# Patient Record
Sex: Female | Born: 1940 | Race: Black or African American | Hispanic: No | Smoking: Never smoker
Health system: Southern US, Community
[De-identification: ages and names within clinical notes are randomized; demographics above are authoritative.]

## PROBLEM LIST (undated history)

## (undated) DIAGNOSIS — Z9289 Personal history of other medical treatment: Secondary | ICD-10-CM

## (undated) DIAGNOSIS — I10 Essential (primary) hypertension: Secondary | ICD-10-CM

## (undated) DIAGNOSIS — J449 Chronic obstructive pulmonary disease, unspecified: Secondary | ICD-10-CM

## (undated) DIAGNOSIS — E119 Type 2 diabetes mellitus without complications: Secondary | ICD-10-CM

## (undated) DIAGNOSIS — E11610 Type 2 diabetes mellitus with diabetic neuropathic arthropathy: Secondary | ICD-10-CM

## (undated) DIAGNOSIS — Z7901 Long term (current) use of anticoagulants: Secondary | ICD-10-CM

## (undated) DIAGNOSIS — C801 Malignant (primary) neoplasm, unspecified: Secondary | ICD-10-CM

## (undated) DIAGNOSIS — I639 Cerebral infarction, unspecified: Secondary | ICD-10-CM

## (undated) DIAGNOSIS — R0602 Shortness of breath: Secondary | ICD-10-CM

## (undated) DIAGNOSIS — F419 Anxiety disorder, unspecified: Secondary | ICD-10-CM

## (undated) DIAGNOSIS — J189 Pneumonia, unspecified organism: Secondary | ICD-10-CM

## (undated) DIAGNOSIS — J45909 Unspecified asthma, uncomplicated: Secondary | ICD-10-CM

## (undated) DIAGNOSIS — G9340 Encephalopathy, unspecified: Secondary | ICD-10-CM

## (undated) DIAGNOSIS — I509 Heart failure, unspecified: Secondary | ICD-10-CM

## (undated) DIAGNOSIS — D696 Thrombocytopenia, unspecified: Secondary | ICD-10-CM

## (undated) DIAGNOSIS — M109 Gout, unspecified: Secondary | ICD-10-CM

## (undated) HISTORY — DX: Gout, unspecified: M10.9

## (undated) HISTORY — PX: OTHER SURGICAL HISTORY: SHX169

## (undated) HISTORY — PX: TUBAL LIGATION: SHX77

## (undated) HISTORY — DX: Long term (current) use of anticoagulants: Z79.01

---

## 2001-03-04 ENCOUNTER — Other Ambulatory Visit: Admission: RE | Admit: 2001-03-04 | Discharge: 2001-03-04 | Payer: Self-pay | Admitting: Obstetrics and Gynecology

## 2001-03-18 ENCOUNTER — Encounter: Payer: Self-pay | Admitting: Obstetrics and Gynecology

## 2001-03-18 ENCOUNTER — Ambulatory Visit (HOSPITAL_COMMUNITY): Admission: RE | Admit: 2001-03-18 | Discharge: 2001-03-18 | Payer: Self-pay | Admitting: Internal Medicine

## 2002-03-18 ENCOUNTER — Encounter: Payer: Self-pay | Admitting: Obstetrics and Gynecology

## 2002-03-18 ENCOUNTER — Ambulatory Visit (HOSPITAL_COMMUNITY): Admission: RE | Admit: 2002-03-18 | Discharge: 2002-03-18 | Payer: Self-pay | Admitting: Obstetrics and Gynecology

## 2002-04-02 ENCOUNTER — Ambulatory Visit (HOSPITAL_COMMUNITY): Admission: RE | Admit: 2002-04-02 | Discharge: 2002-04-02 | Payer: Self-pay | Admitting: Obstetrics and Gynecology

## 2002-04-02 ENCOUNTER — Encounter: Payer: Self-pay | Admitting: Obstetrics and Gynecology

## 2002-04-04 ENCOUNTER — Ambulatory Visit (HOSPITAL_COMMUNITY): Admission: RE | Admit: 2002-04-04 | Discharge: 2002-04-04 | Payer: Self-pay | Admitting: Internal Medicine

## 2002-04-04 HISTORY — PX: COLONOSCOPY: SHX174

## 2003-04-06 ENCOUNTER — Ambulatory Visit (HOSPITAL_COMMUNITY): Admission: RE | Admit: 2003-04-06 | Discharge: 2003-04-06 | Payer: Self-pay | Admitting: Obstetrics and Gynecology

## 2003-04-17 ENCOUNTER — Encounter: Admission: RE | Admit: 2003-04-17 | Discharge: 2003-04-17 | Payer: Self-pay | Admitting: Obstetrics and Gynecology

## 2003-04-17 ENCOUNTER — Encounter (INDEPENDENT_AMBULATORY_CARE_PROVIDER_SITE_OTHER): Payer: Self-pay | Admitting: *Deleted

## 2003-05-06 ENCOUNTER — Ambulatory Visit (HOSPITAL_COMMUNITY): Admission: RE | Admit: 2003-05-06 | Discharge: 2003-05-06 | Payer: Self-pay | Admitting: General Surgery

## 2003-05-23 HISTORY — PX: BREAST SURGERY: SHX581

## 2003-05-27 ENCOUNTER — Encounter: Admission: RE | Admit: 2003-05-27 | Discharge: 2003-05-27 | Payer: Self-pay | Admitting: Oncology

## 2003-05-27 ENCOUNTER — Encounter (HOSPITAL_COMMUNITY): Admission: RE | Admit: 2003-05-27 | Discharge: 2003-06-26 | Payer: Self-pay | Admitting: Oncology

## 2003-06-03 ENCOUNTER — Observation Stay (HOSPITAL_COMMUNITY): Admission: RE | Admit: 2003-06-03 | Discharge: 2003-06-04 | Payer: Self-pay | Admitting: General Surgery

## 2003-07-14 ENCOUNTER — Ambulatory Visit: Admission: RE | Admit: 2003-07-14 | Discharge: 2003-10-12 | Payer: Self-pay | Admitting: *Deleted

## 2003-11-09 ENCOUNTER — Encounter: Admission: RE | Admit: 2003-11-09 | Discharge: 2003-11-09 | Payer: Self-pay | Admitting: Oncology

## 2003-11-09 ENCOUNTER — Encounter (HOSPITAL_COMMUNITY): Admission: RE | Admit: 2003-11-09 | Discharge: 2003-12-09 | Payer: Self-pay | Admitting: Oncology

## 2004-05-10 ENCOUNTER — Encounter (HOSPITAL_COMMUNITY): Admission: RE | Admit: 2004-05-10 | Discharge: 2004-05-20 | Payer: Self-pay | Admitting: Oncology

## 2004-05-10 ENCOUNTER — Encounter: Admission: RE | Admit: 2004-05-10 | Discharge: 2004-05-20 | Payer: Self-pay | Admitting: Oncology

## 2004-05-10 ENCOUNTER — Ambulatory Visit (HOSPITAL_COMMUNITY): Payer: Self-pay | Admitting: Oncology

## 2004-05-17 ENCOUNTER — Encounter (HOSPITAL_COMMUNITY): Admission: RE | Admit: 2004-05-17 | Discharge: 2004-05-21 | Payer: Self-pay | Admitting: Oncology

## 2004-05-24 ENCOUNTER — Encounter (HOSPITAL_COMMUNITY): Admission: RE | Admit: 2004-05-24 | Discharge: 2004-06-29 | Payer: Self-pay | Admitting: Oncology

## 2004-05-25 ENCOUNTER — Encounter (HOSPITAL_COMMUNITY): Admission: RE | Admit: 2004-05-25 | Discharge: 2004-06-24 | Payer: Self-pay | Admitting: Oncology

## 2004-11-01 ENCOUNTER — Ambulatory Visit (HOSPITAL_COMMUNITY): Payer: Self-pay | Admitting: Oncology

## 2004-11-01 ENCOUNTER — Encounter (HOSPITAL_COMMUNITY): Admission: RE | Admit: 2004-11-01 | Discharge: 2004-12-01 | Payer: Self-pay | Admitting: Oncology

## 2004-11-01 ENCOUNTER — Encounter: Admission: RE | Admit: 2004-11-01 | Discharge: 2004-11-01 | Payer: Self-pay | Admitting: Oncology

## 2004-12-07 ENCOUNTER — Ambulatory Visit (HOSPITAL_COMMUNITY): Admission: RE | Admit: 2004-12-07 | Discharge: 2004-12-07 | Payer: Self-pay | Admitting: Obstetrics and Gynecology

## 2004-12-08 ENCOUNTER — Encounter (INDEPENDENT_AMBULATORY_CARE_PROVIDER_SITE_OTHER): Payer: Self-pay | Admitting: Specialist

## 2004-12-08 ENCOUNTER — Encounter: Admission: RE | Admit: 2004-12-08 | Discharge: 2004-12-08 | Payer: Self-pay | Admitting: Obstetrics and Gynecology

## 2005-06-02 ENCOUNTER — Ambulatory Visit (HOSPITAL_COMMUNITY): Admission: RE | Admit: 2005-06-02 | Discharge: 2005-06-02 | Payer: Self-pay | Admitting: Oncology

## 2005-06-05 ENCOUNTER — Ambulatory Visit (HOSPITAL_COMMUNITY): Payer: Self-pay | Admitting: Oncology

## 2005-06-05 ENCOUNTER — Encounter: Admission: RE | Admit: 2005-06-05 | Discharge: 2005-06-05 | Payer: Self-pay | Admitting: Oncology

## 2005-06-05 ENCOUNTER — Encounter (HOSPITAL_COMMUNITY): Admission: RE | Admit: 2005-06-05 | Discharge: 2005-07-05 | Payer: Self-pay | Admitting: Oncology

## 2005-11-29 ENCOUNTER — Ambulatory Visit (HOSPITAL_COMMUNITY): Admission: RE | Admit: 2005-11-29 | Discharge: 2005-11-29 | Payer: Self-pay | Admitting: Obstetrics and Gynecology

## 2006-01-16 ENCOUNTER — Encounter: Admission: RE | Admit: 2006-01-16 | Discharge: 2006-01-16 | Payer: Self-pay | Admitting: Oncology

## 2006-01-16 ENCOUNTER — Ambulatory Visit (HOSPITAL_COMMUNITY): Payer: Self-pay | Admitting: Oncology

## 2006-01-16 ENCOUNTER — Encounter (HOSPITAL_COMMUNITY): Admission: RE | Admit: 2006-01-16 | Discharge: 2006-02-15 | Payer: Self-pay | Admitting: Oncology

## 2006-07-23 ENCOUNTER — Ambulatory Visit (HOSPITAL_COMMUNITY): Payer: Self-pay | Admitting: Oncology

## 2006-12-05 ENCOUNTER — Ambulatory Visit (HOSPITAL_COMMUNITY): Admission: RE | Admit: 2006-12-05 | Discharge: 2006-12-05 | Payer: Self-pay | Admitting: Obstetrics and Gynecology

## 2006-12-24 ENCOUNTER — Ambulatory Visit (HOSPITAL_COMMUNITY): Payer: Self-pay | Admitting: Oncology

## 2007-03-25 ENCOUNTER — Other Ambulatory Visit: Admission: RE | Admit: 2007-03-25 | Discharge: 2007-03-25 | Payer: Self-pay | Admitting: Obstetrics and Gynecology

## 2007-04-09 ENCOUNTER — Encounter: Admission: RE | Admit: 2007-04-09 | Discharge: 2007-04-09 | Payer: Self-pay | Admitting: Internal Medicine

## 2007-10-21 ENCOUNTER — Ambulatory Visit (HOSPITAL_COMMUNITY): Payer: Self-pay | Admitting: Oncology

## 2007-12-09 ENCOUNTER — Encounter (HOSPITAL_COMMUNITY): Admission: RE | Admit: 2007-12-09 | Discharge: 2008-01-08 | Payer: Self-pay | Admitting: Oncology

## 2008-03-17 ENCOUNTER — Other Ambulatory Visit: Admission: RE | Admit: 2008-03-17 | Discharge: 2008-03-17 | Payer: Self-pay | Admitting: Obstetrics & Gynecology

## 2008-06-17 ENCOUNTER — Ambulatory Visit (HOSPITAL_COMMUNITY): Payer: Self-pay | Admitting: Oncology

## 2008-12-17 ENCOUNTER — Ambulatory Visit (HOSPITAL_COMMUNITY): Admission: RE | Admit: 2008-12-17 | Discharge: 2008-12-17 | Payer: Self-pay | Admitting: Obstetrics and Gynecology

## 2009-02-08 ENCOUNTER — Ambulatory Visit: Payer: Self-pay | Admitting: Cardiology

## 2009-03-22 ENCOUNTER — Other Ambulatory Visit: Admission: RE | Admit: 2009-03-22 | Discharge: 2009-03-22 | Payer: Self-pay | Admitting: Obstetrics & Gynecology

## 2009-07-07 ENCOUNTER — Encounter (HOSPITAL_COMMUNITY): Admission: RE | Admit: 2009-07-07 | Discharge: 2009-08-06 | Payer: Self-pay | Admitting: Oncology

## 2009-07-07 ENCOUNTER — Ambulatory Visit (HOSPITAL_COMMUNITY): Payer: Self-pay | Admitting: Oncology

## 2009-12-22 ENCOUNTER — Ambulatory Visit (HOSPITAL_COMMUNITY): Admission: RE | Admit: 2009-12-22 | Discharge: 2009-12-22 | Payer: Self-pay | Admitting: Obstetrics and Gynecology

## 2010-03-29 ENCOUNTER — Other Ambulatory Visit: Admission: RE | Admit: 2010-03-29 | Discharge: 2010-03-29 | Payer: Self-pay | Admitting: Obstetrics & Gynecology

## 2010-06-11 ENCOUNTER — Encounter: Payer: Self-pay | Admitting: Obstetrics and Gynecology

## 2010-06-12 ENCOUNTER — Encounter (HOSPITAL_COMMUNITY): Payer: Self-pay | Admitting: Oncology

## 2010-07-06 ENCOUNTER — Ambulatory Visit (HOSPITAL_COMMUNITY): Payer: Self-pay | Admitting: Oncology

## 2010-10-07 NOTE — Op Note (Signed)
NAME:  Brittany Clarke, Brittany Clarke                      ACCOUNT NO.:  [id number]   MEDICAL RECORD NO.:  [id number]                   PATIENT TYPE:  OBV   LOCATION:  A403                                 FACILITY:  [facility]   PHYSICIAN:  Barbaraann Barthel, M.D.              DATE OF BIRTH:  13-May-1941   DATE OF PROCEDURE:  06/03/2003  DATE OF DISCHARGE:                                 OPERATIVE REPORT   PREOPERATIVE DIAGNOSIS:  Carcinoma of the right breast.   POSTOPERATIVE DIAGNOSIS:  Carcinoma of the right breast.   PROCEDURE:  Sentinel lymph node biopsy and right axillary dissection.   SURGEON:  Barbaraann Barthel, M.D.   NOTE:  This is a 70 year old black female who was noted to have carcinoma in  situ by a core biopsy.  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  The margins were clear.  Sentinel lymph node biopsy was  requested.  We had discussed the procedure, in detail, with the patient  including complications, not limited to, but including:  Bleeding,  infection, allergic reaction, and the possibility that further surgery might  be required.  Informed consent was obtained.   GROSS OPERATIVE FINDINGS:  The patient had very difficult axilla to work  with due to her morbid obesity; however, we did obtain nodes by the sentinel  lymph node procedure that were negative for carcinoma.  The patient also had  an axillary dissection as there was some question in my mind whether or not  that the sentinel lymph nodes were accurate because we were having some  trouble with the neoprobe device.   SPECIMEN:  Axillary dissection and 4 different specimens. One containing 3  nodes and the others containing at least a node apiece; all of which were  negative for carcinoma.   TECHNIQUE:  The patient was placed in the supine position and after the  adequate administration of general anesthesia via endotracheal  intubation  her entire axilla was prepped with Betadine solution and draped in the usual  manner.  A limb isolator device was used.  Approximately 2 hours prior to  the procedure the patient had been injected in radiology with the  radioactive material; approximately 5 minutes before dissection the patient  had 5 cc of blue dye injected circumferentially, intradermally around the  biopsy site and this was massaged for approximately 5 minutes.  We then used  the neoprobe device in the axilla.  We did not find good counts in this area  due to her anatomy.  It was difficult to obtain good counts; however, we did  find a skin count of 220 x 10 to the third as a skin count and found lymph  nodes which registered 601 x10 to the third X vivo.  I removed these.  These  were sent for frozen section and found to be  negative. I then removed  further axillary tissue as I palpated some rather large nodes and I removed  these separately.  These were all negative for carcinoma as well.   After terminating the procedure, the wound was then irrigated with sterile  water solution and a Jackson-Pratt drain was placed in the right axilla via  a separate stab wound incision and the 3 incisions made in the right axilla  were  then closed with a stapling device.  Prior to closure all sponge, needle,  and instrument counts were found to be correct.  Estimated blood loss was  minimal.  The patient received approximately 1 liter of crystalloids  intraoperatively.  There were no complications.      ___________________________________________                                            Barbaraann Barthel, M.D.   WB/MEDQ  D:  06/03/2003  T:  06/03/2003  Job:  [id number]   cc:   Barbaraann Barthel, M.D.  [address]  [place]  [place] [address]  Fax: [phone]   Ladona Horns. Neijstrom, MD  [address]  [place]  [place] [address]  Fax: [phone]

## 2010-10-07 NOTE — Op Note (Signed)
NAME:  Brittany Clarke, Brittany Clarke                      ACCOUNT NO.:  [id number]   MEDICAL RECORD NO.:  [id number]                   PATIENT TYPE:  AMB   LOCATION:  DAY                                  FACILITY:  [facility]   PHYSICIAN:  R. Roetta Sessions, M.D.              DATE OF BIRTH:  02-26-1941   DATE OF PROCEDURE:  04/04/2002  DATE OF DISCHARGE:                                 OPERATIVE REPORT   PROCEDURE:  Colonoscopy with biopsy.   INDICATIONS FOR PROCEDURE:  The patient is a 70 year old lady sent over  through the courtesy of Dr. Christin Bach and associates for colorectal  cancer screening. She is devoid any lower GI tract symptoms. No prior  imaging of her colon and she has no family history of colorectal cancer. She  is followed primarily by Dr. Vivien Rossetti in [place]. Colonoscopy is now  being done as a standard screening maneuver. This approach has been  discussed with the patient at length. The potential risks, benefits, and  alternatives have been reviewed, questions answered. She is agreeable.  Please see my handwritten H&P for more information. ASA II.   MONITORING:  O2 saturation, blood pressure, pulse and respirations were  monitored throughout the entire procedure.   CONSCIOUS SEDATION:  Versed 4 mg IV, Demerol 75 mg in divided doses.   INSTRUMENT:  Olympus video chip adult colonoscope.   FINDINGS:  Digital rectal exam revealed no abnormalities.   ENDOSCOPIC FINDINGS:  The prep was good.   RECTUM:  Examination of the rectal mucosa including retroflexed view of the  anal verge revealed no internal hemorrhoids.   COLON:  The colonic mucosa was surveyed from the rectosigmoid junction  through the left transverse right colon to the area of the appendiceal  orifice, ileocecal valve and cecum. These structures were well seen and  photographed for the record. The ileocecal valve had a somewhat adenomatous  appearance but no definite polyp or neoplastic process was seen. This  may be  some eversion of the terminal ileum. I elected to biopsy this area. From  this level, the scope was slowly withdrawn. All previously mentioned mucosal  surfaces were again seen. No other abnormalities were observed aside from  the scattered left sided diverticula.  The patient tolerated the procedure  well and was reacted in endoscopy.   IMPRESSION:  1. Internal hemorrhoids, otherwise, normal rectum.  2. Scattered left sided diverticula.  3. Somewhat adenomatous appearing ileocecal valve which is probably normal     but this area was biopsied. The remainder of the colonic mucosa appeared     normal.    RECOMMENDATIONS:  1. Diverticulosis literature provided to Brittany Clarke.  2. Follow-up on path.  3. Further recommendations to follow.  Jonathon Bellows, M.D.    RMR/MEDQ  D:  04/04/2002  T:  04/04/2002  Job:  [id number]   cc:   Dr. Tillman Abide, Billey Gosling   Tilda Burrow, M.D.  [address] [place]  [place] [address]  Fax: [phone]

## 2010-10-07 NOTE — Op Note (Signed)
NAME:  Brittany Clarke, Brittany Clarke                      ACCOUNT NO.:  [id number]   MEDICAL RECORD NO.:  [id number]                   PATIENT TYPE:  AMB   LOCATION:  DAY                                  FACILITY:  [facility]   PHYSICIAN:  Barbaraann Barthel, M.D.              DATE OF BIRTH:  [birth year]   DATE OF PROCEDURE:  05/06/2003  DATE OF DISCHARGE:                                 OPERATIVE REPORT   SURGEON:  Barbaraann Barthel, M.D.   PREOPERATIVE DIAGNOSIS:  Abnormal right mammogram and ductal carcinoma in  situ of right breast.   POSTOPERATIVE DIAGNOSIS:  Abnormal right mammogram and ductal carcinoma in  situ of right breast.   PROCEDURE:  Needle localization x2 with right partial mastectomy.   INDICATIONS FOR PROCEDURE:  This is a 70 year old white female who had  undergone a stereotactic biopsy for abnormal mammogram on the right breast.  She was found to have ductal carcinoma in situ.  We have discussed with the  radiology department the need for needle localization and biopsy as well to  rule out an invasive carcinoma component.  We had discussed this  preoperatively with Dr. Kearney Hard and immediately preoperatively with Dr. Manson Passey.   GROSS OPERATIVE FINDINGS:  Two wires were placed by the radiology  department.  One was noted as being posterior.  This was located lateral.  The other one was noted as being anterior, and this was located as medial.  The posterior wire was further identified by surgery with a piece of tape  around it.  Specimen mammography revealed that some residual calcifications  of concern were all removed, and we also removed the intervening tissue  between the two locating wires as a separate specimen.   TECHNIQUE:  The patient was placed in the supine position.  After the  adequate administration of general anesthesia, her right hemithorax was  prepped with Betadine solution and draped in the usual manner.   An elliptical incision was carried out around each wire, and  this was  removed and sent as separate specimens, labeled as mentioned above.  The  wounds were then irrigated with sterile water.  Radiology informed us that  the area of interest was removed, with calcifications present within them.  She also asked for intervening tissue between the wires which we  accommodated and removed as well.  This was done widely around the area.  We  connected the two biopsy sites.  The wound was then irrigated with sterile  water, and bleeding was controlled with the cautery device.  The breast  tissue was approximated with 3-0 Polysorb, and the skin was approximated  with the stapling device.  Prior to closure, all sponge, needle, and  instrument counts were found to be correct.  Estimated blood loss was  minimal.  The patient received 900 cc of crystalloid intraoperatively.  No  drains were placed, and there were no complications.   ADDENDUM:  Prior to  this procedure, I discussed in detail this with the  family members and discussed the possibility of complications not limited to  but including bleeding, infection, and the possibility that further surgery  might be required.  Informed consent was obtained.      ___________________________________________                                            Barbaraann Barthel, M.D.   WB/MEDQ  D:  05/06/2003  T:  05/06/2003  Job:  [id number]   cc:   Tilda Burrow, M.D.  [address] [place]  [place] [address]  Fax: [phone]   Norva Pavlov, M.D.  [address]  [place]  [place] [address]  Fax: [phone]

## 2010-11-21 ENCOUNTER — Other Ambulatory Visit: Payer: Self-pay | Admitting: Obstetrics and Gynecology

## 2010-11-21 DIAGNOSIS — Z853 Personal history of malignant neoplasm of breast: Secondary | ICD-10-CM

## 2010-11-21 DIAGNOSIS — Z139 Encounter for screening, unspecified: Secondary | ICD-10-CM

## 2010-12-26 ENCOUNTER — Ambulatory Visit (HOSPITAL_COMMUNITY)
Admission: RE | Admit: 2010-12-26 | Discharge: 2010-12-26 | Disposition: A | Discharge status: Home / Self Care | Payer: No Typology Code available for payment source | Source: Ambulatory Visit | Attending: Obstetrics and Gynecology | Admitting: Obstetrics and Gynecology

## 2010-12-26 DIAGNOSIS — Z853 Personal history of malignant neoplasm of breast: Secondary | ICD-10-CM

## 2010-12-26 DIAGNOSIS — Z139 Encounter for screening, unspecified: Secondary | ICD-10-CM

## 2010-12-26 DIAGNOSIS — Z1231 Encounter for screening mammogram for malignant neoplasm of breast: Secondary | ICD-10-CM | POA: Insufficient documentation

## 2011-03-27 ENCOUNTER — Other Ambulatory Visit (HOSPITAL_COMMUNITY)
Admission: RE | Admit: 2011-03-27 | Discharge: 2011-03-27 | Disposition: A | Discharge status: Home / Self Care | Payer: Medicare (Managed Care) | Source: Ambulatory Visit | Attending: Obstetrics & Gynecology | Admitting: Obstetrics & Gynecology

## 2011-03-27 ENCOUNTER — Other Ambulatory Visit: Payer: Self-pay | Admitting: Obstetrics & Gynecology

## 2011-03-27 DIAGNOSIS — Z01419 Encounter for gynecological examination (general) (routine) without abnormal findings: Secondary | ICD-10-CM | POA: Insufficient documentation

## 2011-05-23 DIAGNOSIS — I639 Cerebral infarction, unspecified: Secondary | ICD-10-CM

## 2011-05-23 HISTORY — DX: Cerebral infarction, unspecified: I63.9

## 2011-12-07 ENCOUNTER — Other Ambulatory Visit: Payer: Self-pay | Admitting: Obstetrics and Gynecology

## 2011-12-07 DIAGNOSIS — Z139 Encounter for screening, unspecified: Secondary | ICD-10-CM

## 2012-01-01 ENCOUNTER — Ambulatory Visit (HOSPITAL_COMMUNITY): Admission: RE | Admit: 2012-01-01 | Payer: Medicaid Other | Source: Ambulatory Visit

## 2012-01-02 ENCOUNTER — Ambulatory Visit (HOSPITAL_COMMUNITY)
Admission: RE | Admit: 2012-01-02 | Discharge: 2012-01-02 | Disposition: A | Discharge status: Home / Self Care | Payer: Medicaid Other | Source: Ambulatory Visit | Attending: Obstetrics and Gynecology | Admitting: Obstetrics and Gynecology

## 2012-01-02 DIAGNOSIS — Z1231 Encounter for screening mammogram for malignant neoplasm of breast: Secondary | ICD-10-CM | POA: Insufficient documentation

## 2012-01-02 DIAGNOSIS — Z139 Encounter for screening, unspecified: Secondary | ICD-10-CM

## 2012-01-23 DIAGNOSIS — I6789 Other cerebrovascular disease: Secondary | ICD-10-CM

## 2012-06-10 ENCOUNTER — Encounter (HOSPITAL_COMMUNITY): Payer: Self-pay | Admitting: *Deleted

## 2012-06-10 ENCOUNTER — Emergency Department (HOSPITAL_COMMUNITY): Payer: Medicare Other

## 2012-06-10 ENCOUNTER — Inpatient Hospital Stay (HOSPITAL_COMMUNITY)
Admission: EM | Admit: 2012-06-10 | Discharge: 2012-06-12 | DRG: 194 | Disposition: A | Discharge status: Home Health | Payer: Medicare Other | Attending: Internal Medicine | Admitting: Internal Medicine

## 2012-06-10 DIAGNOSIS — I69959 Hemiplegia and hemiparesis following unspecified cerebrovascular disease affecting unspecified side: Secondary | ICD-10-CM

## 2012-06-10 DIAGNOSIS — I10 Essential (primary) hypertension: Secondary | ICD-10-CM | POA: Diagnosis present

## 2012-06-10 DIAGNOSIS — E876 Hypokalemia: Secondary | ICD-10-CM | POA: Diagnosis present

## 2012-06-10 DIAGNOSIS — E119 Type 2 diabetes mellitus without complications: Secondary | ICD-10-CM | POA: Diagnosis present

## 2012-06-10 DIAGNOSIS — J189 Pneumonia, unspecified organism: Principal | ICD-10-CM | POA: Diagnosis present

## 2012-06-10 DIAGNOSIS — M5137 Other intervertebral disc degeneration, lumbosacral region: Secondary | ICD-10-CM | POA: Diagnosis present

## 2012-06-10 DIAGNOSIS — Z853 Personal history of malignant neoplasm of breast: Secondary | ICD-10-CM

## 2012-06-10 DIAGNOSIS — Z6839 Body mass index (BMI) 39.0-39.9, adult: Secondary | ICD-10-CM

## 2012-06-10 DIAGNOSIS — Z7901 Long term (current) use of anticoagulants: Secondary | ICD-10-CM

## 2012-06-10 DIAGNOSIS — J449 Chronic obstructive pulmonary disease, unspecified: Secondary | ICD-10-CM | POA: Diagnosis present

## 2012-06-10 DIAGNOSIS — J441 Chronic obstructive pulmonary disease with (acute) exacerbation: Secondary | ICD-10-CM | POA: Diagnosis present

## 2012-06-10 DIAGNOSIS — I5040 Unspecified combined systolic (congestive) and diastolic (congestive) heart failure: Secondary | ICD-10-CM | POA: Diagnosis present

## 2012-06-10 DIAGNOSIS — I509 Heart failure, unspecified: Secondary | ICD-10-CM | POA: Diagnosis present

## 2012-06-10 DIAGNOSIS — M5136 Other intervertebral disc degeneration, lumbar region: Secondary | ICD-10-CM | POA: Diagnosis present

## 2012-06-10 DIAGNOSIS — F411 Generalized anxiety disorder: Secondary | ICD-10-CM | POA: Diagnosis present

## 2012-06-10 DIAGNOSIS — Z79899 Other long term (current) drug therapy: Secondary | ICD-10-CM

## 2012-06-10 DIAGNOSIS — M51379 Other intervertebral disc degeneration, lumbosacral region without mention of lumbar back pain or lower extremity pain: Secondary | ICD-10-CM | POA: Diagnosis present

## 2012-06-10 DIAGNOSIS — M109 Gout, unspecified: Secondary | ICD-10-CM | POA: Diagnosis present

## 2012-06-10 DIAGNOSIS — Z8673 Personal history of transient ischemic attack (TIA), and cerebral infarction without residual deficits: Secondary | ICD-10-CM

## 2012-06-10 DIAGNOSIS — R32 Unspecified urinary incontinence: Secondary | ICD-10-CM | POA: Diagnosis present

## 2012-06-10 DIAGNOSIS — K5900 Constipation, unspecified: Secondary | ICD-10-CM | POA: Diagnosis present

## 2012-06-10 DIAGNOSIS — J309 Allergic rhinitis, unspecified: Secondary | ICD-10-CM | POA: Diagnosis present

## 2012-06-10 DIAGNOSIS — D649 Anemia, unspecified: Secondary | ICD-10-CM | POA: Diagnosis present

## 2012-06-10 HISTORY — DX: Shortness of breath: R06.02

## 2012-06-10 HISTORY — DX: Heart failure, unspecified: I50.9

## 2012-06-10 HISTORY — DX: Pneumonia, unspecified organism: J18.9

## 2012-06-10 HISTORY — DX: Type 2 diabetes mellitus without complications: E11.9

## 2012-06-10 HISTORY — DX: Anxiety disorder, unspecified: F41.9

## 2012-06-10 HISTORY — DX: Chronic obstructive pulmonary disease, unspecified: J44.9

## 2012-06-10 HISTORY — DX: Malignant (primary) neoplasm, unspecified: C80.1

## 2012-06-10 HISTORY — DX: Essential (primary) hypertension: I10

## 2012-06-10 HISTORY — DX: Unspecified asthma, uncomplicated: J45.909

## 2012-06-10 HISTORY — DX: Cerebral infarction, unspecified: I63.9

## 2012-06-10 LAB — CBC WITH DIFFERENTIAL/PLATELET
Basophils Relative: 1 % (ref 0–1)
Eosinophils Relative: 1 % (ref 0–5)
HCT: 34.2 % — ABNORMAL LOW (ref 36.0–46.0)
Lymphs Abs: 1.7 10*3/uL (ref 0.7–4.0)
MCH: 30.6 pg (ref 26.0–34.0)
MCV: 93.4 fL (ref 78.0–100.0)
Monocytes Absolute: 0.9 10*3/uL (ref 0.1–1.0)
Monocytes Relative: 8 % (ref 3–12)
Neutro Abs: 8.2 10*3/uL — ABNORMAL HIGH (ref 1.7–7.7)
Platelets: 319 10*3/uL (ref 150–400)
RBC: 3.66 MIL/uL — ABNORMAL LOW (ref 3.87–5.11)

## 2012-06-10 LAB — COMPREHENSIVE METABOLIC PANEL
BUN: 8 mg/dL (ref 6–23)
CO2: 23 mEq/L (ref 19–32)
Calcium: 9.8 mg/dL (ref 8.4–10.5)
Chloride: 102 mEq/L (ref 96–112)
Creatinine, Ser: 0.47 mg/dL — ABNORMAL LOW (ref 0.50–1.10)
GFR calc Af Amer: >90 mL/min (ref >90)
GFR calc non Af Amer: >90 mL/min (ref >90)
Glucose, Bld: 86 mg/dL (ref 70–99)
Total Bilirubin: 0.2 mg/dL — ABNORMAL LOW (ref 0.3–1.2)

## 2012-06-10 LAB — TROPONIN I
Troponin I: <0.3 ng/mL (ref <0.30)
Troponin I: <0.3 ng/mL (ref <0.30)

## 2012-06-10 LAB — RETICULOCYTES
RBC.: 3.68 MIL/uL — ABNORMAL LOW (ref 3.87–5.11)
Retic Ct Pct: 1 % (ref 0.4–3.1)

## 2012-06-10 LAB — URINALYSIS, ROUTINE W REFLEX MICROSCOPIC
Hgb urine dipstick: NEGATIVE
Nitrite: NEGATIVE
Protein, ur: NEGATIVE mg/dL
Specific Gravity, Urine: 1.015 (ref 1.005–1.030)
Urobilinogen, UA: 0.2 mg/dL (ref 0.0–1.0)

## 2012-06-10 LAB — MAGNESIUM: Magnesium: 1.5 mg/dL (ref 1.5–2.5)

## 2012-06-10 LAB — PROTIME-INR: Prothrombin Time: 31.6 seconds — ABNORMAL HIGH (ref 11.6–15.2)

## 2012-06-10 LAB — LACTIC ACID, PLASMA: Lactic Acid, Venous: 1.4 mmol/L (ref 0.5–2.2)

## 2012-06-10 MED ORDER — INSULIN ASPART 100 UNIT/ML ~~LOC~~ SOLN
0.0000 [IU] | Freq: Three times a day (TID) | SUBCUTANEOUS | Status: DC
  Administered 2012-06-12: 2 [IU] via SUBCUTANEOUS

## 2012-06-10 MED ORDER — ONDANSETRON HCL 4 MG/2ML IJ SOLN: 4.0000 mg | Freq: Four times a day (QID) | INTRAMUSCULAR | Status: DC | PRN

## 2012-06-10 MED ORDER — FUROSEMIDE 10 MG/ML IJ SOLN
40.0000 mg | Freq: Four times a day (QID) | INTRAMUSCULAR | Status: DC
  Administered 2012-06-11 (×2): 40 mg via INTRAVENOUS
  Filled 2012-06-10 (×2): qty 4

## 2012-06-10 MED ORDER — POTASSIUM CHLORIDE CRYS ER 20 MEQ PO TBCR
40.0000 meq | EXTENDED_RELEASE_TABLET | Freq: Once | ORAL | Status: AC
  Administered 2012-06-10: 40 meq via ORAL
  Filled 2012-06-10: qty 2

## 2012-06-10 MED ORDER — SODIUM CHLORIDE 0.9 % IJ SOLN: 3.0000 mL | INTRAMUSCULAR | Status: DC | PRN

## 2012-06-10 MED ORDER — METOPROLOL TARTRATE 25 MG PO TABS
12.5000 mg | ORAL_TABLET | Freq: Two times a day (BID) | ORAL | Status: DC
  Administered 2012-06-11 – 2012-06-12 (×4): 12.5 mg via ORAL
  Filled 2012-06-10 (×4): qty 1

## 2012-06-10 MED ORDER — SODIUM CHLORIDE 0.9 % IV SOLN: 250.0000 mL | INTRAVENOUS | Status: DC | PRN

## 2012-06-10 MED ORDER — COLESEVELAM HCL 625 MG PO TABS
1250.0000 mg | ORAL_TABLET | Freq: Two times a day (BID) | ORAL | Status: DC
  Administered 2012-06-11 – 2012-06-12 (×3): 1250 mg via ORAL
  Filled 2012-06-10 (×7): qty 2

## 2012-06-10 MED ORDER — DEXTROSE 5 % IV SOLN
500.0000 mg | INTRAVENOUS | Status: DC
  Administered 2012-06-10 – 2012-06-11 (×2): 500 mg via INTRAVENOUS
  Filled 2012-06-10 (×2): qty 500

## 2012-06-10 MED ORDER — RALOXIFENE HCL 60 MG PO TABS
60.0000 mg | ORAL_TABLET | Freq: Every day | ORAL | Status: DC
Start: 2012-06-11 — End: 2012-06-12
  Administered 2012-06-11 – 2012-06-12 (×2): 60 mg via ORAL
  Filled 2012-06-10 (×2): qty 1

## 2012-06-10 MED ORDER — DEXTROSE 5 % IV SOLN: 500.0000 mg | INTRAVENOUS | Status: DC

## 2012-06-10 MED ORDER — POTASSIUM CHLORIDE 10 MEQ/100ML IV SOLN
10.0000 meq | INTRAVENOUS | Status: AC
  Administered 2012-06-10 – 2012-06-11 (×3): 10 meq via INTRAVENOUS
  Filled 2012-06-10: qty 100
  Filled 2012-06-10: qty 200

## 2012-06-10 MED ORDER — POLYETHYLENE GLYCOL 3350 17 G PO PACK
17.0000 g | PACK | Freq: Every day | ORAL | Status: DC
  Administered 2012-06-11: 17 g via ORAL
  Filled 2012-06-10 (×2): qty 1

## 2012-06-10 MED ORDER — SODIUM CHLORIDE 0.9 % IV SOLN
INTRAVENOUS | Status: DC
  Administered 2012-06-10: 18:00:00 via INTRAVENOUS

## 2012-06-10 MED ORDER — MONTELUKAST SODIUM 10 MG PO TABS
10.0000 mg | ORAL_TABLET | Freq: Every day | ORAL | Status: DC
  Administered 2012-06-11 (×2): 10 mg via ORAL
  Filled 2012-06-10 (×2): qty 1

## 2012-06-10 MED ORDER — DEXTROSE 5 % IV SOLN
1.0000 g | INTRAVENOUS | Status: DC
  Administered 2012-06-10 – 2012-06-11 (×2): 1 g via INTRAVENOUS
  Filled 2012-06-10 (×3): qty 10

## 2012-06-10 MED ORDER — LOSARTAN POTASSIUM 50 MG PO TABS
25.0000 mg | ORAL_TABLET | Freq: Every day | ORAL | Status: DC
  Administered 2012-06-11 – 2012-06-12 (×2): 25 mg via ORAL
  Filled 2012-06-10 (×2): qty 1

## 2012-06-10 MED ORDER — GABAPENTIN 400 MG PO CAPS
400.0000 mg | ORAL_CAPSULE | Freq: Every evening | ORAL | Status: DC
  Administered 2012-06-11 (×2): 400 mg via ORAL
  Filled 2012-06-10 (×2): qty 1

## 2012-06-10 MED ORDER — BACLOFEN 10 MG PO TABS
10.0000 mg | ORAL_TABLET | Freq: Three times a day (TID) | ORAL | Status: DC
  Administered 2012-06-10 – 2012-06-12 (×5): 10 mg via ORAL
  Filled 2012-06-10 (×5): qty 1

## 2012-06-10 MED ORDER — FUROSEMIDE 10 MG/ML IJ SOLN: 40.0000 mg | INTRAMUSCULAR | Status: DC

## 2012-06-10 MED ORDER — ALPRAZOLAM 0.25 MG PO TABS: 0.2500 mg | ORAL_TABLET | Freq: Two times a day (BID) | ORAL | Status: DC | PRN

## 2012-06-10 MED ORDER — SPIRONOLACTONE 25 MG PO TABS
12.5000 mg | ORAL_TABLET | Freq: Every day | ORAL | Status: DC
  Administered 2012-06-11 – 2012-06-12 (×2): 12.5 mg via ORAL
  Filled 2012-06-10 (×2): qty 1

## 2012-06-10 MED ORDER — MOMETASONE FURO-FORMOTEROL FUM 100-5 MCG/ACT IN AERO
2.0000 | INHALATION_SPRAY | Freq: Two times a day (BID) | RESPIRATORY_TRACT | Status: DC
  Administered 2012-06-11 – 2012-06-12 (×2): 2 via RESPIRATORY_TRACT
  Filled 2012-06-10: qty 8.8

## 2012-06-10 MED ORDER — MOMETASONE FURO-FORMOTEROL FUM 100-5 MCG/ACT IN AERO
INHALATION_SPRAY | RESPIRATORY_TRACT | Status: AC
  Filled 2012-06-10: qty 8.8

## 2012-06-10 MED ORDER — ACETAMINOPHEN 325 MG PO TABS: 650.0000 mg | ORAL_TABLET | ORAL | Status: DC | PRN

## 2012-06-10 MED ORDER — LORATADINE 10 MG PO TABS
10.0000 mg | ORAL_TABLET | Freq: Every day | ORAL | Status: DC
  Administered 2012-06-11 – 2012-06-12 (×2): 10 mg via ORAL
  Filled 2012-06-10 (×2): qty 1

## 2012-06-10 MED ORDER — HYDROCODONE-ACETAMINOPHEN 10-325 MG PO TABS
1.0000 | ORAL_TABLET | Freq: Four times a day (QID) | ORAL | Status: DC | PRN
  Administered 2012-06-11: 1 via ORAL
  Filled 2012-06-10: qty 1

## 2012-06-10 MED ORDER — LORAZEPAM 0.5 MG PO TABS
0.5000 mg | ORAL_TABLET | Freq: Every day | ORAL | Status: DC
  Administered 2012-06-11 (×2): 0.5 mg via ORAL
  Filled 2012-06-10 (×2): qty 1

## 2012-06-10 MED ORDER — TAMSULOSIN HCL 0.4 MG PO CAPS
0.4000 mg | ORAL_CAPSULE | Freq: Every day | ORAL | Status: DC
  Administered 2012-06-11 (×2): 0.4 mg via ORAL
  Filled 2012-06-10 (×2): qty 1

## 2012-06-10 MED ORDER — ALLOPURINOL 300 MG PO TABS
300.0000 mg | ORAL_TABLET | Freq: Every morning | ORAL | Status: DC
Start: 2012-06-11 — End: 2012-06-12
  Administered 2012-06-11 – 2012-06-12 (×2): 300 mg via ORAL
  Filled 2012-06-10 (×2): qty 1

## 2012-06-10 MED ORDER — DEXTROSE 5 % IV SOLN
INTRAVENOUS | Status: AC
  Filled 2012-06-10: qty 500

## 2012-06-10 NOTE — Progress Notes (Signed)
ANTIBIOTIC CONSULT NOTE - INITIAL  Pharmacy Consult for Renal Adjustment Antibiotics  Indication: pneumonia  Allergies  Allergen Reactions  . Ultram (Tramadol) Shortness Of Breath    Patient Measurements: Height: 5\' 6"  (167.6 cm) Weight: 240 lb 11.9 oz (109.2 kg) IBW/kg (Calculated) : 59.3   Vital Signs: Temp: 98.1 F (36.7 C) (01/20 2037) Temp src: Oral (01/20 2037) BP: 116/63 mmHg (01/20 2037) Pulse Rate: 97  (01/20 2037) Intake/Output from previous day:   Intake/Output from this shift:    Labs:  Basename 06/10/12 1707  WBC 11.0*  HGB 11.2*  PLT 319  LABCREA --  CREATININE 0.47*   Estimated Creatinine Clearance: 81.9 ml/min (by C-G formula based on Cr of 0.47). No results found for this basename: VANCOTROUGH:2,VANCOPEAK:2,VANCORANDOM:2,GENTTROUGH:2,GENTPEAK:2,GENTRANDOM:2,TOBRATROUGH:2,TOBRAPEAK:2,TOBRARND:2,AMIKACINPEAK:2,AMIKACINTROU:2,AMIKACIN:2, in the last 72 hours   Microbiology: No results found for this or any previous visit (from the past 720 hour(s)).  Medical History: Past Medical History  Diagnosis Date  . CHF (congestive heart failure)   . Diabetes mellitus without complication   . COPD (chronic obstructive pulmonary disease)   . Hypertension   . Asthma   . Shortness of breath   . Stroke   . Anxiety   . 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 Pneumonia     Medications:  Scheduled:    . allopurinol  300 mg Oral q morning - 10a  . azithromycin  500 mg Intravenous Q24H  . baclofen  10 mg Oral TID  . cefTRIAXone (ROCEPHIN)  IV  1 g Intravenous Q24H  . colesevelam  1,250 mg Oral BID WC  . furosemide  40 mg Intravenous Q4H  . gabapentin  400 mg Oral QPM  . insulin aspart  0-15 Units Subcutaneous TID WC  . loratadine  10 mg Oral Daily  . LORazepam  0.5 mg Oral QHS  . losartan  25 mg Oral Daily  . metoprolol tartrate  12.5 mg Oral BID  . mometasone-formoterol  2 puff Inhalation BID  . montelukast  10 mg Oral QHS  .  polyethylene glycol  17 g Oral Daily  . potassium chloride  10 mEq Intravenous Q1 Hr x 3  . [COMPLETED] potassium chloride  40 mEq Oral Once  . raloxifene  60 mg Oral Daily  . spironolactone  12.5 mg Oral Daily  . Tamsulosin HCl  0.4 mg Oral QHS  . [DISCONTINUED] azithromycin  500 mg Intravenous Q24H   Assessment: Ceftriaxone 1 GM IV every 24 hours Azithromycin 500 mg IV every 24 hours Started in ER  Goal of Therapy:  Eradicate infection  Plan:  No renal adjustment needed for present antibiotics Continue Ceftriaxone 1 GM IV every 24 hours Continue Azithromycin 500 mg IV every 24 hours  Brittany Clarke, Brittany Clarke 06/10/2012,9:56 PM

## 2012-06-10 NOTE — ED Notes (Signed)
Coughing for over a week

## 2012-06-10 NOTE — Progress Notes (Signed)
ANTICOAGULATION CONSULT NOTE - Initial Consult  Pharmacy Consult for Warfarin Indication: CVA   Allergies  Allergen Reactions  . Ultram (Tramadol) Shortness Of Breath    Patient Measurements: Height: 5\' 6"  (167.6 cm) Weight: 240 lb 11.9 oz (109.2 kg) IBW/kg (Calculated) : 59.3   Vital Signs: Temp: 98.1 F (36.7 C) (01/20 2037) Temp src: Oral (01/20 2037) BP: 116/63 mmHg (01/20 2037) Pulse Rate: 97  (01/20 2037)  Labs:  Basename 06/10/12 2144 06/10/12 1800 06/10/12 1707  HGB -- -- 11.2*  HCT -- -- 34.2*  PLT -- -- 319  APTT -- -- --  LABPROT 31.6* -- --  INR 3.28* -- --  HEPARINUNFRC -- -- --  CREATININE -- -- 0.47*  CKTOTAL -- -- --  CKMB -- -- --  TROPONINI <0.30 <0.30 --    Estimated Creatinine Clearance: 81.9 ml/min (by C-G formula based on Cr of 0.47).   Medical History: Past Medical History  Diagnosis Date  . CHF (congestive heart failure)   . Diabetes mellitus without complication   . COPD (chronic obstructive pulmonary disease)   . Hypertension   . Asthma   . Shortness of breath   . Stroke   . Anxiety   . 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 Pneumonia     Medications:  Scheduled:    . allopurinol  300 mg Oral q morning - 10a  . azithromycin  500 mg Intravenous Q24H  . baclofen  10 mg Oral TID  . cefTRIAXone (ROCEPHIN)  IV  1 g Intravenous Q24H  . colesevelam  1,250 mg Oral BID WC  . furosemide  40 mg Intravenous Q6H  . gabapentin  400 mg Oral QPM  . insulin aspart  0-15 Units Subcutaneous TID WC  . loratadine  10 mg Oral Daily  . LORazepam  0.5 mg Oral QHS  . losartan  25 mg Oral Daily  . metoprolol tartrate  12.5 mg Oral BID  . mometasone-formoterol  2 puff Inhalation BID  . montelukast  10 mg Oral QHS  . polyethylene glycol  17 g Oral Daily  . potassium chloride  10 mEq Intravenous Q1 Hr x 3  . [COMPLETED] potassium chloride  40 mEq Oral Once  . raloxifene  60 mg Oral Daily  . spironolactone  12.5 mg Oral  Daily  . Tamsulosin HCl  0.4 mg Oral QHS  . [DISCONTINUED] azithromycin  500 mg Intravenous Q24H  . [DISCONTINUED] furosemide  40 mg Intravenous Q4H    Assessment: Continuation of Warfarin from home Patient alternates 3.5 mg with 4 mg  INR 3.28 today, supra therapeutic  Goal of Therapy:  INR 2-3 Monitor platelets by anticoagulation protocol: Yes   Plan:  No Coumadin tonight due to elevated INR INR/PT daily Labs per protocol  Raquel James, Ymani Porcher Bennett 06/10/2012,10:44 PM

## 2012-06-10 NOTE — ED Notes (Signed)
Sent from her doctors office for evaluation of possible pneumonia

## 2012-06-10 NOTE — H&P (Signed)
[facility] History and Physical  Brittany Clarke:[id number] DOB: 1940/07/08 DOA: 06/10/2012  Referring physician: Leonel Ramsay PCP: Colon Branch, MD  Specialists: none  Chief Complaint: Cough, Dyspnea  HPI: Brittany Clarke is a 72 y.o. 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 The patient lives at home with family members who provide her with 24 7 care- she was recently released from [facility] skilled nursing facility where she completed a 3 month rehabilitation admission following a stroke that left her with a right hemiparesis. She is largely bed-bound, both by her stroke and by her morbid obesity which makes physical ability extremely difficult. Her daughter tells me that they're able to get her out of bed into a chair with full assistance and also onto a bedside commode and this is the extent of her physical activity. There are minimal medical records available in the [facility]-she has 2 family members at bedside one is her daughter who is an LPN and she tells me that she thinks she has a prior history of congestive heart failure but in general the patient and family have limited knowledge on her current medical problems.  In the emergency department her chest x-ray indicates pulmonary edema with an underlying infiltrate suspicious for pneumonia, he has an extremely elevated BNP, her medical history of COPD is questionable and on exam there is minimal wheezing. She complains of seasonal allergies and rhinorrhea. She denies any chest pain. No nausea vomiting diarrhea. No sick contacts and she was vaccinated for influenza. Hemodynamically she is stable on room air. Admission requested for treatment of community-acquired  pneumonia and congestive heart failure exacerbation.  Review of Systems:Review of Systems  Constitutional: Positive for malaise/fatigue. Negative for fever, chills, weight loss and diaphoresis.  HENT: Positive for congestion. Negative for nosebleeds and sore throat.   Eyes: Negative for blurred vision.  Respiratory: Positive for cough, sputum production and shortness of breath.   Cardiovascular: Positive for orthopnea and leg swelling. Negative for chest pain, palpitations and claudication.  Gastrointestinal: Positive for constipation. Negative for heartburn, nausea, vomiting, abdominal pain and diarrhea.  Genitourinary: Positive for frequency. Negative for dysuria, urgency, hematuria and flank pain.  Musculoskeletal: Positive for back pain. Negative for falls.  Skin: Negative for itching and rash.  Neurological: Positive for focal weakness and weakness. Negative for dizziness, sensory change, speech change and headaches.  Endo/Heme/Allergies: Negative.   Psychiatric/Behavioral: Negative for depression and substance abuse. The patient has insomnia. The patient is not nervous/anxious.   All other systems reviewed and are negative.     Past Medical History  Diagnosis Date  . CHF (congestive heart failure)   . Diabetes mellitus without complication   . COPD (chronic obstructive pulmonary disease)   . Hypertension   . Asthma   . Shortness of breath   . Stroke   . Anxiety   . 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 Pneumonia    Past Surgical History  Procedure Date  . Breast surgery   . Tubal ligation    Social History:  reports that she has never smoked. She does not have any smokeless tobacco history on file. She reports that she does not drink alcohol or use illicit drugs.  Patient lives at home with 24-hour caregiver family members. Recent discharge from [facility] have skilled nursing facility. She needs help with all ADLs.  Allergies  Allergen Reactions  .  Ultram (Tramadol) Shortness Of Breath    History reviewed. No pertinent family history. Family history significant for heart disease and diabetes.  Prior to Admission medications   Medication Sig Start Date End Date Taking? Authorizing Provider  acetaminophen (TYLENOL) 500 MG tablet Take 500 mg by mouth every 6 (six) hours as needed. For pain   Yes Historical Provider, MD  allopurinol (ZYLOPRIM) 300 MG tablet Take 300 mg by mouth every morning.   Yes Historical Provider, MD  amoxicillin (AMOXIL) 500 MG capsule Take 500 mg by mouth 3 (three) times daily. 06/08/12  Yes Historical Provider, MD  baclofen (LIORESAL) 10 MG tablet Take 10 mg by mouth 3 (three) times daily.   Yes Historical Provider, MD  benzonatate (TESSALON) 200 MG capsule Take 200 mg by mouth 3 (three) times daily as needed.   Yes Historical Provider, MD  colesevelam (WELCHOL) 625 MG tablet Take 1,250 mg by mouth 2 (two) times daily with a meal.   Yes Historical Provider, MD  Fluticasone-Salmeterol (ADVAIR) 250-50 MCG/DOSE AEPB Inhale 1 puff into the lungs every 12 (twelve) hours. RINSE AFTERWARDS   Yes Historical Provider, MD  gabapentin (NEURONTIN) 400 MG capsule Take 400 mg by mouth every evening.   Yes Historical Provider, MD  HYDROcodone-acetaminophen (NORCO/VICODIN) 5-325 MG per tablet Take 1 tablet by mouth at bedtime. For pain   Yes Historical Provider, MD  loratadine (CLARITIN) 10 MG tablet Take 10 mg by mouth daily.   Yes Historical Provider, MD  LORazepam (ATIVAN) 0.5 MG tablet Take 0.5 mg by mouth at bedtime.   Yes Historical Provider, MD  losartan (COZAAR) 25 MG tablet Take 25 mg by mouth daily.   Yes Historical Provider, MD  meclizine (ANTIVERT) 25 MG tablet Take 25 mg by mouth 3 (three) times daily as needed.   Yes Historical Provider, MD  metFORMIN (GLUCOPHAGE) 500 MG tablet Take 500 mg by mouth 2 (two) times daily with a meal.   Yes Historical Provider, MD  metoprolol tartrate (LOPRESSOR) 25 MG tablet Take 12.5 mg by  mouth 2 (two) times daily.   Yes Historical Provider, MD  montelukast (SINGULAIR) 10 MG tablet Take 10 mg by mouth at bedtime.   Yes Historical Provider, MD  polyethylene glycol (MIRALAX / GLYCOLAX) packet Take 17 g by mouth daily.   Yes Historical Provider, MD  raloxifene (EVISTA) 60 MG tablet Take 60 mg by mouth daily.   Yes Historical Provider, MD  Tamsulosin HCl (FLOMAX) 0.4 MG CAPS Take 0.4 mg by mouth every evening.   Yes Historical Provider, MD  warfarin (COUMADIN) 1 MG tablet Take 3.5 mg by mouth every other day. Alternating with 4mg  tablet every other day   Yes Historical Provider, MD  warfarin (COUMADIN) 4 MG tablet Take 4 mg by mouth every other day. Alternating with taking 3.5mg  (1mg  tablets used) every other day   Yes Historical Provider, MD   Physical Exam: Filed Vitals:   06/10/12 1747 06/10/12 1748 06/10/12 2037  BP: 134/54  116/63  Pulse:  90 97  Temp:  98.4 F (36.9 C) 98.1 F (36.7 C)  TempSrc:  Oral Oral  Resp:   20  Height:  5\' 6"  (1.676 m) 5\' 6"  (1.676 m)  Weight:  95.255 kg (210 lb) 109.2 kg (240 lb 11.9 oz)  SpO2:  94% 95%  General:  Alert oriented, pleasant and conversational, nontoxic-appearing but appears chronically ill with morbid obesity  Eyes: Normal  ENT: Poor dentition normal  Neck: Supple no adenopathy or enlarged thyroid, difficult to assess JVD based on her body habitus  Cardiovascular: Tachycardic, systolic ejection murmur 2/6  Respiratory: Crackles in bilateral bases but is moving air well, is not tachypneic and not using accessory muscles to breathe  Abdomen: Soft bowel sounds active  Skin: No rashes or lesions, 1+ pending edema in her lower extremities, technically limited edema exam due to her morbid obesity but there does not appear to be significant lower extremity lymphedema or changes of venous stasis. Pulses are 2+ and her dorsalis pedis  Musculoskeletal: Her right arm is propped up on several pillows, she can move her fingers  but the arm is very weak, no contractures or muscle atrophy  Psychiatric: Appropriate, patient has full capacity  Neurologic: Right arm weakness and mild right hemiparesis, otherwise nonfocal exam  Labs on Admission:  Basic Metabolic Panel:  Lab 06/10/12 4098  NA 139  K 3.0*  CL 102  CO2 23  GLUCOSE 86  BUN 8  CREATININE 0.47*  CALCIUM 9.8  MG --  PHOS --   Liver Function Tests:  Lab 06/10/12 1707  AST 18  ALT 10  ALKPHOS 69  BILITOT 0.2*  PROT 7.1  ALBUMIN 2.7*   No results found for this basename: LIPASE:5,AMYLASE:5 in the last 168 hours No results found for this basename: AMMONIA:5 in the last 168 hours CBC:  Lab 06/10/12 1707  WBC 11.0*  NEUTROABS 8.2*  HGB 11.2*  HCT 34.2*  MCV 93.4  PLT 319   Cardiac Enzymes:  Lab 06/10/12 1800  CKTOTAL --  CKMB --  CKMBINDEX --  TROPONINI <0.30    BNP (last 3 results)  Basename 06/10/12 1707  PROBNP 1330.0*   CBG:  Lab 06/10/12 2139  GLUCAP 77    Radiological Exams on Admission: Dg Chest 2 View  06/10/2012  *RADIOLOGY REPORT*  Clinical Data: Cough.  CHEST - 2 VIEW  Comparison: 01/24/2012  Findings: Low lung volumes are again noted.  There is asymmetric patchy airspace disease in the right upper lobe, suspicious for pneumonia.  Cardiomegaly is stable.  No definite evidence of congestive heart failure.  Airspace disease is also seen in the posterior lower lobe on the lateral projection, and right lower lobe pneumonia is also suspected. No pleural effusion identified.  IMPRESSION:  Right upper lobe airspace disease, suspicious for pneumonia. Posterior right lower lobe pneumonia also suspected on lateral radiograph. Post-treatment  radiographic followup recommended to confirm resolution.   Original Report Authenticated By: Myles Rosenthal, M.D.     JXB:[id number] at admission  Assessment/Plan Active Problems:  PNA (pneumonia)  Hypokalemia  CHF (congestive heart failure)  Morbid obesity  History of CVA  (cerebrovascular accident)  Anemia  DDD (degenerative disc disease), lumbar  Allergic rhinitis  Gout  HTN (hypertension)  DM (diabetes mellitus)  Constipation  Urinary incontinence  History of breast cancer   This is a 72 year old woman who has multiple chronic medical conditions- currently she is in mild respiratory failure related to a CHF exacerbation and probable superimposed pneumonia. Hemodynamically she is stable with a normal blood pressure and oxygen saturations in the low 90s on room air, at rest she is not dyspneic or tachypnea, she does have a frequent cough with sputum production. Additionally, she is afebrile but has an elevated white count and x-ray findings to suggest an infiltrate. Her  BNP is elevated there are no cardiology records for review there has been no 2-D echo or EKG done at this health system in the past. Based on her medication list she is on anticoagulation per-patient and the family this was started in the setting of her stroke but I do not see documentation of atrial fibrillation and EKG is pending at the time of this admission. On auscultation her heart sounded regular. No known history of coronary artery disease but has multple risk factors.   Full CHF evaluation: EKG, troponin, telemetry, 2-D echo, BNP  Acute CHF, mild treatment: Lasix 40 mg every 6 hours, strict I and O.'s daily weights etc. per CHF protocol, patient is already on an ARB and beta blocker, added spironolactone for additional diuresis and heart failure management, she is low-risk for hyperkalemia since at baseline she has low potassium at 3.0, will not start a vasodilator currently based on her blood pressure readings. Will follow her diuresis closely. BMET daily.  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   Code Status: I introduced the topic of advanced directives and discussed  determining CODE STATUS with both the patient and her daughter and son who were at bedside. I made a strong recommendation that given all of her chronic end-stage medical problems and her current very limited functional status that she consider DNR designation or at least Limited Code Status- especially as it would pertain to intubation/mechanical ventilation and chest compressions. She is "going to think about it". I also encouraged her to designate a HCPOA-according to her daughter there has in the past been similar discussions with providers but other family members "would not talk about it".  Family Communication: Discussed plans for admission and further evaluation and mangement with patient and family present in the room. Disposition Plan: Home when medically stable. Anticipate LOS 2-4 days.  Time spent: 70 minutes  [facility] Pager [phone]  If 7PM-7AM, please contact night-coverage [email or web address] Password [facility] 06/10/2012, 10:36 PM

## 2012-06-10 NOTE — ED Provider Notes (Signed)
History   This chart was scribed for Brittany Baker, MD by Charolett Bumpers, ED Scribe. The patient was seen in room APA01/APA01. Patient's care was started at 1658.   CSN: [id number]  Arrival date & time 06/10/12  1651   First MD Initiated Contact with Patient 06/10/12 1658      Chief Complaint  Patient presents with  . Cough    The history is provided by the patient and a relative. No language interpreter was used.   Brittany Clarke is a 72 y.o. female who presents to the Emergency Department complaining of a persistent, gradually worsening productive cough for the past week. Cough is productive with yellow phlegm. She reports associated SOB, chest soreness that is worse with coughing and fever. She states her cough is worse with laying flat. Family states that she saw Dr. Modesto Charon in [place] 2 days ago and had a chest x-ray that showed a possible pneumonia. She was started on Amoxicillin 2 days ago and followed up today in the office. She has not improved since starting the abx and was sent to ED for further evaluation. She has a h/o CVA and family states that she does not ambulate and cannot raise her RUE.   Past Medical History  Diagnosis Date  . CHF (congestive heart failure)   . Diabetes mellitus without complication     Past Surgical History  Procedure Date  . Breast surgery   . Tubal ligation     No family history on file.  History  Substance Use Topics  . Smoking status: Not on file  . Smokeless tobacco: Not on file  . Alcohol Use: No    OB History    Grav Para Term Preterm Abortions TAB SAB Ect Mult Living                  Review of Systems  Constitutional: Positive for fever.  Respiratory: Positive for cough and shortness of breath.   Cardiovascular: Positive for chest pain.  All other systems reviewed and are negative.    Allergies  Review of patient's allergies indicates no known allergies.  Home Medications  No current outpatient  prescriptions on file.  There were no vitals taken for this visit.  Physical Exam  Nursing note and vitals reviewed. Constitutional: She is oriented to person, place, and time. She appears well-developed and well-nourished.  Non-toxic appearance. No distress.  HENT:  Head: Normocephalic and atraumatic.  Eyes: Conjunctivae normal, EOM and lids are normal. Pupils are equal, round, and reactive to light.  Neck: Normal range of motion. Neck supple. No tracheal deviation present. No mass present.  Cardiovascular: Normal rate, regular rhythm and normal heart sounds.  Exam reveals no gallop.   No murmur heard. Pulmonary/Chest: Effort normal. No stridor. No respiratory distress. She has no decreased breath sounds. She has no wheezes. She has rhonchi. She has no rales.       Rhonchi bilaterally.  Abdominal: Soft. Normal appearance and bowel sounds are normal. She exhibits no distension. There is no tenderness. There is no rebound and no CVA tenderness.  Musculoskeletal: Normal range of motion. She exhibits no edema and no tenderness.  Neurological: She is alert and oriented to person, place, and time. She has normal strength. No cranial nerve deficit or sensory deficit. GCS eye subscore is 4. GCS verbal subscore is 5. GCS motor subscore is 6.       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  Skin: Skin  is warm and dry. No abrasion and no rash noted.  Psychiatric: She has a normal mood and affect. Her speech is normal and behavior is normal.    ED Course  Procedures (including critical care time)  COORDINATION OF CARE:  17:05-Discussed planned course of treatment with the patient and family including chest x-ray, blood work and UA, who is agreeable at this time.   Results for orders placed during the hospital encounter of 06/10/12  CBC WITH DIFFERENTIAL      Component Value Range   WBC 11.0 (*) 4.0 - 10.5 K/uL   RBC 3.66 (*) 3.87 - 5.11 MIL/uL   Hemoglobin 11.2 (*) 12.0 -  15.0 g/dL   HCT 16.1 (*) 09.6 - 04.5 %   MCV 93.4  78.0 - 100.0 fL   MCH 30.6  26.0 - 34.0 pg   MCHC 32.7  30.0 - 36.0 g/dL   RDW 40.9 (*) 81.1 - 91.4 %   Platelets 319  150 - 400 K/uL   Neutrophils Relative 75  43 - 77 %   Lymphocytes Relative 15  12 - 46 %   Monocytes Relative 8  3 - 12 %   Eosinophils Relative 1  0 - 5 %   Basophils Relative 1  0 - 1 %   Neutro Abs 8.2 (*) 1.7 - 7.7 K/uL   Lymphs Abs 1.7  0.7 - 4.0 K/uL   Monocytes Absolute 0.9  0.1 - 1.0 K/uL   Eosinophils Absolute 0.1  0.0 - 0.7 K/uL   Basophils Absolute 0.1  0.0 - 0.1 K/uL   WBC Morphology ATYPICAL LYMPHOCYTES      Dg Chest 2 View  06/10/2012  *RADIOLOGY REPORT*  Clinical Data: Cough.  CHEST - 2 VIEW  Comparison: 01/24/2012  Findings: Low lung volumes are again noted.  There is asymmetric patchy airspace disease in the right upper lobe, suspicious for pneumonia.  Cardiomegaly is stable.  No definite evidence of congestive heart failure.  Airspace disease is also seen in the posterior lower lobe on the lateral projection, and right lower lobe pneumonia is also suspected. No pleural effusion identified.  IMPRESSION:  Right upper lobe airspace disease, suspicious for pneumonia. Posterior right lower lobe pneumonia also suspected on lateral radiograph. Post-treatment  radiographic followup recommended to confirm resolution.   Original Report Authenticated By: Myles Rosenthal, M.D.      No diagnosis found.    MDM  Pt given potassium po and started on abx for CAP, will be admitted to [facility]    I personally performed the services described in this documentation, which was scribed in my presence. The recorded information has been reviewed and is accurate.       Brittany Baker, MD 06/10/12 [phone]

## 2012-06-11 DIAGNOSIS — I369 Nonrheumatic tricuspid valve disorder, unspecified: Secondary | ICD-10-CM

## 2012-06-11 DIAGNOSIS — I509 Heart failure, unspecified: Secondary | ICD-10-CM

## 2012-06-11 DIAGNOSIS — J449 Chronic obstructive pulmonary disease, unspecified: Secondary | ICD-10-CM

## 2012-06-11 LAB — BASIC METABOLIC PANEL
Chloride: 104 mEq/L (ref 96–112)
Creatinine, Ser: 0.47 mg/dL — ABNORMAL LOW (ref 0.50–1.10)
GFR calc Af Amer: >90 mL/min (ref >90)
GFR calc non Af Amer: >90 mL/min (ref >90)
Potassium: 3.8 mEq/L (ref 3.5–5.1)

## 2012-06-11 LAB — GLUCOSE, CAPILLARY
Glucose-Capillary: 107 mg/dL — ABNORMAL HIGH (ref 70–99)
Glucose-Capillary: 110 mg/dL — ABNORMAL HIGH (ref 70–99)
Glucose-Capillary: 104 mg/dL — ABNORMAL HIGH (ref 70–99)

## 2012-06-11 LAB — STREP PNEUMONIAE URINARY ANTIGEN: Strep Pneumo Urinary Antigen: POSITIVE — AB

## 2012-06-11 LAB — EXPECTORATED SPUTUM ASSESSMENT W GRAM STAIN, RFLX TO RESP C

## 2012-06-11 LAB — URINE CULTURE: Culture: NO GROWTH

## 2012-06-11 LAB — CBC
HCT: 30.9 % — ABNORMAL LOW (ref 36.0–46.0)
Platelets: 315 10*3/uL (ref 150–400)
RDW: 15.9 % — ABNORMAL HIGH (ref 11.5–15.5)
WBC: 12.6 10*3/uL — ABNORMAL HIGH (ref 4.0–10.5)

## 2012-06-11 LAB — VITAMIN B12: Vitamin B-12: 1397 pg/mL — ABNORMAL HIGH (ref 211–911)

## 2012-06-11 LAB — IRON AND TIBC
Saturation Ratios: 5 % — ABNORMAL LOW (ref 20–55)
TIBC: 224 ug/dL — ABNORMAL LOW (ref 250–470)
UIBC: 213 ug/dL (ref 125–400)

## 2012-06-11 LAB — INFLUENZA PANEL BY PCR (TYPE A & B)
H1N1 flu by pcr: NOT DETECTED
Influenza B By PCR: NEGATIVE

## 2012-06-11 LAB — TROPONIN I
Troponin I: <0.3 ng/mL (ref <0.30)
Troponin I: <0.3 ng/mL (ref <0.30)

## 2012-06-11 LAB — FERRITIN: Ferritin: 516 ng/mL — ABNORMAL HIGH (ref 10–291)

## 2012-06-11 LAB — TSH: TSH: 1.279 u[IU]/mL (ref 0.350–4.500)

## 2012-06-11 MED ORDER — GUAIFENESIN ER 600 MG PO TB12: 1200.0000 mg | ORAL_TABLET | Freq: Two times a day (BID) | ORAL | Status: DC | PRN

## 2012-06-11 MED ORDER — WARFARIN - PHARMACIST DOSING INPATIENT: Freq: Every day | Status: DC

## 2012-06-11 MED ORDER — FUROSEMIDE 10 MG/ML IJ SOLN
40.0000 mg | Freq: Three times a day (TID) | INTRAMUSCULAR | Status: DC
  Administered 2012-06-11 – 2012-06-12 (×2): 40 mg via INTRAVENOUS
  Filled 2012-06-11 (×3): qty 4

## 2012-06-11 MED ORDER — ALBUTEROL SULFATE (5 MG/ML) 0.5% IN NEBU: 2.5000 mg | INHALATION_SOLUTION | RESPIRATORY_TRACT | Status: DC | PRN

## 2012-06-11 MED ORDER — MAGNESIUM SULFATE 40 MG/ML IJ SOLN
2.0000 g | Freq: Once | INTRAMUSCULAR | Status: AC
  Administered 2012-06-11: 2 g via INTRAVENOUS
  Filled 2012-06-11: qty 50

## 2012-06-11 MED ORDER — BENZONATATE 100 MG PO CAPS
200.0000 mg | ORAL_CAPSULE | Freq: Two times a day (BID) | ORAL | Status: DC
  Administered 2012-06-11 – 2012-06-12 (×4): 200 mg via ORAL
  Filled 2012-06-11 (×4): qty 2

## 2012-06-11 MED ORDER — GUAIFENESIN ER 600 MG PO TB12
600.0000 mg | ORAL_TABLET | Freq: Two times a day (BID) | ORAL | Status: DC | PRN
  Administered 2012-06-11: 600 mg via ORAL
  Filled 2012-06-11: qty 1

## 2012-06-11 MED ORDER — GLUCERNA SHAKE PO LIQD: 237.0000 mL | Freq: Every day | ORAL | Status: DC

## 2012-06-11 MED ORDER — IPRATROPIUM BROMIDE 0.02 % IN SOLN: 0.5000 mg | Freq: Four times a day (QID) | RESPIRATORY_TRACT | Status: DC | PRN

## 2012-06-11 NOTE — Progress Notes (Signed)
INITIAL NUTRITION ASSESSMENT  DOCUMENTATION CODES Per approved criteria  -Obesity Unspecified   INTERVENTION: Glucerna Shake po HS, each supplement provides 220 kcal and 10 grams of protein.  NUTRITION DIAGNOSIS: Inadequate oral food intake related to decreased appetite as evidenced by hx of wt loss and poor PO intake on malnutrition screen.   Goal: 1) Pt will maintain current wt of 240# 2) Pt will meet >75% of estimated energy needs  Monitor:  PO intake, wt changes, labs, skin integrity, changes in status  Reason for Assessment:  MST=5  72 y.o. female  Admitting Dx: <principal problem not specified>  ASSESSMENT: Pt and family unavailable at times of multiple visits. Pt recently discharged to home (where she receives 24 hour care from family) from an SNF for rehab due to a recent stroke. Pt requires total care.  Chart reviewed.  Noted pt with 34# (12%) wt loss since 9/13, which is significant. Given hx of CHF, wt loss may be partly related to fluid loss.  No PO intake data currently available. Malnutrition screen data suggests decreased appetite. Pt is at risk for malnutrition given multiple comorbidities, decreased activity, and decreased appetite. Unable to dx malnutrition at this time, however.   Height: Ht Readings from Last 1 Encounters:  06/10/12 5\' 6"  (1.676 m)    Weight: Wt Readings from Last 1 Encounters:  06/11/12 240 lb 11.9 oz (109.2 kg)    Ideal Body Weight: 130#  % Ideal Body Weight: 185%  Wt Readings from Last 10 Encounters:  06/11/12 240 lb 11.9 oz (109.2 kg)    Usual Body Weight: unable to obtain  % Usual Body Weight: unable to obtain  BMI:  Body mass index is 38.86 kg/(m^2). Classified as obesity, class II.   Estimated Nutritional Needs: Kcal: 1600-1700 daily Protein: 87-109 grams daily Fluid: 1.6-1.7 L daily  Skin: WDL  Diet Order: Carb Control  EDUCATION NEEDS: -Education not appropriate at this time   Intake/Output Summary  (Last 24 hours) at 06/11/12 1346 Last data filed at 06/11/12 1345  Gross per 24 hour  Intake    551 ml  Output   2700 ml  Net  -2149 ml    Last BM: 06/09/12  Labs:   Lab 06/11/12 0400 06/10/12 2145 06/10/12 1707  NA 138 -- 139  K 3.8 -- 3.0*  CL 104 -- 102  CO2 26 -- 23  BUN 6 -- 8  CREATININE 0.47* -- 0.47*  CALCIUM 9.2 -- 9.8  MG -- 1.5 --  PHOS -- -- --  GLUCOSE 100* -- 86    CBG (last 3)   Basename 06/11/12 1139 06/11/12 0728 06/10/12 2139  GLUCAP 110* 107* 77    Scheduled Meds:   . allopurinol  300 mg Oral q morning - 10a  . azithromycin  500 mg Intravenous Q24H  . baclofen  10 mg Oral TID  . benzonatate  200 mg Oral BID  . cefTRIAXone (ROCEPHIN)  IV  1 g Intravenous Q24H  . colesevelam  1,250 mg Oral BID WC  . furosemide  40 mg Intravenous Q6H  . gabapentin  400 mg Oral QPM  . insulin aspart  0-15 Units Subcutaneous TID WC  . loratadine  10 mg Oral Daily  . LORazepam  0.5 mg Oral QHS  . losartan  25 mg Oral Daily  . metoprolol tartrate  12.5 mg Oral BID  . mometasone-formoterol  2 puff Inhalation BID  . montelukast  10 mg Oral QHS  . polyethylene glycol  17  g Oral Daily  . raloxifene  60 mg Oral Daily  . spironolactone  12.5 mg Oral Daily  . Tamsulosin HCl  0.4 mg Oral QHS  . Warfarin - Pharmacist Dosing Inpatient   Does not apply q1800    Continuous Infusions:   . sodium chloride 20 mL/hr at 06/10/12 1825    Past Medical History  Diagnosis Date  . CHF (congestive heart failure)   . Diabetes mellitus without complication   . COPD (chronic obstructive pulmonary disease)   . Hypertension   . Asthma   . Shortness of breath   . Stroke   . Anxiety   . 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 Pneumonia     Past Surgical History  Procedure Date  . Breast surgery   . Tubal ligation     Melody Haver, RD, LDN Pager: [phone]

## 2012-06-11 NOTE — Progress Notes (Signed)
ANTICOAGULATION CONSULT NOTE  Pharmacy Consult for Warfarin Indication: hx CVA   Allergies  Allergen Reactions  . Ultram (Tramadol) Shortness Of Breath    Patient Measurements: Height: 5\' 6"  (167.6 cm) Weight: 240 lb 11.9 oz (109.2 kg) IBW/kg (Calculated) : 59.3   Vital Signs: Temp: 98.4 F (36.9 C) (01/21 0418) Temp src: Oral (01/21 0418) BP: 115/70 mmHg (01/21 0418) Pulse Rate: 97  (01/21 0418)  Labs:  Basename 06/11/12 0500 06/11/12 0400 06/10/12 2144 06/10/12 1800 06/10/12 1707  HGB -- 10.2* -- -- 11.2*  HCT -- 30.9* -- -- 34.2*  PLT -- 315 -- -- 319  APTT -- -- -- -- --  LABPROT -- 32.6* 31.6* -- --  INR -- 3.42* 3.28* -- --  HEPARINUNFRC -- -- -- -- --  CREATININE -- 0.47* -- -- 0.47*  CKTOTAL -- -- -- -- --  CKMB -- -- -- -- --  TROPONINI <0.30 -- <0.30 <0.30 --    Estimated Creatinine Clearance: 81.9 ml/min (by C-G formula based on Cr of 0.47).   Medical History: Past Medical History  Diagnosis Date  . CHF (congestive heart failure)   . Diabetes mellitus without complication   . COPD (chronic obstructive pulmonary disease)   . Hypertension   . Asthma   . Shortness of breath   . Stroke   . Anxiety   . 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 Pneumonia     Medications:  Scheduled:     . allopurinol  300 mg Oral q morning - 10a  . azithromycin  500 mg Intravenous Q24H  . baclofen  10 mg Oral TID  . benzonatate  200 mg Oral BID  . cefTRIAXone (ROCEPHIN)  IV  1 g Intravenous Q24H  . colesevelam  1,250 mg Oral BID WC  . furosemide  40 mg Intravenous Q6H  . gabapentin  400 mg Oral QPM  . insulin aspart  0-15 Units Subcutaneous TID WC  . loratadine  10 mg Oral Daily  . LORazepam  0.5 mg Oral QHS  . losartan  25 mg Oral Daily  . [COMPLETED] magnesium sulfate 1 - 4 g bolus IVPB  2 g Intravenous Once  . metoprolol tartrate  12.5 mg Oral BID  . mometasone-formoterol  2 puff Inhalation BID  . montelukast  10 mg Oral QHS  .  polyethylene glycol  17 g Oral Daily  . [COMPLETED] potassium chloride  10 mEq Intravenous Q1 Hr x 3  . [COMPLETED] potassium chloride  40 mEq Oral Once  . raloxifene  60 mg Oral Daily  . spironolactone  12.5 mg Oral Daily  . Tamsulosin HCl  0.4 mg Oral QHS  . [DISCONTINUED] azithromycin  500 mg Intravenous Q24H  . 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  INR continues to rise despite holding dose last night. No bleeding noted.   Goal of Therapy:  INR 2-3   Plan:  No Coumadin tonight due to elevated INR INR/PT daily  Alyiah Ulloa, [facility] 06/11/2012,8:37 AM

## 2012-06-11 NOTE — Evaluation (Signed)
Physical Therapy Evaluation Patient Details Name: Brittany Clarke MRN: [id number] DOB: 1941/04/17 Today's Date: 06/11/2012 Time: 9562-1308 PT Time Calculation (min): 28 min  PT Assessment / Plan / Recommendation Clinical Impression  Pt is a 72 year old female referred to [facility] for pna after being d/c from a SNF for stroke w/R sided hemiplegia.  At this time she is motivated to move, however requires significant A due to generalized weakness and morbid obesity.  She is able to independently instruct caregivers on appropriate placement to decrease chance of wounds.      PT Assessment  Patient needs continued PT services    Follow Up Recommendations  SNF    Does the patient have the potential to tolerate intense rehabilitation      Barriers to Discharge Decreased caregiver support Pt requires max-total A for transfers    Equipment Recommendations       Recommendations for Other Services     Frequency Min 3X/week    Precautions / Restrictions     Pertinent Vitals/Pain FACES: 6/10 to R UE w/movement      Mobility  Bed Mobility Bed Mobility: Sitting - Scoot to Edge of Bed;Supine to Sit;Sit to Sidelying Left;Scooting to HOB Supine to Sit: HOB elevated;1: +1 Total assist Sitting - Scoot to Edge of Bed: With rail;2: Max assist Sit to Sidelying Left: 2: Max assist;HOB flat;With rail Scooting to [facility]: 2: Max assist Details for Bed Mobility Assistance: Pt attempts to use LLE to help with bed mobiity.  Requires max A for getting into bed secondary to requires A for B LE Transfers Transfers: Sit to Stand;Stand to Sit Sit to Stand: 1: +2 Total assist;From elevated surface;From bed Sit to Stand: Patient Percentage: 60% Stand to Sit: To elevated surface;To bed;1: +1 Total assist Details for Transfer Assistance: significant posterior lean, places majority of weight to LLE secondary to increased fear of RLE x5x for activity tolerance. (x3 w/RW x2 w/o to attempt  transfer) Ambulation/Gait Ambulation/Gait Assistance: Not tested (comment) General Gait Details: Pt was not ambulating before. Stairs: No    Shoulder Instructions     Exercises     PT Diagnosis: Generalized weakness  PT Problem List: Decreased strength;Decreased activity tolerance;Decreased balance;Decreased mobility PT Treatment Interventions: Functional mobility training;Therapeutic activities;Therapeutic exercise;Balance training   PT Goals Acute Rehab PT Goals PT Goal Formulation: With patient Time For Goal Achievement: 06/18/12 Potential to Achieve Goals: Fair Pt will go Supine/Side to Sit: with mod assist;with rail;with HOB not 0 degrees (comment degree) PT Goal: Supine/Side to Sit - Progress: Goal set today Pt will go Sit to Supine/Side: with mod assist;with HOB not 0 degrees (comment degree);with rail PT Goal: Sit to Supine/Side - Progress: Goal set today Pt will go Sit to Stand: with max assist;from elevated surface;with upper extremity assist PT Goal: Sit to Stand - Progress: Goal set today Pt will go Stand to Sit: with max assist;with upper extremity assist;to elevated surface PT Goal: Stand to Sit - Progress: Goal set today Pt will Transfer Bed to Chair/Chair to Bed: with max assist PT Transfer Goal: Bed to Chair/Chair to Bed - Progress: Goal set today Pt will Stand: with mod assist;with max assist;3 - 5 min (to decrease secondary chance of skin breakdown) PT Goal: Stand - Progress: Goal set today  Visit Information  Last PT Received On: 06/11/12 PT/OT Co-Evaluation/Treatment: Yes    Subjective Data  Subjective: I want to be able to go back home with my family. I have been doing rehab at  a facility in [place].   Prior Functioning  Home Living Lives With: Family Type of Home: Mobile home Home Access: Ramped entrance Home Layout: One level Bathroom Shower/Tub: Engineer, manufacturing systems: Handicapped height Home Adaptive Equipment: Bedside  commode/3-in-1;Wheelchair - manual;Walker - rolling;Hospital bed Additional Comments: currently taking a sponge bath.  Prior Function Level of Independence: Needs assistance Needs Assistance: Engineer, maintenance (IT);Bathing;Dressing;Toileting;Meal Prep Bath: Maximal Dressing: Maximal Toileting: Maximal Meal Prep: Maximal Light Housekeeping: Maximal Transfer Assistance: requires "a lot of help" to transfer.   Able to Take Stairs?: No Driving: No Vocation: Retired Musician: No difficulties    Cognition       Extremity/Trunk Assessment Right Lower Extremity Assessment RLE ROM/Strength/Tone: Deficits RLE ROM/Strength/Tone Deficits: hip flexion 1/5, knee extension 3/5; knee flexion 3/5; hip abduction: 0/5, hip adduction 2/5   Balance Balance Balance Assessed: Yes Static Sitting Balance Static Sitting - Comment/# of Minutes: sits EOB x8 minutes throghout session for RB between STS activities. with max A to supervision  Static Standing Balance Static Standing - Comment/# of Minutes: 5x10-15 sec holds.  Cueing for posture w/max A   End of Session PT - End of Session Equipment Utilized During Treatment: Gait belt Activity Tolerance: Patient limited by fatigue Patient left: in bed Nurse Communication: Mobility status  GP     Brittany Clarke 06/11/2012, 3:41 PM

## 2012-06-11 NOTE — Care Management Note (Deleted)
    Page 1 of 1   06/11/2012     1:43:45 PM   CARE MANAGEMENT NOTE 06/11/2012  Patient:  Brittany Clarke, Brittany Clarke   Account Number:  [id number]  Date Initiated:  06/11/2012  Documentation initiated by:  Rosemary Holms  Subjective/Objective Assessment:   Pt admitted from home. States she has a CAP aide approved but she has not started yet. Pt agrees to [facility] RN to work with her regarding CHF     Action/Plan:   Anticipated DC Date:  06/13/2012   Anticipated DC Plan:  HOME W HOME HEALTH SERVICES      DC Planning Services  CM consult      [facility] Choice  HOME HEALTH   Choice offered to / List presented to:  C-1 Patient        HH arranged  HH-1 RN  HH-10 DISEASE MANAGEMENT      HH agency  [facility]   Status of service:  In process, will continue to follow Medicare Important Message given?   (If response is "NO", the following Medicare IM given date fields will be blank) Date Medicare IM given:   Date Additional Medicare IM given:    Discharge Disposition:    Per UR Regulation:    If discussed at Long Length of Stay Meetings, dates discussed:    Comments:  06/11/12 Rosemary Holms RN BSN CM

## 2012-06-11 NOTE — Evaluation (Signed)
Occupational Therapy Evaluation Patient Details Name: LAKASHIA COLLISON MRN: [id number] DOB: [birth year] Today's Date: 06/11/2012 Time: 3086-5784 OT Time Calculation (min): 23 min  OT Assessment / Plan / Recommendation Clinical Impression  Pt is a 72 year old female referred to [facility] for pna after being d/c from a SNF for stroke w/R sided hemiplegia.  She is motivated to move, however requires significant A due to generalized weakness and morbid obesity. Patient will benefit from OT services to increase strength and endurance.  Recommend SNF at D/C.    OT Assessment  Patient needs continued OT Services    Follow Up Recommendations  SNF    Barriers to Discharge Decreased caregiver support Max-Total A needed for care.  Equipment Recommendations  None recommended by OT       Frequency  Min 2X/week    Precautions / Restrictions Precautions Precautions: Fall   Pertinent Vitals/Pain FACES 6 R UE with movement.    ADL  Lower Body Dressing: Performed;+1 Total assistance Where Assessed - Lower Body Dressing: Supine, head of bed up    OT Diagnosis: Generalized weakness  OT Problem List: Decreased strength;Decreased range of motion;Decreased activity tolerance;Impaired balance (sitting and/or standing);Impaired UE functional use;Pain OT Treatment Interventions: Therapeutic exercise;Self-care/ADL training;Energy conservation;Therapeutic activities;Patient/family education;Balance training   OT Goals Acute Rehab OT Goals OT Goal Formulation: With patient ADL Goals Pt Will Perform Grooming: with set-up;Sitting, edge of bed ADL Goal: Grooming - Progress: Goal set today Arm Goals Pt Will Perform AROM: with supervision, verbal cues required/provided;to maintain range of motion;Left upper extremity;1 set;10 reps Arm Goal: AROM - Progress: Goal set today Additional Arm Goal #1: patient will perform Bil UE strengthening exercises to increase strength; LUE; 1 set; 10 reps. Arm Goal: Additional  Goal #1 - Progress: Goal set today Miscellaneous OT Goals Miscellaneous OT Goal #1: Patient will tolerate sitting on EOB for 10' while performing a table top activity to increase sitting tolerance and core strength. OT Goal: Miscellaneous Goal #1 - Progress: Goal set today  Visit Information  Last OT Received On: 06/11/12 Assistance Needed: +2 PT/OT Co-Evaluation/Treatment: Yes    Subjective Data  Subjective: "I want to go back home." Patient Stated Goal: To go home.   Prior Functioning     Home Living Lives With: Family Type of Home: Mobile home Home Access: Ramped entrance Home Layout: One level Bathroom Shower/Tub: Engineer, manufacturing systems: Handicapped height Home Adaptive Equipment: Bedside commode/3-in-1;Wheelchair - manual;Walker - rolling;Hospital bed Additional Comments: currently taking a sponge bath.  Prior Function Level of Independence: Needs assistance Needs Assistance: Engineer, maintenance (IT);Bathing;Dressing;Toileting;Meal Prep Bath: Maximal Dressing: Maximal Toileting: Maximal Meal Prep: Maximal Light Housekeeping: Maximal Transfer Assistance: requires "a lot of help" to transfer.   Able to Take Stairs?: No Driving: No Vocation: Retired Musician: No difficulties         Vision/Perception     Cognition  Overall Cognitive Status: Appears within functional limits for tasks assessed/performed Arousal/Alertness: Awake/alert Orientation Level: Appears intact for tasks assessed Behavior During Session: [facility] for tasks performed    Extremity/Trunk Assessment Right Upper Extremity Assessment RUE ROM/Strength/Tone: Deficits RUE ROM/Strength/Tone Deficits: 0/5 due to right hemiparesis. Pain with passive movement. 1 finger subluxation noted in shoulder region. Left Upper Extremity Assessment LUE ROM/Strength/Tone: Deficits LUE ROM/Strength/Tone Deficits: 3/5 shoulder flexion, elbow flexion, elbow extension. weak grasp.      Mobility Bed Mobility Bed Mobility: Sitting - Scoot to Edge of Bed;Supine to Sit;Sit to Sidelying Left;Scooting to HOB Supine to Sit: HOB elevated;1: +1 Total  assist Sitting - Scoot to Edge of Bed: With rail;2: Max assist Sit to Sidelying Left: 2: Max assist;HOB flat;With rail Scooting to [facility]: 2: Max assist Details for Bed Mobility Assistance: Pt attempts to use LLE to help with bed mobiity.  Requires max A for getting into bed secondary to requires A for B LE Transfers Sit to Stand: 1: +2 Total assist;From elevated surface;From bed Sit to Stand: Patient Percentage: 60% Stand to Sit: To elevated surface;To bed;1: +1 Total assist Details for Transfer Assistance: significant posterior lean, places majority of weight to LLE secondary to increased fear of RLE x5x for activity tolerance. (x3 w/RW x2 w/o to attempt transfer)           Balance Balance Balance Assessed: Yes Static Sitting Balance Static Sitting - Comment/# of Minutes: sits EOB x8 minutes throghout session for RB between STS activities. with max A to supervision  Static Standing Balance Static Standing - Comment/# of Minutes: 5x10-15 sec holds.  Cueing for posture w/max A    End of Session OT - End of Session Equipment Utilized During Treatment: Gait belt Activity Tolerance: Patient tolerated treatment well Patient left: in bed;Other (comment) (with PT)    Limmie Patricia, OTR/L 06/11/2012, 4:23 PM

## 2012-06-11 NOTE — Care Management Note (Addendum)
    Page 1 of 2   06/12/2012     4:00:11 PM   CARE MANAGEMENT NOTE 06/12/2012  Patient:  Brittany Clarke, Brittany Clarke   Account Number:  [id number]  Date Initiated:  06/11/2012  Documentation initiated by:  Rosemary Holms  Subjective/Objective Assessment:   Pt admitted from home. States she has a CAP aide approved but she has not started yet. Pt agrees to [facility] RN to work with her regarding CHF     Action/Plan:   Anticipated DC Date:  06/13/2012   Anticipated DC Plan:  HOME W HOME HEALTH SERVICES      DC Planning Services  CM consult      [facility] Choice  HOME HEALTH   Choice offered to / List presented to:  C-1 Patient   DME arranged  [facility]      DME agency  [facility]     HH arranged  HH-1 RN  HH-10 DISEASE MANAGEMENT  HH-2 PT  HH-5 SPEECH THERAPY      HH agency  [facility]   Status of service:  Completed, signed off Medicare Important Message given?   (If response is "NO", the following Medicare IM given date fields will be blank) Date Medicare IM given:   Date Additional Medicare IM given:    Discharge Disposition:  HOME W HOME HEALTH SERVICES  Per UR Regulation:    If discussed at Long Length of Stay Meetings, dates discussed:    Comments:  06/12/12 Rosemary Holms RN BSN CM [facility] will follow pt on outpt basis. Dr. Modesto Charon is new PCP. Alroy Bailiff, [facility] notified of referral and blood work needing to be drawn on 06/14/12. 1600 called [facility] advising of continuous O2 order and pt DC today  06/11/12 Rosemary Holms RN BSN CM referral made to [facility] for community case management.

## 2012-06-11 NOTE — Progress Notes (Signed)
*  PRELIMINARY RESULTS* Echocardiogram 2D Echocardiogram has been performed.  Conrad Declo 06/11/2012, 10:28 AM

## 2012-06-11 NOTE — Progress Notes (Signed)
UR Chart Review Completed  

## 2012-06-11 NOTE — Clinical Social Work Psychosocial (Signed)
Clinical Social Work Department BRIEF PSYCHOSOCIAL ASSESSMENT 06/11/2012  Patient:  Brittany Clarke, Brittany Clarke     Account Number:  [id number]     Admit date:  06/10/2012  Clinical Social Worker:  Nancie Neas  Date/Time:  06/11/2012 04:05 PM  Referred by:  CSW  Date Referred:  06/11/2012 Referred for  SNF Placement   Other Referral:   Interview type:  Patient Other interview type:    PSYCHOSOCIAL DATA Living Status:  ALONE Admitted from facility:   Level of care:   Primary support name:  Elease Hashimoto Primary support relationship to patient:  CHILD, ADULT Degree of support available:   supportive    CURRENT CONCERNS Current Concerns  Post-Acute Placement   Other Concerns:    SOCIAL WORK ASSESSMENT / PLAN CSW met with pt at bedside following referral from PT for SNF. Pt alert and oriented and reports she technically lives alone, but has a CAP aid for 8 hours a day and her children rotate staying with her the rest of the time. She has 5 children, 4 living locally and describes them as supportive. Pt has had a stroke with right hemiparesis. She went to [facility] and was d/c recently from there after completing rehab. Pt feels she has been managing well at home. She said her son is able to lift her to a chair and her other children use a lift. She uses [facility] for transportation to appointments. Pt feels she has everything she needs at home. CSW discussed PT recommendation of SNF. Pt states that she wants to return home at d/c as she has around the clock care and she feels she is at her baseline.   Assessment/plan status:  Referral to [facility] Other assessment/ plan:   Information/referral to community resources:   CM for possible home health    PATIENT'S/FAMILY'S RESPONSE TO PLAN OF CARE: Pt reports no needs at this time and is not interested in returning to SNF. CSW signing off but can be reconsulted if needed.        Derenda Fennel, [place] [phone]

## 2012-06-11 NOTE — Progress Notes (Signed)
[facility] PROGRESS NOTE  Brittany Clarke ZOX:[id number] DOB: 02/17/1941 DOA: 06/10/2012 PCP: Colon Branch, MD  Assessment/Plan:  Mixed diastolic and systolic heart failure 2D echo results listed below serial troponins remained within normal limits  BNP minimally elevated at 1330 She is diuresed 2.8 L so far this admission Will continue with Lasix and spironolactone (will decrease Lasix dose to q. 8 hours).  Monitor bmet closely Already on losartan and metoprolol Recommend followup with cardiology after discharge She will be discharged with home health nurse for CHF management.  Community acquired pneumonia  WBC climbing slightly today to 12.6 Non productive cough. Increase mucolytics. The patient has a low-grade fever of 99.9 Blood cultures pending continue with IV Rocephin and azithromycin  Mild Hypokalemia  resolved. repleted potassium with IV and by mouth potassium   Normocytic anemia  Check anemia panel   allergic rhinitis  Continue home medications  Chronic anticoagulation  Coumadin per pharmacy Indication previous CVA  Diabetes CBGs  stable in house on sliding scale insulin moderate  Actos has been discontinued as it is contraindicated with CHF Recommend resuming metformin at discharge   All other home meds were restarted at admission for her chronic disease management including allopurinol for gout prophylaxis and her LABA-steroid inhaler for questionable COPD or asthma.    Code Status: Currently a full code. Patient and family encouraged to consider DO NOT RESUSCITATE designation or at least limited CODE STATUS Family Communication:  Disposition Plan: Likely to home when able.  PT eval requested.    Procedures: 2-D echocardiogram Study Conclusions  - Left ventricle: The cavity size was normal. Wall was increased in a pattern of mild LVH. Systolic function was mildly to moderately reduced. The estimated ejection fraction was in the range of  40% to 45%. Probable hypokinesis of the distalanteroseptal myocardium. There is hypokinesis of the inferior myocardium. Doppler parameters are consistent with abnormal left ventricular relaxation (grade 1 diastolic dysfunction). No previous study for comparison. - Aortic valve: Mildly calcified annulus. Trileaflet. No significant regurgitation. - Aortic root: The aortic root was mildly dilated. - Mitral valve: Calcified annulus. - Left atrium: The atrium was mildly dilated. - Right ventricle: Systolic function was mildly reduced. - Tricuspid valve: Mild regurgitation. - Pericardium, extracardiac: There was no pericardial effusion.  Antibiotics:  Azithromycin and Rocephin started at admission  HPI/Subjective: She reports feeling better. Still appears to be having difficulty breathing particularly when speaking and managing her secretions.  Objective: Filed Vitals:   06/10/12 2037 06/11/12 0418 06/11/12 0749 06/11/12 1300  BP: 116/63 115/70  119/72  Pulse: 97 97  118  Temp: 98.1 F (36.7 C) 98.4 F (36.9 C)  99.9 F (37.7 C)  TempSrc: Oral Oral  Oral  Resp: 20 18  20   Height: 5\' 6"  (1.676 m)     Weight: 109.2 kg (240 lb 11.9 oz) 109.2 kg (240 lb 11.9 oz)    SpO2: 95% 95% 96% 90%    Intake/Output Summary (Last 24 hours) at 06/11/12 1352 Last data filed at 06/11/12 1345  Gross per 24 hour  Intake    551 ml  Output   3400 ml  Net  -2849 ml   Filed Weights   06/10/12 1748 06/10/12 2037 06/11/12 0418  Weight: 95.255 kg (210 lb) 109.2 kg (240 lb 11.9 oz) 109.2 kg (240 lb 11.9 oz)    Exam:   General:  Alert and oriented, lying in bed, short of breath when speaking  Cardiovascular: Regular rate and rhythm, bilateral lower  extremity edema  Respiratory: Decreased breath sounds, no wheeze or crackle, mild accessory muscle movement, there is an element of working to breathe particularly when speaking  Abdomen: Obese, soft, nontender, nondistended, no masses  Extremities:  Patient has a history of right-sided extremity weakness from previous stroke. All 4 extremities appear fluid overloaded. Her lower extremities in particular demonstrate fluid overload right slightly greater than left. Her edema does not pit.  Data Reviewed: Basic Metabolic Panel:  Lab 06/11/12 6578 06/10/12 2145 06/10/12 1707  NA 138 -- 139  K 3.8 -- 3.0*  CL 104 -- 102  CO2 26 -- 23  GLUCOSE 100* -- 86  BUN 6 -- 8  CREATININE 0.47* -- 0.47*  CALCIUM 9.2 -- 9.8  MG -- 1.5 --  PHOS -- -- --   Liver Function Tests:  Lab 06/10/12 1707  AST 18  ALT 10  ALKPHOS 69  BILITOT 0.2*  PROT 7.1  ALBUMIN 2.7*   CBC:  Lab 06/11/12 0400 06/10/12 1707  WBC 12.6* 11.0*  NEUTROABS -- 8.2*  HGB 10.2* 11.2*  HCT 30.9* 34.2*  MCV 93.1 93.4  PLT 315 319   Cardiac Enzymes:  Lab 06/11/12 1118 06/11/12 0500 06/10/12 2144 06/10/12 1800  CKTOTAL -- -- -- --  CKMB -- -- -- --  CKMBINDEX -- -- -- --  TROPONINI <0.30 <0.30 <0.30 <0.30   BNP (last 3 results)  Basename 06/10/12 1707  PROBNP 1330.0*   CBG:  Lab 06/11/12 1139 06/11/12 0728 06/10/12 2139  GLUCAP 110* 107* 77    Recent Results (from the past 240 hour(s))  CULTURE, BLOOD (ROUTINE X 2)     Status: Normal (Preliminary result)   Collection Time   06/10/12  5:25 PM      Component Value Range Status Comment   Specimen Description BLOOD LEFT HAND   Final    Special Requests     Final    Value: BOTTLES DRAWN AEROBIC AND ANAEROBIC AEB=5CC ANA=4CC   Culture NO GROWTH 1 DAY   Final    Report Status PENDING   Incomplete   CULTURE, BLOOD (ROUTINE X 2)     Status: Normal (Preliminary result)   Collection Time   06/10/12  6:10 PM      Component Value Range Status Comment   Specimen Description BLOOD LEFT ARM   Final    Special Requests BOTTLES DRAWN AEROBIC ONLY 4CC   Final    Culture NO GROWTH 1 DAY   Final    Report Status PENDING   Incomplete   CULTURE, EXPECTORATED SPUTUM-ASSESSMENT     Status: Normal   Collection Time    06/11/12  1:18 AM      Component Value Range Status Comment   Specimen Description SPUTUM   Final    Special Requests NONE   Final    Sputum evaluation     Final    Value: THIS SPECIMEN IS ACCEPTABLE. RESPIRATORY CULTURE REPORT TO FOLLOW.   Report Status 06/11/2012 FINAL   Final      Studies: Dg Chest 2 View  06/10/2012  *RADIOLOGY REPORT*  Clinical Data: Cough.  CHEST - 2 VIEW  Comparison: 01/24/2012  Findings: Low lung volumes are again noted.  There is asymmetric patchy airspace disease in the right upper lobe, suspicious for pneumonia.  Cardiomegaly is stable.  No definite evidence of congestive heart failure.  Airspace disease is also seen in the posterior lower lobe on the lateral projection, and right lower lobe pneumonia is also  suspected. No pleural effusion identified.  IMPRESSION:  Right upper lobe airspace disease, suspicious for pneumonia. Posterior right lower lobe pneumonia also suspected on lateral radiograph. Post-treatment  radiographic followup recommended to confirm resolution.   Original Report Authenticated By: Myles Rosenthal, M.D.     Scheduled Meds:   . allopurinol  300 mg Oral q morning - 10a  . azithromycin  500 mg Intravenous Q24H  . baclofen  10 mg Oral TID  . benzonatate  200 mg Oral BID  . cefTRIAXone (ROCEPHIN)  IV  1 g Intravenous Q24H  . colesevelam  1,250 mg Oral BID WC  . furosemide  40 mg Intravenous Q6H  . gabapentin  400 mg Oral QPM  . insulin aspart  0-15 Units Subcutaneous TID WC  . loratadine  10 mg Oral Daily  . LORazepam  0.5 mg Oral QHS  . losartan  25 mg Oral Daily  . metoprolol tartrate  12.5 mg Oral BID  . mometasone-formoterol  2 puff Inhalation BID  . montelukast  10 mg Oral QHS  . polyethylene glycol  17 g Oral Daily  . raloxifene  60 mg Oral Daily  . spironolactone  12.5 mg Oral Daily  . Tamsulosin HCl  0.4 mg Oral QHS  . Warfarin - Pharmacist Dosing Inpatient   Does not apply q1800   Continuous Infusions:   . sodium chloride 20  mL/hr at 06/10/12 1825    Active Problems:  PNA (pneumonia)  Hypokalemia  CHF (congestive heart failure)  Morbid obesity  History of CVA (cerebrovascular accident)  Anemia  DDD (degenerative disc disease), lumbar  Allergic rhinitis  Gout  HTN (hypertension)  DM (diabetes mellitus)  Constipation  Urinary incontinence  History of breast cancer  COPD (chronic obstructive pulmonary disease)    Time spent: 40 minutes    Conley Canal  [facility] Pager [phone]. If 8PM-8AM, please contact night-coverage at [email or web address], password [facility] 06/11/2012, 1:52 PM  LOS: 1 day    Attending note:  Chart reviewed. Patient interviewed and examined independently. She is feeling better. She has worked with physical therapy. Agree with above.  Crista Curb, M.D.

## 2012-06-12 DIAGNOSIS — E119 Type 2 diabetes mellitus without complications: Secondary | ICD-10-CM

## 2012-06-12 LAB — CBC
HCT: 31.1 % — ABNORMAL LOW (ref 36.0–46.0)
MCH: 30.6 pg (ref 26.0–34.0)
MCHC: 33.1 g/dL (ref 30.0–36.0)
MCV: 92.3 fL (ref 78.0–100.0)
Platelets: 356 10*3/uL (ref 150–400)
RDW: 15.7 % — ABNORMAL HIGH (ref 11.5–15.5)

## 2012-06-12 LAB — LEGIONELLA ANTIGEN, URINE

## 2012-06-12 LAB — BASIC METABOLIC PANEL
BUN: 8 mg/dL (ref 6–23)
Calcium: 9.3 mg/dL (ref 8.4–10.5)
Creatinine, Ser: 0.56 mg/dL (ref 0.50–1.10)
GFR calc Af Amer: >90 mL/min (ref >90)

## 2012-06-12 LAB — GLUCOSE, CAPILLARY
Glucose-Capillary: 118 mg/dL — ABNORMAL HIGH (ref 70–99)
Glucose-Capillary: 135 mg/dL — ABNORMAL HIGH (ref 70–99)

## 2012-06-12 MED ORDER — POTASSIUM CHLORIDE CRYS ER 20 MEQ PO TBCR
40.0000 meq | EXTENDED_RELEASE_TABLET | Freq: Two times a day (BID) | ORAL | Status: DC
  Administered 2012-06-12: 40 meq via ORAL
  Filled 2012-06-12: qty 2

## 2012-06-12 MED ORDER — GLUCERNA SHAKE PO LIQD: 237.0000 mL | Freq: Three times a day (TID) | ORAL | Status: DC

## 2012-06-12 MED ORDER — GLUCERNA SHAKE PO LIQD
237.0000 mL | Freq: Three times a day (TID) | ORAL | Status: DC
  Administered 2012-06-12: 237 mL via ORAL

## 2012-06-12 MED ORDER — CEFUROXIME AXETIL 250 MG PO TABS: 500.0000 mg | ORAL_TABLET | Freq: Two times a day (BID) | ORAL | Status: DC

## 2012-06-12 MED ORDER — WARFARIN SODIUM 2.5 MG PO TABS: 2.5000 mg | ORAL_TABLET | Freq: Every day | ORAL | Status: DC

## 2012-06-12 MED ORDER — FUROSEMIDE 20 MG PO TABS: 20.0000 mg | ORAL_TABLET | Freq: Every day | ORAL | Status: DC | PRN

## 2012-06-12 MED ORDER — GUAIFENESIN ER 600 MG PO TB12: 1200.0000 mg | ORAL_TABLET | Freq: Two times a day (BID) | ORAL | Status: DC | PRN

## 2012-06-12 MED ORDER — ALBUTEROL SULFATE (5 MG/ML) 0.5% IN NEBU: 2.5000 mg | INHALATION_SOLUTION | RESPIRATORY_TRACT | Status: DC | PRN

## 2012-06-12 MED ORDER — FUROSEMIDE 10 MG/ML IJ SOLN: 40.0000 mg | Freq: Every day | INTRAMUSCULAR | Status: DC

## 2012-06-12 MED ORDER — WARFARIN SODIUM 1 MG PO TABS: 3.0000 mg | ORAL_TABLET | Freq: Once | ORAL | Status: DC

## 2012-06-12 MED ORDER — FUROSEMIDE 20 MG PO TABS: 20.0000 mg | ORAL_TABLET | Freq: Every day | ORAL | Status: DC

## 2012-06-12 MED ORDER — CEFUROXIME AXETIL 500 MG PO TABS: 500.0000 mg | ORAL_TABLET | Freq: Two times a day (BID) | ORAL | Status: DC

## 2012-06-12 NOTE — Progress Notes (Signed)
Physical Therapy Treatment Patient Details Name: Brittany Clarke MRN: [id number] DOB: 12-04-1940 Today's Date: 06/12/2012 Time: 0830-0905 PT Time Calculation (min): 35 min  PT Assessment / Plan / Recommendation Comments on Treatment Session  Therex for LE strengthening and max A +1 with transfer training for STS.  Pt able to A with L LEbut required max A with R UE and LE.  Pt limited by fatigue with.activity.  No reports of pain.    Follow Up Recommendations        Does the patient have the potential to tolerate intense rehabilitation     Barriers to Discharge        Equipment Recommendations       Recommendations for Other Services    Frequency     Plan      Precautions / Restrictions Precautions Precautions: Fall Restrictions Weight Bearing Restrictions: No   Pertinent Vitals/Pain     Mobility  Bed Mobility Bed Mobility: Left Sidelying to Sit;Sitting - Scoot to [employer] of Bed;Scooting to [facility] Left Sidelying to Sit: 3: Mod assist (cueng for hand p\lacement A with rolling, max A R UE/LE) Sitting - Scoot to Edge of Bed: 2: Max assist;With rail Sit to Sidelying Left: 2: Max assist;HOB flat Scooting to HOB: 2: Max assist Details for Bed Mobility Assistance: Pt attempts to use LLE to help with bed mobiity. Requires max A for getting into bed secondary to requires A for R UE and LE Transfers Transfers: Sit to Stand;Stand to Sit Sit to Stand: 1: +2 Total assist;2: Max assist Stand to Sit: 2: Max assist;1: +1 Total assist;To bed Ambulation/Gait Ambulation/Gait Assistance: Not tested (comment)    Exercises General Exercises - Lower Extremity Ankle Circles/Pumps: AROM;Both;10 reps;Supine Quad Sets: AROM;Both;10 reps;Supine Gluteal Sets: AROM;Both;10 reps Heel Slides: AAROM;Both;5 reps;Supine Hip ABduction/ADduction: AROM;Both;10 reps;Supine   PT Diagnosis:    PT Problem List:   PT Treatment Interventions:     PT Goals Acute Rehab PT Goals PT Goal: Supine/Side to Sit -  Progress: Progressing toward goal PT Goal: Sit to Supine/Side - Progress: Progressing toward goal PT Goal: Sit to Stand - Progress: Progressing toward goal PT Goal: Stand to Sit - Progress: Progressing toward goal PT Transfer Goal: Bed to Chair/Chair to Bed - Progress: Progressing toward goal PT Goal: Stand - Progress: Progressing toward goal  Visit Information  Last PT Received On: 06/12/12    Subjective Data  Subjective: I am fine, just feel really weak.     Cognition  Overall Cognitive Status: Appears within functional limits for tasks assessed/performed Arousal/Alertness: Awake/alert Orientation Level: Appears intact for tasks assessed Behavior During Session: [facility] for tasks performed    Balance     End of Session PT - End of Session Equipment Utilized During Treatment: Gait belt Activity Tolerance: Patient limited by fatigue Patient left: in bed;with call bell/phone within reach Nurse Communication: Mobility status   GP     Juel Burrow 06/12/2012, 9:12 AM

## 2012-06-12 NOTE — Progress Notes (Signed)
Thank you to Rosemary Holms RN CM for this referral.  Patient admitted to [facility] on 1.20.14 for PNA.   Patient evaluated for community based chronic disease management services with [facility] Care Management Program as a benefit of patient's [facility] Medicare Insurance.  Spoke with patient and multiple family members at bedside to explain [facility] Care Management services. She recently changed her PCP from Dr Virgina Organ to Dr Modesto Charon. Family expressed concern with the patient being able to get to her PCP office for sick visits.  Notified RN CM, Algis Downs PA, and RN Staff Nurse of PCP change.  Due to her inability to stand and pivot she can not go by car.  Currently she uses [facility] and must give at least 24 hour notice for appointments.  [facility] will provide a LCSW referral to explore other options for same day and next day sick visits.  Reported to [facility] PA that the patient is weak, has a low hemoglobin,  and complains that she is fearful to eat a number of foods.  This is due to swallowing difficulties post CVA and loss of appetite.  PA ordered Glucerna supplements and added a home health ST evaluation.  Patient has been going to outpatient rehabilitation prior to admission.  This has been taxing to her physically and she agrees that in her current condition she can not resume therapy outside her home.  Reported finding to PA and a home health PT evaluation has been ordered.  Patient has accepted services and consents have been obtained.  Home contact information was verified.  Patient will receive a post discharge transition of care call and will be evaluated for monthly home visits for assessments and disease process education. Left contact information and [facility] literature with her daughter Amadi Yoshino.  Family request the her primary contact, Laverta Baltimore [phone], receive her the transition of care call.  Instructed the patient and family that Mrs Freida Busman would need to have a copy of the discharge  instruction sheet in her possession at the time of the call to ensure that thorough information is obtained during this assessment.  Family voiced understanding.  Made inpatient Case Manager, Rosemary Holms RN, aware of assessment, barriers, and anticipated interventions. Of note, [facility] Care Management services does not replace or interfere with any services that are arranged by inpatient case management or social work.  For additional questions or referrals please contact Anibal Henderson BSN RN [facility] Liaison at [phone].

## 2012-06-12 NOTE — Clinical Social Work Note (Signed)
CSW arranged transport via [facility] for d/c home. Pt verified address and that family would be present upon arrival. No other needs reported.  Derenda Fennel, [place] [phone]

## 2012-06-12 NOTE — Progress Notes (Signed)
ANTICOAGULATION CONSULT NOTE  Pharmacy Consult for Warfarin Indication: hx CVA   Allergies  Allergen Reactions  . Ultram (Tramadol) Shortness Of Breath   Patient Measurements: Height: 5\' 6"  (167.6 cm) Weight: 244 lb 9.6 oz (110.95 kg) IBW/kg (Calculated) : 59.3   Vital Signs: Temp: 98.9 F (37.2 C) (01/22 0544) Temp src: Oral (01/22 0544) BP: 114/45 mmHg (01/22 0929) Pulse Rate: 115  (01/22 0929)  Labs:  Basename 06/12/12 0440 06/11/12 1118 06/11/12 0500 06/11/12 0400 06/10/12 2144 06/10/12 1707  HGB 10.3* -- -- 10.2* -- --  HCT 31.1* -- -- 30.9* -- 34.2*  PLT 356 -- -- 315 -- 319  APTT -- -- -- -- -- --  LABPROT 27.3* -- -- 32.6* 31.6* --  INR 2.69* -- -- 3.42* 3.28* --  HEPARINUNFRC -- -- -- -- -- --  CREATININE 0.56 -- -- 0.47* -- 0.47*  CKTOTAL -- -- -- -- -- --  CKMB -- -- -- -- -- --  TROPONINI -- <0.30 <0.30 -- <0.30 --   Estimated Creatinine Clearance: 82.6 ml/min (by C-G formula based on Cr of 0.56).  Medical History: Past Medical History  Diagnosis Date  . CHF (congestive heart failure)   . Diabetes mellitus without complication   . COPD (chronic obstructive pulmonary disease)   . Hypertension   . Asthma   . Shortness of breath   . Stroke   . Anxiety   . 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 Pneumonia    Medications:  Scheduled:     . allopurinol  300 mg Oral q morning - 10a  . baclofen  10 mg Oral TID  . benzonatate  200 mg Oral BID  . cefTRIAXone (ROCEPHIN)  IV  1 g Intravenous Q24H  . colesevelam  1,250 mg Oral BID WC  . feeding supplement  237 mL Oral QHS  . gabapentin  400 mg Oral QPM  . insulin aspart  0-15 Units Subcutaneous TID WC  . loratadine  10 mg Oral Daily  . LORazepam  0.5 mg Oral QHS  . losartan  25 mg Oral Daily  . metoprolol tartrate  12.5 mg Oral BID  . mometasone-formoterol  2 puff Inhalation BID  . montelukast  10 mg Oral QHS  . polyethylene glycol  17 g Oral Daily  . potassium chloride  40  mEq Oral BID  . raloxifene  60 mg Oral Daily  . spironolactone  12.5 mg Oral Daily  . Tamsulosin HCl  0.4 mg Oral QHS  . warfarin  3 mg Oral ONCE-1800  . Warfarin - Pharmacist Dosing Inpatient   Does not apply q1800  . [DISCONTINUED] azithromycin  500 mg Intravenous Q24H  . [DISCONTINUED] furosemide  40 mg Intravenous Q6H  . [DISCONTINUED] furosemide  40 mg Intravenous Q8H  . 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  INR supra-therapeutic on admission and continues to rise despite holding dose.  No bleeding noted.  Today INR has dropped back into therapeutic range.  Goal of Therapy:  INR 2-3   Plan: Resume Coumadin with 3mg  today x 1 dose INR/PT daily  Nikai Quest A 06/12/2012,10:48 AM

## 2012-06-12 NOTE — Progress Notes (Signed)
Discharge instructions and prescriptions given, verbalized understanding, out via stretcher with oxygen in stable condition with [facility].

## 2012-06-12 NOTE — Discharge Summary (Signed)
Physician Discharge Summary  Brittany Clarke ZOX:[id number] DOB: 1941-04-07 DOA: 06/10/2012  PCP: Redmond Baseman, MD  Admit date: 06/10/2012 Discharge date: 06/12/2012  Time spent: 60 minutes  Recommendations for Outpatient Follow-up:  Please monitor blood sugars. Actos was discontinued as it is contraindicated with heart failure. Please check oxygenation status. Patient recently discharged on oxygen Please check hemoglobin patient noted to be anemic Please check bmet patient on PRN Lasix for volume overload from mixed heart failure. Please check INR on Friday, January 24. Patient's Coumadin dosage was decreased to 2.5 mg daily at the time of discharge.  Discharge Diagnoses:  Active Problems:  PNA (pneumonia)  Hypokalemia  CHF (congestive heart failure)  Morbid obesity  History of CVA (cerebrovascular accident)  Anemia  DDD (degenerative disc disease), lumbar  Allergic rhinitis  Gout  HTN (hypertension)  DM (diabetes mellitus)  Constipation  Urinary incontinence  History of breast cancer  COPD (chronic obstructive pulmonary disease)   Discharge Condition: Stable to rest and recuperate at home. Still desaturating and requiring 2 L of oxygen via nasal cannula  Diet recommendation: Low sodium heart healthy  Filed Weights   06/10/12 2037 06/11/12 0418 06/12/12 0547  Weight: 109.2 kg (240 lb 11.9 oz) 109.2 kg (240 lb 11.9 oz) 110.95 kg (244 lb 9.6 oz)    History of present illness at the time of admission:  Brittany Clarke is a 72 y.o. 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 The patient lives at home with family members who provide her with 24x 7 care- she was recently released from [facility]  skilled nursing facility where she completed a 3 month rehabilitation admission following a stroke that left her with a right hemiparesis. She is largely bed-bound, both by her stroke and by her morbid obesity which makes physical activity extremely difficult. Her daughter tells me that they're able to get her out of bed into a chair with full assistance and also onto a bedside commode and this is the extent of her physical activity. There are minimal medical records available in the [facility]-she has 2 family members at bedside one is her daughter who is an LPN and she tells me that she thinks she has a prior history of congestive heart failure but in general the patient and family have limited knowledge on her current medical problems. In the emergency department her chest x-ray indicates pulmonary edema with an underlying infiltrate suspicious for pneumonia, she has an elevated BNP, her medical history of COPD is questionable and on exam there is minimal wheezing. She complains of seasonal allergies and rhinorrhea. She denies any chest pain. No nausea vomiting diarrhea. No sick contacts and she was vaccinated for influenza. Hemodynamically she is stable on room air. Admission requested for treatment of community-acquired pneumonia and congestive heart failure exacerbation.   Hospital Course:   Mixed diastolic and systolic heart failure  2D echo results listed below.  Serial troponins remained within normal limits. BNP minimally elevated at 1330.  She is diuresed 3.6 L  this admission with Lasix and spironolactone.  Diuretics were decreased significantly today do to mild hypertension.  She will be discharged on Lasix 20 mg daily PRN leg swelling.  She will continue on losartan and metoprolol.  Recommend followup  with cardiology after discharge for CHF.  She will be discharged with home health nurse for CHF management as well as a CAP Aide.  Community acquired pneumonia  The patient was found to have  right lower lobe pneumonia on chest x-ray at the time of admission. I believe this was the primary reason for her shortness of breath. She was treated with IV Rocephin and azithromycin. Strep pneumo urinary antigen was positive. Consequently azithromycin was discontinued, and IV Rocephin was changed to oral Ceftin.  At the time of discharge her white count has normalized at 9.9.  She had a low-grade fever last night of 99.9 but this morning is afebrile.   While it is improved she continues to have a somewhat nonproductive cough. She will continue on Tessalon Perles and guaifenesin. Blood cultures are negative to date but still pending.  Brittany Clarke continued to require oxygen throughout her hospital stay. On the day of discharge she desaturated to 87% on room air at rest. She will be discharged on 2 L of oxygen via nasal cannula continuous.   Mild Hypokalemia  Due to diuresis she developed mild hypokalemia. This was repleted with IV and oral potassium. On the day of discharge her potassium is within normal limits.  Normocytic anemia  Felt to be stable for outpatient followup  allergic rhinitis  Stable and quiet during this admission. Plan to continue home medications.   Chronic anticoagulation  Coumadin was managed per pharmacy. The indication was the patient's previous CVA. Her dose at discharge was reduced to 2.5 mg daily. We request that the home health RN draw an INR on Friday (January 24) and send the results to Dr. Modesto Charon for dosage adjustment if necessary.  Diabetes  CBGs stable in house on sliding scale insulin moderate.    Actos has been discontinued as it is contraindicated with CHF.   Metformin was resumed at discharge.  We will ask her to follow with her primary care physician regarding her diabetes management   Procedures:  2-D echocardiogram Study Conclusions  - Left ventricle: The cavity size was normal. Wall thickness was increased in a pattern of mild LVH. Systolic function was  mildly to moderately reduced. The estimated ejection fraction was in the range of 40% to 45%. Probable hypokinesis of the distalanteroseptal myocardium. There is hypokinesis of the inferior myocardium. Doppler parameters are consistent with abnormal left ventricular relaxation (grade 1 diastolic dysfunction). No previous study for comparison. - Aortic valve: Mildly calcified annulus. Trileaflet. No significant regurgitation. - Aortic root: The aortic root was mildly dilated. - Mitral valve: Calcified annulus. - Left atrium: The atrium was mildly dilated. - Right ventricle: Systolic function was mildly reduced. - Tricuspid valve: Mild regurgitation. - Pericardium, extracardiac: There was no pericardial effusion.  Discharge Exam: Filed Vitals:   06/12/12 0547 06/12/12 0708 06/12/12 0929 06/12/12 1322  BP:   114/45 103/49  Pulse:   115 115  Temp:      TempSrc:      Resp:      Height:      Weight: 110.95 kg (244 lb 9.6 oz)     SpO2:  97%      General: Pleasant, obese, African American female lying comfortably in bed, no apparent stress Cardiovascular: Regular rate and rhythm without obvious murmurs rubs or gallops, Respiratory: No wheezes or crackles, no accessory muscle movement, still with somewhat decreased breath sounds Abdomen: Obese, nontender, nondistended, positive bowel sounds, no obvious masses Lower extremities: Without erythema but still with chronic  significant edema. Patient has right-sided weakness from previous stroke  Discharge Instructions  Discharge Orders    Future Orders Please Complete By Expires   Diet - low sodium heart healthy      Increase activity slowly          Medication List     As of 06/12/2012  3:53 PM    STOP taking these medications         amoxicillin 500 MG capsule   Commonly known as: AMOXIL      LORazepam 0.5 MG tablet   Commonly known as: ATIVAN      pioglitazone 30 MG tablet   Commonly known as: ACTOS      TAKE these  medications         acetaminophen 500 MG tablet   Commonly known as: TYLENOL   Take 500 mg by mouth every 6 (six) hours as needed. For pain      albuterol (5 MG/ML) 0.5% nebulizer solution   Commonly known as: PROVENTIL   Take 0.5 mLs (2.5 mg total) by nebulization every 2 (two) hours as needed for wheezing or shortness of breath.      allopurinol 300 MG tablet   Commonly known as: ZYLOPRIM   Take 300 mg by mouth every morning.      baclofen 10 MG tablet   Commonly known as: LIORESAL   Take 10 mg by mouth 3 (three) times daily.      benzonatate 200 MG capsule   Commonly known as: TESSALON   Take 200 mg by mouth 3 (three) times daily as needed.      cefUROXime 500 MG tablet   Commonly known as: CEFTIN   Take 1 tablet (500 mg total) by mouth 2 (two) times daily with a meal.      colesevelam 625 MG tablet   Commonly known as: WELCHOL   Take 1,250 mg by mouth 2 (two) times daily with a meal.      feeding supplement Liqd   Take 237 mLs by mouth 3 (three) times daily between meals.      Fluticasone-Salmeterol 250-50 MCG/DOSE Aepb   Commonly known as: ADVAIR   Inhale 1 puff into the lungs every 12 (twelve) hours. RINSE AFTERWARDS      furosemide 20 MG tablet   Commonly known as: LASIX   Take 1 tablet (20 mg total) by mouth daily as needed (LEG SWELLING).      gabapentin 400 MG capsule   Commonly known as: NEURONTIN   Take 400 mg by mouth every evening.      guaiFENesin 600 MG 12 hr tablet   Commonly known as: MUCINEX   Take 2 tablets (1,200 mg total) by mouth 2 (two) times daily as needed.      HYDROcodone-acetaminophen 5-325 MG per tablet   Commonly known as: NORCO/VICODIN   Take 1 tablet by mouth at bedtime. For pain      loratadine 10 MG tablet   Commonly known as: CLARITIN   Take 10 mg by mouth daily.      losartan 25 MG tablet   Commonly known as: COZAAR   Take 25 mg by mouth daily.      meclizine 25 MG tablet   Commonly known as: ANTIVERT   Take 25 mg by  mouth 3 (three) times daily as needed.      metFORMIN 500 MG tablet   Commonly known as: GLUCOPHAGE   Take 500 mg by mouth 2 (two) times  daily with a meal.      metoprolol tartrate 25 MG tablet   Commonly known as: LOPRESSOR   Take 12.5 mg by mouth 2 (two) times daily.      montelukast 10 MG tablet   Commonly known as: SINGULAIR   Take 10 mg by mouth at bedtime.      polyethylene glycol packet   Commonly known as: MIRALAX / GLYCOLAX   Take 17 g by mouth daily.      raloxifene 60 MG tablet   Commonly known as: EVISTA   Take 60 mg by mouth daily.      Tamsulosin HCl 0.4 MG Caps   Commonly known as: FLOMAX   Take 0.4 mg by mouth every evening.      warfarin 2.5 MG tablet   Commonly known as: COUMADIN   Take 1 tablet (2.5 mg total) by mouth daily.           Follow-up Information    Follow up with Redmond Baseman, MD. On 06/20/2012. (10:20 am)    Contact information:   [address] [place] [address] [phone]       Follow up with [facility]. (RN, PT, SPT- 06/14/12 RN to draw blood and call results to Dr. Modesto Charon)    Contact information:   [address] [place] [address] [phone]          The results of significant diagnostics from this hospitalization (including imaging, microbiology, ancillary and laboratory) are listed below for reference.    Significant Diagnostic Studies: Dg Chest 2 View  06/10/2012  *RADIOLOGY REPORT*  Clinical Data: Cough.  CHEST - 2 VIEW  Comparison: 01/24/2012  Findings: Low lung volumes are again noted.  There is asymmetric patchy airspace disease in the right upper lobe, suspicious for pneumonia.  Cardiomegaly is stable.  No definite evidence of congestive heart failure.  Airspace disease is also seen in the posterior lower lobe on the lateral projection, and right lower lobe pneumonia is also suspected. No pleural effusion identified.  IMPRESSION:  Right upper lobe  airspace disease, suspicious for pneumonia. Posterior right lower lobe pneumonia also suspected on lateral radiograph. Post-treatment  radiographic followup recommended to confirm resolution.   Original Report Authenticated By: Myles Rosenthal, M.D.     Microbiology: Recent Results (from the past 240 hour(s))  CULTURE, BLOOD (ROUTINE X 2)     Status: Normal (Preliminary result)   Collection Time   06/10/12  5:25 PM      Component Value Range Status Comment   Specimen Description BLOOD LEFT HAND   Final    Special Requests     Final    Value: BOTTLES DRAWN AEROBIC AND ANAEROBIC AEB=5CC ANA=4CC   Culture NO GROWTH 2 DAYS   Final    Report Status PENDING   Incomplete   CULTURE, BLOOD (ROUTINE X 2)     Status: Normal (Preliminary result)   Collection Time   06/10/12  6:10 PM      Component Value Range Status Comment   Specimen Description BLOOD LEFT ARM   Final    Special Requests BOTTLES DRAWN AEROBIC ONLY 4CC   Final    Culture NO GROWTH 2 DAYS   Final    Report Status PENDING   Incomplete   URINE CULTURE     Status: Normal   Collection Time   06/10/12  7:45 PM      Component Value Range Status Comment  Specimen Description URINE, CLEAN CATCH   Final    Special Requests NONE   Final    Culture  Setup Time 06/10/2012 20:00   Final    Colony Count NO GROWTH   Final    Culture NO GROWTH   Final    Report Status 06/11/2012 FINAL   Final   CULTURE, EXPECTORATED SPUTUM-ASSESSMENT     Status: Normal   Collection Time   06/11/12  1:18 AM      Component Value Range Status Comment   Specimen Description SPUTUM   Final    Special Requests NONE   Final    Sputum evaluation     Final    Value: THIS SPECIMEN IS ACCEPTABLE. RESPIRATORY CULTURE REPORT TO FOLLOW.   Report Status 06/11/2012 FINAL   Final   CULTURE, RESPIRATORY     Status: Normal (Preliminary result)   Collection Time   06/11/12  1:18 AM      Component Value Range Status Comment   Specimen Description SPUTUM EXPECTORATED   Final     Special Requests NONE   Final    Gram Stain     Final    Value: ABUNDANT WBC PRESENT, PREDOMINANTLY PMN     MODERATE SQUAMOUS EPITHELIAL CELLS PRESENT     FEW GRAM POSITIVE COCCI IN CLUSTERS     IN PAIRS RARE GRAM NEGATIVE RODS     RARE GRAM POSITIVE RODS   Culture NORMAL OROPHARYNGEAL FLORA   Final    Report Status PENDING   Incomplete      Labs: Basic Metabolic Panel:  Lab 06/12/12 0102 06/11/12 0400 06/10/12 2145 06/10/12 1707  NA 138 138 -- 139  K 3.2* 3.8 -- 3.0*  CL 98 104 -- 102  CO2 30 26 -- 23  GLUCOSE 112* 100* -- 86  BUN 8 6 -- 8  CREATININE 0.56 0.47* -- 0.47*  CALCIUM 9.3 9.2 -- 9.8  MG -- -- 1.5 --  PHOS -- -- -- --   Liver Function Tests:  Lab 06/10/12 1707  AST 18  ALT 10  ALKPHOS 69  BILITOT 0.2*  PROT 7.1  ALBUMIN 2.7*   CBC:  Lab 06/12/12 0440 06/11/12 0400 06/10/12 1707  WBC 9.9 12.6* 11.0*  NEUTROABS -- -- 8.2*  HGB 10.3* 10.2* 11.2*  HCT 31.1* 30.9* 34.2*  MCV 92.3 93.1 93.4  PLT 356 315 319   Cardiac Enzymes:  Lab 06/11/12 1118 06/11/12 0500 06/10/12 2144 06/10/12 1800  CKTOTAL -- -- -- --  CKMB -- -- -- --  CKMBINDEX -- -- -- --  TROPONINI <0.30 <0.30 <0.30 <0.30   BNP: BNP (last 3 results)  Basename 06/10/12 1707  PROBNP 1330.0*   CBG:  Lab 06/12/12 1127 06/12/12 0718 06/11/12 2056 06/11/12 1619 06/11/12 1139  GLUCAP 135* 118* 118* 104* 110*    Signed:  Conley Canal [phone]  [facility] 06/12/2012, 3:53 PM  Crista Curb, M.D.

## 2012-06-12 NOTE — Progress Notes (Signed)
Oxygen saturation at rest 87% room air, O2 at 2L/M applied via [place], sats increased to 97%.

## 2012-06-12 NOTE — Progress Notes (Signed)
Patient O2 saturation dropped to 86%; placed on 2L of O2. O2 saturation increased to 95%. No SOB or difficulty breathing present. Patient resting calmly.

## 2012-06-13 LAB — CULTURE, RESPIRATORY W GRAM STAIN: Culture: NORMAL

## 2012-06-17 LAB — CULTURE, BLOOD (ROUTINE X 2)

## 2012-07-17 ENCOUNTER — Encounter (HOSPITAL_COMMUNITY): Payer: Self-pay | Admitting: Emergency Medicine

## 2012-07-17 ENCOUNTER — Emergency Department (HOSPITAL_COMMUNITY)
Admission: EM | Admit: 2012-07-17 | Discharge: 2012-07-18 | Disposition: A | Discharge status: Home / Self Care | Payer: Medicare Other | Attending: Emergency Medicine | Admitting: Emergency Medicine

## 2012-07-17 DIAGNOSIS — F411 Generalized anxiety disorder: Secondary | ICD-10-CM | POA: Insufficient documentation

## 2012-07-17 DIAGNOSIS — Z8701 Personal history of pneumonia (recurrent): Secondary | ICD-10-CM | POA: Insufficient documentation

## 2012-07-17 DIAGNOSIS — E119 Type 2 diabetes mellitus without complications: Secondary | ICD-10-CM | POA: Insufficient documentation

## 2012-07-17 DIAGNOSIS — I509 Heart failure, unspecified: Secondary | ICD-10-CM | POA: Insufficient documentation

## 2012-07-17 DIAGNOSIS — Z8679 Personal history of other diseases of the circulatory system: Secondary | ICD-10-CM | POA: Insufficient documentation

## 2012-07-17 DIAGNOSIS — IMO0002 Reserved for concepts with insufficient information to code with codable children: Secondary | ICD-10-CM | POA: Insufficient documentation

## 2012-07-17 DIAGNOSIS — Z8673 Personal history of transient ischemic attack (TIA), and cerebral infarction without residual deficits: Secondary | ICD-10-CM | POA: Insufficient documentation

## 2012-07-17 DIAGNOSIS — Z7901 Long term (current) use of anticoagulants: Secondary | ICD-10-CM | POA: Insufficient documentation

## 2012-07-17 DIAGNOSIS — M25569 Pain in unspecified knee: Secondary | ICD-10-CM | POA: Insufficient documentation

## 2012-07-17 DIAGNOSIS — Z853 Personal history of malignant neoplasm of breast: Secondary | ICD-10-CM | POA: Insufficient documentation

## 2012-07-17 DIAGNOSIS — M1710 Unilateral primary osteoarthritis, unspecified knee: Secondary | ICD-10-CM

## 2012-07-17 DIAGNOSIS — J4489 Other specified chronic obstructive pulmonary disease: Secondary | ICD-10-CM | POA: Insufficient documentation

## 2012-07-17 DIAGNOSIS — Z79899 Other long term (current) drug therapy: Secondary | ICD-10-CM | POA: Insufficient documentation

## 2012-07-17 DIAGNOSIS — I10 Essential (primary) hypertension: Secondary | ICD-10-CM | POA: Insufficient documentation

## 2012-07-17 NOTE — ED Notes (Signed)
Pt c/o increased rt leg pain. Pt states the pain is intermittently.

## 2012-07-18 ENCOUNTER — Emergency Department (HOSPITAL_COMMUNITY): Payer: Medicare Other

## 2012-07-18 LAB — CBC
MCH: 31.2 pg (ref 26.0–34.0)
MCHC: 33.1 g/dL (ref 30.0–36.0)
MCV: 94.1 fL (ref 78.0–100.0)
Platelets: 262 10*3/uL (ref 150–400)
RBC: 3.56 MIL/uL — ABNORMAL LOW (ref 3.87–5.11)
RDW: 15.5 % (ref 11.5–15.5)

## 2012-07-18 LAB — BASIC METABOLIC PANEL
CO2: 23 mEq/L (ref 19–32)
Calcium: 9.8 mg/dL (ref 8.4–10.5)
Creatinine, Ser: 0.43 mg/dL — ABNORMAL LOW (ref 0.50–1.10)
GFR calc non Af Amer: >90 mL/min (ref >90)

## 2012-07-18 LAB — PROTIME-INR: Prothrombin Time: 16.2 seconds — ABNORMAL HIGH (ref 11.6–15.2)

## 2012-07-18 MED ORDER — OXYCODONE-ACETAMINOPHEN 5-325 MG PO TABS: 1.0000 | ORAL_TABLET | ORAL | Status: DC | PRN

## 2012-07-18 MED ORDER — HYDROMORPHONE HCL PF 2 MG/ML IJ SOLN
2.0000 mg | Freq: Once | INTRAMUSCULAR | Status: AC
  Administered 2012-07-18: 2 mg via INTRAMUSCULAR
  Filled 2012-07-18: qty 1

## 2012-07-18 NOTE — ED Provider Notes (Signed)
History     CSN: [id number]  Arrival date & time 07/17/12  2349   First MD Initiated Contact with Patient 07/18/12 0025      Chief Complaint  Patient presents with  . Leg Pain    (Consider location/radiation/quality/duration/timing/severity/associated sxs/prior treatment) HPI Comments: On coumadin for hx of afib. No hx of DVT. No unilateral leg swelling. Denies new weakness. immobolized right LE secondary to prior stroke. Can still wiggle toes on right which is baseline for her  Patient is a 72 y.o. female presenting with leg pain. The history is provided by the patient.  Leg Pain Location:  Knee Time since incident:  2 days Injury: no   Knee location:  R knee Pain details:    Quality:  Aching   Radiates to:  Does not radiate   Severity:  Severe   Onset quality:  Gradual   Timing:  Intermittent   Progression:  Unchanged Chronicity:  Recurrent Dislocation: no   Prior injury to area:  No Relieved by:  Nothing Worsened by:  Flexion, extension and activity Ineffective treatments: home medications (vicodin) Associated symptoms: no decreased ROM, no fever, no itching, no stiffness and no swelling     Past Medical History  Diagnosis Date  . CHF (congestive heart failure)   . Diabetes mellitus without complication   . COPD (chronic obstructive pulmonary disease)   . Hypertension   . Asthma   . Shortness of breath   . Stroke   . Anxiety   . 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 Pneumonia     Past Surgical History  Procedure Laterality Date  . Breast surgery    . Tubal ligation      History reviewed. No pertinent family history.  History  Substance Use Topics  . Smoking status: Never Smoker   . Smokeless tobacco: Not on file  . Alcohol Use: No    OB History   Grav Para Term Preterm Abortions TAB SAB Ect Mult Living                  Review of Systems  Constitutional: Negative for fever.  Musculoskeletal: Negative for stiffness.   Skin: Negative for itching.  All other systems reviewed and are negative.    Allergies  Ultram  Home Medications   Current Outpatient Rx  Name  Route  Sig  Dispense  Refill  . acetaminophen (TYLENOL) 500 MG tablet   Oral   Take 500 mg by mouth every 6 (six) hours as needed. For pain         . albuterol (PROVENTIL) (5 MG/ML) 0.5% nebulizer solution   Nebulization   Take 0.5 mLs (2.5 mg total) by nebulization every 2 (two) hours as needed for wheezing or shortness of breath.   20 mL   1   . allopurinol (ZYLOPRIM) 300 MG tablet   Oral   Take 300 mg by mouth every morning.         . baclofen (LIORESAL) 10 MG tablet   Oral   Take 10 mg by mouth 3 (three) times daily.         . benzonatate (TESSALON) 200 MG capsule   Oral   Take 200 mg by mouth 3 (three) times daily as needed.         . cefUROXime (CEFTIN) 500 MG tablet   Oral   Take 1 tablet (500 mg total) by mouth 2 (two) times daily with  a meal.   10 tablet   0   . colesevelam (WELCHOL) 625 MG tablet   Oral   Take 1,250 mg by mouth 2 (two) times daily with a meal.         . feeding supplement (GLUCERNA SHAKE) LIQD   Oral   Take 237 mLs by mouth 3 (three) times daily between meals.   60 Can   1   . Fluticasone-Salmeterol (ADVAIR) 250-50 MCG/DOSE AEPB   Inhalation   Inhale 1 puff into the lungs every 12 (twelve) hours. RINSE AFTERWARDS         . furosemide (LASIX) 20 MG tablet   Oral   Take 1 tablet (20 mg total) by mouth daily as needed (LEG SWELLING).   30 tablet   0   . gabapentin (NEURONTIN) 400 MG capsule   Oral   Take 400 mg by mouth every evening.         Marland Kitchen guaiFENesin (MUCINEX) 600 MG 12 hr tablet   Oral   Take 2 tablets (1,200 mg total) by mouth 2 (two) times daily as needed.         Marland Kitchen HYDROcodone-acetaminophen (NORCO/VICODIN) 5-325 MG per tablet   Oral   Take 1 tablet by mouth at bedtime. For pain         . loratadine (CLARITIN) 10 MG tablet   Oral   Take 10 mg by  mouth daily.         Marland Kitchen losartan (COZAAR) 25 MG tablet   Oral   Take 25 mg by mouth daily.         . meclizine (ANTIVERT) 25 MG tablet   Oral   Take 25 mg by mouth 3 (three) times daily as needed.         . metFORMIN (GLUCOPHAGE) 500 MG tablet   Oral   Take 500 mg by mouth 2 (two) times daily with a meal.         . metoprolol tartrate (LOPRESSOR) 25 MG tablet   Oral   Take 12.5 mg by mouth 2 (two) times daily.         . montelukast (SINGULAIR) 10 MG tablet   Oral   Take 10 mg by mouth at bedtime.         Marland Kitchen oxyCODONE-acetaminophen (PERCOCET/ROXICET) 5-325 MG per tablet   Oral   Take 1 tablet by mouth every 4 (four) hours as needed for pain.   20 tablet   0   . polyethylene glycol (MIRALAX / GLYCOLAX) packet   Oral   Take 17 g by mouth daily.         . raloxifene (EVISTA) 60 MG tablet   Oral   Take 60 mg by mouth daily.         . Tamsulosin HCl (FLOMAX) 0.4 MG CAPS   Oral   Take 0.4 mg by mouth every evening.         . warfarin (COUMADIN) 2.5 MG tablet   Oral   Take 1 tablet (2.5 mg total) by mouth daily.   30 tablet   0     BP 136/62  Pulse 95  Temp(Src) 98.4 F (36.9 C) (Oral)  Ht 5\' 5"  (1.651 m)  Wt 255 lb (115.667 kg)  BMI 42.43 kg/m2  SpO2 100%  Physical Exam  Nursing note and vitals reviewed. Constitutional: She is oriented to person, place, and time. She appears well-developed and well-nourished. No distress.  HENT:  Head: Normocephalic and atraumatic.  Eyes: EOM are normal.  Neck: Normal range of motion.  Cardiovascular: Normal rate, regular rhythm and normal heart sounds.   Pulmonary/Chest: Effort normal and breath sounds normal.  Abdominal: Soft. She exhibits no distension. There is no tenderness.  Musculoskeletal: Normal range of motion.  Normal pulses in right foot. Pain with ROM of right knee. No obvious joint effusion. No erythema or warmth. Compartments of RLE soft. No unilateral leg swelling or calf tenderness. No  discoloration of right leg  Neurological: She is alert and oriented to person, place, and time.  Skin: Skin is warm and dry.  Psychiatric: She has a normal mood and affect. Judgment normal.    ED Course  Procedures (including critical care time)  Labs Reviewed  CBC - Abnormal; Notable for the following:    RBC 3.56 (*)    Hemoglobin 11.1 (*)    HCT 33.5 (*)    All other components within normal limits  BASIC METABOLIC PANEL - Abnormal; Notable for the following:    Glucose, Bld 124 (*)    Creatinine, Ser 0.43 (*)    All other components within normal limits  PROTIME-INR - Abnormal; Notable for the following:    Prothrombin Time 16.2 (*)    All other components within normal limits   Dg Knee Complete 4 Views Right  07/18/2012  *RADIOLOGY REPORT*  Clinical Data: Right knee pain.  Unable to move the right leg due to of stroke.  RIGHT KNEE - COMPLETE 4+ VIEW  Comparison: None.  Findings: Technically limited study due to underpenetration.  There are degenerative changes in the right knee with medial and lateral compartment narrowing and hypertrophic changes present.  No evidence of acute fracture or subluxation.  No focal bone lesion or bone destruction.  The bone cortex and trabecular architecture appear intact.  No significant effusion.  IMPRESSION: Degenerative changes in the right knee.  No acute bony abnormalities suggested.   Original Report Authenticated By: Burman Nieves, M.D.      1. OA (osteoarthritis) of knee   2. Right knee pain       MDM  Suspect severe arthritis. No joint effusion noted on exam or imaging. May represent gout vs OA. Will switch from vicodin to percocet for pain. Doubt septic arthritis. Normal pulses. No signs of ischemia. Doubt DVT. On coumadin, subtherapeutic INR today. Pt already aware of this and working with physician to manage her INR. Recent increase in her coumadin        Lyanne Co, MD 07/18/12 0201

## 2012-08-08 ENCOUNTER — Ambulatory Visit: Payer: Self-pay | Admitting: Family Medicine

## 2012-08-08 ENCOUNTER — Encounter: Payer: Self-pay | Admitting: Family Medicine

## 2012-08-08 ENCOUNTER — Ambulatory Visit (INDEPENDENT_AMBULATORY_CARE_PROVIDER_SITE_OTHER): Payer: Medicare Other | Admitting: Family Medicine

## 2012-08-08 VITALS — BP 126/83 | HR 102 | Temp 98.9°F

## 2012-08-08 DIAGNOSIS — Z7901 Long term (current) use of anticoagulants: Secondary | ICD-10-CM

## 2012-08-08 DIAGNOSIS — Z5181 Encounter for therapeutic drug level monitoring: Secondary | ICD-10-CM

## 2012-08-08 DIAGNOSIS — I6350 Cerebral infarction due to unspecified occlusion or stenosis of unspecified cerebral artery: Secondary | ICD-10-CM

## 2012-08-08 LAB — POCT INR: INR: 5

## 2012-08-08 NOTE — Progress Notes (Signed)
Subjective:     Patient ID: Brittany Clarke, female   DOB: 1940-11-10, 72 y.o.   MRN: [id number]  HPI Patient is here for protime check.. Results on the chart and action to be taken.  She's also here for followup for reevaluation of her CVA and physical therapy which she obtains at [facility] She is wheelchair-bound. She has a right hemiplegia post CVA. Past Medical History  Diagnosis Date  . CHF (congestive heart failure)   . Diabetes mellitus without complication   . COPD (chronic obstructive pulmonary disease)   . Hypertension   . Asthma   . Shortness of breath   . Stroke   . Anxiety   . 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 Pneumonia    Past Surgical History  Procedure Laterality Date  . Breast surgery    . Tubal ligation     History   Social History  . Marital Status: Legally Separated    Spouse Name: N/A    Number of Children: N/A  . Years of Education: N/A   Occupational History  . Not on file.   Social History Main Topics  . Smoking status: Never Smoker   . Smokeless tobacco: Not on file  . Alcohol Use: No  . Drug Use: No  . Sexually Active:    Other Topics Concern  . Not on file   Social History Narrative  . No narrative on file   No family history on file. Current Outpatient Prescriptions on File Prior to Visit  Medication Sig Dispense Refill  . acetaminophen (TYLENOL) 500 MG tablet Take 500 mg by mouth every 6 (six) hours as needed. For pain      . allopurinol (ZYLOPRIM) 300 MG tablet Take 300 mg by mouth every morning.      . baclofen (LIORESAL) 10 MG tablet Take 10 mg by mouth 3 (three) times daily.      . feeding supplement (GLUCERNA SHAKE) LIQD Take 237 mLs by mouth 3 (three) times daily between meals.  60 Can  1  . Fluticasone-Salmeterol (ADVAIR) 250-50 MCG/DOSE AEPB Inhale 1 puff into the lungs every 12 (twelve) hours. RINSE AFTERWARDS      . furosemide (LASIX) 20 MG tablet Take 1 tablet (20 mg total) by mouth  daily as needed (LEG SWELLING).  30 tablet  0  . gabapentin (NEURONTIN) 400 MG capsule Take 400 mg by mouth every evening.      Marland Kitchen HYDROcodone-acetaminophen (NORCO/VICODIN) 5-325 MG per tablet Take 1 tablet by mouth at bedtime. For pain      . loratadine (CLARITIN) 10 MG tablet Take 10 mg by mouth daily.      Marland Kitchen losartan (COZAAR) 25 MG tablet Take 25 mg by mouth daily.      . meclizine (ANTIVERT) 25 MG tablet Take 25 mg by mouth 3 (three) times daily as needed.      . metFORMIN (GLUCOPHAGE) 500 MG tablet Take 500 mg by mouth 2 (two) times daily with a meal.      . metoprolol tartrate (LOPRESSOR) 25 MG tablet Take 12.5 mg by mouth 2 (two) times daily.      . montelukast (SINGULAIR) 10 MG tablet Take 10 mg by mouth at bedtime.      . polyethylene glycol (MIRALAX / GLYCOLAX) packet Take 17 g by mouth daily.      . raloxifene (EVISTA) 60 MG tablet Take 60 mg by mouth daily.      Marland Kitchen  Tamsulosin HCl (FLOMAX) 0.4 MG CAPS Take 0.4 mg by mouth every evening.      . warfarin (COUMADIN) 2.5 MG tablet Take 1 tablet (2.5 mg total) by mouth daily.  30 tablet  0  . albuterol (PROVENTIL) (5 MG/ML) 0.5% nebulizer solution Take 0.5 mLs (2.5 mg total) by nebulization every 2 (two) hours as needed for wheezing or shortness of breath.  20 mL  1  . benzonatate (TESSALON) 200 MG capsule Take 200 mg by mouth 3 (three) times daily as needed.      . cefUROXime (CEFTIN) 500 MG tablet Take 1 tablet (500 mg total) by mouth 2 (two) times daily with a meal.  10 tablet  0  . colesevelam (WELCHOL) 625 MG tablet Take 1,250 mg by mouth 2 (two) times daily with a meal.      . guaiFENesin (MUCINEX) 600 MG 12 hr tablet Take 2 tablets (1,200 mg total) by mouth 2 (two) times daily as needed.      Marland Kitchen oxyCODONE-acetaminophen (PERCOCET/ROXICET) 5-325 MG per tablet Take 1 tablet by mouth every 4 (four) hours as needed for pain.  20 tablet  0   No current facility-administered medications on file prior to visit.   Allergies  Allergen  Reactions  . Ultram (Tramadol) Shortness Of Breath    There is no immunization history on file for this patient. Prior to Admission medications   Medication Sig Start Date End Date Taking? Authorizing Provider  acetaminophen (TYLENOL) 500 MG tablet Take 500 mg by mouth every 6 (six) hours as needed. For pain   Yes Historical Provider, MD  allopurinol (ZYLOPRIM) 300 MG tablet Take 300 mg by mouth every morning.   Yes Historical Provider, MD  baclofen (LIORESAL) 10 MG tablet Take 10 mg by mouth 3 (three) times daily.   Yes Historical Provider, MD  feeding supplement (GLUCERNA SHAKE) LIQD Take 237 mLs by mouth 3 (three) times daily between meals. 06/12/12  Yes Marianne L York, PA-C  Fluticasone-Salmeterol (ADVAIR) 250-50 MCG/DOSE AEPB Inhale 1 puff into the lungs every 12 (twelve) hours. RINSE AFTERWARDS   Yes Historical Provider, MD  furosemide (LASIX) 20 MG tablet Take 1 tablet (20 mg total) by mouth daily as needed (LEG SWELLING). 06/12/12  Yes Marianne L York, PA-C  gabapentin (NEURONTIN) 400 MG capsule Take 400 mg by mouth every evening.   Yes Historical Provider, MD  HYDROcodone-acetaminophen (NORCO/VICODIN) 5-325 MG per tablet Take 1 tablet by mouth at bedtime. For pain   Yes Historical Provider, MD  loratadine (CLARITIN) 10 MG tablet Take 10 mg by mouth daily.   Yes Historical Provider, MD  losartan (COZAAR) 25 MG tablet Take 25 mg by mouth daily.   Yes Historical Provider, MD  meclizine (ANTIVERT) 25 MG tablet Take 25 mg by mouth 3 (three) times daily as needed.   Yes Historical Provider, MD  metFORMIN (GLUCOPHAGE) 500 MG tablet Take 500 mg by mouth 2 (two) times daily with a meal.   Yes Historical Provider, MD  metoprolol tartrate (LOPRESSOR) 25 MG tablet Take 12.5 mg by mouth 2 (two) times daily.   Yes Historical Provider, MD  montelukast (SINGULAIR) 10 MG tablet Take 10 mg by mouth at bedtime.   Yes Historical Provider, MD  polyethylene glycol (MIRALAX / GLYCOLAX) packet Take 17 g by  mouth daily.   Yes Historical Provider, MD  raloxifene (EVISTA) 60 MG tablet Take 60 mg by mouth daily.   Yes Historical Provider, MD  Tamsulosin HCl (FLOMAX) 0.4 MG CAPS Take  0.4 mg by mouth every evening.   Yes Historical Provider, MD  warfarin (COUMADIN) 2.5 MG tablet Take 1 tablet (2.5 mg total) by mouth daily. 06/12/12  Yes Marianne L York, PA-C  albuterol (PROVENTIL) (5 MG/ML) 0.5% nebulizer solution Take 0.5 mLs (2.5 mg total) by nebulization every 2 (two) hours as needed for wheezing or shortness of breath. 06/12/12   Tora Kindred York, PA-C  benzonatate (TESSALON) 200 MG capsule Take 200 mg by mouth 3 (three) times daily as needed.    Historical Provider, MD  cefUROXime (CEFTIN) 500 MG tablet Take 1 tablet (500 mg total) by mouth 2 (two) times daily with a meal. 06/12/12   Tora Kindred York, PA-C  colesevelam (WELCHOL) 625 MG tablet Take 1,250 mg by mouth 2 (two) times daily with a meal.    Historical Provider, MD  guaiFENesin (MUCINEX) 600 MG 12 hr tablet Take 2 tablets (1,200 mg total) by mouth 2 (two) times daily as needed. 06/12/12   Stephani Police, PA-C  oxyCODONE-acetaminophen (PERCOCET/ROXICET) 5-325 MG per tablet Take 1 tablet by mouth every 4 (four) hours as needed for pain. 07/18/12   Lyanne Co, MD    Review of Systems  Neurological:       No change in neurologic findings of the right hemiplegia  Hematological:       Patient on Coumadin for anticoagulation   There has been no bleeding in the stool or urine or any of her orifices    Objective:   Physical Exam  On examination she appeared in good health and spirits. Obese. Wheelchair-bound Vital signs as documented. BP 126/83  Pulse 102  Temp(Src) 98.9 F (37.2 C) (Oral)  Skin warm and dry and without overt rashes. Head & Neck without JVD. Lungs clear.  Heart exam notable for regular rhythm, normal sounds and absence of murmurs, rubs or gallops. Abdomen unremarkable and without evidence of organomegaly, masses, or  abdominal aortic enlargement.  Breast exam: not performed. Gyn Exam: Not performed. External Genitalia: Vagina:: Cervix: Uterus: Adnexae: R/V: Extremities/neurologic, no change in the neurologic examination. She has ongoing right hemiplegic findings with weakness in the right upper and lower extremity. Assessment:     CVA (cerebral vascular accident) - Plan: INR  Anticoagulation management encounter      Plan:     [facility] reading (PRIMARY) by  Dr. Leodis Sias. INR is 5.0 today this is supratherapeutic. Patient and daughter's advice to hold the Coumadin today return to the clinic tomorrow to repeat her pro time                             also her form for physical therapy occupational therapy at [facility] was completed to continue physical therapy. She was assessed for that today.

## 2012-08-09 ENCOUNTER — Encounter: Payer: Self-pay | Admitting: Family Medicine

## 2012-08-09 ENCOUNTER — Ambulatory Visit (INDEPENDENT_AMBULATORY_CARE_PROVIDER_SITE_OTHER): Payer: Medicare Other | Admitting: Family Medicine

## 2012-08-09 VITALS — BP 124/80 | HR 100 | Temp 98.7°F

## 2012-08-09 DIAGNOSIS — Z7901 Long term (current) use of anticoagulants: Secondary | ICD-10-CM

## 2012-08-09 LAB — POCT INR: INR: 3.1

## 2012-08-09 NOTE — Progress Notes (Signed)
Subjective:     Patient ID: Brittany Clarke, female   DOB: [birth year], 72 y.o.   MRN: [id number]  HPI   Review of Systems     Objective:   Physical Exam     Assessment:         Plan:      [facility] reading (PRIMARY) by  Dr. Leodis Sias. INR 3.1                                     Subjective:     Patient ID: Brittany Clarke, female   DOB: [birth year], 72 y.o.   MRN: [id number]  HPI Patient is here for protime recheck .Marland Kitchen Results on the chart and action to be taken.  She's also here for followup for reevaluation of her CVA and physical therapy which she obtains at [facility]. She is wheelchair-bound. She has a right hemiplegia post CVA. Past Medical History  Diagnosis Date  . CHF (congestive heart failure)   . Diabetes mellitus without complication   . COPD (chronic obstructive pulmonary disease)   . Hypertension   . Asthma   . Shortness of breath   . Stroke   . Anxiety   . 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 Pneumonia    Past Surgical History  Procedure Laterality Date  . Breast surgery    . Tubal ligation     History   Social History  . Marital Status: Legally Separated    Spouse Name: N/A    Number of Children: N/A  . Years of Education: N/A   Occupational History  . Not on file.   Social History Main Topics  . Smoking status: Never Smoker   . Smokeless tobacco: Not on file  . Alcohol Use: No  . Drug Use: No  . Sexually Active:    Other Topics Concern  . Not on file   Social History Narrative  . No narrative on file   No family history on file. Current Outpatient Prescriptions on File Prior to Visit  Medication Sig Dispense Refill  . acetaminophen (TYLENOL) 500 MG tablet Take 500 mg by mouth every 6 (six) hours as needed. For pain      . allopurinol (ZYLOPRIM) 300 MG tablet Take 300 mg by mouth every morning.      . baclofen (LIORESAL) 10 MG tablet Take 10 mg by mouth 3 (three) times daily.      . feeding  supplement (GLUCERNA SHAKE) LIQD Take 237 mLs by mouth 3 (three) times daily between meals.  60 Can  1  . Fluticasone-Salmeterol (ADVAIR) 250-50 MCG/DOSE AEPB Inhale 1 puff into the lungs every 12 (twelve) hours. RINSE AFTERWARDS      . furosemide (LASIX) 20 MG tablet Take 1 tablet (20 mg total) by mouth daily as needed (LEG SWELLING).  30 tablet  0  . gabapentin (NEURONTIN) 400 MG capsule Take 400 mg by mouth every evening.      Marland Kitchen HYDROcodone-acetaminophen (NORCO/VICODIN) 5-325 MG per tablet Take 1 tablet by mouth at bedtime. For pain      . loratadine (CLARITIN) 10 MG tablet Take 10 mg by mouth daily.      Marland Kitchen losartan (COZAAR) 25 MG tablet Take 25 mg by mouth daily.      . meclizine (ANTIVERT) 25 MG tablet Take 25 mg by mouth 3 (three) times  daily as needed.      . metFORMIN (GLUCOPHAGE) 500 MG tablet Take 500 mg by mouth 2 (two) times daily with a meal.      . metoprolol tartrate (LOPRESSOR) 25 MG tablet Take 12.5 mg by mouth 2 (two) times daily.      . montelukast (SINGULAIR) 10 MG tablet Take 10 mg by mouth at bedtime.      . polyethylene glycol (MIRALAX / GLYCOLAX) packet Take 17 g by mouth daily.      . raloxifene (EVISTA) 60 MG tablet Take 60 mg by mouth daily.      . Tamsulosin HCl (FLOMAX) 0.4 MG CAPS Take 0.4 mg by mouth every evening.      . warfarin (COUMADIN) 2.5 MG tablet Take 1 tablet (2.5 mg total) by mouth daily.  30 tablet  0  . albuterol (PROVENTIL) (5 MG/ML) 0.5% nebulizer solution Take 0.5 mLs (2.5 mg total) by nebulization every 2 (two) hours as needed for wheezing or shortness of breath.  20 mL  1  . benzonatate (TESSALON) 200 MG capsule Take 200 mg by mouth 3 (three) times daily as needed.      . cefUROXime (CEFTIN) 500 MG tablet Take 1 tablet (500 mg total) by mouth 2 (two) times daily with a meal.  10 tablet  0  . colesevelam (WELCHOL) 625 MG tablet Take 1,250 mg by mouth 2 (two) times daily with a meal.      . guaiFENesin (MUCINEX) 600 MG 12 hr tablet Take 2 tablets  (1,200 mg total) by mouth 2 (two) times daily as needed.      Marland Kitchen oxyCODONE-acetaminophen (PERCOCET/ROXICET) 5-325 MG per tablet Take 1 tablet by mouth every 4 (four) hours as needed for pain.  20 tablet  0   No current facility-administered medications on file prior to visit.   Allergies  Allergen Reactions  . Ultram (Tramadol) Shortness Of Breath    There is no immunization history on file for this patient. Prior to Admission medications   Medication Sig Start Date End Date Taking? Authorizing Provider  acetaminophen (TYLENOL) 500 MG tablet Take 500 mg by mouth every 6 (six) hours as needed. For pain   Yes Historical Provider, MD  allopurinol (ZYLOPRIM) 300 MG tablet Take 300 mg by mouth every morning.   Yes Historical Provider, MD  baclofen (LIORESAL) 10 MG tablet Take 10 mg by mouth 3 (three) times daily.   Yes Historical Provider, MD  feeding supplement (GLUCERNA SHAKE) LIQD Take 237 mLs by mouth 3 (three) times daily between meals. 06/12/12  Yes Marianne L York, PA-C  Fluticasone-Salmeterol (ADVAIR) 250-50 MCG/DOSE AEPB Inhale 1 puff into the lungs every 12 (twelve) hours. RINSE AFTERWARDS   Yes Historical Provider, MD  furosemide (LASIX) 20 MG tablet Take 1 tablet (20 mg total) by mouth daily as needed (LEG SWELLING). 06/12/12  Yes Marianne L York, PA-C  gabapentin (NEURONTIN) 400 MG capsule Take 400 mg by mouth every evening.   Yes Historical Provider, MD  HYDROcodone-acetaminophen (NORCO/VICODIN) 5-325 MG per tablet Take 1 tablet by mouth at bedtime. For pain   Yes Historical Provider, MD  loratadine (CLARITIN) 10 MG tablet Take 10 mg by mouth daily.   Yes Historical Provider, MD  losartan (COZAAR) 25 MG tablet Take 25 mg by mouth daily.   Yes Historical Provider, MD  meclizine (ANTIVERT) 25 MG tablet Take 25 mg by mouth 3 (three) times daily as needed.   Yes Historical Provider, MD  metFORMIN (GLUCOPHAGE) 500 MG  tablet Take 500 mg by mouth 2 (two) times daily with a meal.   Yes  Historical Provider, MD  metoprolol tartrate (LOPRESSOR) 25 MG tablet Take 12.5 mg by mouth 2 (two) times daily.   Yes Historical Provider, MD  montelukast (SINGULAIR) 10 MG tablet Take 10 mg by mouth at bedtime.   Yes Historical Provider, MD  polyethylene glycol (MIRALAX / GLYCOLAX) packet Take 17 g by mouth daily.   Yes Historical Provider, MD  raloxifene (EVISTA) 60 MG tablet Take 60 mg by mouth daily.   Yes Historical Provider, MD  Tamsulosin HCl (FLOMAX) 0.4 MG CAPS Take 0.4 mg by mouth every evening.   Yes Historical Provider, MD  warfarin (COUMADIN) 2.5 MG tablet Take 1 tablet (2.5 mg total) by mouth daily. 06/12/12  Yes Marianne L York, PA-C  albuterol (PROVENTIL) (5 MG/ML) 0.5% nebulizer solution Take 0.5 mLs (2.5 mg total) by nebulization every 2 (two) hours as needed for wheezing or shortness of breath. 06/12/12   Tora Kindred York, PA-C  benzonatate (TESSALON) 200 MG capsule Take 200 mg by mouth 3 (three) times daily as needed.    Historical Provider, MD  cefUROXime (CEFTIN) 500 MG tablet Take 1 tablet (500 mg total) by mouth 2 (two) times daily with a meal. 06/12/12   Tora Kindred York, PA-C  colesevelam (WELCHOL) 625 MG tablet Take 1,250 mg by mouth 2 (two) times daily with a meal.    Historical Provider, MD  guaiFENesin (MUCINEX) 600 MG 12 hr tablet Take 2 tablets (1,200 mg total) by mouth 2 (two) times daily as needed. 06/12/12   Stephani Police, PA-C  oxyCODONE-acetaminophen (PERCOCET/ROXICET) 5-325 MG per tablet Take 1 tablet by mouth every 4 (four) hours as needed for pain. 07/18/12   Lyanne Co, MD    Review of Systems  Neurological:       No change in neurologic findings of the right hemiplegia  Hematological:       Patient on Coumadin for anticoagulation   There has been no bleeding in the stool or urine or any of her orifices    Objective:   Physical Exam  On examination she appeared in good health and spirits. Obese. Wheelchair-bound Vital signs as documented. BP 124/80   Pulse 100  Temp(Src) 98.7 F (37.1 C) (Oral)   Skin warm and dry and without overt rashes. Head & Neck without JVD. Lungs clear.  Heart exam notable for regular rhythm, normal sounds and absence of murmurs, rubs or gallops. Abdomen unremarkable and without evidence of organomegaly, masses, or abdominal aortic enlargement.  Breast exam: not performed. Gyn Exam: Not performed. External Genitalia: Vagina:: Cervix: Uterus: Adnexae: R/V: Extremities/neurologic, no change in the neurologic examination. She has ongoing right hemiplegic findings with weakness in the right upper and lower extremity. Assessment:     CVA (cerebral vascular accident) - Plan: INR  Anticoagulation management encounter      Plan:     [facility] reading (PRIMARY) by  Dr. Leodis Sias. INR is 3.1 today this is just above the upper limit of 3.0 goal but is still considered supratherapeutic.     also her form for physical therapy occupational therapy from [facility] was reviewed and signed to continue physical therapy.  The daughter was given a written prescription with a new regimen for her Coumadin: 5 mg everyday except on Mondays when she should take 7.5 mg. She is to return in 6 days when they have transportation available for repeat pro time. With  this adjustment we hopefully we'll have her in the therapeutic range.  Brittany Clarke P. Modesto Charon, M.D.

## 2012-08-15 ENCOUNTER — Ambulatory Visit (INDEPENDENT_AMBULATORY_CARE_PROVIDER_SITE_OTHER): Payer: Medicare Other | Admitting: Family Medicine

## 2012-08-15 VITALS — BP 125/79 | HR 90 | Temp 98.2°F

## 2012-08-15 DIAGNOSIS — I6350 Cerebral infarction due to unspecified occlusion or stenosis of unspecified cerebral artery: Secondary | ICD-10-CM

## 2012-08-15 DIAGNOSIS — I639 Cerebral infarction, unspecified: Secondary | ICD-10-CM

## 2012-08-15 DIAGNOSIS — Z8673 Personal history of transient ischemic attack (TIA), and cerebral infarction without residual deficits: Secondary | ICD-10-CM

## 2012-08-15 LAB — POCT INR: INR: 2.9

## 2012-08-15 NOTE — Progress Notes (Signed)
Patient ID: Brittany Clarke, female   DOB: [birth year], 72 y.o.   MRN: [id number] Subjective Patient is stable. Came in to recheck her pro time after recent adjustment no bleeding. No problems with the adjustment. Did not miss any doses. Present Coumadin dose is 7.5 mg on Mondays and 5 mg all the other days. Past Medical History  Diagnosis Date  . CHF (congestive heart failure)   . Diabetes mellitus without complication   . COPD (chronic obstructive pulmonary disease)   . Hypertension   . Asthma   . Shortness of breath   . Stroke   . Anxiety   . 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 Pneumonia    Past Surgical History  Procedure Laterality Date  . Breast surgery    . Tubal ligation     History   Social History  . Marital Status: Legally Separated    Spouse Name: N/A    Number of Children: N/A  . Years of Education: N/A   Occupational History  . Not on file.   Social History Main Topics  . Smoking status: Never Smoker   . Smokeless tobacco: Not on file  . Alcohol Use: No  . Drug Use: No  . Sexually Active:    Other Topics Concern  . Not on file   Social History Narrative  . No narrative on file   No family history on file. Current Outpatient Prescriptions on File Prior to Visit  Medication Sig Dispense Refill  . acetaminophen (TYLENOL) 500 MG tablet Take 500 mg by mouth every 6 (six) hours as needed. For pain      . albuterol (PROVENTIL) (5 MG/ML) 0.5% nebulizer solution Take 0.5 mLs (2.5 mg total) by nebulization every 2 (two) hours as needed for wheezing or shortness of breath.  20 mL  1  . allopurinol (ZYLOPRIM) 300 MG tablet Take 300 mg by mouth every morning.      . baclofen (LIORESAL) 10 MG tablet Take 10 mg by mouth 3 (three) times daily.      . benzonatate (TESSALON) 200 MG capsule Take 200 mg by mouth 3 (three) times daily as needed.      . cefUROXime (CEFTIN) 500 MG tablet Take 1 tablet (500 mg total) by mouth 2 (two) times daily  with a meal.  10 tablet  0  . colesevelam (WELCHOL) 625 MG tablet Take 1,250 mg by mouth 2 (two) times daily with a meal.      . feeding supplement (GLUCERNA SHAKE) LIQD Take 237 mLs by mouth 3 (three) times daily between meals.  60 Can  1  . Fluticasone-Salmeterol (ADVAIR) 250-50 MCG/DOSE AEPB Inhale 1 puff into the lungs every 12 (twelve) hours. RINSE AFTERWARDS      . furosemide (LASIX) 20 MG tablet Take 1 tablet (20 mg total) by mouth daily as needed (LEG SWELLING).  30 tablet  0  . gabapentin (NEURONTIN) 400 MG capsule Take 400 mg by mouth every evening.      Marland Kitchen guaiFENesin (MUCINEX) 600 MG 12 hr tablet Take 2 tablets (1,200 mg total) by mouth 2 (two) times daily as needed.      Marland Kitchen HYDROcodone-acetaminophen (NORCO/VICODIN) 5-325 MG per tablet Take 1 tablet by mouth at bedtime. For pain      . loratadine (CLARITIN) 10 MG tablet Take 10 mg by mouth daily.      Marland Kitchen losartan (COZAAR) 25 MG tablet Take 25 mg by mouth daily.      Marland Kitchen  meclizine (ANTIVERT) 25 MG tablet Take 25 mg by mouth 3 (three) times daily as needed.      . metFORMIN (GLUCOPHAGE) 500 MG tablet Take 500 mg by mouth 2 (two) times daily with a meal.      . metoprolol tartrate (LOPRESSOR) 25 MG tablet Take 12.5 mg by mouth 2 (two) times daily.      . montelukast (SINGULAIR) 10 MG tablet Take 10 mg by mouth at bedtime.      Marland Kitchen oxyCODONE-acetaminophen (PERCOCET/ROXICET) 5-325 MG per tablet Take 1 tablet by mouth every 4 (four) hours as needed for pain.  20 tablet  0  . polyethylene glycol (MIRALAX / GLYCOLAX) packet Take 17 g by mouth daily.      . raloxifene (EVISTA) 60 MG tablet Take 60 mg by mouth daily.      . Tamsulosin HCl (FLOMAX) 0.4 MG CAPS Take 0.4 mg by mouth every evening.       No current facility-administered medications on file prior to visit.   Allergies  Allergen Reactions  . Ultram (Tramadol) Shortness Of Breath    There is no immunization history on file for this patient. Prior to Admission medications   Medication  Sig Start Date End Date Taking? Authorizing Provider  acetaminophen (TYLENOL) 500 MG tablet Take 500 mg by mouth every 6 (six) hours as needed. For pain   Yes Historical Provider, MD  albuterol (PROVENTIL) (5 MG/ML) 0.5% nebulizer solution Take 0.5 mLs (2.5 mg total) by nebulization every 2 (two) hours as needed for wheezing or shortness of breath. 06/12/12  Yes Tora Kindred York, PA-C  allopurinol (ZYLOPRIM) 300 MG tablet Take 300 mg by mouth every morning.   Yes Historical Provider, MD  baclofen (LIORESAL) 10 MG tablet Take 10 mg by mouth 3 (three) times daily.   Yes Historical Provider, MD  benzonatate (TESSALON) 200 MG capsule Take 200 mg by mouth 3 (three) times daily as needed.   Yes Historical Provider, MD  cefUROXime (CEFTIN) 500 MG tablet Take 1 tablet (500 mg total) by mouth 2 (two) times daily with a meal. 06/12/12  Yes Tora Kindred York, PA-C  colesevelam (WELCHOL) 625 MG tablet Take 1,250 mg by mouth 2 (two) times daily with a meal.   Yes Historical Provider, MD  feeding supplement (GLUCERNA SHAKE) LIQD Take 237 mLs by mouth 3 (three) times daily between meals. 06/12/12  Yes Marianne L York, PA-C  Fluticasone-Salmeterol (ADVAIR) 250-50 MCG/DOSE AEPB Inhale 1 puff into the lungs every 12 (twelve) hours. RINSE AFTERWARDS   Yes Historical Provider, MD  furosemide (LASIX) 20 MG tablet Take 1 tablet (20 mg total) by mouth daily as needed (LEG SWELLING). 06/12/12  Yes Marianne L York, PA-C  gabapentin (NEURONTIN) 400 MG capsule Take 400 mg by mouth every evening.   Yes Historical Provider, MD  guaiFENesin (MUCINEX) 600 MG 12 hr tablet Take 2 tablets (1,200 mg total) by mouth 2 (two) times daily as needed. 06/12/12  Yes Tora Kindred York, PA-C  HYDROcodone-acetaminophen (NORCO/VICODIN) 5-325 MG per tablet Take 1 tablet by mouth at bedtime. For pain   Yes Historical Provider, MD  loratadine (CLARITIN) 10 MG tablet Take 10 mg by mouth daily.   Yes Historical Provider, MD  losartan (COZAAR) 25 MG tablet Take  25 mg by mouth daily.   Yes Historical Provider, MD  meclizine (ANTIVERT) 25 MG tablet Take 25 mg by mouth 3 (three) times daily as needed.   Yes Historical Provider, MD  metFORMIN (GLUCOPHAGE) 500 MG tablet  Take 500 mg by mouth 2 (two) times daily with a meal.   Yes Historical Provider, MD  metoprolol tartrate (LOPRESSOR) 25 MG tablet Take 12.5 mg by mouth 2 (two) times daily.   Yes Historical Provider, MD  montelukast (SINGULAIR) 10 MG tablet Take 10 mg by mouth at bedtime.   Yes Historical Provider, MD  oxyCODONE-acetaminophen (PERCOCET/ROXICET) 5-325 MG per tablet Take 1 tablet by mouth every 4 (four) hours as needed for pain. 07/18/12  Yes Lyanne Co, MD  polyethylene glycol [facility] / Ethelene Hal) packet Take 17 g by mouth daily.   Yes Historical Provider, MD  raloxifene (EVISTA) 60 MG tablet Take 60 mg by mouth daily.   Yes Historical Provider, MD  Tamsulosin HCl (FLOMAX) 0.4 MG CAPS Take 0.4 mg by mouth every evening.   Yes Historical Provider, MD  warfarin (COUMADIN) 5 MG tablet Take 5 mg by mouth. 5mg  every day except 7.5mg  on Mondays   Yes Historical Provider, MD   Review of systems nothing is changed.  Objective Her general appearance is the same she is in a wheelchair. And she has right hemiplegia. BP 125/79  Pulse 90  Temp(Src) 98.2 F (36.8 C) (Oral)   Assessment  CVA (cerebral vascular accident) - Plan: Ambulatory referral to Anticoagulation Monitoring, POCT INR, POCT INR  History of CVA (cerebrovascular accident)  Plan Results for orders placed in visit on 08/15/12  POCT INR      Result Value Range   INR 2.9      [facility] reading (PRIMARY) by  Dr.   No change in Coumadin.  Repeat protime in 2 weeks and if stable will have her followed in anticoagulation clinic every 4 weeks.  Mekaila Tarnow P. Modesto Charon, M.D.

## 2012-08-20 ENCOUNTER — Other Ambulatory Visit: Payer: Self-pay

## 2012-08-20 MED ORDER — METOPROLOL TARTRATE 25 MG PO TABS: 12.5000 mg | ORAL_TABLET | Freq: Two times a day (BID) | ORAL | Status: DC

## 2012-08-27 ENCOUNTER — Telehealth: Payer: Self-pay | Admitting: Family Medicine

## 2012-08-27 NOTE — Telephone Encounter (Signed)
Rx written for platform.

## 2012-09-03 ENCOUNTER — Ambulatory Visit (INDEPENDENT_AMBULATORY_CARE_PROVIDER_SITE_OTHER): Payer: Medicare Other | Admitting: Pharmacist Clinician (PhC)/ Clinical Pharmacy Specialist

## 2012-09-03 DIAGNOSIS — I639 Cerebral infarction, unspecified: Secondary | ICD-10-CM

## 2012-09-03 DIAGNOSIS — I6350 Cerebral infarction due to unspecified occlusion or stenosis of unspecified cerebral artery: Secondary | ICD-10-CM

## 2012-09-03 LAB — POCT INR: INR: 4.4

## 2012-09-04 ENCOUNTER — Telehealth: Payer: Self-pay | Admitting: Family Medicine

## 2012-09-04 NOTE — Telephone Encounter (Signed)
Spoke with patient and caregiver about spot to heel. Has been there approx 2 weeks. No redness or pain. Advised that she needed to be seen but due to transportation she can not get here. Family wants to have her seen when she comes for her protime check on 4-22 because they will have transportation then. Gave appt for 4-22. Advised family and patient that if heel changes at all, becomes red, swollen, or painful to get to ER asap.

## 2012-09-10 ENCOUNTER — Ambulatory Visit (INDEPENDENT_AMBULATORY_CARE_PROVIDER_SITE_OTHER): Payer: Medicare Other | Admitting: Family Medicine

## 2012-09-10 ENCOUNTER — Ambulatory Visit (INDEPENDENT_AMBULATORY_CARE_PROVIDER_SITE_OTHER): Payer: Medicare Other

## 2012-09-10 ENCOUNTER — Encounter: Payer: Self-pay | Admitting: Family Medicine

## 2012-09-10 VITALS — BP 125/79 | HR 89 | Temp 99.2°F

## 2012-09-10 DIAGNOSIS — M25579 Pain in unspecified ankle and joints of unspecified foot: Secondary | ICD-10-CM

## 2012-09-10 DIAGNOSIS — E119 Type 2 diabetes mellitus without complications: Secondary | ICD-10-CM

## 2012-09-10 DIAGNOSIS — E1149 Type 2 diabetes mellitus with other diabetic neurological complication: Secondary | ICD-10-CM

## 2012-09-10 DIAGNOSIS — J449 Chronic obstructive pulmonary disease, unspecified: Secondary | ICD-10-CM

## 2012-09-10 DIAGNOSIS — Z8673 Personal history of transient ischemic attack (TIA), and cerebral infarction without residual deficits: Secondary | ICD-10-CM

## 2012-09-10 DIAGNOSIS — M109 Gout, unspecified: Secondary | ICD-10-CM

## 2012-09-10 DIAGNOSIS — I509 Heart failure, unspecified: Secondary | ICD-10-CM

## 2012-09-10 DIAGNOSIS — I10 Essential (primary) hypertension: Secondary | ICD-10-CM

## 2012-09-10 DIAGNOSIS — E11610 Type 2 diabetes mellitus with diabetic neuropathic arthropathy: Secondary | ICD-10-CM

## 2012-09-10 DIAGNOSIS — D649 Anemia, unspecified: Secondary | ICD-10-CM

## 2012-09-10 LAB — POCT CBC
Granulocyte percent: 60.9 %G (ref 37–80)
HCT, POC: 33.8 % — AB (ref 37.7–47.9)
Hemoglobin: 11.5 g/dL — AB (ref 12.2–16.2)
Lymph, poc: 2.3 (ref 0.6–3.4)
MCH, POC: 31.4 pg — AB (ref 27–31.2)
MCHC: 33.9 g/dL (ref 31.8–35.4)
MCV: 92.6 fL (ref 80–97)
MPV: 7.3 fL (ref 0–99.8)
POC Granulocyte: 4.3 (ref 2–6.9)
POC LYMPH PERCENT: 32.6 %L (ref 10–50)
Platelet Count, POC: 269 10*3/uL (ref 142–424)
RBC: 3.7 M/uL — AB (ref 4.04–5.48)
RDW, POC: 14.4 %
WBC: 7 10*3/uL (ref 4.6–10.2)

## 2012-09-10 LAB — POCT INR: INR: 2.3

## 2012-09-10 NOTE — Progress Notes (Signed)
Patient ID: Brittany Clarke, female   DOB: Jan 08, 1941, 72 y.o.   MRN: [id number] SUBJECTIVE: HPI: Came because the left heel is mushy. The right foot is more swollen painful and red and feels warm. No injury. Has been in PT & OT. Other medical problems are stable.  PMH/PSH: reviewed/updated in Epic  SH/FH: reviewed/updated in Epic  Allergies: reviewed/updated in Epic  Medications: reviewed/updated in Epic  Immunizations: reviewed/updated in Epic  ROS: As above in the HPI. All other systems are stable or negative.  OBJECTIVE: APPEARANCE:  Patient in no acute distress.The patient appeared well nourished and normally developed. Acyanotic. Waist: VITAL SIGNS:BP 125/79  Pulse 89  Temp(Src) 99.2 F (37.3 C) (Oral) Morbidly Obese in a wheelchair  SKIN: warm and  Dry without overt rashes, tattoos and scars  HEAD and Neck: without JVD, Head and scalp: normal Eyes:No scleral icterus. Fundi normal, eye movements normal. Ears: Auricle normal, canal normal, Tympanic membranes normal, insufflation normal. Nose: normal Throat: normal Neck & thyroid: normal  CHEST & LUNGS: Chest wall: normal Lungs: Clear  CVS: Reveals the PMI to be normally located. Regular rhythm, First and Second Heart sounds are normal,  absence of murmurs, rubs or gallops. Peripheral vasculature: Radial pulses: normal Dorsal pedis pulses: normal Posterior pulses: normal  ABDOMEN:  Appearance: normal Benign,, no organomegaly, no masses, no Abdominal Aortic enlargement. No Guarding , no rebound. No Bruits. Bowel sounds: normal  RECTAL: N/A GU: N/A  EXTREMETIES: edematous bilaterally 3 + Left heel is soft and mushy. Normal bone architecture is lost. Right foot is edema, has a erythematous color, no warmth, bones are tender to palpation. DP pulse is palpable 1+   NEUROLOGIC: oriented to time,place and person;right hemiplegia.  ASSESSMENT: Anemia - Plan: POCT CBC  CHF (congestive heart  failure)  COPD (chronic obstructive pulmonary disease)  DM (diabetes mellitus)  Gout - Plan: Uric acid  HTN (hypertension)  Morbid obesity  History of CVA (cerebrovascular accident) - Plan: POCT INR  Pain in joint, ankle and foot, unspecified laterality - Plan: POCT CBC, DG Foot Complete Right, DG Foot Complete Left  Gout, Chronic - Plan: Uric acid  Diabetic Charcot foot - Plan: Ambulatory referral to Orthopedic Surgery  With erosion of left heel.  PLAN:  Orders Placed This Encounter  Procedures  . DG Foot Complete Right    Standing Status: Future     Number of Occurrences: 1     Standing Expiration Date: 11/10/2013    Order Specific Question:  Reason for Exam (SYMPTOM  OR DIAGNOSIS REQUIRED)    Answer:  right foot pain    Order Specific Question:  Preferred imaging location?    Answer:  Internal  . DG Foot Complete Left    Standing Status: Future     Number of Occurrences: 1     Standing Expiration Date: 11/10/2013    Order Specific Question:  Reason for Exam (SYMPTOM  OR DIAGNOSIS REQUIRED)    Answer:  left heel    Order Specific Question:  Preferred imaging location?    Answer:  Internal  . Uric acid  . Ambulatory referral to Orthopedic Surgery    Referral Priority:  Routine    Referral Type:  Surgical    Referral Reason:  Specialty Services Required    Requested Specialty:  Orthopedic Surgery    Number of Visits Requested:  1  . POCT CBC  . POCT INR   Results for orders placed in visit on 09/10/12 (from the past 24 hour(s))  POCT INR     Status: None   Collection Time    09/10/12  3:03 PM      Result Value Range   INR 2.3    POCT CBC     Status: Abnormal   Collection Time    09/10/12  3:15 PM      Result Value Range   WBC 7.0  4.6 - 10.2 K/uL   Lymph, poc 2.3  0.6 - 3.4   POC LYMPH PERCENT 32.6  10 - 50 %L   POC Granulocyte 4.3  2 - 6.9   Granulocyte percent 60.9  37 - 80 %G   RBC 3.7 (*) 4.04 - 5.48 M/uL   Hemoglobin 11.5 (*) 12.2 - 16.2 g/dL    HCT, POC 96.0 (*) 45.4 - 47.9 %   MCV 92.6  80 - 97 fL   MCH, POC 31.4 (*) 27 - 31.2 pg   MCHC 33.9  31.8 - 35.4 g/dL   RDW, POC 09.8     Platelet Count, POC 269.0  142 - 424 K/uL   MPV 7.3  0 - 99.8 fL   No orders of the defined types were placed in this encounter.   Hand written Rx for sheepskin heel pads written and given to purchase at [employer] [facility]. Discussed with patient the problems  Associated with her comorbid conditions. Elevate legs. Hold PT because it requires standing. Continue with OT.  [facility] reading (PRIMARY) by  Dr. Leodis Sias: erosion of the left heel. Right foot degenerative changes. No osteomyelitis seen..  RTC as planned in 1 month.  pharmacist appointment for DM education?                             Arretta Toenjes P. Modesto Charon, M.D.

## 2012-09-11 ENCOUNTER — Telehealth: Payer: Self-pay | Admitting: Family Medicine

## 2012-09-11 LAB — URIC ACID: Uric Acid, Serum: 3.2 mg/dL (ref 2.4–6.0)

## 2012-09-11 NOTE — Telephone Encounter (Signed)
Note faxed to the number given stating appt tom. with [facility]. Ask daughter to leave Rx at [facility] and have them call us so we can discuss clarifications on Rx. Verbalized understanding.

## 2012-09-11 NOTE — Progress Notes (Signed)
Quick Note:  Call patient. Labs normal. No change in plan. ______ 

## 2012-09-11 NOTE — Telephone Encounter (Signed)
Please advise 

## 2012-09-12 NOTE — Telephone Encounter (Signed)
Spoke with Angie and according to lab on 09-10-12 "no change "  No further questions.

## 2012-09-16 ENCOUNTER — Other Ambulatory Visit: Payer: Self-pay | Admitting: Family Medicine

## 2012-09-16 ENCOUNTER — Telehealth: Payer: Self-pay | Admitting: Family Medicine

## 2012-09-16 DIAGNOSIS — H60393 Other infective otitis externa, bilateral: Secondary | ICD-10-CM

## 2012-09-16 MED ORDER — CEFUROXIME AXETIL 500 MG PO TABS: 500.0000 mg | ORAL_TABLET | Freq: Two times a day (BID) | ORAL | Status: DC

## 2012-09-16 MED ORDER — NEOMYCIN-POLYMYXIN-HC 3.5-10000-1 OT SOLN: 3.0000 [drp] | Freq: Four times a day (QID) | OTIC | Status: DC

## 2012-09-16 NOTE — Telephone Encounter (Signed)
Was sick with a URI. Having drainage coming out ear. No pain. Patient has poorly controlled DM. Will treat with an ear drops and oral ABx.

## 2012-10-15 ENCOUNTER — Ambulatory Visit (INDEPENDENT_AMBULATORY_CARE_PROVIDER_SITE_OTHER): Payer: Medicare Other | Admitting: Family Medicine

## 2012-10-15 ENCOUNTER — Encounter: Payer: Self-pay | Admitting: Family Medicine

## 2012-10-15 VITALS — BP 122/85 | HR 94 | Temp 98.6°F

## 2012-10-15 DIAGNOSIS — Z8673 Personal history of transient ischemic attack (TIA), and cerebral infarction without residual deficits: Secondary | ICD-10-CM

## 2012-10-15 DIAGNOSIS — J449 Chronic obstructive pulmonary disease, unspecified: Secondary | ICD-10-CM

## 2012-10-15 DIAGNOSIS — I10 Essential (primary) hypertension: Secondary | ICD-10-CM

## 2012-10-15 DIAGNOSIS — I509 Heart failure, unspecified: Secondary | ICD-10-CM

## 2012-10-15 DIAGNOSIS — R32 Unspecified urinary incontinence: Secondary | ICD-10-CM

## 2012-10-15 DIAGNOSIS — M109 Gout, unspecified: Secondary | ICD-10-CM

## 2012-10-15 DIAGNOSIS — J4489 Other specified chronic obstructive pulmonary disease: Secondary | ICD-10-CM

## 2012-10-15 DIAGNOSIS — M25579 Pain in unspecified ankle and joints of unspecified foot: Secondary | ICD-10-CM

## 2012-10-15 DIAGNOSIS — D649 Anemia, unspecified: Secondary | ICD-10-CM

## 2012-10-15 DIAGNOSIS — E785 Hyperlipidemia, unspecified: Secondary | ICD-10-CM

## 2012-10-15 DIAGNOSIS — E119 Type 2 diabetes mellitus without complications: Secondary | ICD-10-CM

## 2012-10-15 LAB — COMPLETE METABOLIC PANEL WITH GFR
ALT: 9 U/L (ref 0–35)
AST: 14 U/L (ref 0–37)
Albumin: 3.6 g/dL (ref 3.5–5.2)
Alkaline Phosphatase: 62 U/L (ref 39–117)
BUN: 11 mg/dL (ref 6–23)
CO2: 25 mEq/L (ref 19–32)
Calcium: 9.5 mg/dL (ref 8.4–10.5)
Chloride: 105 mEq/L (ref 96–112)
Creat: 0.47 mg/dL — ABNORMAL LOW (ref 0.50–1.10)
GFR, Est African American: >89 mL/min
GFR, Est Non African American: >89 mL/min
Glucose, Bld: 108 mg/dL — ABNORMAL HIGH (ref 70–99)
Potassium: 5.2 mEq/L (ref 3.5–5.3)
Sodium: 140 mEq/L (ref 135–145)
Total Bilirubin: 0.2 mg/dL — ABNORMAL LOW (ref 0.3–1.2)
Total Protein: 6.5 g/dL (ref 6.0–8.3)

## 2012-10-15 LAB — POCT CBC
Granulocyte percent: 64.9 %G (ref 37–80)
HCT, POC: 37.7 % (ref 37.7–47.9)
Hemoglobin: 12.3 g/dL (ref 12.2–16.2)
Lymph, poc: 2.3 (ref 0.6–3.4)
MCH, POC: 30.1 pg (ref 27–31.2)
MCHC: 32.6 g/dL (ref 31.8–35.4)
MCV: 92.4 fL (ref 80–97)
MPV: 7.5 fL (ref 0–99.8)
POC Granulocyte: 4.9 (ref 2–6.9)
POC LYMPH PERCENT: 29.8 %L (ref 10–50)
Platelet Count, POC: 282 10*3/uL (ref 142–424)
RBC: 4.1 M/uL (ref 4.04–5.48)
RDW, POC: 14.5 %
WBC: 7.6 10*3/uL (ref 4.6–10.2)

## 2012-10-15 LAB — POCT GLYCOSYLATED HEMOGLOBIN (HGB A1C): Hemoglobin A1C: 5.6

## 2012-10-15 LAB — URIC ACID: Uric Acid, Serum: 2.6 mg/dL (ref 2.4–6.0)

## 2012-10-15 LAB — POCT INR: INR: 2.4

## 2012-10-15 NOTE — Progress Notes (Signed)
Patient ID: Brittany Clarke, female   DOB: 05-03-1941, 72 y.o.   MRN: [id number] SUBJECTIVE: HPI:  Here for protime and follow up of other medical problems. CVA confined to a wheelchair Obesity: diet could be better.  Patient is here for follow up of Diabetes Mellitus: Symptoms of DM: Denies Nocturia ,Denies Urinary Frequency , denies Blurred vision ,deniesDizziness,denies.Dysuria,denies paresthesias, denies extremity pain or ulcers.Marland Kitchendenies chest pain. has had an annual eye exam. do check the feet. Does check CBGs. Average CBG:90-125 Denies episodes of hypoglycemia. Does have an emergency hypoglycemic plan. admits toCompliance with medications. Denies Problems with medications.  Tail bone irritated. Wants it checked. Family and caretaker says they put creams and are careful and see no break in skin.  PMH/PSH: reviewed/updated in Epic  SH/FH: reviewed/updated in Epic  Allergies: reviewed/updated in Epic  Medications: reviewed/updated in Epic  Immunizations: reviewed/updated in Epic  ROS: As above in the HPI. All other systems are stable or negative.  OBJECTIVE: APPEARANCE:  Patient in no acute distress.The patient appeared well nourished and normally developed. Acyanotic. Waist: VITAL SIGNS:BP 122/85  Pulse 94  Temp(Src) 98.6 F (37 C) (Oral) Obese AAF In a wheelchair  SKIN: warm and  Dry without overt rashes, tattoos and scars  HEAD and Neck: without JVD, Head and scalp: normal Eyes:No scleral icterus. Fundi normal, eye movements normal. Ears: Auricle normal, canal normal, Tympanic membranes normal, insufflation normal. Nose: normal Throat: normal Neck & thyroid: normal  CHEST & LUNGS: Chest wall: normal Lungs: Clear  CVS: Reveals the PMI to be normally located. Regular rhythm, First and Second Heart sounds are normal,  absence of murmurs, rubs or gallops. Peripheral vasculature: Radial pulses: normal Dorsal pedis pulses: normal Posterior pulses:  normal  ABDOMEN:  Appearance: obeseBenign,, no organomegaly, no masses, no Abdominal Aortic enlargement. No Guarding , no rebound. No Bruits. Bowel sounds: normal  RECTAL: N/A GU: N/A  EXTREMETIES:Soft and mushy left heel ; no breaks in skin.  MUSCULOSKELETAL:  Spine: normal Sacrum : no skin break.  NEUROLOGIC: oriented to time,place and person right hemiplegia   ASSESSMENT: Urinary incontinence  Morbid obesity  Pain in joint, ankle and foot, unspecified laterality  HTN (hypertension) - Plan: COMPLETE METABOLIC PANEL WITH GFR  History of CVA (cerebrovascular accident) - Plan: COMPLETE METABOLIC PANEL WITH GFR, POCT INR  Gout - Plan: COMPLETE METABOLIC PANEL WITH GFR, Uric acid  DM (diabetes mellitus) - Plan: POCT glycosylated hemoglobin (Hb A1C), COMPLETE METABOLIC PANEL WITH GFR  COPD (chronic obstructive pulmonary disease)  Anemia - Plan: POCT CBC  CHF (congestive heart failure)  HLD (hyperlipidemia) - Plan: NMR Lipoprofile with Lipids  Charcot foot.  PLAN: Use the doughnut seat. Orders Placed This Encounter  Procedures  . COMPLETE METABOLIC PANEL WITH GFR  . NMR Lipoprofile with Lipids  . Uric acid  . POCT CBC  . POCT glycosylated hemoglobin (Hb A1C)  . POCT INR   Results for orders placed in visit on 10/15/12  POCT INR      Result Value Range   INR 2.4     Continue same dose of warfarin.  Emphasize the need for using the donut seat.  RTC 4 weeks for Protime.  Continue the PT & OT.  Dietary changes needed and exercising in a chair.  Long term prognosis poor.  Columbia Pandey P. [place] Charon, M.D.

## 2012-10-17 LAB — NMR LIPOPROFILE WITH LIPIDS
Cholesterol, Total: 189 mg/dL
HDL Particle Number: 37 umol/L
HDL Size: 9.4 nm
HDL-C: 51 mg/dL
LDL (calc): 113 mg/dL — ABNORMAL HIGH
LDL Particle Number: 1592 nmol/L — ABNORMAL HIGH
LDL Size: 20.5 nm — ABNORMAL LOW
LP-IR Score: <25
Large HDL-P: 9.9 umol/L
Large VLDL-P: 1.1 nmol/L
Small LDL Particle Number: 941 nmol/L — ABNORMAL HIGH
Triglycerides: 124 mg/dL
VLDL Size: 40.7 nm

## 2012-10-21 ENCOUNTER — Telehealth: Payer: Self-pay | Admitting: Family Medicine

## 2012-10-21 NOTE — Telephone Encounter (Signed)
Called pt and informed pt needs appt with pharmascit Left mess on daughter phone as well to sch appt

## 2012-10-21 NOTE — Telephone Encounter (Signed)
Spoke with daughter and appt given with the pharmacist.

## 2012-11-12 ENCOUNTER — Ambulatory Visit (INDEPENDENT_AMBULATORY_CARE_PROVIDER_SITE_OTHER): Payer: Medicare Other | Admitting: Pharmacist Clinician (PhC)/ Clinical Pharmacy Specialist

## 2012-11-12 DIAGNOSIS — I639 Cerebral infarction, unspecified: Secondary | ICD-10-CM

## 2012-11-12 DIAGNOSIS — I6350 Cerebral infarction due to unspecified occlusion or stenosis of unspecified cerebral artery: Secondary | ICD-10-CM

## 2012-11-12 LAB — POCT CBC
Hemoglobin: 12 g/dL — AB (ref 12.2–16.2)
MCH, POC: 30.8 pg (ref 27–31.2)
MCHC: 33.6 g/dL (ref 31.8–35.4)
MCV: 91.9 fL (ref 80–97)
POC LYMPH PERCENT: 36 %L (ref 10–50)
RBC: 3.9 M/uL — AB (ref 4.04–5.48)

## 2012-11-12 LAB — POCT INR: INR: 1.9

## 2012-11-12 MED ORDER — EZETIMIBE-SIMVASTATIN 10-20 MG PO TABS: 1.0000 | ORAL_TABLET | Freq: Every day | ORAL | Status: DC

## 2012-11-12 NOTE — Progress Notes (Signed)
Stopped Evista on today's visit based on patient not having a diagnosis to substantiate treatment and her past history of blood clots and CVA.  Explained to patient risks involved with Evista, especially since she is wheelchair bound.   I can not determine if patient's breast cancer from 8 years ago was estrogen positive based on notes in EMR.  She thinks that she took arimidex but is not certain- I will need to do additional research.  Also stopped Wellchol due to chronic constipation and started vytorin 10/20.

## 2012-11-14 ENCOUNTER — Ambulatory Visit: Payer: Medicare Other | Admitting: Family Medicine

## 2012-11-15 ENCOUNTER — Ambulatory Visit (INDEPENDENT_AMBULATORY_CARE_PROVIDER_SITE_OTHER): Payer: Medicare Other | Admitting: Family Medicine

## 2012-11-15 ENCOUNTER — Encounter: Payer: Self-pay | Admitting: Family Medicine

## 2012-11-15 ENCOUNTER — Other Ambulatory Visit: Payer: Self-pay | Admitting: Family Medicine

## 2012-11-15 VITALS — BP 120/79 | HR 97 | Temp 98.5°F

## 2012-11-15 DIAGNOSIS — R1900 Intra-abdominal and pelvic swelling, mass and lump, unspecified site: Secondary | ICD-10-CM

## 2012-11-15 DIAGNOSIS — K625 Hemorrhage of anus and rectum: Secondary | ICD-10-CM

## 2012-11-15 DIAGNOSIS — K649 Unspecified hemorrhoids: Secondary | ICD-10-CM

## 2012-11-15 MED ORDER — HYDROCORTISONE ACETATE 25 MG RE SUPP: 25.0000 mg | Freq: Two times a day (BID) | RECTAL | Status: DC

## 2012-11-15 NOTE — Progress Notes (Signed)
  Subjective:    Patient ID: Tobe Sos, female    DOB: 1940-10-24, 72 y.o.   MRN: [id number]  HPI This 72 y.o. female presents for evaluation of bright red rectal bleeding for a.  few days last week.  She has been constipated and is not using miralax.  Her daughter who accompanies her reports that she is having some blood encircling her stool.  She has not noticed any for over a week.  She is on coumadin for hx of CVA.  She has had her inr checked this week and it was 1.9.  She did have a cbc which was normal.  She has been having some rash on her buttocks.  Her daughter who is caregiver states that she has been putting A&D ointment on it.  Her daughter reports that when she bathes her and she is lying recumbant she has a left pelvic mass that is rather firm. She states that she has noticed this for some time when she bathes her and wanted to have this checked out.  Review of Systems C/o constipation, rectal bleeding, and pelvic mass.  No chest pain, SOB, HA, dizziness, vision change, N/V, diarrhea, dysuria, urinary urgency or frequency, myalgias, arthralgias.     Objective:   Physical Exam  Vital signs noted  Well developed well nourished female.  HEENT - Head atraumatic Normocephalic                Eyes - PERRLA, Conjuctiva - clear Sclera- Clear EOMI                Throat - oropharanx wnl Respiratory - Lungs CTA bilateral Cardiac - RRR S1 and S2 without murmur GI - Abdomen Obese, soft, Nontender, no masses appreciated although exam is difficult with patient in sitting position and due to body habitus, and bowel sounds active x 4 Extremities - Anasarca edema in LE's Neuro - Right Hemiplegia. Skin - coccyx with macerate skin approx 4cm x 2cm.  No ulcers or rashes.      Assessment & Plan:  Hemorrhoids Anusol HC rectal supp bid  Pelvic mass - Plan: hydrocortisone (ANUSOL-HC) 25 MG suppository, US Transvaginal Non-OB  Rectal bleeding- Anusol HC and if no improvement the refer  to GI.  Difficult to examine in office due to inability to get out of chair and onto exam table.  Macerated skin over coccyx - moisture barrier to buttocks qd.

## 2012-11-15 NOTE — Patient Instructions (Addendum)
Hemorrhoids Hemorrhoids are swollen veins around the rectum or anus. There are two types of hemorrhoids:   Internal hemorrhoids. These occur in the veins just inside the rectum. They may poke through to the outside and become irritated and painful.  External hemorrhoids. These occur in the veins outside the anus and can be felt as a painful swelling or hard lump near the anus. CAUSES  Pregnancy.   Obesity.   Constipation or diarrhea.   Straining to have a bowel movement.   Sitting for long periods on the toilet.  Heavy lifting or other activity that caused you to strain.  Anal intercourse. SYMPTOMS   Pain.   Anal itching or irritation.   Rectal bleeding.   Fecal leakage.   Anal swelling.   One or more lumps around the anus.  DIAGNOSIS  Your caregiver may be able to diagnose hemorrhoids by visual examination. Other examinations or tests that may be performed include:   Examination of the rectal area with a gloved hand (digital rectal exam).   Examination of anal canal using a small tube (scope).   A blood test if you have lost a significant amount of blood.  A test to look inside the colon (sigmoidoscopy or colonoscopy). TREATMENT Most hemorrhoids can be treated at home. However, if symptoms do not seem to be getting better or if you have a lot of rectal bleeding, your caregiver may perform a procedure to help make the hemorrhoids get smaller or remove them completely. Possible treatments include:   Placing a rubber band at the base of the hemorrhoid to cut off the circulation (rubber band ligation).   Injecting a chemical to shrink the hemorrhoid (sclerotherapy).   Using a tool to burn the hemorrhoid (infrared light therapy).   Surgically removing the hemorrhoid (hemorrhoidectomy).   Stapling the hemorrhoid to block blood flow to the tissue (hemorrhoid stapling).  HOME CARE INSTRUCTIONS   Eat foods with fiber, such as whole grains, beans,  nuts, fruits, and vegetables. Ask your doctor about taking products with added fiber in them (fibersupplements).  Increase fluid intake. Drink enough water and fluids to keep your urine clear or pale yellow.   Exercise regularly.   Go to the bathroom when you have the urge to have a bowel movement. Do not wait.   Avoid straining to have bowel movements.   Keep the anal area dry and clean. Use wet toilet paper or moist towelettes after a bowel movement.   Medicated creams and suppositories may be used or applied as directed.   Only take over-the-counter or prescription medicines as directed by your caregiver.   Take warm sitz baths for 15 20 minutes, 3 4 times a day to ease pain and discomfort.   Place ice packs on the hemorrhoids if they are tender and swollen. Using ice packs between sitz baths may be helpful.   Put ice in a plastic bag.   Place a towel between your skin and the bag.   Leave the ice on for 15 20 minutes, 3 4 times a day.   Do not use a donut-shaped pillow or sit on the toilet for long periods. This increases blood pooling and pain.  SEEK MEDICAL CARE IF:  You have increasing pain and swelling that is not controlled by treatment or medicine.  You have uncontrolled bleeding.  You have difficulty or you are unable to have a bowel movement.  You have pain or inflammation outside the area of the hemorrhoids. MAKE SURE YOU:    Understand these instructions.  Will watch your condition.  Will get help right away if you are not doing well or get worse. Document Released: 05/05/2000 Document Revised: 04/24/2012 Document Reviewed: 03/12/2012 [employer] Patient Information 2014 [employer].  

## 2012-11-19 ENCOUNTER — Ambulatory Visit (HOSPITAL_COMMUNITY)
Admission: RE | Admit: 2012-11-19 | Discharge: 2012-11-19 | Disposition: A | Discharge status: Home / Self Care | Payer: PRIVATE HEALTH INSURANCE | Source: Ambulatory Visit | Attending: Family Medicine | Admitting: Family Medicine

## 2012-11-19 ENCOUNTER — Other Ambulatory Visit: Payer: Self-pay | Admitting: Family Medicine

## 2012-11-19 DIAGNOSIS — R1900 Intra-abdominal and pelvic swelling, mass and lump, unspecified site: Secondary | ICD-10-CM

## 2012-11-19 DIAGNOSIS — D259 Leiomyoma of uterus, unspecified: Secondary | ICD-10-CM | POA: Insufficient documentation

## 2012-11-25 ENCOUNTER — Emergency Department (HOSPITAL_COMMUNITY)
Admission: EM | Admit: 2012-11-25 | Discharge: 2012-11-25 | Disposition: A | Discharge status: Home / Self Care | Payer: Medicare Other | Attending: Emergency Medicine | Admitting: Emergency Medicine

## 2012-11-25 ENCOUNTER — Telehealth: Payer: Self-pay | Admitting: Family Medicine

## 2012-11-25 ENCOUNTER — Encounter (HOSPITAL_COMMUNITY): Payer: Self-pay | Admitting: *Deleted

## 2012-11-25 DIAGNOSIS — R197 Diarrhea, unspecified: Secondary | ICD-10-CM | POA: Insufficient documentation

## 2012-11-25 DIAGNOSIS — Z8673 Personal history of transient ischemic attack (TIA), and cerebral infarction without residual deficits: Secondary | ICD-10-CM | POA: Insufficient documentation

## 2012-11-25 DIAGNOSIS — I10 Essential (primary) hypertension: Secondary | ICD-10-CM | POA: Insufficient documentation

## 2012-11-25 DIAGNOSIS — I509 Heart failure, unspecified: Secondary | ICD-10-CM | POA: Insufficient documentation

## 2012-11-25 DIAGNOSIS — Z8701 Personal history of pneumonia (recurrent): Secondary | ICD-10-CM | POA: Insufficient documentation

## 2012-11-25 DIAGNOSIS — Z853 Personal history of malignant neoplasm of breast: Secondary | ICD-10-CM | POA: Insufficient documentation

## 2012-11-25 DIAGNOSIS — Z7901 Long term (current) use of anticoagulants: Secondary | ICD-10-CM | POA: Insufficient documentation

## 2012-11-25 DIAGNOSIS — R609 Edema, unspecified: Secondary | ICD-10-CM | POA: Insufficient documentation

## 2012-11-25 DIAGNOSIS — K625 Hemorrhage of anus and rectum: Secondary | ICD-10-CM | POA: Insufficient documentation

## 2012-11-25 DIAGNOSIS — Z8659 Personal history of other mental and behavioral disorders: Secondary | ICD-10-CM | POA: Insufficient documentation

## 2012-11-25 DIAGNOSIS — J449 Chronic obstructive pulmonary disease, unspecified: Secondary | ICD-10-CM | POA: Insufficient documentation

## 2012-11-25 DIAGNOSIS — J4489 Other specified chronic obstructive pulmonary disease: Secondary | ICD-10-CM | POA: Insufficient documentation

## 2012-11-25 DIAGNOSIS — K921 Melena: Secondary | ICD-10-CM | POA: Insufficient documentation

## 2012-11-25 DIAGNOSIS — E119 Type 2 diabetes mellitus without complications: Secondary | ICD-10-CM | POA: Insufficient documentation

## 2012-11-25 DIAGNOSIS — IMO0002 Reserved for concepts with insufficient information to code with codable children: Secondary | ICD-10-CM | POA: Insufficient documentation

## 2012-11-25 DIAGNOSIS — Z79899 Other long term (current) drug therapy: Secondary | ICD-10-CM | POA: Insufficient documentation

## 2012-11-25 LAB — CBC WITH DIFFERENTIAL/PLATELET
Lymphocytes Relative: 37 % (ref 12–46)
Lymphs Abs: 2.6 10*3/uL (ref 0.7–4.0)
Neutro Abs: 3.5 10*3/uL (ref 1.7–7.7)
Neutrophils Relative %: 51 % (ref 43–77)
Platelets: 253 10*3/uL (ref 150–400)
RBC: 3.96 MIL/uL (ref 3.87–5.11)
WBC: 7 10*3/uL (ref 4.0–10.5)

## 2012-11-25 LAB — BASIC METABOLIC PANEL
CO2: 26 mEq/L (ref 19–32)
Glucose, Bld: 114 mg/dL — ABNORMAL HIGH (ref 70–99)
Potassium: 3.6 mEq/L (ref 3.5–5.1)
Sodium: 140 mEq/L (ref 135–145)

## 2012-11-25 LAB — PROTIME-INR
INR: 2.29 — ABNORMAL HIGH (ref 0.00–1.49)
Prothrombin Time: 24.5 seconds — ABNORMAL HIGH (ref 11.6–15.2)

## 2012-11-25 NOTE — Telephone Encounter (Signed)
Spoke with pt and she is aware to tell son when get in for work to take her to the ED.  Pt verbalized undersatanding and has stopped the meds for constipation.

## 2012-11-25 NOTE — ED Notes (Signed)
States she takes coumadin and has been having bloody stools for the past 2 weeks

## 2012-11-25 NOTE — Telephone Encounter (Signed)
She is on coumadin. With the  Rectal bleeding needs to get checked in the ED. Stop the meds for the constipation.

## 2012-11-25 NOTE — ED Provider Notes (Signed)
History    This chart was scribed for Brittany Lennert, MD, by Frederik Pear, ED scribe. The patient was seen in room [facility]14/[facility]14 and the patient's care was started at 2023.   CSN: [id number] Arrival date & time 11/25/12  2013  First MD Initiated Contact with Patient 11/25/12 2023     Chief Complaint  Patient presents with  . Rectal Bleeding   (Consider location/radiation/quality/duration/timing/severity/associated sxs/prior Treatment) Patient is a 72 y.o. female presenting with hematochezia. The history is provided by the patient and medical records. No language interpreter was used.  Rectal Bleeding Quality:  Bright red and maroon Amount:  Unable to specify Similar prior episodes: yes   Worsened by:  Nothing tried Ineffective treatments:  None tried Associated symptoms: no abdominal pain     HPI Comments: Brittany Clarke is a 72 y.o. female who presents to the Emergency Department complaining of sudden onset rectal bleeding that began 2 weeks ago. She reports that she has intermittently seen solid dark red stools and solid with bright red stools. She reports that she went to see her PCP on 06/27 after complaining of rectal bleeding and constipation and was discharged with Linzess. She reports once she started taking the medication that began having diarrhea, which has persisted over the past week. She reports she currently takes coumadin, but is unsure why she takes it. She reports she came to the ED after her PCP called her tonight and advised her to come to the ED and discontinue the Linzess.   PCP is Dr. Modesto Charon.  Past Medical History  Diagnosis Date  . CHF (congestive heart failure)   . Diabetes mellitus without complication   . COPD (chronic obstructive pulmonary disease)   . Hypertension   . Asthma   . Shortness of breath   . Stroke   . Anxiety   . 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 Pneumonia    Past Surgical History  Procedure Laterality  Date  . Breast surgery    . Tubal ligation     No family history on file. History  Substance Use Topics  . Smoking status: Never Smoker   . Smokeless tobacco: Not on file  . Alcohol Use: No   OB History   Grav Para Term Preterm Abortions TAB SAB Ect Mult Living                 Review of Systems  Constitutional: Negative for appetite change and fatigue.  HENT: Negative for congestion, sinus pressure and ear discharge.   Eyes: Negative for discharge.  Respiratory: Negative for cough.   Cardiovascular: Negative for chest pain.  Gastrointestinal: Positive for diarrhea and hematochezia. Negative for abdominal pain.  Genitourinary: Negative for frequency and hematuria.  Musculoskeletal: Negative for back pain.  Skin: Negative for rash.  Neurological: Negative for seizures and headaches.  Psychiatric/Behavioral: Negative for hallucinations.    Allergies  Ultram  Home Medications   Current Outpatient Rx  Name  Route  Sig  Dispense  Refill  . acetaminophen (TYLENOL) 500 MG tablet   Oral   Take 500 mg by mouth every 6 (six) hours as needed. For pain         . albuterol (PROVENTIL) (5 MG/ML) 0.5% nebulizer solution   Nebulization   Take 0.5 mLs (2.5 mg total) by nebulization every 2 (two) hours as needed for wheezing or shortness of breath.   20 mL   1   .  allopurinol (ZYLOPRIM) 300 MG tablet   Oral   Take 300 mg by mouth every morning.         . baclofen (LIORESAL) 10 MG tablet   Oral   Take 10 mg by mouth 3 (three) times daily.         . benzonatate (TESSALON) 200 MG capsule   Oral   Take 200 mg by mouth 3 (three) times daily as needed.         . cefUROXime (CEFTIN) 500 MG tablet   Oral   Take 1 tablet (500 mg total) by mouth 2 (two) times daily with a meal.   10 tablet   0   . ezetimibe-simvastatin (VYTORIN) 10-20 MG per tablet   Oral   Take 1 tablet by mouth at bedtime.   30 tablet   5   . feeding supplement (GLUCERNA SHAKE) LIQD   Oral   Take  237 mLs by mouth 3 (three) times daily between meals.   60 Can   1   . Fluticasone-Salmeterol (ADVAIR) 250-50 MCG/DOSE AEPB   Inhalation   Inhale 1 puff into the lungs every 12 (twelve) hours. RINSE AFTERWARDS         . furosemide (LASIX) 20 MG tablet   Oral   Take 1 tablet (20 mg total) by mouth daily as needed (LEG SWELLING).   30 tablet   0   . gabapentin (NEURONTIN) 400 MG capsule   Oral   Take 400 mg by mouth every evening.         Marland Kitchen guaiFENesin (MUCINEX) 600 MG 12 hr tablet   Oral   Take 2 tablets (1,200 mg total) by mouth 2 (two) times daily as needed.         Marland Kitchen HYDROcodone-acetaminophen (NORCO/VICODIN) 5-325 MG per tablet   Oral   Take 1 tablet by mouth at bedtime. For pain         . hydrocortisone (ANUSOL-HC) 25 MG suppository   Rectal   Place 1 suppository (25 mg total) rectally 2 (two) times daily.   12 suppository   3   . loratadine (CLARITIN) 10 MG tablet   Oral   Take 10 mg by mouth daily.         Marland Kitchen losartan (COZAAR) 25 MG tablet   Oral   Take 25 mg by mouth daily.         . meclizine (ANTIVERT) 25 MG tablet   Oral   Take 25 mg by mouth 3 (three) times daily as needed.         . metFORMIN (GLUCOPHAGE) 500 MG tablet   Oral   Take 500 mg by mouth 2 (two) times daily with a meal.         . metoprolol tartrate (LOPRESSOR) 25 MG tablet   Oral   Take 0.5 tablets (12.5 mg total) by mouth 2 (two) times daily.   30 tablet   5   . montelukast (SINGULAIR) 10 MG tablet   Oral   Take 10 mg by mouth at bedtime.         Marland Kitchen neomycin-polymyxin-hydrocortisone (CORTISPORIN) otic solution   Both Ears   Place 3 drops into both ears 4 (four) times daily.   10 mL   0   . oxyCODONE-acetaminophen (PERCOCET/ROXICET) 5-325 MG per tablet   Oral   Take 1 tablet by mouth every 4 (four) hours as needed for pain.   20 tablet   0   . polyethylene glycol (  MIRALAX / GLYCOLAX) packet   Oral   Take 17 g by mouth daily.         . Tamsulosin HCl  (FLOMAX) 0.4 MG CAPS   Oral   Take 0.4 mg by mouth every evening.         . warfarin (COUMADIN) 5 MG tablet   Oral   Take 5 mg by mouth. 5mg  every day except 7.5mg  on Mondays          BP 119/64  Pulse 117  Temp(Src) 98.7 F (37.1 C) (Oral)  Resp 18  Ht 5\' 5"  (1.651 m)  Wt 250 lb (113.399 kg)  BMI 41.6 kg/m2  SpO2 98% Physical Exam  Nursing note and vitals reviewed. Constitutional: She is oriented to person, place, and time. She appears well-developed.  HENT:  Head: Normocephalic.  Eyes: Conjunctivae and EOM are normal. No scleral icterus.  Neck: Neck supple. No thyromegaly present.  Cardiovascular: Normal rate and regular rhythm.  Exam reveals no gallop and no friction rub.   No murmur heard. Pulmonary/Chest: No stridor. She has no wheezes. She has no rales. She exhibits no tenderness.  Abdominal: She exhibits no distension. There is no tenderness. There is no rebound.  Genitourinary:  Chaperoned rectal exam. Heme negative.  Musculoskeletal: Normal range of motion. She exhibits edema.  2+ edema in bilateral legs.  Lymphadenopathy:    She has no cervical adenopathy.  Neurological: She is oriented to person, place, and time. Coordination normal.  Skin: No rash noted. No erythema.  Psychiatric: She has a normal mood and affect. Her behavior is normal.    ED Course  Procedures (including critical care time)  DIAGNOSTIC STUDIES: Oxygen Saturation is 98% on room air, normal by my interpretation.    COORDINATION OF CARE:  20:45- Discussed planned course of treatment with the patient, including a medication consult with pharmacy, basic metabolic panel, CBC with differential, and Protime-INR, who is agreeable at this time.  21:57- Discussed blood work and performed rectal exam. Discussed following up with Dr. Modesto Charon within the week. She is agreeable at this time.   Results for orders placed during the hospital encounter of 11/25/12  CBC WITH DIFFERENTIAL      Result  Value Range   WBC 7.0  4.0 - 10.5 K/uL   RBC 3.96  3.87 - 5.11 MIL/uL   Hemoglobin 11.8 (*) 12.0 - 15.0 g/dL   HCT 60.4  54.0 - 98.1 %   MCV 91.7  78.0 - 100.0 fL   MCH 29.8  26.0 - 34.0 pg   MCHC 32.5  30.0 - 36.0 g/dL   RDW 19.1  47.8 - 29.5 %   Platelets 253  150 - 400 K/uL   Neutrophils Relative % 51  43 - 77 %   Neutro Abs 3.5  1.7 - 7.7 K/uL   Lymphocytes Relative 37  12 - 46 %   Lymphs Abs 2.6  0.7 - 4.0 K/uL   Monocytes Relative 7  3 - 12 %   Monocytes Absolute 0.5  0.1 - 1.0 K/uL   Eosinophils Relative 5  0 - 5 %   Eosinophils Absolute 0.3  0.0 - 0.7 K/uL   Basophils Relative 1  0 - 1 %   Basophils Absolute 0.0  0.0 - 0.1 K/uL  BASIC METABOLIC PANEL      Result Value Range   Sodium 140  135 - 145 mEq/L   Potassium 3.6  3.5 - 5.1 mEq/L   Chloride 103  96 - 112 mEq/L   CO2 26  19 - 32 mEq/L   Glucose, Bld 114 (*) 70 - 99 mg/dL   BUN 15  6 - 23 mg/dL   Creatinine, Ser 8.31  0.50 - 1.10 mg/dL   Calcium 9.6  8.4 - 51.7 mg/dL   GFR calc non Af Amer >90  >90 mL/min   GFR calc Af Amer >90  >90 mL/min  PROTIME-INR      Result Value Range   Prothrombin Time 24.5 (*) 11.6 - 15.2 seconds   INR 2.29 (*) 0.00 - 1.49   Labs Reviewed - No data to display No results found. No diagnosis found.  MDM  Hx of rectal bleeding.  Neg rectal.  Will follow up  The chart was scribed for me under my direct supervision.  I personally performed the history, physical, and medical decision making and all procedures in the evaluation of this patient.Brittany Lennert, MD 11/25/12 2201

## 2012-11-26 ENCOUNTER — Telehealth: Payer: Self-pay | Admitting: Family Medicine

## 2012-11-26 MED ORDER — GABAPENTIN 400 MG PO CAPS: 400.0000 mg | ORAL_CAPSULE | Freq: Every evening | ORAL | Status: DC

## 2012-11-26 NOTE — Telephone Encounter (Signed)
Spoke with pt advised to use imodium and stop glucerna until diarrhea subsides. Pt verbalized understanding. Advised pt call if diarrhea does not get better

## 2012-11-26 NOTE — Telephone Encounter (Signed)
Ok to use Imodium. FW

## 2012-11-26 NOTE — Telephone Encounter (Signed)
APPT MADE

## 2012-11-26 NOTE — Telephone Encounter (Signed)
Last seen 11/15/12 Ander Slade   If approved print and have nurse call patient to pick up

## 2012-11-26 NOTE — Telephone Encounter (Signed)
States went to ER and everything ok. Wants something for diarrhea States been 3x today loose watery stools. Completed med for supp for hemorrhoids and has called for the refill  please advise

## 2012-11-26 NOTE — Telephone Encounter (Signed)
APPT RESCHEDULE

## 2012-11-27 ENCOUNTER — Other Ambulatory Visit: Payer: Self-pay | Admitting: Family Medicine

## 2012-11-27 NOTE — Telephone Encounter (Signed)
PT CALLED TO CHECK ON REFILL. 

## 2012-11-28 NOTE — Telephone Encounter (Signed)
Yes I would start with imodium and follow up if not better.

## 2012-11-29 MED ORDER — HYDROCODONE-ACETAMINOPHEN 5-325 MG PO TABS: 1.0000 | ORAL_TABLET | Freq: Every day | ORAL | Status: DC

## 2012-11-29 NOTE — Telephone Encounter (Signed)
Pt was given this in hospital. See notes, wants a refill. If approved Rx will print, have Almira Coaster H  Call pt to pick up

## 2012-12-02 ENCOUNTER — Encounter: Payer: Self-pay | Admitting: Family Medicine

## 2012-12-02 ENCOUNTER — Ambulatory Visit: Payer: Medicare Other | Admitting: Family Medicine

## 2012-12-02 ENCOUNTER — Ambulatory Visit (INDEPENDENT_AMBULATORY_CARE_PROVIDER_SITE_OTHER): Payer: Medicare Other | Admitting: Family Medicine

## 2012-12-02 VITALS — BP 108/77 | HR 96 | Temp 98.1°F

## 2012-12-02 DIAGNOSIS — I509 Heart failure, unspecified: Secondary | ICD-10-CM

## 2012-12-02 DIAGNOSIS — E785 Hyperlipidemia, unspecified: Secondary | ICD-10-CM

## 2012-12-02 DIAGNOSIS — H9202 Otalgia, left ear: Secondary | ICD-10-CM

## 2012-12-02 DIAGNOSIS — E119 Type 2 diabetes mellitus without complications: Secondary | ICD-10-CM

## 2012-12-02 DIAGNOSIS — M109 Gout, unspecified: Secondary | ICD-10-CM

## 2012-12-02 DIAGNOSIS — D649 Anemia, unspecified: Secondary | ICD-10-CM

## 2012-12-02 DIAGNOSIS — R600 Localized edema: Secondary | ICD-10-CM

## 2012-12-02 DIAGNOSIS — Z8719 Personal history of other diseases of the digestive system: Secondary | ICD-10-CM

## 2012-12-02 DIAGNOSIS — K5909 Other constipation: Secondary | ICD-10-CM | POA: Insufficient documentation

## 2012-12-02 DIAGNOSIS — R609 Edema, unspecified: Secondary | ICD-10-CM

## 2012-12-02 DIAGNOSIS — R300 Dysuria: Secondary | ICD-10-CM

## 2012-12-02 DIAGNOSIS — I10 Essential (primary) hypertension: Secondary | ICD-10-CM

## 2012-12-02 DIAGNOSIS — Z853 Personal history of malignant neoplasm of breast: Secondary | ICD-10-CM

## 2012-12-02 DIAGNOSIS — Z8673 Personal history of transient ischemic attack (TIA), and cerebral infarction without residual deficits: Secondary | ICD-10-CM

## 2012-12-02 DIAGNOSIS — H9209 Otalgia, unspecified ear: Secondary | ICD-10-CM

## 2012-12-02 DIAGNOSIS — K5900 Constipation, unspecified: Secondary | ICD-10-CM

## 2012-12-02 LAB — POCT URINALYSIS DIPSTICK
Bilirubin, UA: NEGATIVE
Glucose, UA: NEGATIVE
Ketones, UA: NEGATIVE
Nitrite, UA: NEGATIVE
Protein, UA: NEGATIVE
Spec Grav, UA: <=1.005
Urobilinogen, UA: NEGATIVE
pH, UA: 7.5

## 2012-12-02 LAB — POCT UA - MICROSCOPIC ONLY
Bacteria, U Microscopic: NEGATIVE
Casts, Ur, LPF, POC: NEGATIVE
Crystals, Ur, HPF, POC: NEGATIVE
Mucus, UA: NEGATIVE
Yeast, UA: NEGATIVE

## 2012-12-02 MED ORDER — FUROSEMIDE 20 MG PO TABS: 20.0000 mg | ORAL_TABLET | Freq: Every day | ORAL | Status: DC | PRN

## 2012-12-02 MED ORDER — NEOMYCIN-POLYMYXIN-HC 3.5-10000-1 OT SUSP: 3.0000 [drp] | Freq: Four times a day (QID) | OTIC | Status: DC

## 2012-12-02 MED ORDER — POLYETHYLENE GLYCOL 3350 17 G PO PACK: 17.0000 g | PACK | Freq: Every day | ORAL | Status: DC

## 2012-12-02 MED ORDER — EZETIMIBE-SIMVASTATIN 10-20 MG PO TABS: 1.0000 | ORAL_TABLET | Freq: Every day | ORAL | Status: DC

## 2012-12-02 NOTE — Progress Notes (Signed)
Patient ID: Brittany Clarke, female   DOB: 1941-03-03, 72 y.o.   MRN: [id number] SUBJECTIVE: CC: Chief Complaint  Patient presents with  . Hospitalization Follow-up    follow up was at [facility] for rectal bld.  refill vytorin, and miralax  HPI: Had evaluation for  Rectal bleeding thought to be due to hemorrhoids and  Constipation. Put on Linzess and had too much diarrhea. The linzess is  Stopped and so did the diarhhea. Also, having frequncy of urination. CHF stable DM is  Stable as well  Past Medical History  Diagnosis Date  . CHF (congestive heart failure)   . Diabetes mellitus without complication   . COPD (chronic obstructive pulmonary disease)   . Hypertension   . Asthma   . Shortness of breath   . Stroke   . Anxiety   . 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 Pneumonia    Past Surgical History  Procedure Laterality Date  . Breast surgery    . Tubal ligation     History   Social History  . Marital Status: Legally Separated    Spouse Name: N/A    Number of Children: N/A  . Years of Education: N/A   Occupational History  . Not on file.   Social History Main Topics  . Smoking status: Never Smoker   . Smokeless tobacco: Not on file  . Alcohol Use: No  . Drug Use: No  . Sexually Active:    Other Topics Concern  . Not on file   Social History Narrative  . No narrative on file   No family history on file. Current Outpatient Prescriptions on File Prior to Visit  Medication Sig Dispense Refill  . acetaminophen (TYLENOL) 500 MG tablet Take 500 mg by mouth every 6 (six) hours as needed. For pain      . albuterol (PROVENTIL) (5 MG/ML) 0.5% nebulizer solution Take 0.5 mLs (2.5 mg total) by nebulization every 2 (two) hours as needed for wheezing or shortness of breath.  20 mL  1  . allopurinol (ZYLOPRIM) 300 MG tablet Take 300 mg by mouth every morning.      . baclofen (LIORESAL) 10 MG tablet Take 10 mg by mouth 3 (three) times daily.      .  feeding supplement (GLUCERNA SHAKE) LIQD Take 237 mLs by mouth 3 (three) times daily between meals.  60 Can  1  . gabapentin (NEURONTIN) 400 MG capsule Take 1 capsule (400 mg total) by mouth every evening.  30 capsule  2  . HYDROcodone-acetaminophen (NORCO/VICODIN) 5-325 MG per tablet Take 1 tablet by mouth at bedtime. For pain  30 tablet  1  . loratadine (CLARITIN) 10 MG tablet Take 10 mg by mouth daily.      Marland Kitchen losartan (COZAAR) 25 MG tablet Take 25 mg by mouth daily.      . metFORMIN (GLUCOPHAGE) 500 MG tablet Take 500 mg by mouth 2 (two) times daily with a meal.      . metoprolol tartrate (LOPRESSOR) 25 MG tablet Take 0.5 tablets (12.5 mg total) by mouth 2 (two) times daily.  30 tablet  5  . montelukast (SINGULAIR) 10 MG tablet Take 10 mg by mouth at bedtime.      Marland Kitchen warfarin (COUMADIN) 5 MG tablet Take 5 mg by mouth daily.        No current facility-administered medications on file prior to visit.   Allergies  Allergen Reactions  .  Ultram (Tramadol) Shortness Of Breath    There is no immunization history on file for this patient. Prior to Admission medications   Medication Sig Start Date End Date Taking? Authorizing Provider  acetaminophen (TYLENOL) 500 MG tablet Take 500 mg by mouth every 6 (six) hours as needed. For pain   Yes Historical Provider, MD  albuterol (PROVENTIL) (5 MG/ML) 0.5% nebulizer solution Take 0.5 mLs (2.5 mg total) by nebulization every 2 (two) hours as needed for wheezing or shortness of breath. 06/12/12  Yes Tora Kindred York, PA-C  allopurinol (ZYLOPRIM) 300 MG tablet Take 300 mg by mouth every morning.   Yes Historical Provider, MD  baclofen (LIORESAL) 10 MG tablet Take 10 mg by mouth 3 (three) times daily.   Yes Historical Provider, MD  ezetimibe-simvastatin (VYTORIN) 10-20 MG per tablet Take 1 tablet by mouth daily. 12/02/12  Yes Ileana Ladd, MD  feeding supplement (GLUCERNA SHAKE) LIQD Take 237 mLs by mouth 3 (three) times daily between meals. 06/12/12  Yes  Marianne L York, PA-C  gabapentin (NEURONTIN) 400 MG capsule Take 1 capsule (400 mg total) by mouth every evening. 11/26/12  Yes Deatra Canter, FNP  HYDROcodone-acetaminophen (NORCO/VICODIN) 5-325 MG per tablet Take 1 tablet by mouth at bedtime. For pain 11/29/12  Yes Ileana Ladd, MD  loratadine (CLARITIN) 10 MG tablet Take 10 mg by mouth daily.   Yes Historical Provider, MD  losartan (COZAAR) 25 MG tablet Take 25 mg by mouth daily.   Yes Historical Provider, MD  metFORMIN (GLUCOPHAGE) 500 MG tablet Take 500 mg by mouth 2 (two) times daily with a meal.   Yes Historical Provider, MD  metoprolol tartrate (LOPRESSOR) 25 MG tablet Take 0.5 tablets (12.5 mg total) by mouth 2 (two) times daily. 08/20/12  Yes Ileana Ladd, MD  montelukast (SINGULAIR) 10 MG tablet Take 10 mg by mouth at bedtime.   Yes Historical Provider, MD  polyethylene glycol (MIRALAX / GLYCOLAX) packet Take 17 g by mouth daily. 12/02/12  Yes Ileana Ladd, MD  warfarin (COUMADIN) 5 MG tablet Take 5 mg by mouth daily.    Yes Historical Provider, MD  furosemide (LASIX) 20 MG tablet Take 1 tablet (20 mg total) by mouth daily as needed. 12/02/12   Ileana Ladd, MD  neomycin-polymyxin-hydrocortisone (CORTISPORIN) 3.5-10000-1 otic suspension Place 3 drops into the left ear 4 (four) times daily. 12/02/12   Ileana Ladd, MD    ROS: As above in the HPI. All other systems are stable or negative.  OBJECTIVE: APPEARANCE:  Patient in no acute distress.The patient appeared well nourished and normally developed. Acyanotic. Waist: VITAL SIGNS:BP 108/77  Pulse 96  Temp(Src) 98.1 F (36.7 C) (Oral) Morbidly obese AAF wheelchair bound with right hemiplegia.  SKIN: warm and  Dry without overt rashes, tattoos and scars  HEAD and Neck: without JVD, Head and scalp: normal Eyes:No scleral icterus. Fundi normal, eye movements normal. Ears: Auricle normal, canal normal, Tympanic membranes normal, insufflation normal. Nose: normal Throat:  normal Neck & thyroid: normal  CHEST & LUNGS: Chest wall: normal Lungs: Clear  CVS: Reveals the PMI to be normally located. Regular rhythm, First and Second Heart sounds are normal,  absence of murmurs, rubs or gallops.  ABDOMEN:  Appearance:obese Benign, no organomegaly, no masses, no Abdominal Aortic enlargement. No Guarding , no rebound. No Bruits. Bowel sounds: normal  RECTAL: N/A GU: N/A  EXTREMETIES: 2+edematous.   NEUROLOGIC: oriented to time,place and person; right hemiplegia.   Results for orders  placed in visit on 12/02/12  POCT URINALYSIS DIPSTICK      Result Value Range   Color, UA yellow     Clarity, UA clear     Glucose, UA negatiave     Bilirubin, UA negataive     Ketones, UA negative     Spec Grav, UA <=1.005     Blood, UA trace     pH, UA 7.5     Protein, UA negative     Urobilinogen, UA negative     Nitrite, UA negative     Leukocytes, UA Trace    POCT UA - MICROSCOPIC ONLY      Result Value Range   WBC, Ur, HPF, POC 1-5     RBC, urine, microscopic 5-10     Bacteria, U Microscopic negative     Mucus, UA negative     Epithelial cells, urine per micros occ     Crystals, Ur, HPF, POC negative     Casts, Ur, LPF, POC negative     Yeast, UA negative      ASSESSMENT: History of CVA (cerebrovascular accident) - Plan: ezetimibe-simvastatin (VYTORIN) 10-20 MG per tablet  HTN (hypertension)  Morbid obesity - Plan: ezetimibe-simvastatin (VYTORIN) 10-20 MG per tablet  DM (diabetes mellitus) - Plan: ezetimibe-simvastatin (VYTORIN) 10-20 MG per tablet  History of breast cancer  Otalgia of left ear - itching - Plan: neomycin-polymyxin-hydrocortisone (CORTISPORIN) 3.5-10000-1 otic suspension  Gout  CHF (congestive heart failure), unspecified failure chronicity, unspecified type  Anemia  History of rectal bleeding  Pedal edema - Plan: furosemide (LASIX) 20 MG tablet  Chronic constipation - Plan: polyethylene glycol (MIRALAX / GLYCOLAX)  packet  HLD (hyperlipidemia) - Plan: ezetimibe-simvastatin (VYTORIN) 10-20 MG per tablet  Dysuria - Plan: POCT urinalysis dipstick, POCT UA - Microscopic Only, Urine culture  PLAN: Orders Placed This Encounter  Procedures  . Urine culture  . POCT urinalysis dipstick  . POCT UA - Microscopic Only   Letter for her new apartment to allow 24 hour caretaker.  Patient stable. No other changesin meds. Meds ordered this encounter  Medications  . DISCONTD: polyethylene glycol (MIRALAX / GLYCOLAX) packet    Sig: Take 17 g by mouth daily.  Marland Kitchen neomycin-polymyxin-hydrocortisone (CORTISPORIN) 3.5-10000-1 otic suspension    Sig: Place 3 drops into the left ear 4 (four) times daily.    Dispense:  10 mL    Refill:  0  . furosemide (LASIX) 20 MG tablet    Sig: Take 1 tablet (20 mg total) by mouth daily as needed.    Dispense:  30 tablet    Refill:  3  . polyethylene glycol (MIRALAX / GLYCOLAX) packet    Sig: Take 17 g by mouth daily.    Dispense:  14 each    Refill:  2  . ezetimibe-simvastatin (VYTORIN) 10-20 MG per tablet    Sig: Take 1 tablet by mouth daily.    Dispense:  30 tablet    Refill:  5    Return in about 6 weeks (around 01/13/2013) for Recheck medical problems.  Wendee Hata P. Modesto Charon, M.D.

## 2012-12-04 LAB — URINE CULTURE: Colony Count: 70000

## 2012-12-06 ENCOUNTER — Other Ambulatory Visit: Payer: Self-pay | Admitting: Family Medicine

## 2012-12-06 DIAGNOSIS — N390 Urinary tract infection, site not specified: Secondary | ICD-10-CM

## 2012-12-06 MED ORDER — NITROFURANTOIN MONOHYD MACRO 100 MG PO CAPS: 100.0000 mg | ORAL_CAPSULE | Freq: Two times a day (BID) | ORAL | Status: DC

## 2012-12-06 NOTE — Progress Notes (Signed)
Quick Note:  Labs abnormal. Multiple bacteria. Will cover with nitrofurantoin. Ordered in EPIC ______

## 2012-12-17 ENCOUNTER — Ambulatory Visit (INDEPENDENT_AMBULATORY_CARE_PROVIDER_SITE_OTHER): Payer: Medicare Other | Admitting: Pharmacist Clinician (PhC)/ Clinical Pharmacy Specialist

## 2012-12-17 DIAGNOSIS — I6350 Cerebral infarction due to unspecified occlusion or stenosis of unspecified cerebral artery: Secondary | ICD-10-CM

## 2012-12-17 DIAGNOSIS — I639 Cerebral infarction, unspecified: Secondary | ICD-10-CM

## 2012-12-17 LAB — POCT INR: INR: 2.6

## 2012-12-26 ENCOUNTER — Other Ambulatory Visit (INDEPENDENT_AMBULATORY_CARE_PROVIDER_SITE_OTHER): Payer: Medicare Other

## 2012-12-26 DIAGNOSIS — N390 Urinary tract infection, site not specified: Secondary | ICD-10-CM

## 2013-01-14 ENCOUNTER — Encounter: Payer: Self-pay | Admitting: Family Medicine

## 2013-01-14 ENCOUNTER — Ambulatory Visit (INDEPENDENT_AMBULATORY_CARE_PROVIDER_SITE_OTHER): Payer: Medicare Other | Admitting: Family Medicine

## 2013-01-14 VITALS — BP 122/87 | HR 108 | Temp 98.8°F

## 2013-01-14 DIAGNOSIS — Z8673 Personal history of transient ischemic attack (TIA), and cerebral infarction without residual deficits: Secondary | ICD-10-CM

## 2013-01-14 DIAGNOSIS — I10 Essential (primary) hypertension: Secondary | ICD-10-CM

## 2013-01-14 DIAGNOSIS — E785 Hyperlipidemia, unspecified: Secondary | ICD-10-CM

## 2013-01-14 DIAGNOSIS — J449 Chronic obstructive pulmonary disease, unspecified: Secondary | ICD-10-CM

## 2013-01-14 DIAGNOSIS — E1149 Type 2 diabetes mellitus with other diabetic neurological complication: Secondary | ICD-10-CM

## 2013-01-14 DIAGNOSIS — Z853 Personal history of malignant neoplasm of breast: Secondary | ICD-10-CM

## 2013-01-14 DIAGNOSIS — M109 Gout, unspecified: Secondary | ICD-10-CM

## 2013-01-14 DIAGNOSIS — I509 Heart failure, unspecified: Secondary | ICD-10-CM

## 2013-01-14 DIAGNOSIS — E11610 Type 2 diabetes mellitus with diabetic neuropathic arthropathy: Secondary | ICD-10-CM

## 2013-01-14 DIAGNOSIS — E119 Type 2 diabetes mellitus without complications: Secondary | ICD-10-CM

## 2013-01-14 LAB — POCT INR: INR: 2.3

## 2013-01-14 LAB — POCT GLYCOSYLATED HEMOGLOBIN (HGB A1C): Hemoglobin A1C: 5.5

## 2013-01-14 MED ORDER — HYDROCODONE-ACETAMINOPHEN 7.5-325 MG PO TABS: 1.0000 | ORAL_TABLET | Freq: Every day | ORAL | Status: DC

## 2013-01-14 NOTE — Progress Notes (Signed)
Patient ID: Brittany Clarke, female   DOB: 05/22/1941, 72 y.o.   MRN: [id number] SUBJECTIVE: CC:  HPI:  Patient is here for follow up of Diabetes Mellitus: Symptoms evaluated: Denies Nocturia ,Denies Urinary Frequency , denies Blurred vision ,deniesDizziness,denies.Dysuria,denies paresthesias, denies extremity pain or ulcers.Marland Kitchendenies chest pain. has had an annual eye exam. do check the feet. Does check CBGs. Average IRJ:JOAC occasionally <200 post meals. For the most part it is good.  Denies episodes of hypoglycemia. Does have an emergency hypoglycemic plan. admits toCompliance with medications. Denies Problems with medications.  Left heel pain burns at night, very bad.present hydrocodone dose does not control pain.  Past Medical History  Diagnosis Date  . CHF (congestive heart failure)   . Diabetes mellitus without complication   . COPD (chronic obstructive pulmonary disease)   . Hypertension   . Asthma   . Shortness of breath   . Stroke   . Anxiety   . 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 Pneumonia    Past Surgical History  Procedure Laterality Date  . Breast surgery    . Tubal ligation     History   Social History  . Marital Status: Legally Separated    Spouse Name: N/A    Number of Children: N/A  . Years of Education: N/A   Occupational History  . Not on file.   Social History Main Topics  . Smoking status: Never Smoker   . Smokeless tobacco: Not on file  . Alcohol Use: No  . Drug Use: No  . Sexual Activity:    Other Topics Concern  . Not on file   Social History Narrative  . No narrative on file   No family history on file. Current Outpatient Prescriptions on File Prior to Visit  Medication Sig Dispense Refill  . acetaminophen (TYLENOL) 500 MG tablet Take 500 mg by mouth every 6 (six) hours as needed. For pain      . albuterol (PROVENTIL) (5 MG/ML) 0.5% nebulizer solution Take 0.5 mLs (2.5 mg total) by nebulization every 2  (two) hours as needed for wheezing or shortness of breath.  20 mL  1  . allopurinol (ZYLOPRIM) 300 MG tablet Take 300 mg by mouth every morning.      . baclofen (LIORESAL) 10 MG tablet Take 10 mg by mouth 3 (three) times daily.      Marland Kitchen ezetimibe-simvastatin (VYTORIN) 10-20 MG per tablet Take 1 tablet by mouth daily.  30 tablet  5  . feeding supplement (GLUCERNA SHAKE) LIQD Take 237 mLs by mouth 3 (three) times daily between meals.  60 Can  1  . furosemide (LASIX) 20 MG tablet Take 1 tablet (20 mg total) by mouth daily as needed.  30 tablet  3  . gabapentin (NEURONTIN) 400 MG capsule Take 1 capsule (400 mg total) by mouth every evening.  30 capsule  2  . HYDROcodone-acetaminophen (NORCO/VICODIN) 5-325 MG per tablet Take 1 tablet by mouth at bedtime. For pain  30 tablet  1  . loratadine (CLARITIN) 10 MG tablet Take 10 mg by mouth daily.      Marland Kitchen losartan (COZAAR) 25 MG tablet Take 25 mg by mouth daily.      . metFORMIN (GLUCOPHAGE) 500 MG tablet Take 500 mg by mouth 2 (two) times daily with a meal.      . metoprolol tartrate (LOPRESSOR) 25 MG tablet Take 0.5 tablets (12.5 mg total) by mouth 2 (two) times  daily.  30 tablet  5  . montelukast (SINGULAIR) 10 MG tablet Take 10 mg by mouth at bedtime.      Marland Kitchen neomycin-polymyxin-hydrocortisone (CORTISPORIN) 3.5-10000-1 otic suspension Place 3 drops into the left ear 4 (four) times daily.  10 mL  0  . nitrofurantoin, macrocrystal-monohydrate, (MACROBID) 100 MG capsule Take 1 capsule (100 mg total) by mouth 2 (two) times daily.  20 capsule  0  . polyethylene glycol (MIRALAX / GLYCOLAX) packet Take 17 g by mouth daily.  14 each  2  . warfarin (COUMADIN) 5 MG tablet Take 5 mg by mouth daily.        No current facility-administered medications on file prior to visit.   Allergies  Allergen Reactions  . Ultram [Tramadol] Shortness Of Breath    There is no immunization history on file for this patient. Prior to Admission medications   Medication Sig Start Date  End Date Taking? Authorizing Provider  acetaminophen (TYLENOL) 500 MG tablet Take 500 mg by mouth every 6 (six) hours as needed. For pain   Yes Historical Provider, MD  albuterol (PROVENTIL) (5 MG/ML) 0.5% nebulizer solution Take 0.5 mLs (2.5 mg total) by nebulization every 2 (two) hours as needed for wheezing or shortness of breath. 06/12/12  Yes Tora Kindred York, PA-C  allopurinol (ZYLOPRIM) 300 MG tablet Take 300 mg by mouth every morning.   Yes Historical Provider, MD  baclofen (LIORESAL) 10 MG tablet Take 10 mg by mouth 3 (three) times daily.   Yes Historical Provider, MD  ezetimibe-simvastatin (VYTORIN) 10-20 MG per tablet Take 1 tablet by mouth daily. 12/02/12  Yes Ileana Ladd, MD  feeding supplement (GLUCERNA SHAKE) LIQD Take 237 mLs by mouth 3 (three) times daily between meals. 06/12/12  Yes Marianne L York, PA-C  furosemide (LASIX) 20 MG tablet Take 1 tablet (20 mg total) by mouth daily as needed. 12/02/12  Yes Ileana Ladd, MD  gabapentin (NEURONTIN) 400 MG capsule Take 1 capsule (400 mg total) by mouth every evening. 11/26/12  Yes Deatra Canter, FNP  HYDROcodone-acetaminophen (NORCO/VICODIN) 5-325 MG per tablet Take 1 tablet by mouth at bedtime. For pain 11/29/12  Yes Ileana Ladd, MD  loratadine (CLARITIN) 10 MG tablet Take 10 mg by mouth daily.   Yes Historical Provider, MD  losartan (COZAAR) 25 MG tablet Take 25 mg by mouth daily.   Yes Historical Provider, MD  metFORMIN (GLUCOPHAGE) 500 MG tablet Take 500 mg by mouth 2 (two) times daily with a meal.   Yes Historical Provider, MD  metoprolol tartrate (LOPRESSOR) 25 MG tablet Take 0.5 tablets (12.5 mg total) by mouth 2 (two) times daily. 08/20/12  Yes Ileana Ladd, MD  montelukast (SINGULAIR) 10 MG tablet Take 10 mg by mouth at bedtime.   Yes Historical Provider, MD  neomycin-polymyxin-hydrocortisone (CORTISPORIN) 3.5-10000-1 otic suspension Place 3 drops into the left ear 4 (four) times daily. 12/02/12  Yes Ileana Ladd, MD   nitrofurantoin, macrocrystal-monohydrate, (MACROBID) 100 MG capsule Take 1 capsule (100 mg total) by mouth 2 (two) times daily. 12/06/12  Yes Ileana Ladd, MD  polyethylene glycol (MIRALAX / GLYCOLAX) packet Take 17 g by mouth daily. 12/02/12  Yes Ileana Ladd, MD  warfarin (COUMADIN) 5 MG tablet Take 5 mg by mouth daily.    Yes Historical Provider, MD     ROS: As above in the HPI. All other systems are stable or negative.  OBJECTIVE: APPEARANCE:  Patient in no acute distress.The patient appeared well  nourished and normally developed. Acyanotic. Waist: VITAL SIGNS:BP 122/87  Pulse 108  Temp(Src) 98.8 F (37.1 C) (Oral) AAF, morbidly obese with right hemiplegia.  SKIN: warm and  Dry without overt rashes, tattoos and scars  HEAD and Neck: without JVD, Head and scalp: normal Eyes:No scleral icterus. Fundi normal, eye movements normal. Ears: Auricle normal, canal normal, Tympanic membranes normal, insufflation normal. Nose: normal Throat: normal Neck & thyroid: normal  CHEST & LUNGS: Chest wall: normal Lungs: Clear  CVS: Reveals the PMI to be normally located. Regular rhythm, First and Second Heart sounds are normal,  absence of murmurs, rubs or gallops. Peripheral vasculature: Radial pulses: normal Dorsal pedis pulses: normal Posterior pulses: normal  ABDOMEN:  Appearance: normal Benign, no organomegaly, no masses, no Abdominal Aortic enlargement. No Guarding , no rebound. No Bruits. Bowel sounds: normal  RECTAL: N/A GU: N/A  EXTREMETIES: 1+ edema  MUSCULOSKELETAL:  Spine: normal. Left heel soft and boggy.and exquisitely tender  NEUROLOGIC: oriented to time,place and person;  Right hemiplegia  Results for orders placed in visit on 01/14/13  POCT GLYCOSYLATED HEMOGLOBIN (HGB A1C)      Result Value Range   Hemoglobin A1C 5.5%    POCT INR      Result Value Range   INR 2.3      ASSESSMENT: CHF (congestive heart failure)  COPD (chronic obstructive  pulmonary disease)  DM (diabetes mellitus) - Plan: POCT glycosylated hemoglobin (Hb A1C), CMP14+EGFR  Gout  History of breast cancer  History of CVA (cerebrovascular accident) - Plan: POCT INR  HLD (hyperlipidemia) - Plan: CMP14+EGFR, NMR, lipoprofile  HTN (hypertension) - Plan: CMP14+EGFR  Charcot foot due to diabetes mellitus - Plan: HYDROcodone-acetaminophen (NORCO) 7.5-325 MG per tablet   PLAN: Orders Placed This Encounter  Procedures  . CMP14+EGFR  . NMR, lipoprofile  . POCT glycosylated hemoglobin (Hb A1C)  . POCT INR    Meds ordered this encounter  Medications  . HYDROcodone-acetaminophen (NORCO) 7.5-325 MG per tablet    Sig: Take 1 tablet by mouth at bedtime.    Dispense:  30 tablet    Refill:  0   Medications Discontinued During This Encounter  Medication Reason  . HYDROcodone-acetaminophen (NORCO/VICODIN) 5-325 MG per tablet Dose change    Return in about 4 weeks (around 02/11/2013) for coumadin clinic, 3 months with me.Thelma Barge P. Modesto Charon, M.D.

## 2013-01-16 ENCOUNTER — Other Ambulatory Visit: Payer: Self-pay | Admitting: Family Medicine

## 2013-01-16 ENCOUNTER — Telehealth: Payer: Self-pay | Admitting: Family Medicine

## 2013-01-16 DIAGNOSIS — I639 Cerebral infarction, unspecified: Secondary | ICD-10-CM

## 2013-01-16 LAB — CMP14+EGFR
ALT: 11 IU/L (ref 0–32)
AST: 14 IU/L (ref 0–40)
Albumin/Globulin Ratio: 1.6 (ref 1.1–2.5)
Albumin: 4.3 g/dL (ref 3.5–4.8)
Alkaline Phosphatase: 97 IU/L (ref 39–117)
BUN/Creatinine Ratio: 33 — ABNORMAL HIGH (ref 11–26)
BUN: 17 mg/dL (ref 8–27)
CO2: 26 mmol/L (ref 18–29)
Calcium: 10 mg/dL (ref 8.6–10.2)
Chloride: 99 mmol/L (ref 97–108)
Creatinine, Ser: 0.51 mg/dL — ABNORMAL LOW (ref 0.57–1.00)
GFR calc Af Amer: 112 mL/min/{1.73_m2} (ref >59)
GFR calc non Af Amer: 97 mL/min/{1.73_m2} (ref >59)
Globulin, Total: 2.7 g/dL (ref 1.5–4.5)
Glucose: 112 mg/dL — ABNORMAL HIGH (ref 65–99)
Potassium: 4.7 mmol/L (ref 3.5–5.2)
Sodium: 140 mmol/L (ref 134–144)
Total Bilirubin: 0.2 mg/dL (ref 0.0–1.2)
Total Protein: 7 g/dL (ref 6.0–8.5)

## 2013-01-16 LAB — NMR, LIPOPROFILE
Cholesterol: 160 mg/dL (ref <200)
HDL Cholesterol by NMR: 54 mg/dL (ref >=40)
HDL Particle Number: 40.4 umol/L (ref >=30.5)
LDL Particle Number: 1521 nmol/L — ABNORMAL HIGH (ref <1000)
LDL Size: 20.7 nm (ref >20.5)
LDLC SERPL CALC-MCNC: 85 mg/dL (ref <100)
LP-IR Score: 27 (ref <=45)
Small LDL Particle Number: 651 nmol/L — ABNORMAL HIGH (ref <=527)
Triglycerides by NMR: 105 mg/dL (ref <150)

## 2013-01-16 NOTE — Telephone Encounter (Signed)
Daughter aware referral for PT has been initiated and informed to call back by next Wednesday if not heard back from [place].

## 2013-01-16 NOTE — Telephone Encounter (Signed)
Referral done in [facility]. 

## 2013-01-22 ENCOUNTER — Ambulatory Visit (INDEPENDENT_AMBULATORY_CARE_PROVIDER_SITE_OTHER): Payer: Medicare Other | Admitting: Pharmacist

## 2013-01-22 ENCOUNTER — Telehealth: Payer: Self-pay

## 2013-01-22 NOTE — Progress Notes (Signed)
This is for tracking purposes only.  Patient was seen 01/14/13 by Dr Modesto Charon.

## 2013-01-22 NOTE — Telephone Encounter (Signed)
Need a referral to [facility]

## 2013-01-24 ENCOUNTER — Telehealth: Payer: Self-pay | Admitting: Family Medicine

## 2013-01-24 ENCOUNTER — Ambulatory Visit (INDEPENDENT_AMBULATORY_CARE_PROVIDER_SITE_OTHER): Payer: Medicare Other | Admitting: Family Medicine

## 2013-01-24 ENCOUNTER — Encounter: Payer: Self-pay | Admitting: Family Medicine

## 2013-01-24 VITALS — BP 110/80 | HR 102 | Temp 98.7°F

## 2013-01-24 DIAGNOSIS — Z7901 Long term (current) use of anticoagulants: Secondary | ICD-10-CM

## 2013-01-24 DIAGNOSIS — M109 Gout, unspecified: Secondary | ICD-10-CM

## 2013-01-24 DIAGNOSIS — E119 Type 2 diabetes mellitus without complications: Secondary | ICD-10-CM

## 2013-01-24 DIAGNOSIS — K5909 Other constipation: Secondary | ICD-10-CM

## 2013-01-24 DIAGNOSIS — J4489 Other specified chronic obstructive pulmonary disease: Secondary | ICD-10-CM

## 2013-01-24 DIAGNOSIS — K5900 Constipation, unspecified: Secondary | ICD-10-CM

## 2013-01-24 DIAGNOSIS — D649 Anemia, unspecified: Secondary | ICD-10-CM

## 2013-01-24 DIAGNOSIS — K625 Hemorrhage of anus and rectum: Secondary | ICD-10-CM | POA: Insufficient documentation

## 2013-01-24 DIAGNOSIS — I509 Heart failure, unspecified: Secondary | ICD-10-CM

## 2013-01-24 DIAGNOSIS — J449 Chronic obstructive pulmonary disease, unspecified: Secondary | ICD-10-CM

## 2013-01-24 LAB — POCT CBC
Granulocyte percent: 60.2 %G (ref 37–80)
HCT, POC: 39.3 % (ref 37.7–47.9)
Hemoglobin: 12.2 g/dL (ref 12.2–16.2)
Lymph, poc: 3 (ref 0.6–3.4)
MCH, POC: 27.9 pg (ref 27–31.2)
MCHC: 31.1 g/dL — AB (ref 31.8–35.4)
MCV: 89.6 fL (ref 80–97)
MPV: 6.8 fL (ref 0–99.8)
POC Granulocyte: 5.2 (ref 2–6.9)
POC LYMPH PERCENT: 34.6 %L (ref 10–50)
Platelet Count, POC: 330 10*3/uL (ref 142–424)
RBC: 4.4 M/uL (ref 4.04–5.48)
RDW, POC: 14.9 %
WBC: 8.7 10*3/uL (ref 4.6–10.2)

## 2013-01-24 LAB — POCT INR: INR: 2.4

## 2013-01-24 MED ORDER — HYDROCORTISONE ACETATE 25 MG RE SUPP: 25.0000 mg | Freq: Two times a day (BID) | RECTAL | Status: DC

## 2013-01-24 NOTE — Progress Notes (Signed)
Patient ID: Brittany Clarke, female   DOB: 1940/11/30, 72 y.o.   MRN: [id number] SUBJECTIVE: CC: Chief Complaint  Patient presents with  . Follow-up    c/o rectal bleeding saw tammy for protime 2.3       HPI: Another episode of rectal bleeding. Seen a couple day ago for protime by the clinical pharmacist.  Has had recurrent rectal BRBPR. Some soreness in the rectum. No fever. It has persisted daily for the past 6 days. Came this Friday afternoon to get checked.  Was seen and evaluated in the ED in July for Rectal bleeding.  Has not been constipated.stools have been soft.   Past Medical History  Diagnosis Date  . CHF (congestive heart failure)   . Diabetes mellitus without complication   . COPD (chronic obstructive pulmonary disease)   . Hypertension   . Asthma   . Shortness of breath   . Stroke   . Anxiety   . 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 Pneumonia    Past Surgical History  Procedure Laterality Date  . Breast surgery    . Tubal ligation     History   Social History  . Marital Status: Legally Separated    Spouse Name: N/A    Number of Children: N/A  . Years of Education: N/A   Occupational History  . Not on file.   Social History Main Topics  . Smoking status: Never Smoker   . Smokeless tobacco: Not on file  . Alcohol Use: No  . Drug Use: No  . Sexual Activity:    Other Topics Concern  . Not on file   Social History Narrative  . No narrative on file   No family history on file. Current Outpatient Prescriptions on File Prior to Visit  Medication Sig Dispense Refill  . acetaminophen (TYLENOL) 500 MG tablet Take 500 mg by mouth every 6 (six) hours as needed. For pain      . albuterol (PROVENTIL) (5 MG/ML) 0.5% nebulizer solution Take 0.5 mLs (2.5 mg total) by nebulization every 2 (two) hours as needed for wheezing or shortness of breath.  20 mL  1  . allopurinol (ZYLOPRIM) 300 MG tablet Take 300 mg by mouth every morning.       . baclofen (LIORESAL) 10 MG tablet Take 10 mg by mouth 3 (three) times daily.      Marland Kitchen ezetimibe-simvastatin (VYTORIN) 10-20 MG per tablet Take 1 tablet by mouth daily.  30 tablet  5  . feeding supplement (GLUCERNA SHAKE) LIQD Take 237 mLs by mouth 3 (three) times daily between meals.  60 Can  1  . furosemide (LASIX) 20 MG tablet Take 1 tablet (20 mg total) by mouth daily as needed.  30 tablet  3  . gabapentin (NEURONTIN) 400 MG capsule Take 1 capsule (400 mg total) by mouth every evening.  30 capsule  2  . HYDROcodone-acetaminophen (NORCO) 7.5-325 MG per tablet Take 1 tablet by mouth at bedtime.  30 tablet  0  . loratadine (CLARITIN) 10 MG tablet Take 10 mg by mouth daily.      Marland Kitchen losartan (COZAAR) 25 MG tablet Take 25 mg by mouth daily.      . metFORMIN (GLUCOPHAGE) 500 MG tablet Take 500 mg by mouth 2 (two) times daily with a meal.      . metoprolol tartrate (LOPRESSOR) 25 MG tablet Take 0.5 tablets (12.5 mg total) by mouth 2 (two) times  daily.  30 tablet  5  . montelukast (SINGULAIR) 10 MG tablet Take 10 mg by mouth at bedtime.      Marland Kitchen neomycin-polymyxin-hydrocortisone (CORTISPORIN) 3.5-10000-1 otic suspension Place 3 drops into the left ear 4 (four) times daily.  10 mL  0  . polyethylene glycol (MIRALAX / GLYCOLAX) packet Take 17 g by mouth daily.  14 each  2  . warfarin (COUMADIN) 5 MG tablet Take 5 mg by mouth daily.        No current facility-administered medications on file prior to visit.   Allergies  Allergen Reactions  . Ultram [Tramadol] Shortness Of Breath    There is no immunization history on file for this patient. Prior to Admission medications   Medication Sig Start Date End Date Taking? Authorizing Provider  acetaminophen (TYLENOL) 500 MG tablet Take 500 mg by mouth every 6 (six) hours as needed. For pain   Yes Historical Provider, MD  albuterol (PROVENTIL) (5 MG/ML) 0.5% nebulizer solution Take 0.5 mLs (2.5 mg total) by nebulization every 2 (two) hours as needed for  wheezing or shortness of breath. 06/12/12  Yes Tora Kindred York, PA-C  allopurinol (ZYLOPRIM) 300 MG tablet Take 300 mg by mouth every morning.   Yes Historical Provider, MD  baclofen (LIORESAL) 10 MG tablet Take 10 mg by mouth 3 (three) times daily.   Yes Historical Provider, MD  ezetimibe-simvastatin (VYTORIN) 10-20 MG per tablet Take 1 tablet by mouth daily. 12/02/12  Yes Ileana Ladd, MD  feeding supplement (GLUCERNA SHAKE) LIQD Take 237 mLs by mouth 3 (three) times daily between meals. 06/12/12  Yes Marianne L York, PA-C  furosemide (LASIX) 20 MG tablet Take 1 tablet (20 mg total) by mouth daily as needed. 12/02/12  Yes Ileana Ladd, MD  gabapentin (NEURONTIN) 400 MG capsule Take 1 capsule (400 mg total) by mouth every evening. 11/26/12  Yes Deatra Canter, FNP  HYDROcodone-acetaminophen (NORCO) 7.5-325 MG per tablet Take 1 tablet by mouth at bedtime. 01/14/13  Yes Ileana Ladd, MD  loratadine (CLARITIN) 10 MG tablet Take 10 mg by mouth daily.   Yes Historical Provider, MD  losartan (COZAAR) 25 MG tablet Take 25 mg by mouth daily.   Yes Historical Provider, MD  metFORMIN (GLUCOPHAGE) 500 MG tablet Take 500 mg by mouth 2 (two) times daily with a meal.   Yes Historical Provider, MD  metoprolol tartrate (LOPRESSOR) 25 MG tablet Take 0.5 tablets (12.5 mg total) by mouth 2 (two) times daily. 08/20/12  Yes Ileana Ladd, MD  montelukast (SINGULAIR) 10 MG tablet Take 10 mg by mouth at bedtime.   Yes Historical Provider, MD  neomycin-polymyxin-hydrocortisone (CORTISPORIN) 3.5-10000-1 otic suspension Place 3 drops into the left ear 4 (four) times daily. 12/02/12  Yes Ileana Ladd, MD  polyethylene glycol (MIRALAX / GLYCOLAX) packet Take 17 g by mouth daily. 12/02/12  Yes Ileana Ladd, MD  warfarin (COUMADIN) 5 MG tablet Take 5 mg by mouth daily.    Yes Historical Provider, MD      ROS: As above in the HPI. All other systems are stable or negative.  OBJECTIVE: APPEARANCE:  Patient in no acute  distress.The patient appeared well nourished and normally developed. Acyanotic. Waist: VITAL SIGNS:BP 110/80  Pulse 102  Temp(Src) 98.7 F (37.1 C) Morbidly obese AAF In a wheelchair  SKIN: warm and  Dry  HEAD and Neck: without JVD, Head and scalp: normal Eyes:No scleral icterus. Fundi normal, eye movements normal. Ears: Auricle normal, canal  normal, Tympanic membranes normal, insufflation normal. Nose: normal Throat: normal Neck & thyroid: normal  CHEST & LUNGS: Chest wall: normal Lungs: Clear  CVS: Reveals the PMI to be normally located. Regular rhythm, First and Second Heart sounds are normal,  absence of murmurs, rubs or gallops.  ABDOMEN:  Appearance: obese Benign, no organomegaly, no masses, no Abdominal Aortic enlargement. No Guarding , no rebound. No Bruits. Bowel sounds: normal  RECTAL: BRBPR mixed with small amount of stool. Hemorrhoid not seen. Anoscope used and source of bleeding not found. GU: N/A  NEUROLOGIC: oriented to place and person; right hemiplegia  ASSESSMENT: Rectal bleeding - Plan: POCT CBC, POCT INR  Long term (current) use of anticoagulants  DM (diabetes mellitus)  CHF (congestive heart failure)  Chronic constipation  Anemia  COPD (chronic obstructive pulmonary disease)  Constipation  Gout   PLAN: Orders Placed This Encounter  Procedures  . POCT CBC  . POCT INR   Results for orders placed in visit on 01/24/13  POCT CBC      Result Value Range   WBC 8.7  4.6 - 10.2 K/uL   Lymph, poc 3.0  0.6 - 3.4   POC LYMPH PERCENT 34.6  10 - 50 %L   POC Granulocyte 5.2  2 - 6.9   Granulocyte percent 60.2  37 - 80 %G   RBC 4.4  4.04 - 5.48 M/uL   Hemoglobin 12.2  12.2 - 16.2 g/dL   HCT, POC 96.0  45.4 - 47.9 %   MCV 89.6  80 - 97 fL   MCH, POC 27.9  27 - 31.2 pg   MCHC 31.1 (*) 31.8 - 35.4 g/dL   RDW, POC 09.8     Platelet Count, POC 330.0  142 - 424 K/uL   MPV 6.8  0 - 99.8 fL  POCT INR      Result Value Range   INR 2.4      Meds ordered this encounter  Medications  . hydrocortisone (ANUSOL-HC) 25 MG suppository    Sig: Place 1 suppository (25 mg total) rectally 2 (two) times daily.    Dispense:  12 suppository    Refill:  0    Return in about 3 days (around 01/27/2013) for labwork. Recheck CBC. If acute worsening or incraese bleeding then patient should be taken to the ED.  Malie Kashani P. Modesto Charon, M.D.

## 2013-01-24 NOTE — Telephone Encounter (Signed)
Appt given for today at 330 with Brittany Clarke

## 2013-01-27 ENCOUNTER — Other Ambulatory Visit (INDEPENDENT_AMBULATORY_CARE_PROVIDER_SITE_OTHER): Payer: Medicare Other

## 2013-01-27 DIAGNOSIS — I6350 Cerebral infarction due to unspecified occlusion or stenosis of unspecified cerebral artery: Secondary | ICD-10-CM

## 2013-01-27 DIAGNOSIS — I639 Cerebral infarction, unspecified: Secondary | ICD-10-CM

## 2013-01-27 LAB — POCT HEMOGLOBIN: Hemoglobin: 11.7 g/dL — AB (ref 12.2–16.2)

## 2013-01-27 LAB — POCT INR: INR: 2.3

## 2013-01-29 ENCOUNTER — Other Ambulatory Visit: Payer: Self-pay | Admitting: Family Medicine

## 2013-01-29 DIAGNOSIS — I639 Cerebral infarction, unspecified: Secondary | ICD-10-CM

## 2013-01-29 NOTE — Telephone Encounter (Signed)
Referral done to PT needs to be at [facility] Thanks. Gerber Penza P. Modesto Charon, M.D.

## 2013-02-04 ENCOUNTER — Ambulatory Visit: Payer: PRIVATE HEALTH INSURANCE | Admitting: Physical Therapy

## 2013-02-07 ENCOUNTER — Telehealth: Payer: Self-pay | Admitting: Family Medicine

## 2013-02-10 ENCOUNTER — Encounter: Payer: Self-pay | Admitting: Internal Medicine

## 2013-02-11 ENCOUNTER — Encounter: Payer: Self-pay | Admitting: Gastroenterology

## 2013-02-11 ENCOUNTER — Telehealth: Payer: Self-pay

## 2013-02-11 ENCOUNTER — Ambulatory Visit (INDEPENDENT_AMBULATORY_CARE_PROVIDER_SITE_OTHER): Payer: PRIVATE HEALTH INSURANCE | Admitting: Gastroenterology

## 2013-02-11 VITALS — BP 125/83 | HR 86 | Temp 97.8°F | Ht 65.0 in

## 2013-02-11 DIAGNOSIS — K5900 Constipation, unspecified: Secondary | ICD-10-CM | POA: Diagnosis not present

## 2013-02-11 DIAGNOSIS — R1314 Dysphagia, pharyngoesophageal phase: Secondary | ICD-10-CM

## 2013-02-11 DIAGNOSIS — K625 Hemorrhage of anus and rectum: Secondary | ICD-10-CM

## 2013-02-11 DIAGNOSIS — R1310 Dysphagia, unspecified: Secondary | ICD-10-CM

## 2013-02-11 DIAGNOSIS — D649 Anemia, unspecified: Secondary | ICD-10-CM

## 2013-02-11 DIAGNOSIS — K5909 Other constipation: Secondary | ICD-10-CM

## 2013-02-11 NOTE — Patient Instructions (Addendum)
1. We will contact Dr. Modesto Charon regarding management of your Coumadin prior to colonoscopy and upper endoscopy.

## 2013-02-11 NOTE — Progress Notes (Signed)
Primary Care Physician:  Redmond Baseman, MD  Primary Gastroenterologist:  Roetta Sessions, MD   Chief Complaint  Patient presents with  . Rectal Bleeding    HPI:  Brittany Clarke is a 72 y.o. female here for further evaluation of intermittent rectal bleeding. She is having bright red blood per rectum almost with every bowel movement. Previously had had some constipation. Too much diarrhea on Linzess. Now on MiraLax daily. Stools are soft. Some rectal pain. Was evaluated in emergency department in July for rectal bleeding. Recently saw her PCP who performed an anoscopy in the office. Prior red blood per rectum mixed with a small amount of stool noted. No hemorrhoid seen. Source of bleeding could not be identified.Bowel movement every day to QOD. Occasional Mid abdominal pain unrelated to meals. Not affected by BMs. Uses walker and quad cane around her house. 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 Had been in physical therapy. No heartburn. Meats/pills dysphagia. Coumadin since CVA.  Current Outpatient Prescriptions  Medication Sig Dispense Refill  . acetaminophen (TYLENOL) 500 MG tablet Take 500 mg by mouth every 6 (six) hours as needed. For pain      . albuterol (PROVENTIL) (5 MG/ML) 0.5% nebulizer solution Take 0.5 mLs (2.5 mg total) by nebulization every 2 (two) hours as needed for wheezing or shortness of breath.  20 mL  1  . allopurinol (ZYLOPRIM) 300 MG tablet Take 300 mg by mouth every morning.      . baclofen (LIORESAL) 10 MG tablet Take 10 mg by mouth 3 (three) times daily.      Marland Clarke ezetimibe-simvastatin (VYTORIN) 10-20 MG per tablet Take 1 tablet by mouth daily.  30 tablet  5  . feeding supplement (GLUCERNA SHAKE) LIQD Take 237 mLs by mouth 3 (three) times daily between meals.  60 Can  1  . furosemide (LASIX) 20 MG tablet Take 1 tablet (20 mg total) by mouth daily as needed.  30 tablet  3  . gabapentin (NEURONTIN) 400 MG capsule Take 1 capsule (400 mg total) by mouth every evening.   30 capsule  2  . HYDROcodone-acetaminophen (NORCO) 7.5-325 MG per tablet Take 1 tablet by mouth at bedtime.  30 tablet  0  . loratadine (CLARITIN) 10 MG tablet Take 10 mg by mouth daily.      Marland Clarke losartan (COZAAR) 25 MG tablet Take 25 mg by mouth daily.      . metFORMIN (GLUCOPHAGE) 500 MG tablet Take 500 mg by mouth 2 (two) times daily with a meal.      . metoprolol tartrate (LOPRESSOR) 25 MG tablet Take 0.5 tablets (12.5 mg total) by mouth 2 (two) times daily.  30 tablet  5  . montelukast (SINGULAIR) 10 MG tablet Take 10 mg by mouth at bedtime.      Marland Clarke neomycin-polymyxin-hydrocortisone (CORTISPORIN) 3.5-10000-1 otic suspension Place 3 drops into the left ear 4 (four) times daily.  10 mL  0  . polyethylene glycol (MIRALAX / GLYCOLAX) packet Take 17 g by mouth daily.  14 each  2  . warfarin (COUMADIN) 5 MG tablet Take 5 mg by mouth daily.        No current facility-administered medications for this visit.    Allergies as of 02/11/2013 - Review Complete 02/11/2013  Allergen Reaction Noted  . Ultram [tramadol] Shortness Of Breath 06/10/2012    Past Medical History  Diagnosis Date  . CHF (congestive heart failure)   . Diabetes mellitus without complication   . COPD (chronic obstructive pulmonary  disease)   . Hypertension   . Asthma   . Shortness of breath   . Stroke     2013  . Anxiety   . 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 Pneumonia   . Gout     Past Surgical History  Procedure Laterality Date  . Breast surgery    . Tubal ligation    . Colonoscopy  04/04/2002    ZOX:[id number] hemorrhoids, otherwise, normal rectum/Scattered left sided diverticula/Somewhat adenomatous appearing ileocecal valve which is probably normal but this area was biopsied. The remainder of the colonic mucosa appeared   normal  . Trigger finger surgery      twice    Family History  Problem Relation Age of Onset  . Colon cancer Neg Hx   . Liver disease Neg Hx     History    Social History  . Marital Status: Legally Separated    Spouse Name: N/A    Number of Children: 5  . Years of Education: N/A   Occupational History  . Not on file.   Social History Main Topics  . Smoking status: Never Smoker   . Smokeless tobacco: Not on file  . Alcohol Use: No  . Drug Use: No  . Sexual Activity:    Other Topics Concern  . Not on file   Social History Narrative  . No narrative on file      ROS:  General: Negative for anorexia, weight loss, fever, chills, fatigue, weakness. Eyes: Negative for vision changes.  ENT: Negative for hoarseness, difficulty swallowing , nasal congestion. CV: Negative for chest pain, angina, palpitations, dyspnea on exertion, peripheral edema.  Respiratory: Negative for dyspnea at rest, dyspnea on exertion, cough, sputum, wheezing.  GI: See history of present illness. GU:  Negative for dysuria, hematuria, urinary incontinence, urinary frequency, nocturnal urination.  MS: Negative for joint pain. Chronic low back pain.  Derm: Negative for rash or itching.  Neuro: Negative for seizure, frequent headaches, memory loss, confusion. Right hemiparesis Psych: Negative for anxiety, depression, suicidal ideation, hallucinations.  Endo: Negative for unusual weight change.  Heme: Negative for bruising or bleeding. Allergy: Negative for rash or hives.    Physical Examination:  BP 125/83  Pulse 86  Temp(Src) 97.8 F (36.6 C) (Oral)  Ht 5\' 5"  (1.651 m)   General: Well-nourished, well-developed in no acute distress. Accompanied by aide. Head: Normocephalic, atraumatic.   Eyes: Conjunctiva pink, no icterus. Mouth: Oropharyngeal mucosa moist and pink , no lesions erythema or exudate. Neck: Supple without thyromegaly, masses, or lymphadenopathy.  Lungs: Clear to auscultation bilaterally.  Heart: Regular rate and rhythm, no murmurs rubs or gallops.  Abdomen: Bowel sounds are normal, nontender, nondistended, no hepatosplenomegaly or  masses, no abdominal bruits or    hernia , no rebound or guarding.   Rectal: defered Extremities: No lower extremity edema. No clubbing or deformities.  Neuro: Alert and oriented x 4 , grossly normal neurologically.  Skin: Warm and dry, no rash or jaundice.   Psych: Alert and cooperative, normal mood and affect.  Labs: Lab Results  Component Value Date   WBC 8.7 01/24/2013   HGB 11.7* 01/27/2013   HCT 39.3 01/24/2013   MCV 89.6 01/24/2013   PLT 253 11/25/2012   Lab Results  Component Value Date   INR 2.3 01/27/2013   INR 2.4 01/24/2013   INR 2.3 01/14/2013     Imaging Studies: No results found.

## 2013-02-11 NOTE — Telephone Encounter (Signed)
Dr. Modesto Charon, this patient was seen by our office today for rectal bleeding. We would like to schedule her for a colonoscopy and EGD, however she is on coumadin, and we need to know if it is ok to hold the coumadin for 5 days prior to her procedure or will she need a lovenox bridge.    Thank you,  Tana Coast PA-C jl

## 2013-02-12 ENCOUNTER — Encounter: Payer: Self-pay | Admitting: Gastroenterology

## 2013-02-12 ENCOUNTER — Other Ambulatory Visit: Payer: Self-pay | Admitting: Internal Medicine

## 2013-02-12 MED ORDER — PEG 3350-KCL-NA BICARB-NACL 420 G PO SOLR: 4000.0000 mL | ORAL | Status: DC

## 2013-02-12 NOTE — Telephone Encounter (Signed)
Patient is scheduled for Thursday Oct 2nd at 12:15 I have sent prep to pharmacy and instructions in the mail. I am forwarding this info to [employer] for Lovenox instructions

## 2013-02-12 NOTE — Telephone Encounter (Signed)
Mrs. Knee has appt tomorrow for protime.   At that time I will go over bridging plan.  She will start holding warfarin 9/28 and start lovenox 110mg  bid on 02/17/13.

## 2013-02-12 NOTE — Assessment & Plan Note (Signed)
Complains of dysphagia to meats and pills. Denies typical heartburn symptoms. Some mid abdominal pain unrelated to meals or bowel movements. Discussed the possibility of upper endoscopy plus or minus dilation at time of colonoscopy and patient is interested.  I have discussed the risks, alternatives, benefits with regards to but not limited to the risk of reaction to medication, bleeding, infection, perforation and the patient is agreeable to proceed. Written consent to be obtained.

## 2013-02-12 NOTE — Telephone Encounter (Signed)
Please schedule TCS/EGD with Dr. Jena Gauss.  Day of bowel prep: Metformin 250mg  BID.  She will need to have Lovenox bridge to be orchestrated by Henrene Pastor, PHARMD with PCP.  Please schedule patient and notify Henrene Pastor of the date so she can make arrangements for Lovenox bridging with the patient.   Patient will need to hold coumadin four days before the procedure.

## 2013-02-12 NOTE — Progress Notes (Signed)
CC'd to PCP 

## 2013-02-12 NOTE — Telephone Encounter (Signed)
This patient can hold warfarin for 5 days but she will need bridging with Lovenox due to her high risk of stoke (CHADS2VASc score was 7). Please notify our office once colonoscopy schedule so we might make appt and discuss with patient the bridging regimen.

## 2013-02-12 NOTE — Assessment & Plan Note (Signed)
72 year old lady who presents with several month history of bright red blood per rectum in the setting of chronic constipation and chronic Coumadin therapy (stroke in September 2013). Recent endoscopy by PCP revealed blood in the rectum but no obvious source. She has mild chronic anemia, hemoglobin has been stable. Last colonoscopy 10 years ago. At that time noted to have internal hemorrhoids. Agree with repeat colonoscopy for diagnostic purposes at this time. We will touch base with her PCP who manages her Coumadin therapy to determine whether or not she should undergo a Lovenox bridging. Plan for colonoscopy in the near future.  I have discussed the risks, alternatives, benefits with regards to but not limited to the risk of reaction to medication, bleeding, infection, perforation and the patient is agreeable to proceed. Written consent to be obtained.  She will continue MiraLax 17 g daily for constipation.

## 2013-02-13 ENCOUNTER — Other Ambulatory Visit: Payer: Self-pay | Admitting: Internal Medicine

## 2013-02-13 ENCOUNTER — Ambulatory Visit (INDEPENDENT_AMBULATORY_CARE_PROVIDER_SITE_OTHER): Payer: Medicare Other | Admitting: Pharmacist

## 2013-02-13 DIAGNOSIS — K625 Hemorrhage of anus and rectum: Secondary | ICD-10-CM

## 2013-02-13 DIAGNOSIS — R1310 Dysphagia, unspecified: Secondary | ICD-10-CM

## 2013-02-13 DIAGNOSIS — D649 Anemia, unspecified: Secondary | ICD-10-CM

## 2013-02-13 DIAGNOSIS — I6350 Cerebral infarction due to unspecified occlusion or stenosis of unspecified cerebral artery: Secondary | ICD-10-CM

## 2013-02-13 DIAGNOSIS — K5900 Constipation, unspecified: Secondary | ICD-10-CM

## 2013-02-13 DIAGNOSIS — I639 Cerebral infarction, unspecified: Secondary | ICD-10-CM

## 2013-02-13 LAB — POCT INR: INR: 3.1

## 2013-02-13 MED ORDER — ENOXAPARIN SODIUM 120 MG/0.8ML ~~LOC~~ SOLN: SUBCUTANEOUS | Status: DC

## 2013-02-13 NOTE — Progress Notes (Signed)
Order sent for home health services for administration of Lovenox and INR check prior to and after colonscopy.  Patient is to have colonoscopy 02/20/13. She is to begin holding warfarin 02/16/13 and start Levenox 110mg  SQ q 12 h on 02/17/13.   She will also bridge following colonoscopy with warfarin and lovenox once hemodynamically stable (usually 24 to 48 hours after procedure) Recommend after colonoscopy take warfarin 7.5mg  on Friday, October 3rd, Sat, October 4th and Sunday, October 5th.   Also restart lovenox 110mg  q 12 hours in the evening of Friday, October 3rd and continue until INR rechecked on Monday, October 6th.

## 2013-02-13 NOTE — Patient Instructions (Signed)
Anticoagulation Dose Instructions as of 02/13/2013     Brittany Clarke Tue Wed Thu Fri Sat   New Dose 5 mg 5 mg 5 mg 5 mg 5 mg 5 mg 5 mg    Description       Take 1/2 tablet today, then restart 1 tablet daily.       INR was 3.1 today

## 2013-02-14 ENCOUNTER — Telehealth: Payer: Self-pay | Admitting: Family Medicine

## 2013-02-14 NOTE — Telephone Encounter (Signed)
Left message for pt to return call.

## 2013-02-14 NOTE — Telephone Encounter (Signed)
Spoke with [place] who is Ms Vanbergen 's aide and she verbalized she is aware of the plan for her bridging but she needs to be instructed on how to do the lovenox injections. She was thinking they may could do home health but since injections every 12 hrs she was unsure who would do the injection in the evening.  Advised [place] to call on Monday and speak with Tammy regarding the injections .

## 2013-02-14 NOTE — Telephone Encounter (Signed)
Called and left message to return call. 

## 2013-02-17 ENCOUNTER — Encounter (HOSPITAL_COMMUNITY): Payer: Self-pay | Admitting: Pharmacy Technician

## 2013-02-17 NOTE — Telephone Encounter (Signed)
Spoke with representative at [facility].  They are waiting on [employer] to approve them to provide home health services for patient.  Since they cannot come out this am, I have called Mrs. Brittany Clarke and instructed her aid to bring her to the office this morning and we will administer the Lovenox this morning and teach the aid to administer if possible.

## 2013-02-17 NOTE — Telephone Encounter (Signed)
Spoke with our referral department and they said home health was set up to come out to give lovenox injections.  However I called Mrs. Brittany Clarke and she has not been contacted.  I called [name] with [facility] at [phone] and LM to find out about referral.

## 2013-02-17 NOTE — Telephone Encounter (Signed)
Spoke with daughter who is a Engineer, civil (consulting).  She understand lovenox instructions and can administer Lovenox until [facility] is approved and can come out.

## 2013-02-18 ENCOUNTER — Encounter: Payer: Self-pay | Admitting: Internal Medicine

## 2013-02-18 ENCOUNTER — Telehealth: Payer: Self-pay

## 2013-02-18 ENCOUNTER — Other Ambulatory Visit: Payer: Self-pay

## 2013-02-18 MED ORDER — GABAPENTIN 400 MG PO CAPS: 400.0000 mg | ORAL_CAPSULE | Freq: Every evening | ORAL | Status: DC

## 2013-02-18 NOTE — Telephone Encounter (Signed)
Call made to Dr Kendell Bane. He will talk with nurse and decide appropriate action, ie admit most likely by hospitalist.  Maryla Morrow. Modesto Charon, M.D.

## 2013-02-18 NOTE — Progress Notes (Signed)
Patient ID: Brittany Clarke, female   DOB: November 29, 1940, 72 y.o.   MRN: [id number]   Dr. Modesto Charon called about this patient after being called by the home health nurse.  Rectal bleeding has picked up. Has not been recently assessed by the home health nurse. He has requested I directly admit for 24-hour observation as requested by the home health nurse. We do not have a current H&H, etc. Coumadin stopped yesterday; bridge with Lovenox for colonoscopy on October 2. I called Dorisann Frames, the home health nurse at [phone]. Apparently has been bleeding for months now. Hemoglobin has historically been in 11-12 range over the past several months. Not able to see the patient today. My recommendation was to get the patient to the emergency room so she could be assessed, have labs drawn etc. This is the best approach. If her bleeding has picked up significantly then Lovenox should be held but final recommendations will be made after she is assessed. I called Dr. Laurence Ferrari the in the ED at [facility] and made him aware of patient's likely visit.

## 2013-02-18 NOTE — Telephone Encounter (Signed)
Tonya from [facility] called to report pt has dark eyes, complexion is pale and SOB noted while aide bathing her and is bleeding from rectum.  Archie Patten is requesting Dr Modesto Charon to call Dr Jena Gauss @ [phone] to see if will admit for 24 hrs to bridge in AM and prep then do the procedure. Archie Patten states they are unable to do any blood work on her since pt is going to outpatient PT and OT due to her insurance.    Archie Patten would like Dr Jena Gauss to call her at [phone]

## 2013-02-18 NOTE — Telephone Encounter (Signed)
Call from [place] she stated she spoke with dr Carla Drape and he told her for pt to stop lovenox and no more coumadin for 4 days and he wants her to go to hospital er  Archie Patten states will relay this to the patient and her family.

## 2013-02-20 ENCOUNTER — Encounter (HOSPITAL_COMMUNITY): Payer: Self-pay | Admitting: *Deleted

## 2013-02-20 ENCOUNTER — Encounter (HOSPITAL_COMMUNITY)
Admission: RE | Disposition: A | Discharge status: Home / Self Care | Payer: Self-pay | Source: Ambulatory Visit | Attending: Internal Medicine

## 2013-02-20 ENCOUNTER — Ambulatory Visit (HOSPITAL_COMMUNITY)
Admission: RE | Admit: 2013-02-20 | Discharge: 2013-02-20 | Disposition: A | Discharge status: Home / Self Care | Payer: PRIVATE HEALTH INSURANCE | Source: Ambulatory Visit | Attending: Internal Medicine | Admitting: Internal Medicine

## 2013-02-20 DIAGNOSIS — Z01812 Encounter for preprocedural laboratory examination: Secondary | ICD-10-CM | POA: Insufficient documentation

## 2013-02-20 DIAGNOSIS — J449 Chronic obstructive pulmonary disease, unspecified: Secondary | ICD-10-CM | POA: Insufficient documentation

## 2013-02-20 DIAGNOSIS — R1310 Dysphagia, unspecified: Secondary | ICD-10-CM

## 2013-02-20 DIAGNOSIS — D130 Benign neoplasm of esophagus: Secondary | ICD-10-CM | POA: Insufficient documentation

## 2013-02-20 DIAGNOSIS — K2940 Chronic atrophic gastritis without bleeding: Secondary | ICD-10-CM | POA: Insufficient documentation

## 2013-02-20 DIAGNOSIS — E119 Type 2 diabetes mellitus without complications: Secondary | ICD-10-CM | POA: Insufficient documentation

## 2013-02-20 DIAGNOSIS — A048 Other specified bacterial intestinal infections: Secondary | ICD-10-CM | POA: Insufficient documentation

## 2013-02-20 DIAGNOSIS — D649 Anemia, unspecified: Secondary | ICD-10-CM

## 2013-02-20 DIAGNOSIS — K5730 Diverticulosis of large intestine without perforation or abscess without bleeding: Secondary | ICD-10-CM

## 2013-02-20 DIAGNOSIS — K921 Melena: Secondary | ICD-10-CM

## 2013-02-20 DIAGNOSIS — R933 Abnormal findings on diagnostic imaging of other parts of digestive tract: Secondary | ICD-10-CM

## 2013-02-20 DIAGNOSIS — K625 Hemorrhage of anus and rectum: Secondary | ICD-10-CM

## 2013-02-20 DIAGNOSIS — I10 Essential (primary) hypertension: Secondary | ICD-10-CM | POA: Insufficient documentation

## 2013-02-20 DIAGNOSIS — K5900 Constipation, unspecified: Secondary | ICD-10-CM

## 2013-02-20 DIAGNOSIS — J4489 Other specified chronic obstructive pulmonary disease: Secondary | ICD-10-CM | POA: Insufficient documentation

## 2013-02-20 DIAGNOSIS — K449 Diaphragmatic hernia without obstruction or gangrene: Secondary | ICD-10-CM

## 2013-02-20 HISTORY — PX: COLONOSCOPY WITH ESOPHAGOGASTRODUODENOSCOPY (EGD): SHX5779

## 2013-02-20 LAB — GLUCOSE, CAPILLARY: Glucose-Capillary: 98 mg/dL (ref 70–99)

## 2013-02-20 LAB — CBC
Hemoglobin: 11.6 g/dL — ABNORMAL LOW (ref 12.0–15.0)
MCHC: 31.9 g/dL (ref 30.0–36.0)
MCV: 93.1 fL (ref 78.0–100.0)
RBC: 3.91 MIL/uL (ref 3.87–5.11)
RDW: 14.7 % (ref 11.5–15.5)
WBC: 6.5 10*3/uL (ref 4.0–10.5)

## 2013-02-20 SURGERY — COLONOSCOPY WITH ESOPHAGOGASTRODUODENOSCOPY (EGD)
Anesthesia: Moderate Sedation

## 2013-02-20 MED ORDER — ONDANSETRON HCL 4 MG/2ML IJ SOLN
INTRAMUSCULAR | Status: AC
  Filled 2013-02-20: qty 2

## 2013-02-20 MED ORDER — SODIUM CHLORIDE 0.9 % IV SOLN
INTRAVENOUS | Status: DC
  Administered 2013-02-20: 13:00:00 via INTRAVENOUS

## 2013-02-20 MED ORDER — MEPERIDINE HCL 100 MG/ML IJ SOLN
INTRAMUSCULAR | Status: AC
  Filled 2013-02-20: qty 1

## 2013-02-20 MED ORDER — MIDAZOLAM HCL 5 MG/5ML IJ SOLN
INTRAMUSCULAR | Status: DC | PRN
  Administered 2013-02-20 (×5): 1 mg via INTRAVENOUS

## 2013-02-20 MED ORDER — MIDAZOLAM HCL 5 MG/5ML IJ SOLN
INTRAMUSCULAR | Status: AC
  Filled 2013-02-20: qty 5

## 2013-02-20 MED ORDER — MEPERIDINE HCL 100 MG/ML IJ SOLN
INTRAMUSCULAR | Status: DC | PRN
  Administered 2013-02-20 (×4): 25 mg via INTRAVENOUS

## 2013-02-20 MED ORDER — BUTAMBEN-TETRACAINE-BENZOCAINE 2-2-14 % EX AERO
INHALATION_SPRAY | CUTANEOUS | Status: DC | PRN
  Administered 2013-02-20: 2 via TOPICAL

## 2013-02-20 MED ORDER — STERILE WATER FOR IRRIGATION IR SOLN
Status: DC | PRN
  Administered 2013-02-20: 13:00:00

## 2013-02-20 NOTE — Discharge Instructions (Signed)
EGD Discharge instructions Please read the instructions outlined below and refer to this sheet in the next few weeks. These discharge instructions provide you with general information on caring for yourself after you leave the hospital. Your doctor may also give you specific instructions. While your treatment has been planned according to the most current medical practices available, unavoidable complications occasionally occur. If you have any problems or questions after discharge, please call your doctor. ACTIVITY  You may resume your regular activity but move at a slower pace for the next 24 hours.   Take frequent rest periods for the next 24 hours.   Walking will help expel (get rid of) the air and reduce the bloated feeling in your abdomen.   No driving for 24 hours (because of the anesthesia (medicine) used during the test).   You may shower.   Do not sign any important legal documents or operate any machinery for 24 hours (because of the anesthesia used during the test).  NUTRITION  Drink plenty of fluids.   You may resume your normal diet.   Begin with a light meal and progress to your normal diet.   Avoid alcoholic beverages for 24 hours or as instructed by your caregiver.  MEDICATIONS  You may resume your normal medications unless your caregiver tells you otherwise.  WHAT YOU CAN EXPECT TODAY  You may experience abdominal discomfort such as a feeling of fullness or gas pains.  FOLLOW-UP  Your doctor will discuss the results of your test with you.  SEEK IMMEDIATE MEDICAL ATTENTION IF ANY OF THE FOLLOWING OCCUR:  Excessive nausea (feeling sick to your stomach) and/or vomiting.   Severe abdominal pain and distention (swelling).   Trouble swallowing.   Temperature over 101 F (37.8 C).   Rectal bleeding or vomiting of blood.   Colonoscopy Discharge Instructions  Read the instructions outlined below and refer to this sheet in the next few weeks. These  discharge instructions provide you with general information on caring for yourself after you leave the hospital. Your doctor may also give you specific instructions. While your treatment has been planned according to the most current medical practices available, unavoidable complications occasionally occur. If you have any problems or questions after discharge, call Dr. Gala Romney at [phone]. ACTIVITY  You may resume your regular activity, but move at a slower pace for the next 24 hours.   Take frequent rest periods for the next 24 hours.   Walking will help get rid of the air and reduce the bloated feeling in your belly (abdomen).   No driving for 24 hours (because of the medicine (anesthesia) used during the test).    Do not sign any important legal documents or operate any machinery for 24 hours (because of the anesthesia used during the test).  NUTRITION  Drink plenty of fluids.   You may resume your normal diet as instructed by your doctor.   Begin with a light meal and progress to your normal diet. Heavy or fried foods are harder to digest and may make you feel sick to your stomach (nauseated).   Avoid alcoholic beverages for 24 hours or as instructed.  MEDICATIONS  You may resume your normal medications unless your doctor tells you otherwise.  WHAT YOU CAN EXPECT TODAY  Some feelings of bloating in the abdomen.   Passage of more gas than usual.   Spotting of blood in your stool or on the toilet paper.  IF YOU HAD POLYPS REMOVED DURING THE COLONOSCOPY:  No aspirin products for 7 days or as instructed.   No alcohol for 7 days or as instructed.   Eat a soft diet for the next 24 hours.  FINDING OUT THE RESULTS OF YOUR TEST Not all test results are available during your visit. If your test results are not back during the visit, make an appointment with your caregiver to find out the results. Do not assume everything is normal if you have not heard from your caregiver or the  medical facility. It is important for you to follow up on all of your test results.  SEEK IMMEDIATE MEDICAL ATTENTION IF:  You have more than a spotting of blood in your stool.   Your belly is swollen (abdominal distention).   You are nauseated or vomiting.   You have a temperature over 101.   You have abdominal pain or discomfort that is severe or gets worse throughout the day.    Diverticulosis information provided  Begin Benefiber 2 teaspoons twice daily  Resume Coumadin today. Do not resume Lovenox.  Further recommendations to follow pending review of pathology report   Diverticulosis Diverticulosis is a common condition that develops when small pouches (diverticula) form in the wall of the colon. The risk of diverticulosis increases with age. It happens more often in people who eat a low-fiber diet. Most individuals with diverticulosis have no symptoms. Those individuals with symptoms usually experience abdominal pain, constipation, or loose stools (diarrhea). HOME CARE INSTRUCTIONS   Increase the amount of fiber in your diet as directed by your caregiver or dietician. This may reduce symptoms of diverticulosis.  Your caregiver may recommend taking a dietary fiber supplement.  Drink at least 6 to 8 glasses of water each day to prevent constipation.  Try not to strain when you have a bowel movement.  Your caregiver may recommend avoiding nuts and seeds to prevent complications, although this is still an uncertain benefit.  Only take over-the-counter or prescription medicines for pain, discomfort, or fever as directed by your caregiver. FOODS WITH HIGH FIBER CONTENT INCLUDE:  Fruits. Apple, peach, pear, tangerine, raisins, prunes.  Vegetables. Brussels sprouts, asparagus, broccoli, cabbage, carrot, cauliflower, romaine lettuce, spinach, summer squash, tomato, winter squash, zucchini.  Starchy Vegetables. Baked beans, kidney beans, lima beans, split peas, lentils,  potatoes (with skin).  Grains. Whole wheat bread, brown rice, bran flake cereal, plain oatmeal, white rice, shredded wheat, bran muffins. SEEK IMMEDIATE MEDICAL CARE IF:   You develop increasing pain or severe bloating.  You have an oral temperature above 102 F (38.9 C), not controlled by medicine.  You develop vomiting or bowel movements that are bloody or black. Document Released: 02/03/2004 Document Revised: 07/31/2011 Document Reviewed: 10/06/2009 [facility] Patient Information 2014 [place], [place].

## 2013-02-20 NOTE — Op Note (Signed)
[facility] [address] [place], [address]   ENDOSCOPY PROCEDURE REPORT  PATIENT: Brittany Clarke, Brittany Clarke  MR#: [id number] BIRTHDATE: [birth year] , 72  yrs. old GENDER: Female ENDOSCOPIST: R.  Roetta Sessions, MD FACP FACG REFERRED BY:  Leodis Sias, M.D. PROCEDURE DATE:  02/20/2013 PROCEDURE:     EGD with Elease Hashimoto dilation, gastric and esophageal biopsy  INDICATIONS:     Esophageal dysphagia  INFORMED CONSENT:   The risks, benefits, limitations, alternatives and imponderables have been discussed.  The potential for biopsy, esophogeal dilation, etc. have also been reviewed.  Questions have been answered.  All parties agreeable.  Please see the history and physical in the medical record for more information.  MEDICATIONS:    Versed 3 mg IV and Demerol 75 mg IV in divided doses. Cetacaine spray.  DESCRIPTION OF PROCEDURE:   The EG-[id number] ([id number])  endoscope was introduced through the mouth and advanced to the second portion of the duodenum without difficulty or limitations.  The mucosal surfaces were surveyed very carefully during advancement of the scope and upon withdrawal.  Retroflexion view of the proximal stomach and esophagogastric junction was performed.      FINDINGS: Pale mucosal appearing nodules distal esophagus, largest approximately 8 mm. 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 No obstructing lesion. seen no inflammatory changes involving the mucosa observed. The tubular esophagus appeared patent throughout its course. Stomach empty. Small hiatal hernia some "mosaic" changes scattered throughout the gastric mucosa. No ulcer or infiltrating process. Patent pylorus. Normal first and second portion of the duodenum  THERAPEUTIC / DIAGNOSTIC MANEUVERS PERFORMED:  A 54 French Maloney dilator was passed to full insertion easily. A look back revealed no apparent complication related to this maneuver. Subsequently,, biopsies of the gastric mucosa were taken for  histologic study. Finally, one of the nodules in the distal esophagus was biopsied separately.   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 Abnormal gastric mucosa of uncertain significance-status post biopsy. Small hiatal hernia.  RECOMMENDATIONS:  Followup on pathology. See colonoscopy report.    _______________________________ R. Roetta Sessions, MD FACP [facility] eSigned:  R. Roetta Sessions, MD FACP [facility] 02/20/2013 1:27 PM     CC:  PATIENT NAME:  Brittany Clarke, Brittany Clarke MR#: [id number]

## 2013-02-20 NOTE — Progress Notes (Signed)
Lab in to draw cbc 

## 2013-02-20 NOTE — Interval H&P Note (Signed)
History and Physical Interval Note:  02/20/2013 12:57 PM  [name]  has presented today for surgery, with the diagnosis of Anemia, Constipation, Rectal Bleeding and Esophageal Dysphagia  The various methods of treatment have been discussed with the patient and family. After consideration of risks, benefits and other options for treatment, the patient has consented to  Procedure(s): COLONOSCOPY WITH ESOPHAGOGASTRODUODENOSCOPY (EGD) (N/A) as a surgical intervention .  The patient's history has been reviewed, patient examined, no change in status, stable for surgery.  I have reviewed the patient's chart and labs.  Questions were answered to the patient's satisfaction.     Brittany Clarke

## 2013-02-20 NOTE — Op Note (Signed)
[facility] [address] [place], [address]   COLONOSCOPY PROCEDURE REPORT  PATIENT: Brittany Clarke, Brittany Clarke  MR#:         [id number] BIRTHDATE: 05-Aug-1940 , 72  yrs. old GENDER: Female ENDOSCOPIST: R.  Roetta Sessions, MD FACP FACG REFERRED BY:  Leodis Sias, M.D. PROCEDURE DATE:  02/20/2013 PROCEDURE:     Diagnostic colonoscopy  INDICATIONS: hematochezia  INFORMED CONSENT:  The risks, benefits, alternatives and imponderables including but not limited to bleeding, perforation as well as the possibility of a missed lesion have been reviewed.  The potential for biopsy, lesion removal, etc. have also been discussed.  Questions have been answered.  All parties agreeable. Please see the history and physical in the medical record for more information.  MEDICATIONS: Versed 5 mg IV and Demerol 100 mg IV in divided doses. Zofran 4 mg IV.  DESCRIPTION OF PROCEDURE:  After a digital rectal exam was performed, the EC-[id number] ([id number])  colonoscope was advanced from the anus through the rectum and colon to the area of the cecum, ileocecal valve and appendiceal orifice.  The cecum was deeply intubated.  These structures were well-seen and photographed for the record.  From the level of the cecum and ileocecal valve, the scope was slowly and cautiously withdrawn.  The mucosal surfaces were carefully surveyed utilizing scope tip deflection to facilitate fold flattening as needed.  The scope was pulled down into the rectum where a thorough examination including retroflexion was performed.    FINDINGS:  Adequate preparation. Single rectal diverticulum.; otherwise, normal appearing rectal mucosa. Scattered left-sided diverticula; the remainder of the colonic mucosa appeared normal.  It is notable that I identified a localized area of slightly bloody colonic effluent in the mid sigmoid colon with a small clot floating in it. It was not associated with any specific diverticulum  or other lesion. No actively bleeding lesion identified.  No significant hemorrhoids   THERAPEUTIC / DIAGNOSTIC MANEUVERS PERFORMED:  none  COMPLICATIONS: none  CECAL WITHDRAWAL TIME:  21 minutes  IMPRESSION:  Rectal and colonic diverticulosis. Finding suspicious for recent left colon hemorrhage. I suspect diverticular etiology. No discrete suspect lesion to treat.    RECOMMENDATIONS: Resume Coumadin today. Would withhold Lovenox bridging, particularly in view of recent biopsies. Add fiber supplement like Benefiber 2 teaspoons twice daily. Reference CBC today.  If recurrent hematochezia as noted, I would recommend having her come up to endoscopy right away for an unprepped sigmoidoscopy while leaving her on anticoagulation in the hopes that a treatable lesion can't be localized.   _______________________________ eSigned:  R. Roetta Sessions, MD FACP [facility] 02/20/2013 2:21 PM   CC:    PATIENT NAME:  Ayriana, Wix MR#: [id number]

## 2013-02-20 NOTE — H&P (View-Only) (Signed)
Primary Care Physician:  Redmond Baseman, MD  Primary Gastroenterologist:  Roetta Sessions, MD   Chief Complaint  Patient presents with  . Rectal Bleeding    HPI:  Brittany Clarke is a 72 y.o. female here for further evaluation of intermittent rectal bleeding. She is having bright red blood per rectum almost with every bowel movement. Previously had had some constipation. Too much diarrhea on Linzess. Now on MiraLax daily. Stools are soft. Some rectal pain. Was evaluated in emergency department in July for rectal bleeding. Recently saw her PCP who performed an anoscopy in the office. Prior red blood per rectum mixed with a small amount of stool noted. No hemorrhoid seen. Source of bleeding could not be identified.Bowel movement every day to QOD. Occasional Mid abdominal pain unrelated to meals. Not affected by BMs. Uses walker and quad cane around her house. 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 Had been in physical therapy. No heartburn. Meats/pills dysphagia. Coumadin since CVA.  Current Outpatient Prescriptions  Medication Sig Dispense Refill  . acetaminophen (TYLENOL) 500 MG tablet Take 500 mg by mouth every 6 (six) hours as needed. For pain      . albuterol (PROVENTIL) (5 MG/ML) 0.5% nebulizer solution Take 0.5 mLs (2.5 mg total) by nebulization every 2 (two) hours as needed for wheezing or shortness of breath.  20 mL  1  . allopurinol (ZYLOPRIM) 300 MG tablet Take 300 mg by mouth every morning.      . baclofen (LIORESAL) 10 MG tablet Take 10 mg by mouth 3 (three) times daily.      Marland Kitchen ezetimibe-simvastatin (VYTORIN) 10-20 MG per tablet Take 1 tablet by mouth daily.  30 tablet  5  . feeding supplement (GLUCERNA SHAKE) LIQD Take 237 mLs by mouth 3 (three) times daily between meals.  60 Can  1  . furosemide (LASIX) 20 MG tablet Take 1 tablet (20 mg total) by mouth daily as needed.  30 tablet  3  . gabapentin (NEURONTIN) 400 MG capsule Take 1 capsule (400 mg total) by mouth every evening.   30 capsule  2  . HYDROcodone-acetaminophen (NORCO) 7.5-325 MG per tablet Take 1 tablet by mouth at bedtime.  30 tablet  0  . loratadine (CLARITIN) 10 MG tablet Take 10 mg by mouth daily.      Marland Kitchen losartan (COZAAR) 25 MG tablet Take 25 mg by mouth daily.      . metFORMIN (GLUCOPHAGE) 500 MG tablet Take 500 mg by mouth 2 (two) times daily with a meal.      . metoprolol tartrate (LOPRESSOR) 25 MG tablet Take 0.5 tablets (12.5 mg total) by mouth 2 (two) times daily.  30 tablet  5  . montelukast (SINGULAIR) 10 MG tablet Take 10 mg by mouth at bedtime.      Marland Kitchen neomycin-polymyxin-hydrocortisone (CORTISPORIN) 3.5-10000-1 otic suspension Place 3 drops into the left ear 4 (four) times daily.  10 mL  0  . polyethylene glycol (MIRALAX / GLYCOLAX) packet Take 17 g by mouth daily.  14 each  2  . warfarin (COUMADIN) 5 MG tablet Take 5 mg by mouth daily.        No current facility-administered medications for this visit.    Allergies as of 02/11/2013 - Review Complete 02/11/2013  Allergen Reaction Noted  . Ultram [tramadol] Shortness Of Breath 06/10/2012    Past Medical History  Diagnosis Date  . CHF (congestive heart failure)   . Diabetes mellitus without complication   . COPD (chronic obstructive pulmonary  disease)   . Hypertension   . Asthma   . Shortness of breath   . Stroke     2013  . Anxiety   . 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 Pneumonia   . Gout     Past Surgical History  Procedure Laterality Date  . Breast surgery    . Tubal ligation    . Colonoscopy  04/04/2002    UJW:[id number] hemorrhoids, otherwise, normal rectum/Scattered left sided diverticula/Somewhat adenomatous appearing ileocecal valve which is probably normal but this area was biopsied. The remainder of the colonic mucosa appeared   normal  . Trigger finger surgery      twice    Family History  Problem Relation Age of Onset  . Colon cancer Neg Hx   . Liver disease Neg Hx     History    Social History  . Marital Status: Legally Separated    Spouse Name: N/A    Number of Children: 5  . Years of Education: N/A   Occupational History  . Not on file.   Social History Main Topics  . Smoking status: Never Smoker   . Smokeless tobacco: Not on file  . Alcohol Use: No  . Drug Use: No  . Sexual Activity:    Other Topics Concern  . Not on file   Social History Narrative  . No narrative on file      ROS:  General: Negative for anorexia, weight loss, fever, chills, fatigue, weakness. Eyes: Negative for vision changes.  ENT: Negative for hoarseness, difficulty swallowing , nasal congestion. CV: Negative for chest pain, angina, palpitations, dyspnea on exertion, peripheral edema.  Respiratory: Negative for dyspnea at rest, dyspnea on exertion, cough, sputum, wheezing.  GI: See history of present illness. GU:  Negative for dysuria, hematuria, urinary incontinence, urinary frequency, nocturnal urination.  MS: Negative for joint pain. Chronic low back pain.  Derm: Negative for rash or itching.  Neuro: Negative for seizure, frequent headaches, memory loss, confusion. Right hemiparesis Psych: Negative for anxiety, depression, suicidal ideation, hallucinations.  Endo: Negative for unusual weight change.  Heme: Negative for bruising or bleeding. Allergy: Negative for rash or hives.    Physical Examination:  BP 125/83  Pulse 86  Temp(Src) 97.8 F (36.6 C) (Oral)  Ht 5\' 5"  (1.651 m)   General: Well-nourished, well-developed in no acute distress. Accompanied by aide. Head: Normocephalic, atraumatic.   Eyes: Conjunctiva pink, no icterus. Mouth: Oropharyngeal mucosa moist and pink , no lesions erythema or exudate. Neck: Supple without thyromegaly, masses, or lymphadenopathy.  Lungs: Clear to auscultation bilaterally.  Heart: Regular rate and rhythm, no murmurs rubs or gallops.  Abdomen: Bowel sounds are normal, nontender, nondistended, no hepatosplenomegaly or  masses, no abdominal bruits or    hernia , no rebound or guarding.   Rectal: defered Extremities: No lower extremity edema. No clubbing or deformities.  Neuro: Alert and oriented x 4 , grossly normal neurologically.  Skin: Warm and dry, no rash or jaundice.   Psych: Alert and cooperative, normal mood and affect.  Labs: Lab Results  Component Value Date   WBC 8.7 01/24/2013   HGB 11.7* 01/27/2013   HCT 39.3 01/24/2013   MCV 89.6 01/24/2013   PLT 253 11/25/2012   Lab Results  Component Value Date   INR 2.3 01/27/2013   INR 2.4 01/24/2013   INR 2.3 01/14/2013     Imaging Studies: No results found.

## 2013-02-24 ENCOUNTER — Ambulatory Visit (INDEPENDENT_AMBULATORY_CARE_PROVIDER_SITE_OTHER): Payer: Medicare Other | Admitting: Pharmacist

## 2013-02-24 DIAGNOSIS — I639 Cerebral infarction, unspecified: Secondary | ICD-10-CM

## 2013-02-24 DIAGNOSIS — I6350 Cerebral infarction due to unspecified occlusion or stenosis of unspecified cerebral artery: Secondary | ICD-10-CM

## 2013-02-24 LAB — POCT INR: INR: 2

## 2013-02-24 MED ORDER — WARFARIN SODIUM 5 MG PO TABS: ORAL_TABLET | ORAL | Status: DC

## 2013-02-24 NOTE — Patient Instructions (Signed)
Anticoagulation Dose Instructions as of 02/24/2013     Brittany Clarke Tue Wed Thu Fri Sat   New Dose 5 mg 5 mg 5 mg 2.5 mg 5 mg 5 mg 5 mg    Description       Decrease to 1 tablet daily except 1/2 tablet on wednesdays     INR was 2.0 today

## 2013-02-25 ENCOUNTER — Telehealth: Payer: Self-pay

## 2013-02-25 ENCOUNTER — Encounter: Payer: Self-pay | Admitting: Internal Medicine

## 2013-02-25 ENCOUNTER — Encounter (HOSPITAL_COMMUNITY): Payer: Self-pay | Admitting: Internal Medicine

## 2013-02-25 NOTE — Telephone Encounter (Signed)
Message copied by Myra Rude on Tue Feb 25, 2013  8:59 AM ------      Message from: Corbin Ade      Created: Tue Feb 25, 2013  8:13 AM       Patient needs pylera x10 days. Also needs Protonix 40 mg twice daily x10 days simultaneously. Decrease Coumadin dose by 50% while on this treatment. Check INR 5 days into therapy please.  Will need it end up on regular dose of Coumadin following treatment. ------

## 2013-02-26 NOTE — Telephone Encounter (Signed)
pts Aide is aware. She stated they just changed her coumadin dosage 2 days ago. Will contact Tammy Eckard, who manages her coumadin for instructions, prior to cutting dosage in half again. Hpylori abx have not been sent to the pharmacy yet and will send them as soon as we get clarification on coumadin.   Forwarding note to Tammy for advise on coumadin and follow up INR. Please advise.

## 2013-02-26 NOTE — Telephone Encounter (Signed)
Tried to call pt- NA 

## 2013-02-27 ENCOUNTER — Other Ambulatory Visit: Payer: Self-pay | Admitting: Internal Medicine

## 2013-02-27 MED ORDER — PANTOPRAZOLE SODIUM 40 MG PO TBEC: 40.0000 mg | DELAYED_RELEASE_TABLET | Freq: Two times a day (BID) | ORAL | Status: DC

## 2013-02-27 MED ORDER — BIS SUBCIT-METRONID-TETRACYC 140-125-125 MG PO CAPS: 3.0000 | ORAL_CAPSULE | Freq: Three times a day (TID) | ORAL | Status: DC

## 2013-02-27 NOTE — Telephone Encounter (Signed)
Thank you! rx's have been sent to the pharmacy.

## 2013-02-27 NOTE — Telephone Encounter (Signed)
Spoke with Mrs. Pai's nursing aid.  Instructed to take 1/2 tablet of warfarin since starting Pylera (contains metronidazole which can increase INR)  and protonix.  Appt given to check INR/Protime Monday, October 13th at 11:50am.  I will forward message to Hendricks Limes at Dr. Luvenia Starch office so they can send in Pylera and protonix prescriptions.

## 2013-02-27 NOTE — Telephone Encounter (Signed)
Called patient but her aide had not arrived yet.  Will call back later.

## 2013-02-28 ENCOUNTER — Telehealth: Payer: Self-pay | Admitting: Pharmacist

## 2013-02-28 NOTE — Telephone Encounter (Signed)
Spoke with patient's nursing aid.  She is to resume her regular dose of warfarin of 5mg  daily except 2.5 mg on wednesdays.  They will call me when Pylera is approved and she starts taking it.  At that time will take 1/2 tablet of warfarin daily and recheck protime/INR in 3-5 days.

## 2013-02-28 NOTE — Telephone Encounter (Signed)
Message copied by Henrene Pastor on Fri Feb 28, 2013  3:51 PM ------      Message from: Myra Rude      Created: Fri Feb 28, 2013 12:18 PM       Hi Trashaun Streight, I have been working on a prior authorization for the pylera for this pt. I'm not sure when it will be available for her to take. I called and left her a message about it. ------

## 2013-03-03 ENCOUNTER — Telehealth: Payer: Self-pay | Admitting: Family Medicine

## 2013-03-03 NOTE — Telephone Encounter (Signed)
Please advise 

## 2013-03-04 ENCOUNTER — Other Ambulatory Visit: Payer: Self-pay | Admitting: Family Medicine

## 2013-03-04 ENCOUNTER — Ambulatory Visit: Payer: PRIVATE HEALTH INSURANCE | Admitting: Gastroenterology

## 2013-03-04 DIAGNOSIS — E11610 Type 2 diabetes mellitus with diabetic neuropathic arthropathy: Secondary | ICD-10-CM

## 2013-03-04 MED ORDER — HYDROCODONE-ACETAMINOPHEN 7.5-325 MG PO TABS: 1.0000 | ORAL_TABLET | Freq: Every day | ORAL | Status: DC

## 2013-03-04 NOTE — Telephone Encounter (Signed)
Pt notified to pick up rx hydrocodone.

## 2013-03-04 NOTE — Telephone Encounter (Signed)
Rx ready for pick up. 

## 2013-03-05 ENCOUNTER — Telehealth: Payer: Self-pay | Admitting: Pharmacist

## 2013-03-05 NOTE — Telephone Encounter (Signed)
Since patient is taking ABX for h.pylori treatement she is to take 1/2 tablet of warfarin and recheck protime Monday 03/10/13. Explained to nurse.

## 2013-03-05 NOTE — Telephone Encounter (Signed)
Tried to get appt for tomorrow but patient unable to come due to another appt.  Appt made to recheck protime for Monday, October 20th at 12:20pm.  Patient aware of appt

## 2013-03-07 ENCOUNTER — Ambulatory Visit (INDEPENDENT_AMBULATORY_CARE_PROVIDER_SITE_OTHER): Payer: Medicare Other | Admitting: Family Medicine

## 2013-03-07 ENCOUNTER — Encounter: Payer: Self-pay | Admitting: Family Medicine

## 2013-03-07 DIAGNOSIS — E1149 Type 2 diabetes mellitus with other diabetic neurological complication: Secondary | ICD-10-CM

## 2013-03-07 DIAGNOSIS — R600 Localized edema: Secondary | ICD-10-CM

## 2013-03-07 DIAGNOSIS — R609 Edema, unspecified: Secondary | ICD-10-CM

## 2013-03-07 DIAGNOSIS — E119 Type 2 diabetes mellitus without complications: Secondary | ICD-10-CM

## 2013-03-07 DIAGNOSIS — E11610 Type 2 diabetes mellitus with diabetic neuropathic arthropathy: Secondary | ICD-10-CM | POA: Insufficient documentation

## 2013-03-07 DIAGNOSIS — Z8673 Personal history of transient ischemic attack (TIA), and cerebral infarction without residual deficits: Secondary | ICD-10-CM

## 2013-03-07 DIAGNOSIS — M25579 Pain in unspecified ankle and joints of unspecified foot: Secondary | ICD-10-CM

## 2013-03-07 DIAGNOSIS — M25572 Pain in left ankle and joints of left foot: Secondary | ICD-10-CM

## 2013-03-07 DIAGNOSIS — Z7901 Long term (current) use of anticoagulants: Secondary | ICD-10-CM

## 2013-03-07 NOTE — Progress Notes (Signed)
Patient ID: Brittany Clarke, female   DOB: 1940/08/24, 72 y.o.   MRN: [id number] SUBJECTIVE: CC: Chief Complaint  Patient presents with  . Follow-up    face to face pt requesting diabeetic shoes    HPI: Has foot problems Has had a stroke with right hemiplegia. Left heel calcanus erosion, and charcot foot.  Past Medical History  Diagnosis Date  . CHF (congestive heart failure)   . Diabetes mellitus without complication   . COPD (chronic obstructive pulmonary disease)   . Hypertension   . Asthma   . Shortness of breath   . Stroke     2013  . Anxiety   . 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 Pneumonia   . Gout    Past Surgical History  Procedure Laterality Date  . Breast surgery    . Tubal ligation    . Colonoscopy  04/04/2002    WUJ:[id number] hemorrhoids, otherwise, normal rectum/Scattered left sided diverticula/Somewhat adenomatous appearing ileocecal valve which is probably normal but this area was biopsied. The remainder of the colonic mucosa appeared   normal  . Trigger finger surgery      twice  . Colonoscopy with esophagogastroduodenoscopy (egd) N/A 02/20/2013    Procedure: COLONOSCOPY WITH ESOPHAGOGASTRODUODENOSCOPY (EGD);  Surgeon: Corbin Ade, MD;  Location: [facility];  Service: Endoscopy;  Laterality: N/A;   History   Social History  . Marital Status: Legally Separated    Spouse Name: N/A    Number of Children: 5  . Years of Education: N/A   Occupational History  . Not on file.   Social History Main Topics  . Smoking status: Never Smoker   . Smokeless tobacco: Not on file  . Alcohol Use: No  . Drug Use: No  . Sexual Activity:    Other Topics Concern  . Not on file   Social History Narrative  . No narrative on file   Family History  Problem Relation Age of Onset  . Colon cancer Neg Hx   . Liver disease Neg Hx    Current Outpatient Prescriptions on File Prior to Visit  Medication Sig Dispense Refill  .  acetaminophen (TYLENOL) 500 MG tablet Take 500 mg by mouth every 6 (six) hours as needed. For pain      . albuterol (PROVENTIL) (2.5 MG/3ML) 0.083% nebulizer solution Take 2.5 mg by nebulization every 6 (six) hours as needed for wheezing.      Marland Kitchen allopurinol (ZYLOPRIM) 300 MG tablet Take 300 mg by mouth every morning.      . baclofen (LIORESAL) 10 MG tablet Take 10 mg by mouth 3 (three) times daily.      Marland Kitchen bismuth-metronidazole-tetracycline (PYLERA) 140-125-125 MG per capsule Take 3 capsules by mouth 4 (four) times daily -  before meals and at bedtime.  120 capsule  0  . enoxaparin (LOVENOX) 120 MG/0.8ML injection Inject 110 mg into the skin 2 (two) times daily. Expel 10mg  = 0.06 milliliters, then inject 110mg  =0.74 milliliters subcutaneously every 12 hours      . ezetimibe-simvastatin (VYTORIN) 10-20 MG per tablet Take 1 tablet by mouth daily.  30 tablet  5  . feeding supplement (GLUCERNA SHAKE) LIQD Take 237 mLs by mouth 3 (three) times daily between meals.  60 Can  1  . furosemide (LASIX) 20 MG tablet Take 20 mg by mouth daily as needed for fluid.      Marland Kitchen gabapentin (NEURONTIN) 400 MG capsule Take  1 capsule (400 mg total) by mouth every evening.  30 capsule  2  . HYDROcodone-acetaminophen (NORCO) 7.5-325 MG per tablet Take 1 tablet by mouth at bedtime.  30 tablet  0  . loratadine (CLARITIN) 10 MG tablet Take 10 mg by mouth daily.      Marland Kitchen losartan (COZAAR) 25 MG tablet Take 25 mg by mouth daily.      . metFORMIN (GLUCOPHAGE) 500 MG tablet Take 500 mg by mouth 2 (two) times daily with a meal.      . metoprolol tartrate (LOPRESSOR) 25 MG tablet Take 0.5 tablets (12.5 mg total) by mouth 2 (two) times daily.  30 tablet  5  . montelukast (SINGULAIR) 10 MG tablet Take 10 mg by mouth at bedtime.      Marland Kitchen neomycin-polymyxin-hydrocortisone (CORTISPORIN) 3.5-10000-1 otic suspension Place 3 drops into the left ear daily as needed (Ear Pain).      . pantoprazole (PROTONIX) 40 MG tablet Take 1 tablet (40 mg total)  by mouth 2 (two) times daily. For 10 days, while taking pylera  20 tablet  0  . polyethylene glycol (MIRALAX / GLYCOLAX) packet Take 17 g by mouth daily.  14 each  2  . polyethylene glycol-electrolytes (TRILYTE) 420 G solution Take 4,000 mLs by mouth as directed.  4000 mL  0   No current facility-administered medications on file prior to visit.   Allergies  Allergen Reactions  . Ultram [Tramadol] Shortness Of Breath    There is no immunization history on file for this patient. Prior to Admission medications   Medication Sig Start Date End Date Taking? Authorizing Provider  acetaminophen (TYLENOL) 500 MG tablet Take 500 mg by mouth every 6 (six) hours as needed. For pain    Historical Provider, MD  albuterol (PROVENTIL) (2.5 MG/3ML) 0.083% nebulizer solution Take 2.5 mg by nebulization every 6 (six) hours as needed for wheezing.    Historical Provider, MD  allopurinol (ZYLOPRIM) 300 MG tablet Take 300 mg by mouth every morning.    Historical Provider, MD  baclofen (LIORESAL) 10 MG tablet Take 10 mg by mouth 3 (three) times daily.    Historical Provider, MD  bismuth-metronidazole-tetracycline [facility]) [phone] MG per capsule Take 3 capsules by mouth 4 (four) times daily -  before meals and at bedtime. 02/27/13   Corbin Ade, MD  enoxaparin (LOVENOX) 120 MG/0.8ML injection Inject 110 mg into the skin 2 (two) times daily. Expel 10mg  = 0.06 milliliters, then inject 110mg  =0.74 milliliters subcutaneously every 12 hours 02/13/13   Tammy Eckard, PHARMD  ezetimibe-simvastatin (VYTORIN) 10-20 MG per tablet Take 1 tablet by mouth daily. 12/02/12   Ileana Ladd, MD  feeding supplement (GLUCERNA SHAKE) LIQD Take 237 mLs by mouth 3 (three) times daily between meals. 06/12/12   Tora Kindred York, PA-C  furosemide (LASIX) 20 MG tablet Take 20 mg by mouth daily as needed for fluid. 12/02/12   Ileana Ladd, MD  gabapentin (NEURONTIN) 400 MG capsule Take 1 capsule (400 mg total) by mouth every evening.  02/18/13   Ileana Ladd, MD  HYDROcodone-acetaminophen (NORCO) 7.5-325 MG per tablet Take 1 tablet by mouth at bedtime. 03/04/13   Ileana Ladd, MD  loratadine (CLARITIN) 10 MG tablet Take 10 mg by mouth daily.    Historical Provider, MD  losartan (COZAAR) 25 MG tablet Take 25 mg by mouth daily.    Historical Provider, MD  metFORMIN (GLUCOPHAGE) 500 MG tablet Take 500 mg by mouth 2 (two) times daily  with a meal.    Historical Provider, MD  metoprolol tartrate (LOPRESSOR) 25 MG tablet Take 0.5 tablets (12.5 mg total) by mouth 2 (two) times daily. 08/20/12   Ileana Ladd, MD  montelukast (SINGULAIR) 10 MG tablet Take 10 mg by mouth at bedtime.    Historical Provider, MD  neomycin-polymyxin-hydrocortisone (CORTISPORIN) 3.5-10000-1 otic suspension Place 3 drops into the left ear daily as needed (Ear Pain). 12/02/12   Ileana Ladd, MD  pantoprazole (PROTONIX) 40 MG tablet Take 1 tablet (40 mg total) by mouth 2 (two) times daily. For 10 days, while taking pylera 02/27/13   Corbin Ade, MD  polyethylene glycol [facility] Ethelene Hal) packet Take 17 g by mouth daily. 12/02/12   Ileana Ladd, MD  polyethylene glycol-electrolytes (TRILYTE) 420 G solution Take 4,000 mLs by mouth as directed. 02/12/13   Corbin Ade, MD  warfarin (COUMADIN) 5 MG tablet Take 1daily  or as directed by anticoagulation clinic 02/24/13   Tammy Eckard, PHARMD     ROS: As above in the HPI. All other systems are stable or negative.  OBJECTIVE: APPEARANCE:  Patient in no acute distress.The patient appeared well nourished and normally developed. Acyanotic. Waist: VITAL SIGNS:BP 114/75  Pulse 96  Temp(Src) 98.7 F (37.1 C) (Oral)  AAF morbidly Obese SKIN: warm and  Dry without overt rashes, tattoos and scars  HEAD and Neck: without JVD, Head and scalp: normal Eyes:No scleral icterus. Fundi normal, eye movements normal. Ears: Auricle normal, canal normal, Tympanic membranes normal, insufflation normal. Nose:  normal Throat: normal Neck & thyroid: normal  CHEST & LUNGS: Chest wall: normal Lungs: Clear  CVS: Reveals the PMI to be normally located. Regular rhythm, First and Second Heart sounds are normal,  absence of murmurs, rubs or gallops. Peripheral vasculature: Radial pulses: normal Dorsal pedis pulses: normal Posterior pulses: normal  ABDOMEN:  Appearance: normal Benign, no organomegaly, no masses, no Abdominal Aortic enlargement. No Guarding , no rebound. No Bruits. Bowel sounds: normal  RECTAL: N/A GU: N/A  EXTREMETIES: 3+ edematous.bilaterally  MUSCULOSKELETAL:   Joints:left heel soft and boggy  NEUROLOGIC: oriented to time,place and person; right hemiplegia.  ASSESSMENT: Morbid obesity  DM (diabetes mellitus)  Pedal edema  History of CVA (cerebrovascular accident)  Pain in joint, ankle and foot, left  Charcot foot due to diabetes mellitus  Long term (current) use of anticoagulants  PLAN: Rx for DM shoes written and handed to daughter.  No orders of the defined types were placed in this encounter.   Meds ordered this encounter  Medications  . warfarin (COUMADIN) 5 MG tablet    Sig: Take 1daily  or as directed by anticoagulation clinic   Medications Discontinued During This Encounter  Medication Reason  . warfarin (COUMADIN) 5 MG tablet    Return in about 6 weeks (around 04/18/2013). for  Routine follow up  Brecken Walth P. [place] Charon, M.D.

## 2013-03-10 ENCOUNTER — Ambulatory Visit (INDEPENDENT_AMBULATORY_CARE_PROVIDER_SITE_OTHER): Payer: Medicare Other | Admitting: Pharmacist

## 2013-03-10 DIAGNOSIS — I639 Cerebral infarction, unspecified: Secondary | ICD-10-CM

## 2013-03-10 DIAGNOSIS — I6350 Cerebral infarction due to unspecified occlusion or stenosis of unspecified cerebral artery: Secondary | ICD-10-CM

## 2013-03-10 DIAGNOSIS — Z7901 Long term (current) use of anticoagulants: Secondary | ICD-10-CM

## 2013-03-10 LAB — POCT INR: INR: 1.9

## 2013-03-10 NOTE — Patient Instructions (Signed)
Anticoagulation Dose Instructions as of 03/10/2013     Brittany Clarke Tue Wed Thu Fri Sat   New Dose 5 mg 5 mg 5 mg 2.5 mg 5 mg 5 mg 5 mg    Description       Continue 1/2 tablet daily until finished with Pylera pack.  Then restart regular dose of 1 tablet daily except 1/2 tablet on Wednesdays.       INR was 1.9 today

## 2013-03-24 ENCOUNTER — Encounter: Payer: Self-pay | Admitting: *Deleted

## 2013-03-25 ENCOUNTER — Ambulatory Visit (INDEPENDENT_AMBULATORY_CARE_PROVIDER_SITE_OTHER): Payer: Medicare Other | Admitting: Pharmacist Clinician (PhC)/ Clinical Pharmacy Specialist

## 2013-03-25 DIAGNOSIS — I639 Cerebral infarction, unspecified: Secondary | ICD-10-CM

## 2013-03-25 DIAGNOSIS — I6350 Cerebral infarction due to unspecified occlusion or stenosis of unspecified cerebral artery: Secondary | ICD-10-CM

## 2013-03-25 LAB — POCT INR: INR: 2.3

## 2013-04-03 ENCOUNTER — Other Ambulatory Visit: Payer: Self-pay | Admitting: Family Medicine

## 2013-04-03 DIAGNOSIS — E11610 Type 2 diabetes mellitus with diabetic neuropathic arthropathy: Secondary | ICD-10-CM

## 2013-04-03 MED ORDER — HYDROCODONE-ACETAMINOPHEN 7.5-325 MG PO TABS: 1.0000 | ORAL_TABLET | Freq: Every day | ORAL | Status: DC

## 2013-04-03 NOTE — Telephone Encounter (Signed)
Last filled 03/04/13, last seen by [place] Charon 03/07/13. Will print

## 2013-04-03 NOTE — Telephone Encounter (Signed)
rx ready for pickup 

## 2013-04-16 ENCOUNTER — Telehealth: Payer: Self-pay | Admitting: Family Medicine

## 2013-04-16 ENCOUNTER — Ambulatory Visit (INDEPENDENT_AMBULATORY_CARE_PROVIDER_SITE_OTHER): Payer: Medicare Other | Admitting: Family Medicine

## 2013-04-16 ENCOUNTER — Encounter: Payer: Self-pay | Admitting: Family Medicine

## 2013-04-16 VITALS — BP 127/82 | HR 92 | Temp 98.8°F | Ht 65.0 in | Wt 220.0 lb

## 2013-04-16 DIAGNOSIS — Z8673 Personal history of transient ischemic attack (TIA), and cerebral infarction without residual deficits: Secondary | ICD-10-CM

## 2013-04-16 DIAGNOSIS — E11610 Type 2 diabetes mellitus with diabetic neuropathic arthropathy: Secondary | ICD-10-CM

## 2013-04-16 DIAGNOSIS — R609 Edema, unspecified: Secondary | ICD-10-CM

## 2013-04-16 DIAGNOSIS — Z7901 Long term (current) use of anticoagulants: Secondary | ICD-10-CM

## 2013-04-16 DIAGNOSIS — J4489 Other specified chronic obstructive pulmonary disease: Secondary | ICD-10-CM

## 2013-04-16 DIAGNOSIS — Z23 Encounter for immunization: Secondary | ICD-10-CM

## 2013-04-16 DIAGNOSIS — M109 Gout, unspecified: Secondary | ICD-10-CM

## 2013-04-16 DIAGNOSIS — M25579 Pain in unspecified ankle and joints of unspecified foot: Secondary | ICD-10-CM

## 2013-04-16 DIAGNOSIS — J449 Chronic obstructive pulmonary disease, unspecified: Secondary | ICD-10-CM

## 2013-04-16 DIAGNOSIS — K5900 Constipation, unspecified: Secondary | ICD-10-CM

## 2013-04-16 DIAGNOSIS — K5909 Other constipation: Secondary | ICD-10-CM

## 2013-04-16 DIAGNOSIS — R600 Localized edema: Secondary | ICD-10-CM

## 2013-04-16 DIAGNOSIS — I509 Heart failure, unspecified: Secondary | ICD-10-CM

## 2013-04-16 DIAGNOSIS — E119 Type 2 diabetes mellitus without complications: Secondary | ICD-10-CM

## 2013-04-16 DIAGNOSIS — E1149 Type 2 diabetes mellitus with other diabetic neurological complication: Secondary | ICD-10-CM

## 2013-04-16 LAB — POCT GLYCOSYLATED HEMOGLOBIN (HGB A1C): Hemoglobin A1C: 5.7

## 2013-04-16 NOTE — Progress Notes (Signed)
Patient ID: Brittany Clarke, female   DOB: [birth year], 72 y.o.   MRN: [id number] SUBJECTIVE: CC: Chief Complaint  Patient presents with  . Follow-up    wants flu shot per [facility] colo rectal screen . had colonscopy . c/o "numbness burning sensation rt back area    HPI: Patient is here for follow up of Diabetes Mellitus/CVA: Symptoms evaluated: Denies Nocturia ,Denies Urinary Frequency , denies Blurred vision ,deniesDizziness,denies.Dysuria,denies paresthesias, denies extremity pain or ulcers.Marland Kitchendenies chest pain. has had an annual eye exam. do check the feet. Does check CBGs. Average OZH:YQMV Denies episodes of hypoglycemia. Does have an emergency hypoglycemic plan. admits toCompliance with medications. Denies Problems with medications.   Recurring right lateral neuritis symptoms. Has been on Gabapentin for this. Comes and goes especially at nights. Past Medical History  Diagnosis Date  . CHF (congestive heart failure)   . Diabetes mellitus without complication   . COPD (chronic obstructive pulmonary disease)   . Hypertension   . Asthma   . Shortness of breath   . Stroke     2013  . Anxiety   . 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 Pneumonia   . Gout    Past Surgical History  Procedure Laterality Date  . Breast surgery    . Tubal ligation    . Colonoscopy  04/04/2002    HQI:ONGEXBMW hemorrhoids, otherwise, normal rectum/Scattered left sided diverticula/Somewhat adenomatous appearing ileocecal valve which is probably normal but this area was biopsied. The remainder of the colonic mucosa appeared   normal  . Trigger finger surgery      twice  . Colonoscopy with esophagogastroduodenoscopy (egd) N/A 02/20/2013    Procedure: COLONOSCOPY WITH ESOPHAGOGASTRODUODENOSCOPY (EGD);  Surgeon: Corbin Ade, MD;  Location: [facility];  Service: Endoscopy;  Laterality: N/A;   History   Social History  . Marital Status: Legally Separated    Spouse Name: N/A    Number of Children: 5  . Years of Education: N/A   Occupational History  . Not on file.   Social History Main Topics  . Smoking status: Never Smoker   . Smokeless tobacco: Not on file  . Alcohol Use: No  . Drug Use: No  . Sexual Activity:    Other Topics Concern  . Not on file   Social History Narrative  . No narrative on file   Family History  Problem Relation Age of Onset  . Colon cancer Neg Hx   . Liver disease Neg Hx    Current Outpatient Prescriptions on File Prior to Visit  Medication Sig Dispense Refill  . acetaminophen (TYLENOL) 500 MG tablet Take 500 mg by mouth every 6 (six) hours as needed. For pain      . albuterol (PROVENTIL) (2.5 MG/3ML) 0.083% nebulizer solution Take 2.5 mg by nebulization every 6 (six) hours as needed for wheezing.      Marland Kitchen allopurinol (ZYLOPRIM) 300 MG tablet Take 300 mg by mouth every morning.      . baclofen (LIORESAL) 10 MG tablet Take 10 mg by mouth 3 (three) times daily.      Marland Kitchen bismuth-metronidazole-tetracycline (PYLERA) 140-125-125 MG per capsule Take 3 capsules by mouth 4 (four) times daily -  before meals and at bedtime.  120 capsule  0  . enoxaparin (LOVENOX) 120 MG/0.8ML injection Inject 110 mg into the skin 2 (two) times daily. Expel 10mg  = 0.06 milliliters, then inject 110mg  =0.74 milliliters subcutaneously every 12 hours      .  ezetimibe-simvastatin (VYTORIN) 10-20 MG per tablet Take 1 tablet by mouth daily.  30 tablet  5  . feeding supplement (GLUCERNA SHAKE) LIQD Take 237 mLs by mouth 3 (three) times daily between meals.  60 Can  1  . furosemide (LASIX) 20 MG tablet Take 20 mg by mouth daily as needed for fluid.      Marland Kitchen gabapentin (NEURONTIN) 400 MG capsule Take 1 capsule (400 mg total) by mouth every evening.  30 capsule  2  . HYDROcodone-acetaminophen (NORCO) 7.5-325 MG per tablet Take 1 tablet by mouth at bedtime.  30 tablet  0  . loratadine (CLARITIN) 10 MG tablet Take 10 mg by mouth daily.      Marland Kitchen losartan (COZAAR) 25 MG tablet  Take 25 mg by mouth daily.      . metFORMIN (GLUCOPHAGE) 500 MG tablet Take 500 mg by mouth 2 (two) times daily with a meal.      . metoprolol tartrate (LOPRESSOR) 25 MG tablet Take 0.5 tablets (12.5 mg total) by mouth 2 (two) times daily.  30 tablet  5  . montelukast (SINGULAIR) 10 MG tablet Take 10 mg by mouth at bedtime.      Marland Kitchen neomycin-polymyxin-hydrocortisone (CORTISPORIN) 3.5-10000-1 otic suspension Place 3 drops into the left ear daily as needed (Ear Pain).      . pantoprazole (PROTONIX) 40 MG tablet Take 1 tablet (40 mg total) by mouth 2 (two) times daily. For 10 days, while taking pylera  20 tablet  0  . polyethylene glycol (MIRALAX / GLYCOLAX) packet Take 17 g by mouth daily.  14 each  2  . polyethylene glycol-electrolytes (TRILYTE) 420 G solution Take 4,000 mLs by mouth as directed.  4000 mL  0  . warfarin (COUMADIN) 5 MG tablet Take 1daily  or as directed by anticoagulation clinic       No current facility-administered medications on file prior to visit.   Allergies  Allergen Reactions  . Ultram [Tramadol] Shortness Of Breath   Immunization History  Administered Date(s) Administered  . Influenza,inj,Quad PF,36+ Mos 04/16/2013   Prior to Admission medications   Medication Sig Start Date End Date Taking? Authorizing Provider  acetaminophen (TYLENOL) 500 MG tablet Take 500 mg by mouth every 6 (six) hours as needed. For pain   Yes Historical Provider, MD  albuterol (PROVENTIL) (2.5 MG/3ML) 0.083% nebulizer solution Take 2.5 mg by nebulization every 6 (six) hours as needed for wheezing.   Yes Historical Provider, MD  allopurinol (ZYLOPRIM) 300 MG tablet Take 300 mg by mouth every morning.   Yes Historical Provider, MD  baclofen (LIORESAL) 10 MG tablet Take 10 mg by mouth 3 (three) times daily.   Yes Historical Provider, MD  bismuth-metronidazole-tetracycline (PYLERA) [phone] MG per capsule Take 3 capsules by mouth 4 (four) times daily -  before meals and at bedtime. 02/27/13  Yes  Corbin Ade, MD  enoxaparin (LOVENOX) 120 MG/0.8ML injection Inject 110 mg into the skin 2 (two) times daily. Expel 10mg  = 0.06 milliliters, then inject 110mg  =0.74 milliliters subcutaneously every 12 hours 02/13/13  Yes Tammy Eckard, PHARMD  ezetimibe-simvastatin (VYTORIN) 10-20 MG per tablet Take 1 tablet by mouth daily. 12/02/12  Yes Ileana Ladd, MD  feeding supplement (GLUCERNA SHAKE) LIQD Take 237 mLs by mouth 3 (three) times daily between meals. 06/12/12  Yes Marianne L York, PA-C  furosemide (LASIX) 20 MG tablet Take 20 mg by mouth daily as needed for fluid. 12/02/12  Yes Ileana Ladd, MD  gabapentin (NEURONTIN)  400 MG capsule Take 1 capsule (400 mg total) by mouth every evening. 02/18/13  Yes Ileana Ladd, MD  HYDROcodone-acetaminophen (NORCO) 7.5-325 MG per tablet Take 1 tablet by mouth at bedtime. 04/03/13  Yes Mary-Omni Daphine Deutscher, FNP  loratadine (CLARITIN) 10 MG tablet Take 10 mg by mouth daily.   Yes Historical Provider, MD  losartan (COZAAR) 25 MG tablet Take 25 mg by mouth daily.   Yes Historical Provider, MD  metFORMIN (GLUCOPHAGE) 500 MG tablet Take 500 mg by mouth 2 (two) times daily with a meal.   Yes Historical Provider, MD  metoprolol tartrate (LOPRESSOR) 25 MG tablet Take 0.5 tablets (12.5 mg total) by mouth 2 (two) times daily. 08/20/12  Yes Ileana Ladd, MD  montelukast (SINGULAIR) 10 MG tablet Take 10 mg by mouth at bedtime.   Yes Historical Provider, MD  neomycin-polymyxin-hydrocortisone (CORTISPORIN) 3.5-10000-1 otic suspension Place 3 drops into the left ear daily as needed (Ear Pain). 12/02/12  Yes Ileana Ladd, MD  pantoprazole (PROTONIX) 40 MG tablet Take 1 tablet (40 mg total) by mouth 2 (two) times daily. For 10 days, while taking pylera 02/27/13  Yes Corbin Ade, MD  polyethylene glycol [facility] / GLYCOLAX) packet Take 17 g by mouth daily. 12/02/12  Yes Ileana Ladd, MD  polyethylene glycol-electrolytes (TRILYTE) 420 G solution Take 4,000 mLs by mouth as  directed. 02/12/13  Yes Corbin Ade, MD  warfarin (COUMADIN) 5 MG tablet Take 1daily  or as directed by anticoagulation clinic 02/24/13  Yes Tammy Eckard, PHARMD     ROS: As above in the HPI. All other systems are stable or negative.  OBJECTIVE: APPEARANCE:  Patient in no acute distress.The patient appeared well nourished and normally developed. Acyanotic. Waist: VITAL SIGNS:BP 127/82  Pulse 92  Temp(Src) 98.8 F (37.1 C) (Oral)  Ht 5\' 5"  (1.651 m)  Wt 220 lb (99.791 kg)  BMI 36.61 kg/m2 AAF obese in a wheelchair  SKIN: warm and  Dry without overt rashes, tattoos and scars  HEAD and Neck: without JVD, Head and scalp: normal Eyes:No scleral icterus. Fundi normal, eye movements normal. Ears: Auricle normal, canal normal, Tympanic membranes normal, insufflation normal. Nose: normal Throat: normal Neck & thyroid: normal  CHEST & LUNGS: Chest wall: normal Lungs: Clear  CVS: Reveals the PMI to be normally located. Regular rhythm, First and Second Heart sounds are normal,  absence of murmurs, rubs or gallops. Peripheral vasculature: Radial pulses: normal Dorsal pedis pulses: normal Posterior pulses: normal  ABDOMEN:  Appearance: obese Benign, no organomegaly, no masses, no Abdominal Aortic enlargement. No Guarding , no rebound. No Bruits. Bowel sounds: normal  RECTAL: N/A GU: N/A  EXTREMETIES: nonedematous.  MUSCULOSKELETAL:  Spine: normal Joints: intact  NEUROLOGIC: oriented to time,place and person;right hemiplegia. No change in neurologic deficits. Wheelchair bound.   Results for orders placed in visit on 03/25/13  POCT INR      Result Value Range   INR 2.3      ASSESSMENT:  DM (diabetes mellitus) - Plan: POCT glycosylated hemoglobin (Hb A1C), CMP14+EGFR  History of CVA (cerebrovascular accident) - Plan: NMR, lipoprofile  Need for prophylactic vaccination and inoculation against influenza  Long term (current) use of anticoagulants  Morbid  obesity - Plan: NMR, lipoprofile  Pain in joint, ankle and foot, unspecified laterality  Charcot foot due to diabetes mellitus  Pedal edema  CHF (congestive heart failure) - Plan: NMR, lipoprofile  Chronic constipation  Constipation  COPD (chronic obstructive pulmonary disease)  Gout -  Plan: Uric acid  PLAN: Same medications. Forms for DMV filled. Patient had colonoscopy. Patient reciebved flushot today. Orders Placed This Encounter  Procedures  . CMP14+EGFR  . NMR, lipoprofile  . Uric acid  . POCT glycosylated hemoglobin (Hb A1C)   No orders of the defined types were placed in this encounter.   There are no discontinued medications. Return in about 3 months (around 07/17/2013) for Recheck medical problems.  Lynanne Delgreco P. Modesto Charon, M.D.

## 2013-04-18 ENCOUNTER — Ambulatory Visit: Payer: Medicare Other | Admitting: Family Medicine

## 2013-04-19 LAB — URIC ACID: Uric Acid: 3.2 mg/dL (ref 2.5–7.1)

## 2013-04-19 LAB — CMP14+EGFR
ALT: 10 IU/L (ref 0–32)
AST: 14 IU/L (ref 0–40)
Albumin/Globulin Ratio: 1.5 (ref 1.1–2.5)
Albumin: 4 g/dL (ref 3.5–4.8)
Alkaline Phosphatase: 78 IU/L (ref 39–117)
BUN/Creatinine Ratio: 33 — ABNORMAL HIGH (ref 11–26)
BUN: 13 mg/dL (ref 8–27)
CO2: 22 mmol/L (ref 18–29)
Calcium: 9.8 mg/dL (ref 8.6–10.2)
Chloride: 102 mmol/L (ref 97–108)
Creatinine, Ser: 0.4 mg/dL — ABNORMAL LOW (ref 0.57–1.00)
GFR calc Af Amer: 121 mL/min/{1.73_m2} (ref >59)
GFR calc non Af Amer: 105 mL/min/{1.73_m2} (ref >59)
Globulin, Total: 2.7 g/dL (ref 1.5–4.5)
Glucose: 105 mg/dL — ABNORMAL HIGH (ref 65–99)
Potassium: 4.5 mmol/L (ref 3.5–5.2)
Sodium: 141 mmol/L (ref 134–144)
Total Bilirubin: 0.2 mg/dL (ref 0.0–1.2)
Total Protein: 6.7 g/dL (ref 6.0–8.5)

## 2013-04-19 LAB — NMR, LIPOPROFILE
Cholesterol: 146 mg/dL (ref <200)
HDL Cholesterol by NMR: 55 mg/dL (ref >=40)
HDL Particle Number: 38.2 umol/L (ref >=30.5)
LDL Particle Number: 1153 nmol/L — ABNORMAL HIGH (ref <1000)
LDL Size: 20.2 nm — ABNORMAL LOW (ref >20.5)
LDLC SERPL CALC-MCNC: 75 mg/dL (ref <100)
LP-IR Score: <25 (ref <=45)
Small LDL Particle Number: 825 nmol/L — ABNORMAL HIGH (ref <=527)
Triglycerides by NMR: 78 mg/dL (ref <150)

## 2013-04-20 DIAGNOSIS — I509 Heart failure, unspecified: Secondary | ICD-10-CM

## 2013-04-20 DIAGNOSIS — E119 Type 2 diabetes mellitus without complications: Secondary | ICD-10-CM

## 2013-04-20 DIAGNOSIS — D649 Anemia, unspecified: Secondary | ICD-10-CM

## 2013-04-20 DIAGNOSIS — J449 Chronic obstructive pulmonary disease, unspecified: Secondary | ICD-10-CM

## 2013-05-05 ENCOUNTER — Telehealth: Payer: Self-pay | Admitting: Family Medicine

## 2013-05-06 ENCOUNTER — Ambulatory Visit (INDEPENDENT_AMBULATORY_CARE_PROVIDER_SITE_OTHER): Payer: Medicare Other | Admitting: Pharmacist Clinician (PhC)/ Clinical Pharmacy Specialist

## 2013-05-06 DIAGNOSIS — E1149 Type 2 diabetes mellitus with other diabetic neurological complication: Secondary | ICD-10-CM

## 2013-05-06 DIAGNOSIS — E11610 Type 2 diabetes mellitus with diabetic neuropathic arthropathy: Secondary | ICD-10-CM

## 2013-05-06 DIAGNOSIS — I639 Cerebral infarction, unspecified: Secondary | ICD-10-CM

## 2013-05-06 DIAGNOSIS — I6350 Cerebral infarction due to unspecified occlusion or stenosis of unspecified cerebral artery: Secondary | ICD-10-CM

## 2013-05-06 LAB — POCT INR: INR: 3.2

## 2013-05-06 MED ORDER — HYDROCODONE-ACETAMINOPHEN 7.5-325 MG PO TABS: 1.0000 | ORAL_TABLET | Freq: Every day | ORAL | Status: DC

## 2013-05-07 ENCOUNTER — Telehealth: Payer: Self-pay | Admitting: Family Medicine

## 2013-05-07 NOTE — Telephone Encounter (Signed)
Actually INR was a little high and Brittany Clarke told her to eat greens yesterday.  Patient has not been eating any green leafy vegetables I recommend patient have 1/2 cup serving of greens once a week.

## 2013-05-10 ENCOUNTER — Other Ambulatory Visit: Payer: Self-pay | Admitting: Family Medicine

## 2013-05-14 ENCOUNTER — Other Ambulatory Visit: Payer: Self-pay | Admitting: Family Medicine

## 2013-05-14 DIAGNOSIS — E119 Type 2 diabetes mellitus without complications: Secondary | ICD-10-CM

## 2013-05-14 MED ORDER — ACCU-CHEK SOFT TOUCH LANCETS MISC: Status: DC

## 2013-05-14 NOTE — Telephone Encounter (Signed)
Patient informed of Rx for lancets  Sent.

## 2013-05-20 ENCOUNTER — Telehealth: Payer: Self-pay

## 2013-05-20 NOTE — Telephone Encounter (Signed)
pts daughter- Lanora Manis is aware.

## 2013-05-20 NOTE — Telephone Encounter (Signed)
I would recommend going to the ED due to her symptoms of SOB, dizziness. Last colonoscopy in Oct 2014 with possible diverticular bleed. Unable to assess exact location. She is on anticoagulation. As her history is vague, would be best triaged in the ED.

## 2013-05-20 NOTE — Telephone Encounter (Signed)
pts daughter- Laverta Baltimore- called- pt is having some pain on her R side at times, has BR blood in her stool x 1 month. Pt is taking benafiber. Pt c/o worsening SOB and dizziness at times. When I asked her if it was still bad despite using her albuterol neb tx (that are on her med list) she stated she hasnt used her breathing tx because she was out and her pcp wont call in anymore for her. She was offered an appt her on Friday 05/23/12 and 1/27,1/28,1/29 and pt refused all appts because she doesn't have a ride. They are wanting to know if there is anything we can do over the phone or should they take her to the ED.

## 2013-05-23 ENCOUNTER — Other Ambulatory Visit: Payer: Self-pay | Admitting: Family Medicine

## 2013-05-26 ENCOUNTER — Telehealth: Payer: Self-pay | Admitting: Family Medicine

## 2013-05-29 ENCOUNTER — Telehealth: Payer: Self-pay | Admitting: Family Medicine

## 2013-05-29 NOTE — Telephone Encounter (Signed)
Left message for Brittany Clarke to return call

## 2013-05-30 ENCOUNTER — Ambulatory Visit (INDEPENDENT_AMBULATORY_CARE_PROVIDER_SITE_OTHER): Payer: Medicare Other | Admitting: Family Medicine

## 2013-05-30 ENCOUNTER — Other Ambulatory Visit: Payer: Self-pay | Admitting: Family Medicine

## 2013-05-30 ENCOUNTER — Encounter: Payer: Self-pay | Admitting: Family Medicine

## 2013-05-30 VITALS — BP 119/82 | HR 90 | Temp 100.1°F | Ht 65.0 in

## 2013-05-30 DIAGNOSIS — R05 Cough: Secondary | ICD-10-CM

## 2013-05-30 DIAGNOSIS — J069 Acute upper respiratory infection, unspecified: Secondary | ICD-10-CM

## 2013-05-30 DIAGNOSIS — R059 Cough, unspecified: Secondary | ICD-10-CM

## 2013-05-30 LAB — POCT INFLUENZA A/B
Influenza A, POC: NEGATIVE
Influenza B, POC: NEGATIVE

## 2013-05-30 MED ORDER — LEVOFLOXACIN 500 MG PO TABS: 500.0000 mg | ORAL_TABLET | Freq: Every day | ORAL | Status: DC

## 2013-05-30 NOTE — Telephone Encounter (Signed)
Per pt sore throat x 3 days  No fever Cough little phlegm clear Hoarse at nighttime   Advised pt to use salt water gargles , use throat lozenges and cool mist humdifier.

## 2013-05-30 NOTE — Progress Notes (Signed)
   Subjective:    Patient ID: Brittany Clarke, female    DOB: [birth year], 73 y.o.   MRN: [id number]  HPI  This 73 y.o. female presents for evaluation of URI and cough.  Review of Systems    No chest pain, SOB, HA, dizziness, vision change, N/V, diarrhea, constipation, dysuria, urinary urgency or frequency, myalgias, arthralgias or rash.  Objective:   Physical Exam  Vital signs noted  Well developed well nourished female.  HEENT - Head atraumatic Normocephalic                Eyes - PERRLA, Conjuctiva - clear Sclera- Clear EOMI                Ears - EAC's Wnl TM's Wnl Gross Hearing                Throat - oropharanx wnl Respiratory - Lungs CTA bilateral Cardiac - RRR S1 and S2 without murmur       Assessment & Plan:  Cough - Plan: POCT Influenza A/B, Zpak as directed.  URI (upper respiratory infection) - Plan: Zpak as directed  Push po fluids, rest, tylenol and motrin otc prn as directed for fever, arthralgias, and myalgias.  Follow up prn if sx's continue or persist.  Lysbeth Penner FNP

## 2013-05-30 NOTE — Telephone Encounter (Signed)
Pt seen today by bill oxford

## 2013-05-30 NOTE — Patient Instructions (Signed)

## 2013-05-30 NOTE — Telephone Encounter (Signed)
Called and left message with "Lorre Nick" to retrun call to get more info on her symptoms

## 2013-06-01 ENCOUNTER — Other Ambulatory Visit: Payer: Self-pay | Admitting: Family Medicine

## 2013-06-01 DIAGNOSIS — I639 Cerebral infarction, unspecified: Secondary | ICD-10-CM

## 2013-06-01 NOTE — Telephone Encounter (Signed)
Referral done in [facility].

## 2013-06-02 ENCOUNTER — Ambulatory Visit (INDEPENDENT_AMBULATORY_CARE_PROVIDER_SITE_OTHER): Payer: Medicare Other | Admitting: Pharmacist

## 2013-06-02 DIAGNOSIS — I639 Cerebral infarction, unspecified: Secondary | ICD-10-CM

## 2013-06-02 DIAGNOSIS — Z7901 Long term (current) use of anticoagulants: Secondary | ICD-10-CM

## 2013-06-02 DIAGNOSIS — I6350 Cerebral infarction due to unspecified occlusion or stenosis of unspecified cerebral artery: Secondary | ICD-10-CM

## 2013-06-02 DIAGNOSIS — Z79899 Other long term (current) drug therapy: Secondary | ICD-10-CM

## 2013-06-02 LAB — POCT CBC
Granulocyte percent: 70.6 %G (ref 37–80)
HEMATOCRIT: 37.7 % (ref 37.7–47.9)
Hemoglobin: 11.3 g/dL — AB (ref 12.2–16.2)
Lymph, poc: 2.3 (ref 0.6–3.4)
MCH, POC: 26.6 pg — AB (ref 27–31.2)
MCHC: 30 g/dL — AB (ref 31.8–35.4)
MCV: 88.5 fL (ref 80–97)
MPV: 7.2 fL (ref 0–99.8)
POC GRANULOCYTE: 5.8 (ref 2–6.9)
POC LYMPH PERCENT: 28.3 %L (ref 10–50)
Platelet Count, POC: 269 10*3/uL (ref 142–424)
RBC: 4.3 M/uL (ref 4.04–5.48)
RDW, POC: 15.1 %
WBC: 8.2 10*3/uL (ref 4.6–10.2)

## 2013-06-02 LAB — POCT INR: INR: 3.5

## 2013-06-02 NOTE — Telephone Encounter (Signed)
Spoke with Brittany Clarke and informed her referral was initiated

## 2013-06-02 NOTE — Patient Instructions (Signed)
Anticoagulation Dose Instructions as of 06/02/2013     Dorene Grebe Tue Wed Thu Fri Sat   New Dose 5 mg 2.5 mg 5 mg 2.5 mg 5 mg 2.5 mg 5 mg    Description       Hold warfarin today, then decrease dose to 1/2 tablet mondays, wednesdays and fridays and 1 tablet all other days.      INR was 3.5 today (a little too thin)

## 2013-06-02 NOTE — Progress Notes (Signed)
Checked CBC today - stable.  Recommend recheck at next protime.  Also suggested patient make follow up appt with GI (Dr Gala Romney) for continued BRBPR when having a BM.

## 2013-06-06 ENCOUNTER — Telehealth: Payer: Self-pay | Admitting: Family Medicine

## 2013-06-06 ENCOUNTER — Other Ambulatory Visit: Payer: Self-pay | Admitting: Family Medicine

## 2013-06-06 ENCOUNTER — Telehealth: Payer: Self-pay | Admitting: *Deleted

## 2013-06-06 DIAGNOSIS — E11610 Type 2 diabetes mellitus with diabetic neuropathic arthropathy: Secondary | ICD-10-CM

## 2013-06-06 DIAGNOSIS — E785 Hyperlipidemia, unspecified: Secondary | ICD-10-CM

## 2013-06-06 MED ORDER — HYDROCODONE-ACETAMINOPHEN 7.5-325 MG PO TABS: 1.0000 | ORAL_TABLET | Freq: Every day | ORAL | Status: DC

## 2013-06-06 MED ORDER — ATORVASTATIN CALCIUM 40 MG PO TABS: 40.0000 mg | ORAL_TABLET | Freq: Every day | ORAL | Status: DC

## 2013-06-06 NOTE — Telephone Encounter (Signed)
Received fax from pharmacy requesting that vytorin by changed to something else due to cost. Please advise and send to [place].

## 2013-06-06 NOTE — Telephone Encounter (Signed)
Rx ready for pick up. 

## 2013-06-06 NOTE — Telephone Encounter (Signed)
Pt aware to pick up rx 

## 2013-06-06 NOTE — Telephone Encounter (Signed)
Call patient : Prescription refilled & sent to pharmacy in [place]. Switched to atorvastatin 40 mg daily.

## 2013-06-10 NOTE — Telephone Encounter (Signed)
Pt states this is fixed

## 2013-06-18 ENCOUNTER — Ambulatory Visit: Payer: PRIVATE HEALTH INSURANCE | Admitting: Physical Therapy

## 2013-06-24 ENCOUNTER — Ambulatory Visit (INDEPENDENT_AMBULATORY_CARE_PROVIDER_SITE_OTHER): Payer: Medicare Other | Admitting: Pharmacist Clinician (PhC)/ Clinical Pharmacy Specialist

## 2013-06-24 DIAGNOSIS — I6350 Cerebral infarction due to unspecified occlusion or stenosis of unspecified cerebral artery: Secondary | ICD-10-CM

## 2013-06-24 DIAGNOSIS — I639 Cerebral infarction, unspecified: Secondary | ICD-10-CM

## 2013-06-24 LAB — POCT INR: INR: 4.1

## 2013-06-25 ENCOUNTER — Ambulatory Visit (HOSPITAL_COMMUNITY)
Admission: RE | Admit: 2013-06-25 | Discharge: 2013-06-25 | Disposition: A | Discharge status: Home / Self Care | Payer: PRIVATE HEALTH INSURANCE | Source: Ambulatory Visit | Attending: Family Medicine | Admitting: Family Medicine

## 2013-06-25 DIAGNOSIS — IMO0001 Reserved for inherently not codable concepts without codable children: Secondary | ICD-10-CM | POA: Insufficient documentation

## 2013-06-25 DIAGNOSIS — M6281 Muscle weakness (generalized): Secondary | ICD-10-CM | POA: Insufficient documentation

## 2013-06-25 DIAGNOSIS — R269 Unspecified abnormalities of gait and mobility: Secondary | ICD-10-CM | POA: Insufficient documentation

## 2013-06-25 DIAGNOSIS — G8191 Hemiplegia, unspecified affecting right dominant side: Secondary | ICD-10-CM

## 2013-06-25 DIAGNOSIS — I69959 Hemiplegia and hemiparesis following unspecified cerebrovascular disease affecting unspecified side: Secondary | ICD-10-CM | POA: Insufficient documentation

## 2013-06-25 DIAGNOSIS — R262 Difficulty in walking, not elsewhere classified: Secondary | ICD-10-CM | POA: Insufficient documentation

## 2013-06-25 NOTE — Evaluation (Signed)
Physical Therapy Evaluation  Patient Details  Name: Brittany Clarke MRN: [id number] Date of Birth: Oct 29, 1940  Today's Date: 06/25/2013 Time: 1345-1420 PT Time Calculation (min): 35 min    Charges 1 evaluation           Visit#: 1 of 10  Re-eval: 07/25/13 Assessment Diagnosis: CVA 2013 , right hemiparesis  Next MD Visit: Dr. Jacelyn Grip  Prior Therapy: no recent home health or outpatient   Authorization: medicare  [employer]ited, [employer]     Authorization Time Period:    Authorization Visit#: 1 of 10   Past Medical History:  Past Medical History  Diagnosis Date  . CHF (congestive heart failure)   . Diabetes mellitus without complication   . COPD (chronic obstructive pulmonary disease)   . Hypertension   . Asthma   . Shortness of breath   . Stroke     2013  . Anxiety   . 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 Pneumonia   . Gout    Past Surgical History:  Past Surgical History  Procedure Laterality Date  . Breast surgery    . Tubal ligation    . Colonoscopy  04/04/2002    SWF:UXNATFTD hemorrhoids, otherwise, normal rectum/Scattered left sided diverticula/Somewhat adenomatous appearing ileocecal valve which is probably normal but this area was biopsied. The remainder of the colonic mucosa appeared   normal  . Trigger finger surgery      twice  . Colonoscopy with esophagogastroduodenoscopy (egd) N/A 02/20/2013    Procedure: COLONOSCOPY WITH ESOPHAGOGASTRODUODENOSCOPY (EGD);  Surgeon: Daneil Dolin, MD;  Location: [facility];  Service: Endoscopy;  Laterality: N/A;    Subjective Symptoms/Limitations Symptoms: patient c/o right side weakness Right UE and right LE due to a stroke in 2013, patient and care taker unaware of month of stroke, medical records indicate 3 month in rehab post stroke , she is with her care taker during the day and friend stays with  her at night  Pertinent History: obesity, using lift chair past 2 years, denies recent theray, recent  hospital stay with discharge 1/20/ 2014, no functional use of right UE , requires 2 person assist at home sit to stand, uses hospital bed  Limitations: Standing;Walking;House hold activities How long can you sit comfortably?: no limits, chair bound without lift or 2 person assist  How long can you stand comfortably?: 2 min with quad cane use   Patient Stated Goals: improve leg strength  Pain Assessment Currently in Pain?: No/denies     Balance Screening Balance Screen Has the patient fallen in the past 6 months: No Has the patient had a decrease in activity level because of a fear of falling? : Yes Is the patient reluctant to leave their home because of a fear of falling? : Yes  Prior Functioning  Home Living Additional Comments: has raised toilet seat, lift chair, uses public transport for medical appointments , 24 hour care at home, patient not left alone, reports uses bed pain at night with assistance , has hospital bed  Prior Function Level of Independence: Needs assistance with tranfers;Needs assistance with gait;Needs assistance with homemaking;Requires assistive device for independence  Able to Take Stairs?: No Driving: No Vocation: Retired  Dealer Overall Cognitive Status: Within Functional Limits for tasks assessed Observation/Other Assessments Observations: presents in wheelchair with care taker, wears sling on Right UE during gait     Assessment RLE Strength Right Hip Flexion: 2-/5 Right Knee Flexion: 2/5  Right Knee Extension: 3/5 Right Ankle Dorsiflexion: 2-/5 LLE Strength Left Hip Flexion: 3/5 Left Knee Flexion: 3/5 Left Knee Extension: 3+/5 Left Ankle Dorsiflexion: 4/5  Exercise/Treatments Mobility/Balance  Bed Mobility Bed Mobility: Not assessed Transfers Transfers: Sit to Stand Sit to Stand: 3: Mod assist of 2  Stand to sit: mod X  X 1  Ambulation/Gait Ambulation/Gait: Yes Ambulation/Gait Assistance:CGA quad cane, step to  gait pattern, short left step , decreased weight shift right  Ambulation Distance (Feet): 30 Feet Assistive device: Small based quad cane Static Sitting Balance Static Sitting - Balance Support: Feet supported;Left upper extremity supported   Static Sitting - Level of Assistance: : Independent  Static Sitting - Comment/# of Minutes: 5 min  Dynamic Sitting Balance Dynamic Sitting - Balance Support: Feet supported Dynamic Sitting - Level of Assistance: 6: Modified independent (Device/Increase time) Reach (Patient is able to reach ___ inches to right, left, forward, back): 5  Static Standing Balance Static Standing - Balance Support: No upper extremity supported Static Standing - Level of Assistance: 4: Min assist Static Standing - Comment/# of Minutes: 1 min       Physical Therapy Assessment and Plan PT Assessment and Plan Clinical Impression Statement: 73 yr old female referred for leg strengthening  and transfer training with hx of stroke 2013 and right hemiplegia. She has notable impairments related to transferring sit to stand as this is mod A x 2 at home and in clinic today. she ambulates short distances in household with quad cane and step to gait pattern and short left stride and step. In standing patient has greater weight  on left LE and nonfunctional use of Right UE , as she keeps it in sling during gait.  Pt will benefit from skilled therapeutic intervention in order to improve on the following deficits: Abnormal gait;Decreased strength;Difficulty walking;Decreased mobility;Obesity Rehab Potential: Fair Clinical Impairments Affecting Rehab Potential: deconditioned, 2 year use of lift chair, obesity, age ,  PT Frequency: Min 2X/week PT Duration:  (5 weeks ) PT Treatment/Interventions: Gait training;Functional mobility training;Therapeutic activities;Therapeutic exercise;Wheelchair mobility training;Patient/family education;Neuromuscular re-education;Modalities PT Plan: sit to  stand transfer training, gait with quad cane, seated leg strengthening, standing balance, dynamic sitting balance     Goals Home Exercise Program Pt/caregiver will Perform Home Exercise Program: For increased strengthening PT Short Term Goals Time to Complete Short Term Goals: 2 weeks PT Short Term Goal 1: patient independently scoot forward in wheelchair and forward weight shift to begin sit to stand transfer to decrease assist needed for sit to stand  PT Short Term Goal 2: patient ambulate in clinic 60 feet x 2 with quad cane CGA for home safety and ambulation  PT Short Term Goal 3: patient stand with contact guarding and reach with L UE safely for objects within 2 to 3 inches for home safety  PT Long Term Goals PT Long Term Goal 1: patient transfers sit to stand mod A X 1 for transfers with care taker at home without using lift chair  PT Long Term Goal 2: patient ambulate in clinic 5 min CGA and quad cane for home mobility   Problem List Patient Active Problem List   Diagnosis Date Noted  . Right hemiplegia 06/25/2013  . Muscle weakness (generalized) 06/25/2013  . Need for prophylactic vaccination and inoculation against influenza 04/16/2013  . 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 Esophageal dysphagia 02/11/2013  . Long term (current) use of anticoagulants 01/24/2013  . Rectal bleeding 01/24/2013  . Chronic  constipation 12/02/2012  . Pedal edema 12/02/2012  . Otalgia of left ear 12/02/2012  . History of rectal bleeding 12/02/2012  . HLD (hyperlipidemia) 12/02/2012  . Dysuria 12/02/2012  . Pain in joint, ankle and foot 09/10/2012  . PNA (pneumonia) 06/10/2012  . Hypokalemia 06/10/2012  . CHF (congestive heart failure) 06/10/2012  . Morbid obesity 06/10/2012  . History of CVA (cerebrovascular accident) 06/10/2012  . Anemia 06/10/2012  . DDD (degenerative disc disease), lumbar 06/10/2012  . Allergic rhinitis 06/10/2012  . Gout 06/10/2012  . HTN (hypertension)  06/10/2012  . DM (diabetes mellitus) 06/10/2012  . Constipation 06/10/2012  . Urinary incontinence 06/10/2012  . History of breast cancer 06/10/2012  . COPD (chronic obstructive pulmonary disease) 06/10/2012    PT - End of Session Equipment Utilized During Treatment: Gait belt Activity Tolerance: Patient tolerated treatment well PT Plan of Care Consulted and Agree with Plan of Care: Patient;Family member/caregiver  GP Functional Assessment Tool Used: limted household ambulation with assist and cane /professional/judgement  Functional Limitation: Mobility: Walking and moving around Mobility: Walking and Moving Around Current Status [phone]): At least 80 percent but less than 100 percent impaired, limited or restricted Mobility: Walking and Moving Around Goal Status [phone]): At least 60 percent but less than 80 percent impaired, limited or restricted  Kilynn Fitzsimmons 06/25/2013, 5:11 PM  Physician Documentation Your signature is required to indicate approval of the treatment plan as stated above.  Please sign and either send electronically or make a copy of this report for your files and return this physician signed original.   Please mark one 1.__approve of plan  2. ___approve of plan with the following conditions.   ______________________________                                                          _____________________ Physician Signature                                                                                                             Date

## 2013-06-26 ENCOUNTER — Telehealth: Payer: Self-pay | Admitting: Family Medicine

## 2013-06-26 NOTE — Telephone Encounter (Signed)
They should check with PT if they think it would help considering what they are doing for her. And if they think it would help then they can request an order from me. Caelan Atchley P. Jacelyn Grip, M.D.

## 2013-06-26 NOTE — Telephone Encounter (Signed)
SPOKE WITH PT AND ALSO LEFT MESSAGE ON HEATHER VM TO CALL IF NEEDS ORDER FOR OT

## 2013-06-30 ENCOUNTER — Other Ambulatory Visit: Payer: Self-pay | Admitting: Family Medicine

## 2013-06-30 ENCOUNTER — Telehealth: Payer: Self-pay | Admitting: Family Medicine

## 2013-06-30 DIAGNOSIS — E11610 Type 2 diabetes mellitus with diabetic neuropathic arthropathy: Secondary | ICD-10-CM

## 2013-06-30 MED ORDER — HYDROCODONE-ACETAMINOPHEN 7.5-325 MG PO TABS: 1.0000 | ORAL_TABLET | Freq: Every day | ORAL | Status: DC

## 2013-06-30 NOTE — Telephone Encounter (Signed)
Rx ready for pick up. 

## 2013-06-30 NOTE — Telephone Encounter (Signed)
Patient aware.

## 2013-07-02 ENCOUNTER — Ambulatory Visit (HOSPITAL_COMMUNITY)
Admission: RE | Admit: 2013-07-02 | Discharge: 2013-07-02 | Disposition: A | Discharge status: Home / Self Care | Payer: PRIVATE HEALTH INSURANCE | Source: Ambulatory Visit | Attending: Family Medicine | Admitting: Family Medicine

## 2013-07-02 DIAGNOSIS — G8191 Hemiplegia, unspecified affecting right dominant side: Secondary | ICD-10-CM

## 2013-07-02 DIAGNOSIS — M6281 Muscle weakness (generalized): Secondary | ICD-10-CM

## 2013-07-02 NOTE — Progress Notes (Signed)
Physical Therapy Treatment Patient Details  Name: Brittany Clarke MRN: [id number] Date of Birth: 1940-07-07  Today's Date: 07/02/2013 Time: 4098-1191 PT Time Calculation (min): 40 min Charge: TA [id number]  Visit#: 2 of 10  Re-eval: 07/25/13    Authorization: medicare  united, [employer]   Authorization Time Period:    Authorization Visit#: 2 of 10   Subjective: Symptoms/Limitations Symptoms: Pt reoprted increased Rt LE pain scale 9/10.  Reports walking with quad cane multiple times a day. Pain Assessment Currently in Pain?: Yes Pain Score: 9  Pain Location: Leg Pain Orientation: Right  Objective:   Exercise/Treatments Mobility/Balance  Transfers Transfers: Sit to Stand Sit to Stand: 3: Mod assist Sit to Stand Details (indicate cue type and reason): cueing for scooting to EOC and for Lt Handplacement assistance Ambulation/Gait Ambulation/Gait: Yes Ambulation/Gait Assistance: 5: Supervision Ambulation Distance (Feet): 72 Feet Assistive device: Small based quad cane     Standing Gait Training: 37' with SBA with QC, cueing for posture and sequencing Other Standing Knee Exercises: Static standing x 2 minutes Other Standing Knee Exercises: 5 Sit to stand with Lt HHA Seated Long Arc Quad: Both;10 reps Other Seated Knee Exercises: heel and toe raises 10x Bil LE Other Seated Knee Exercises: Seated balance training on dyna disc including TrA activaitn with sitting tall 10x, Lt UE flexion 10x, Cone rotatin with Lt UE R/L and reaching forward outside  BOS     Physical Therapy Assessment and Plan PT Assessment and Plan Clinical Impression Statement: Began POC to improved functional mobility with transfer and gait training.  Dynamic seated balance activities complete for core strengthening to improve seated balance.  Gait trainng with QC, min guard with cueing for posture and sequencing with SPC.  Pt limited by fatgiue at end of session. PT Plan: Continue with sit to stand  transfer training, gait with quad cane, seated leg strengthening, standing balance, dynamic sitting balance     Goals Home Exercise Program Pt/caregiver will Perform Home Exercise Program: For increased strengthening PT Short Term Goals Time to Complete Short Term Goals: 2 weeks PT Short Term Goal 1: patient independently scoot forward in wheelchair and forward weight shift to begin sit to stand transfer to decrease assist needed for sit to stand  PT Short Term Goal 1 - Progress: Progressing toward goal PT Short Term Goal 2: patient ambulate in clinic 60 feet x 2 with quad cane CGA for home safety and ambulation  PT Short Term Goal 2 - Progress: Progressing toward goal PT Short Term Goal 3: patient stand with contact guarding and reach with L UE safely for objects within 2 to 3 inches for home safety  PT Long Term Goals PT Long Term Goal 1: patient transfers sit to stand mod A X 1 for transfers with care taker at home without using lift chair  PT Long Term Goal 1 - Progress: Progressing toward goal PT Long Term Goal 2: patient ambulate in clinic 5 min CGA and quad cane for home mobility   Problem List Patient Active Problem List   Diagnosis Date Noted  . Right hemiplegia 06/25/2013  . Muscle weakness (generalized) 06/25/2013  . Need for prophylactic vaccination and inoculation against influenza 04/16/2013  . 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 Esophageal dysphagia 02/11/2013  . Long term (current) use of anticoagulants 01/24/2013  . Rectal bleeding 01/24/2013  . Chronic constipation 12/02/2012  . Pedal edema 12/02/2012  . Otalgia of left ear 12/02/2012  . History of rectal bleeding  12/02/2012  . HLD (hyperlipidemia) 12/02/2012  . Dysuria 12/02/2012  . Pain in joint, ankle and foot 09/10/2012  . PNA (pneumonia) 06/10/2012  . Hypokalemia 06/10/2012  . CHF (congestive heart failure) 06/10/2012  . Morbid obesity 06/10/2012  . History of CVA (cerebrovascular accident)  06/10/2012  . Anemia 06/10/2012  . DDD (degenerative disc disease), lumbar 06/10/2012  . Allergic rhinitis 06/10/2012  . Gout 06/10/2012  . HTN (hypertension) 06/10/2012  . DM (diabetes mellitus) 06/10/2012  . Constipation 06/10/2012  . Urinary incontinence 06/10/2012  . History of breast cancer 06/10/2012  . COPD (chronic obstructive pulmonary disease) 06/10/2012    PT - End of Session Equipment Utilized During Treatment: Gait belt Activity Tolerance: Patient limited by fatigue;Patient tolerated treatment well  GP    Aldona Lento 07/02/2013, 3:39 PM

## 2013-07-04 ENCOUNTER — Inpatient Hospital Stay (HOSPITAL_COMMUNITY): Admission: RE | Admit: 2013-07-04 | Payer: PRIVATE HEALTH INSURANCE | Source: Ambulatory Visit

## 2013-07-08 ENCOUNTER — Ambulatory Visit (HOSPITAL_COMMUNITY): Payer: PRIVATE HEALTH INSURANCE

## 2013-07-11 ENCOUNTER — Inpatient Hospital Stay (HOSPITAL_COMMUNITY): Admission: RE | Admit: 2013-07-11 | Payer: PRIVATE HEALTH INSURANCE | Source: Ambulatory Visit

## 2013-07-15 ENCOUNTER — Ambulatory Visit (HOSPITAL_COMMUNITY): Payer: PRIVATE HEALTH INSURANCE | Admitting: Physical Therapy

## 2013-07-17 ENCOUNTER — Ambulatory Visit (HOSPITAL_COMMUNITY): Payer: PRIVATE HEALTH INSURANCE | Admitting: Physical Therapy

## 2013-07-18 ENCOUNTER — Ambulatory Visit (INDEPENDENT_AMBULATORY_CARE_PROVIDER_SITE_OTHER): Payer: Medicare Other | Admitting: Family Medicine

## 2013-07-18 ENCOUNTER — Encounter: Payer: Self-pay | Admitting: Family Medicine

## 2013-07-18 VITALS — BP 125/83 | HR 93 | Temp 98.8°F | Wt 220.0 lb

## 2013-07-18 DIAGNOSIS — Z7901 Long term (current) use of anticoagulants: Secondary | ICD-10-CM

## 2013-07-18 DIAGNOSIS — Z853 Personal history of malignant neoplasm of breast: Secondary | ICD-10-CM

## 2013-07-18 DIAGNOSIS — I10 Essential (primary) hypertension: Secondary | ICD-10-CM

## 2013-07-18 DIAGNOSIS — E119 Type 2 diabetes mellitus without complications: Secondary | ICD-10-CM

## 2013-07-18 DIAGNOSIS — R609 Edema, unspecified: Secondary | ICD-10-CM

## 2013-07-18 DIAGNOSIS — G8190 Hemiplegia, unspecified affecting unspecified side: Secondary | ICD-10-CM

## 2013-07-18 DIAGNOSIS — R600 Localized edema: Secondary | ICD-10-CM

## 2013-07-18 DIAGNOSIS — M25579 Pain in unspecified ankle and joints of unspecified foot: Secondary | ICD-10-CM

## 2013-07-18 DIAGNOSIS — I6350 Cerebral infarction due to unspecified occlusion or stenosis of unspecified cerebral artery: Secondary | ICD-10-CM

## 2013-07-18 DIAGNOSIS — E1149 Type 2 diabetes mellitus with other diabetic neurological complication: Secondary | ICD-10-CM

## 2013-07-18 DIAGNOSIS — J449 Chronic obstructive pulmonary disease, unspecified: Secondary | ICD-10-CM

## 2013-07-18 DIAGNOSIS — G8191 Hemiplegia, unspecified affecting right dominant side: Secondary | ICD-10-CM

## 2013-07-18 DIAGNOSIS — Z8673 Personal history of transient ischemic attack (TIA), and cerebral infarction without residual deficits: Secondary | ICD-10-CM

## 2013-07-18 DIAGNOSIS — E11610 Type 2 diabetes mellitus with diabetic neuropathic arthropathy: Secondary | ICD-10-CM

## 2013-07-18 DIAGNOSIS — E785 Hyperlipidemia, unspecified: Secondary | ICD-10-CM

## 2013-07-18 DIAGNOSIS — I639 Cerebral infarction, unspecified: Secondary | ICD-10-CM

## 2013-07-18 LAB — POCT INR: INR: 1.9

## 2013-07-18 MED ORDER — HYDROCODONE-ACETAMINOPHEN 7.5-325 MG PO TABS: 1.0000 | ORAL_TABLET | Freq: Two times a day (BID) | ORAL | Status: DC | PRN

## 2013-07-18 NOTE — Progress Notes (Signed)
Patient ID: Brittany Clarke, female   DOB: December 26, 1940, 73 y.o.   MRN: [id number] SUBJECTIVE: CC: Chief Complaint  Patient presents with  . Follow-up    3 month ck up and needs protime checked    HPI: Here for follow up on protime. Medication was adjusted for the INR 4.1 All other medical problems  Stable.  Patient is here for follow up of Diabetes Mellitus/obesity/HTN/: Symptoms evaluated: Denies Nocturia ,Denies Urinary Frequency , denies Blurred vision ,deniesDizziness,denies.Dysuria,denies paresthesias, denies extremity pain or ulcers.Marland Kitchendenies chest pain. has had an annual eye exam. do check the feet. Does check CBGs. Average [id number] Denies episodes of hypoglycemia. Does have an emergency hypoglycemic plan. admits toCompliance with medications. Denies Problems with medications.  Has a lot of pains in the right knee and in the joints and it is unbearable especially with the cold weather  Past Medical History  Diagnosis Date  . CHF (congestive heart failure)   . Diabetes mellitus without complication   . COPD (chronic obstructive pulmonary disease)   . Hypertension   . Asthma   . Shortness of breath   . Stroke     2013  . Anxiety   . 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 Pneumonia   . Gout    Past Surgical History  Procedure Laterality Date  . Breast surgery    . Tubal ligation    . Colonoscopy  04/04/2002    [id number] hemorrhoids, otherwise, normal rectum/Scattered left sided diverticula/Somewhat adenomatous appearing ileocecal valve which is probably normal but this area was biopsied. The remainder of the colonic mucosa appeared   normal  . Trigger finger surgery      twice  . Colonoscopy with esophagogastroduodenoscopy (egd) N/A 02/20/2013    Procedure: COLONOSCOPY WITH ESOPHAGOGASTRODUODENOSCOPY (EGD);  Surgeon: Daneil Dolin, MD;  Location: [facility];  Service: Endoscopy;  Laterality: N/A;   History   Social History  .  Marital Status: Legally Separated    Spouse Name: N/A    Number of Children: 5  . Years of Education: N/A   Occupational History  . Not on file.   Social History Main Topics  . Smoking status: Never Smoker   . Smokeless tobacco: Not on file  . Alcohol Use: No  . Drug Use: No  . Sexual Activity:    Other Topics Concern  . Not on file   Social History Narrative  . No narrative on file   Family History  Problem Relation Age of Onset  . Colon cancer Neg Hx   . Liver disease Neg Hx    Current Outpatient Prescriptions on File Prior to Visit  Medication Sig Dispense Refill  . acetaminophen (TYLENOL) 500 MG tablet Take 500 mg by mouth every 6 (six) hours as needed. For pain      . allopurinol (ZYLOPRIM) 300 MG tablet Take 300 mg by mouth every morning.      Marland Kitchen atorvastatin (LIPITOR) 40 MG tablet Take 1 tablet (40 mg total) by mouth daily.  30 tablet  3  . baclofen (LIORESAL) 10 MG tablet Take 10 mg by mouth 3 (three) times daily.      . feeding supplement (GLUCERNA SHAKE) LIQD Take 237 mLs by mouth 3 (three) times daily between meals.  60 Can  1  . furosemide (LASIX) 20 MG tablet Take 20 mg by mouth daily as needed for fluid.      Marland Kitchen gabapentin (NEURONTIN) 400 MG capsule  TAKE ONE CAPSULE BY MOUTH ONCE DAILY IN THE EVENING  30 capsule  1  . Lancets (ACCU-CHEK SOFT TOUCH) lancets Use as instructed  100 each  12  . loratadine (CLARITIN) 10 MG tablet Take 10 mg by mouth daily.      Marland Kitchen losartan (COZAAR) 25 MG tablet Take 25 mg by mouth daily.      . metFORMIN (GLUCOPHAGE) 500 MG tablet Take 500 mg by mouth 2 (two) times daily with a meal.      . metoprolol tartrate (LOPRESSOR) 25 MG tablet TAKE ONE-HALF TABLET BY MOUTH TWICE DAILY  30 tablet  4  . montelukast (SINGULAIR) 10 MG tablet Take 10 mg by mouth at bedtime.      Marland Kitchen neomycin-polymyxin-hydrocortisone (CORTISPORIN) 3.5-10000-1 otic suspension Place 3 drops into the left ear daily as needed (Ear Pain).      . polyethylene glycol (MIRALAX  / GLYCOLAX) packet Take 17 g by mouth daily.  14 each  2  . warfarin (COUMADIN) 5 MG tablet Take 1/2 on Mondays , Wednesdays and Fridays. 1 tab on all the other days  or as directed by anticoagulation clinic       No current facility-administered medications on file prior to visit.   Allergies  Allergen Reactions  . Ultram [Tramadol] Shortness Of Breath   Immunization History  Administered Date(s) Administered  . Influenza,inj,Quad PF,36+ Mos 04/16/2013   Prior to Admission medications   Medication Sig Start Date End Date Taking? Authorizing Provider  acetaminophen (TYLENOL) 500 MG tablet Take 500 mg by mouth every 6 (six) hours as needed. For pain    Historical Provider, MD  allopurinol (ZYLOPRIM) 300 MG tablet Take 300 mg by mouth every morning.    Historical Provider, MD  atorvastatin (LIPITOR) 40 MG tablet Take 1 tablet (40 mg total) by mouth daily. 06/06/13   Vernie Shanks, MD  baclofen (LIORESAL) 10 MG tablet Take 10 mg by mouth 3 (three) times daily.    Historical Provider, MD  feeding supplement (GLUCERNA SHAKE) LIQD Take 237 mLs by mouth 3 (three) times daily between meals. 06/12/12   Bobby Rumpf [place], PA-C  furosemide (LASIX) 20 MG tablet Take 20 mg by mouth daily as needed for fluid. 12/02/12   Vernie Shanks, MD  gabapentin (NEURONTIN) 400 MG capsule TAKE ONE CAPSULE BY MOUTH ONCE DAILY IN THE EVENING 05/23/13   Vernie Shanks, MD  HYDROcodone-acetaminophen (NORCO) 7.5-325 MG per tablet Take 1 tablet by mouth at bedtime. 06/30/13   Vernie Shanks, MD  Lancets (ACCU-CHEK SOFT TOUCH) lancets Use as instructed 05/14/13   Vernie Shanks, MD  levofloxacin (LEVAQUIN) 500 MG tablet Take 1 tablet (500 mg total) by mouth daily. 05/30/13   Lysbeth Penner, FNP  loratadine (CLARITIN) 10 MG tablet Take 10 mg by mouth daily.    Historical Provider, MD  losartan (COZAAR) 25 MG tablet Take 25 mg by mouth daily.    Historical Provider, MD  metFORMIN (GLUCOPHAGE) 500 MG tablet Take 500 mg by mouth 2  (two) times daily with a meal.    Historical Provider, MD  metoprolol tartrate (LOPRESSOR) 25 MG tablet TAKE ONE-HALF TABLET BY MOUTH TWICE DAILY 05/10/13   Vernie Shanks, MD  montelukast (SINGULAIR) 10 MG tablet Take 10 mg by mouth at bedtime.    Historical Provider, MD  neomycin-polymyxin-hydrocortisone (CORTISPORIN) 3.5-10000-1 otic suspension Place 3 drops into the left ear daily as needed (Ear Pain). 12/02/12   Vernie Shanks, MD  polyethylene  glycol (MIRALAX / GLYCOLAX) packet Take 17 g by mouth daily. 12/02/12   Vernie Shanks, MD  warfarin (COUMADIN) 5 MG tablet Take 1daily  or as directed by anticoagulation clinic 02/24/13   Tammy Eckard, PHARMD     ROS: As above in the HPI. All other systems are stable or negative.  OBJECTIVE: APPEARANCE:  Patient in no acute distress.The patient appeared well nourished and normally developed. Acyanotic. Waist: VITAL SIGNS:BP 125/83  Pulse 93  Temp(Src) 98.8 F (37.1 C) (Oral)  Wt 220 lb (99.791 kg)  SpO2 98%  Obese AAF in a wheelchair.  SKIN: warm and  Dry without overt rashes, tattoos and scars  HEAD and Neck: without JVD, Head and scalp: normal Eyes:No scleral icterus. Fundi normal, eye movements normal. Ears: Auricle normal, canal normal, Tympanic membranes normal, insufflation normal. Nose: normal Throat: normal Neck & thyroid: normal  CHEST & LUNGS: Chest wall: normal Lungs: Clear  CVS: Reveals the PMI to be normally located. Regular rhythm, First and Second Heart sounds are normal,  absence of murmurs, rubs or gallops. Peripheral vasculature: Radial pulses: normal Dorsal pedis pulses: normal Posterior pulses: normal  ABDOMEN:  Appearance: Obese Benign, no organomegaly, no masses, no Abdominal Aortic enlargement. No Guarding , no rebound. No Bruits. Bowel sounds: normal  RECTAL: N/A GU: N/A  EXTREMETIES: nonedematous.  MUSCULOSKELETAL:  Spine: normal Joints: intact  NEUROLOGIC: oriented to time,place and  person;  Right hemiplegia  Results for orders placed in visit on 07/18/13  POCT INR      Result Value Ref Range   INR 1.9      ASSESSMENT:   CVA (cerebral vascular accident) - Plan: POCT INR  Charcot foot due to diabetes mellitus - Plan: HYDROcodone-acetaminophen ([place]) 7.5-325 MG per tablet  Pain in joint, ankle and foot  Morbid obesity  History of CVA (cerebrovascular accident)  Long term (current) use of anticoagulants  DM (diabetes mellitus)  Right hemiplegia  HLD (hyperlipidemia)  HTN (hypertension)  COPD (chronic obstructive pulmonary disease)  History of breast cancer  Pedal edema With her uncontrolled pain and anticoagulation management and risks for bleeding, will increase the analgesics.  PLAN:  Hold off on adjusting coumadin due to increased analgesics today Results for orders placed in visit on 07/18/13  POCT INR      Result Value Ref Range   INR 1.9       No orders of the defined types were placed in this encounter.   Meds ordered this encounter  Medications  . HYDROcodone-acetaminophen (NORCO) 7.5-325 MG per tablet    Sig: Take 1 tablet by mouth 2 (two) times daily as needed for moderate pain.    Dispense:  60 tablet    Refill:  0   Medications Discontinued During This Encounter  Medication Reason  . levofloxacin (LEVAQUIN) 500 MG tablet Completed Course  . HYDROcodone-acetaminophen (NORCO) 7.5-325 MG per tablet Reorder   Return in about 1 week (around 07/25/2013) for recheck protime, Recheck medical problems.labs to assess DM , etc.   Bernhard Koskinen P. Jacelyn Grip, M.D.

## 2013-07-21 ENCOUNTER — Ambulatory Visit (HOSPITAL_COMMUNITY): Payer: PRIVATE HEALTH INSURANCE | Admitting: Physical Therapy

## 2013-07-23 ENCOUNTER — Ambulatory Visit (HOSPITAL_COMMUNITY)
Admission: RE | Admit: 2013-07-23 | Discharge: 2013-07-23 | Disposition: A | Discharge status: Home / Self Care | Payer: PRIVATE HEALTH INSURANCE | Source: Ambulatory Visit | Attending: Family Medicine | Admitting: Family Medicine

## 2013-07-23 DIAGNOSIS — G8191 Hemiplegia, unspecified affecting right dominant side: Secondary | ICD-10-CM

## 2013-07-23 DIAGNOSIS — M6281 Muscle weakness (generalized): Secondary | ICD-10-CM | POA: Insufficient documentation

## 2013-07-23 DIAGNOSIS — R269 Unspecified abnormalities of gait and mobility: Secondary | ICD-10-CM | POA: Insufficient documentation

## 2013-07-23 DIAGNOSIS — R262 Difficulty in walking, not elsewhere classified: Secondary | ICD-10-CM | POA: Insufficient documentation

## 2013-07-23 DIAGNOSIS — IMO0001 Reserved for inherently not codable concepts without codable children: Secondary | ICD-10-CM | POA: Insufficient documentation

## 2013-07-23 DIAGNOSIS — I69959 Hemiplegia and hemiparesis following unspecified cerebrovascular disease affecting unspecified side: Secondary | ICD-10-CM | POA: Insufficient documentation

## 2013-07-23 NOTE — Evaluation (Signed)
Physical Therapy Progress Note   Patient Details  Name: Brittany Clarke MRN: [id number] Date of Birth: 01-20-1941  Today's Date: 07/23/2013 Time: 1345-1430 PT Time Calculation (min): 45 min  TE x 40 min              Visit#: 3 of 8  Re-eval: 08/22/13 Assessment Diagnosis: CVA 2013 , right hemiparesis  Next MD Visit: Dr. Jacelyn Grip  Prior Therapy: no recent home health or outpatient   Authorization: medicare  united, [employer]     Authorization Time Period:    Authorization Visit#: 3 of 10   Past Medical History:  Past Medical History  Diagnosis Date  . CHF (congestive heart failure)   . Diabetes mellitus without complication   . COPD (chronic obstructive pulmonary disease)   . Hypertension   . Asthma   . Shortness of breath   . Stroke     2013  . Anxiety   . 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 Pneumonia   . Gout    Past Surgical History:  Past Surgical History  Procedure Laterality Date  . Breast surgery    . Tubal ligation    . Colonoscopy  04/04/2002    [id number] hemorrhoids, otherwise, normal rectum/Scattered left sided diverticula/Somewhat adenomatous appearing ileocecal valve which is probably normal but this area was biopsied. The remainder of the colonic mucosa appeared   normal  . Trigger finger surgery      twice  . Colonoscopy with esophagogastroduodenoscopy (egd) N/A 02/20/2013    Procedure: COLONOSCOPY WITH ESOPHAGOGASTRODUODENOSCOPY (EGD);  Surgeon: Daneil Dolin, MD;  Location: [facility];  Service: Endoscopy;  Laterality: N/A;    Subjective Symptoms/Limitations Symptoms: 5/10 right knee pain , many visits missed per weather , care taker states if appt are before 1  they can continue rehab , offered idea of home care to patient and caretaker but stated they  could continue outpatient therapy  Pertinent History: obesity, using lift chair past 2 years, denies recent theray, recent hospital stay with discharge 1/20/ 2014, no  functional use of right UE , requires 2 person assist at home sit to stand, uses hospital bed  Patient Stated Goals: improve leg strength    Assessment RLE Strength Right Hip Flexion: 2-/5 Right Knee Flexion: 2/5 Right Knee Extension: 3/5 Right Ankle Dorsiflexion: 2-/5 LLE Strength Left Hip Flexion: 3/5 Left Knee Flexion: 3/5 Left Knee Extension: 3+/5 Left Ankle Dorsiflexion: 4/5 Strength testing same as initial  Exercise/Treatments Mobility/Balance  Transfers Transfers: Sit to Stand Sit to Stand: 3: Mod assist Ambulation/Gait Ambulation/Gait: Yes Ambulation/Gait Assistance: 5: Supervision Ambulation Distance (Feet): 75 Feet Assistive device: Small based quad cane   S  Standing Gait Training: 51' with SBA with QC, cueing for posture and sequencing Other Standing Knee Exercises: Static standing x 2 minutes, static stand 1 min with narrow base of support  Other Standing Knee Exercises: sit to stand 6 X with left HHA  Seated Long Arc Quad: Both;10 reps Other Seated Knee Exercises: heel and toe raises 10x Bil LE Other Seated Knee Exercises: seated hamstring isometrics 10x, seated ball adductor squeeze 10x, seated reach for cones 3 min, seated resistive lumbar extension 10x from flexed postition      Physical Therapy Assessment and Plan PT Assessment and Plan Clinical Impression Statement: patient only attentded 2 visits since start of care 06/25/13 , cancellation per weather, improving her walking tolerance and sit to stand transfers, can benefit from  continued care for improved transfers and leg strength for right hemiparesis  Rehab Potential: Good Clinical Impairments Affecting Rehab Potential: deconditioned, 2 year use of lift chair, obesity, age ,  PT Plan: continue 2 wee k for 2 weeks and reacess then, also offered thought of home care per cancellations due to weather and transport, patient elected outpatient contiuation at this time, will continue with strengthening  program     Goals PT Short Term Goals Time to Complete Short Term Goals: 2 weeks PT Short Term Goal 1: patient independently scoot forward in wheelchair and forward weight shift to begin sit to stand transfer to decrease assist needed for sit to stand  PT Short Term Goal 1 - Progress: Met PT Short Term Goal 2: patient ambulate in clinic 60 feet x 2 with quad cane CGA for home safety and ambulation  PT Short Term Goal 2 - Progress: Progressing toward goal PT Short Term Goal 3: patient stand with contact guarding and reach with L UE safely for objects within 2 to 3 inches for home safety  PT Short Term Goal 3 - Progress: Progressing toward goal PT Long Term Goals PT Long Term Goal 1: patient transfers sit to stand mod A X 1 for transfers with care taker at home without using lift chair  PT Long Term Goal 1 - Progress: Progressing toward goal PT Long Term Goal 2: patient ambulate in clinic 5 min CGA and quad cane for home mobility  PT Long Term Goal 2 - Progress: Progressing toward goal  Problem List Patient Active Problem List   Diagnosis Date Noted  . Right hemiplegia 06/25/2013  . Muscle weakness (generalized) 06/25/2013  . Need for prophylactic vaccination and inoculation against influenza 04/16/2013  . 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 Esophageal dysphagia 02/11/2013  . Long term (current) use of anticoagulants 01/24/2013  . Rectal bleeding 01/24/2013  . Chronic constipation 12/02/2012  . Pedal edema 12/02/2012  . Otalgia of left ear 12/02/2012  . History of rectal bleeding 12/02/2012  . HLD (hyperlipidemia) 12/02/2012  . Dysuria 12/02/2012  . Pain in joint, ankle and foot 09/10/2012  . PNA (pneumonia) 06/10/2012  . Hypokalemia 06/10/2012  . CHF (congestive heart failure) 06/10/2012  . Morbid obesity 06/10/2012  . History of CVA (cerebrovascular accident) 06/10/2012  . Anemia 06/10/2012  . DDD (degenerative disc disease), lumbar 06/10/2012  . Allergic  rhinitis 06/10/2012  . Gout 06/10/2012  . HTN (hypertension) 06/10/2012  . DM (diabetes mellitus) 06/10/2012  . Constipation 06/10/2012  . Urinary incontinence 06/10/2012  . History of breast cancer 06/10/2012  . COPD (chronic obstructive pulmonary disease) 06/10/2012    PT - End of Session Equipment Utilized During Treatment: Gait belt Activity Tolerance: Patient tolerated treatment well PT Plan of Care Consulted and Agree with Plan of Care: Patient;Family member/caregiver  GP    Tomislav Micale 07/23/2013, 4:35 PM  Physician Documentation Your signature is required to indicate approval of the treatment plan as stated above.  Please sign and either send electronically or make a copy of this report for your files and return this physician signed original.   Please mark one 1.__approve of plan  2. ___approve of plan with the following conditions.   ______________________________  _____________________ Physician Signature                                                                                                             Date

## 2013-07-28 ENCOUNTER — Ambulatory Visit (HOSPITAL_COMMUNITY): Payer: PRIVATE HEALTH INSURANCE | Admitting: Physical Therapy

## 2013-07-28 ENCOUNTER — Other Ambulatory Visit: Payer: Self-pay | Admitting: Family Medicine

## 2013-07-29 ENCOUNTER — Encounter: Payer: Self-pay | Admitting: Family Medicine

## 2013-07-29 ENCOUNTER — Ambulatory Visit (INDEPENDENT_AMBULATORY_CARE_PROVIDER_SITE_OTHER): Payer: Medicare Other | Admitting: Family Medicine

## 2013-07-29 VITALS — BP 129/88 | HR 88 | Temp 99.2°F | Wt 220.0 lb

## 2013-07-29 DIAGNOSIS — I6350 Cerebral infarction due to unspecified occlusion or stenosis of unspecified cerebral artery: Secondary | ICD-10-CM

## 2013-07-29 DIAGNOSIS — I10 Essential (primary) hypertension: Secondary | ICD-10-CM

## 2013-07-29 DIAGNOSIS — E1149 Type 2 diabetes mellitus with other diabetic neurological complication: Secondary | ICD-10-CM

## 2013-07-29 DIAGNOSIS — J449 Chronic obstructive pulmonary disease, unspecified: Secondary | ICD-10-CM

## 2013-07-29 DIAGNOSIS — E119 Type 2 diabetes mellitus without complications: Secondary | ICD-10-CM

## 2013-07-29 DIAGNOSIS — I639 Cerebral infarction, unspecified: Secondary | ICD-10-CM

## 2013-07-29 DIAGNOSIS — E785 Hyperlipidemia, unspecified: Secondary | ICD-10-CM

## 2013-07-29 DIAGNOSIS — Z8673 Personal history of transient ischemic attack (TIA), and cerebral infarction without residual deficits: Secondary | ICD-10-CM

## 2013-07-29 DIAGNOSIS — E11610 Type 2 diabetes mellitus with diabetic neuropathic arthropathy: Secondary | ICD-10-CM

## 2013-07-29 DIAGNOSIS — R609 Edema, unspecified: Secondary | ICD-10-CM

## 2013-07-29 DIAGNOSIS — Z7901 Long term (current) use of anticoagulants: Secondary | ICD-10-CM

## 2013-07-29 DIAGNOSIS — R600 Localized edema: Secondary | ICD-10-CM

## 2013-07-29 DIAGNOSIS — M109 Gout, unspecified: Secondary | ICD-10-CM

## 2013-07-29 LAB — POCT GLYCOSYLATED HEMOGLOBIN (HGB A1C): Hemoglobin A1C: 6

## 2013-07-29 LAB — POCT INR: INR: 2.2

## 2013-07-29 NOTE — Telephone Encounter (Signed)
Last seen 07/29/13  FPW  No Vit D level in EPIC

## 2013-07-29 NOTE — Progress Notes (Signed)
Patient ID: Brittany Clarke, female   DOB: June 17, 1940, 73 y.o.   MRN: [id number] SUBJECTIVE: CC: Chief Complaint  Patient presents with  . Follow-up    1 week re ck labs protime    HPI: Patient is here for follow up of Diabetes Mellitus/CHF/CVA/anticoag/Obesity/: Symptoms evaluated: Denies Nocturia ,Denies Urinary Frequency , denies Blurred vision ,deniesDizziness,denies.Dysuria,denies paresthesias, denies extremity pain or ulcers.Marland Kitchendenies chest pain. has had an annual eye exam. do check the feet. Does check CBGs. Average KPT:WSFKC okay 110-120 Denies episodes of hypoglycemia. Does have an emergency hypoglycemic plan. admits toCompliance with medications. Denies Problems with medications.    Anti-coag: had her Protime done.  Pain is better controlled. Past Medical History  Diagnosis Date  . CHF (congestive heart failure)   . Diabetes mellitus without complication   . COPD (chronic obstructive pulmonary disease)   . Hypertension   . Asthma   . Shortness of breath   . Stroke     2013  . Anxiety   . 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 Pneumonia   . Gout    Past Surgical History  Procedure Laterality Date  . Breast surgery    . Tubal ligation    . Colonoscopy  04/04/2002    LEX:NTZGYFVC hemorrhoids, otherwise, normal rectum/Scattered left sided diverticula/Somewhat adenomatous appearing ileocecal valve which is probably normal but this area was biopsied. The remainder of the colonic mucosa appeared   normal  . Trigger finger surgery      twice  . Colonoscopy with esophagogastroduodenoscopy (egd) N/A 02/20/2013    Procedure: COLONOSCOPY WITH ESOPHAGOGASTRODUODENOSCOPY (EGD);  Surgeon: Daneil Dolin, MD;  Location: [facility];  Service: Endoscopy;  Laterality: N/A;   History   Social History  . Marital Status: Legally Separated    Spouse Name: N/A    Number of Children: 5  . Years of Education: N/A   Occupational History  . Not on  file.   Social History Main Topics  . Smoking status: Never Smoker   . Smokeless tobacco: Not on file  . Alcohol Use: No  . Drug Use: No  . Sexual Activity:    Other Topics Concern  . Not on file   Social History Narrative  . No narrative on file   Family History  Problem Relation Age of Onset  . Colon cancer Neg Hx   . Liver disease Neg Hx    Current Outpatient Prescriptions on File Prior to Visit  Medication Sig Dispense Refill  . acetaminophen (TYLENOL) 500 MG tablet Take 500 mg by mouth every 6 (six) hours as needed. For pain      . allopurinol (ZYLOPRIM) 300 MG tablet Take 300 mg by mouth every morning.      Marland Kitchen atorvastatin (LIPITOR) 40 MG tablet Take 1 tablet (40 mg total) by mouth daily.  30 tablet  3  . baclofen (LIORESAL) 10 MG tablet Take 10 mg by mouth 3 (three) times daily.      . feeding supplement (GLUCERNA SHAKE) LIQD Take 237 mLs by mouth 3 (three) times daily between meals.  60 Can  1  . furosemide (LASIX) 20 MG tablet Take 20 mg by mouth daily as needed for fluid.      Marland Kitchen gabapentin (NEURONTIN) 400 MG capsule TAKE ONE CAPSULE BY MOUTH ONCE DAILY IN THE EVENING  30 capsule  1  . HYDROcodone-acetaminophen (NORCO) 7.5-325 MG per tablet Take 1 tablet by mouth 2 (two) times  daily as needed for moderate pain.  60 tablet  0  . Lancets (ACCU-CHEK SOFT TOUCH) lancets Use as instructed  100 each  12  . loratadine (CLARITIN) 10 MG tablet Take 10 mg by mouth daily.      Marland Kitchen losartan (COZAAR) 25 MG tablet Take 25 mg by mouth daily.      . metFORMIN (GLUCOPHAGE) 500 MG tablet Take 500 mg by mouth 2 (two) times daily with a meal.      . metoprolol tartrate (LOPRESSOR) 25 MG tablet TAKE ONE-HALF TABLET BY MOUTH TWICE DAILY  30 tablet  4  . montelukast (SINGULAIR) 10 MG tablet Take 10 mg by mouth at bedtime.      Marland Kitchen neomycin-polymyxin-hydrocortisone (CORTISPORIN) 3.5-10000-1 otic suspension Place 3 drops into the left ear daily as needed (Ear Pain).      . polyethylene glycol  (MIRALAX / GLYCOLAX) packet Take 17 g by mouth daily.  14 each  2  . warfarin (COUMADIN) 5 MG tablet Take 1/2 on Mondays , Wednesdays and Fridays. 1 tab on all the other days  or as directed by anticoagulation clinic       No current facility-administered medications on file prior to visit.   Allergies  Allergen Reactions  . Ultram [Tramadol] Shortness Of Breath   Immunization History  Administered Date(s) Administered  . Influenza,inj,Quad PF,36+ Mos 04/16/2013   Prior to Admission medications   Medication Sig Start Date End Date Taking? Authorizing Provider  acetaminophen (TYLENOL) 500 MG tablet Take 500 mg by mouth every 6 (six) hours as needed. For pain   Yes Historical Provider, MD  allopurinol (ZYLOPRIM) 300 MG tablet Take 300 mg by mouth every morning.   Yes Historical Provider, MD  atorvastatin (LIPITOR) 40 MG tablet Take 1 tablet (40 mg total) by mouth daily. 06/06/13  Yes Vernie Shanks, MD  baclofen (LIORESAL) 10 MG tablet Take 10 mg by mouth 3 (three) times daily.   Yes Historical Provider, MD  feeding supplement (GLUCERNA SHAKE) LIQD Take 237 mLs by mouth 3 (three) times daily between meals. 06/12/12  Yes Marianne L York, PA-C  furosemide (LASIX) 20 MG tablet Take 20 mg by mouth daily as needed for fluid. 12/02/12  Yes Vernie Shanks, MD  gabapentin (NEURONTIN) 400 MG capsule TAKE ONE CAPSULE BY MOUTH ONCE DAILY IN THE EVENING 05/23/13  Yes Vernie Shanks, MD  HYDROcodone-acetaminophen (NORCO) 7.5-325 MG per tablet Take 1 tablet by mouth 2 (two) times daily as needed for moderate pain. 07/18/13  Yes Vernie Shanks, MD  Lancets (ACCU-CHEK SOFT TOUCH) lancets Use as instructed 05/14/13  Yes Vernie Shanks, MD  loratadine (CLARITIN) 10 MG tablet Take 10 mg by mouth daily.   Yes Historical Provider, MD  losartan (COZAAR) 25 MG tablet Take 25 mg by mouth daily.   Yes Historical Provider, MD  metFORMIN (GLUCOPHAGE) 500 MG tablet Take 500 mg by mouth 2 (two) times daily with a meal.   Yes  Historical Provider, MD  metoprolol tartrate (LOPRESSOR) 25 MG tablet TAKE ONE-HALF TABLET BY MOUTH TWICE DAILY 05/10/13  Yes Vernie Shanks, MD  montelukast (SINGULAIR) 10 MG tablet Take 10 mg by mouth at bedtime.   Yes Historical Provider, MD  neomycin-polymyxin-hydrocortisone (CORTISPORIN) 3.5-10000-1 otic suspension Place 3 drops into the left ear daily as needed (Ear Pain). 12/02/12  Yes Vernie Shanks, MD  polyethylene glycol (MIRALAX / GLYCOLAX) packet Take 17 g by mouth daily. 12/02/12  Yes Vernie Shanks, MD  warfarin (COUMADIN) 5 MG tablet Take 1/2 on Mondays , Wednesdays and Fridays. 1 tab on all the other days  or as directed by anticoagulation clinic 02/24/13  Yes Tammy Eckard, PHARMD     ROS: As above in the HPI. All other systems are stable or negative.  OBJECTIVE: APPEARANCE:  Patient in no acute distress.The patient appeared well nourished and normally developed. Acyanotic. Waist: VITAL SIGNS:BP 129/88  Pulse 88  Temp(Src) 99.2 F (37.3 C) (Oral)  Wt 220 lb (99.791 kg)  Obese AAF SKIN: warm and  Dry without overt rashes, tattoos and scars  HEAD and Neck: without JVD, Head and scalp: normal Eyes:No scleral icterus. Fundi normal, eye movements normal. Ears: Auricle normal, canal normal, Tympanic membranes normal, insufflation normal. Nose: normal Throat: normal Neck & thyroid: normal  CHEST & LUNGS: Chest wall: normal Lungs: Clear  CVS: Reveals the PMI to be normally located. Regular rhythm, First and Second Heart sounds are normal,  absence of murmurs, rubs or gallops. Peripheral vasculature: Radial pulses: normal Dorsal pedis pulses: normal Posterior pulses: normal  ABDOMEN:  Appearance: Obese Benign, no organomegaly, no masses, no Abdominal Aortic enlargement. No Guarding , no rebound. No Bruits. Bowel sounds: normal  RECTAL: N/A GU: N/A  EXTREMETIES: 1+edematous.  NEUROLOGIC: oriented to time,place and person; Right hemiplegia: no  changes  Results for orders placed in visit on 07/29/13  POCT INR      Result Value Ref Range   INR 2.2      ASSESSMENT:  CVA (cerebral vascular accident) - Plan: POCT INR  Long term (current) use of anticoagulants  HTN (hypertension) - Plan: CMP14+EGFR  HLD (hyperlipidemia) - Plan: NMR, lipoprofile  History of CVA (cerebrovascular accident)  DM (diabetes mellitus) - Plan: POCT glycosylated hemoglobin (Hb A1C), CMP14+EGFR  COPD (chronic obstructive pulmonary disease)  Charcot foot due to diabetes mellitus  Pedal edema  Gout - Plan: Uric acid  PLAN: Continue with same dose of protime Continue with Physical Therapy.  Diet and  Exercise weight reduction.  Daily foot checks. DM precautions. No changes  Orders Placed This Encounter  Procedures  . CMP14+EGFR  . NMR, lipoprofile  . Uric acid  . POCT glycosylated hemoglobin (Hb A1C)   No orders of the defined types were placed in this encounter.   There are no discontinued medications. Return in about 4 weeks (around 08/26/2013) for Recheck medical problems.  Ciara Kagan P. Jacelyn Grip, M.D.

## 2013-07-30 ENCOUNTER — Encounter: Payer: Self-pay | Admitting: Family Medicine

## 2013-07-30 DIAGNOSIS — E559 Vitamin D deficiency, unspecified: Secondary | ICD-10-CM | POA: Insufficient documentation

## 2013-07-30 NOTE — Telephone Encounter (Signed)
She should  Switch to Vitamin D 4,000 units daily OTC dose and we will need to check the level at the next visit.Marland Kitchen Refill denied. Bring all medications including vitamins at next office visit.

## 2013-07-30 NOTE — Telephone Encounter (Signed)
Patient notified and stated agreement.

## 2013-07-31 ENCOUNTER — Encounter: Payer: Self-pay | Admitting: Family Medicine

## 2013-07-31 ENCOUNTER — Ambulatory Visit (HOSPITAL_COMMUNITY)
Admission: RE | Admit: 2013-07-31 | Discharge: 2013-07-31 | Disposition: A | Discharge status: Home / Self Care | Payer: PRIVATE HEALTH INSURANCE | Source: Ambulatory Visit | Attending: Family Medicine | Admitting: Family Medicine

## 2013-07-31 ENCOUNTER — Telehealth (HOSPITAL_COMMUNITY): Payer: Self-pay

## 2013-07-31 DIAGNOSIS — G8191 Hemiplegia, unspecified affecting right dominant side: Secondary | ICD-10-CM

## 2013-07-31 DIAGNOSIS — M6281 Muscle weakness (generalized): Secondary | ICD-10-CM

## 2013-07-31 LAB — NMR, LIPOPROFILE
Cholesterol: 142 mg/dL (ref <200)
HDL Cholesterol by NMR: 49 mg/dL (ref >=40)
HDL Particle Number: 34.8 umol/L (ref >=30.5)
LDL Particle Number: 946 nmol/L (ref <1000)
LDL Size: 20.6 nm (ref >20.5)
LDLC SERPL CALC-MCNC: 79 mg/dL (ref <100)
LP-IR Score: 25 (ref <=45)
Small LDL Particle Number: 414 nmol/L (ref <=527)
Triglycerides by NMR: 71 mg/dL (ref <150)

## 2013-07-31 LAB — CMP14+EGFR
ALT: 13 IU/L (ref 0–32)
AST: 23 IU/L (ref 0–40)
Albumin/Globulin Ratio: 1.7 (ref 1.1–2.5)
Albumin: 4 g/dL (ref 3.5–4.8)
Alkaline Phosphatase: 94 IU/L (ref 39–117)
BUN/Creatinine Ratio: 26 (ref 11–26)
BUN: 12 mg/dL (ref 8–27)
CO2: 22 mmol/L (ref 18–29)
Calcium: 9.6 mg/dL (ref 8.7–10.3)
Chloride: 102 mmol/L (ref 97–108)
Creatinine, Ser: 0.47 mg/dL — ABNORMAL LOW (ref 0.57–1.00)
GFR calc Af Amer: 115 mL/min/{1.73_m2} (ref >59)
GFR calc non Af Amer: 100 mL/min/{1.73_m2} (ref >59)
Globulin, Total: 2.4 g/dL (ref 1.5–4.5)
Glucose: 110 mg/dL — ABNORMAL HIGH (ref 65–99)
Potassium: 4.6 mmol/L (ref 3.5–5.2)
Sodium: 143 mmol/L (ref 134–144)
Total Bilirubin: 0.3 mg/dL (ref 0.0–1.2)
Total Protein: 6.4 g/dL (ref 6.0–8.5)

## 2013-07-31 LAB — URIC ACID: Uric Acid: 2.7 mg/dL (ref 2.5–7.1)

## 2013-07-31 NOTE — Progress Notes (Signed)
Physical Therapy Re-evaluation/Progress note/Discharge summary  Patient Details  Name: Brittany Clarke MRN: [id number] Date of Birth: 1940-09-26  Today's Date: 07/31/2013 Time: 0737-1062 PT Time Calculation (min): 61 min Charge: [id number]              Visit#: 4 of 8  Re-eval: 08/22/13 Assessment Diagnosis: CVA 2013 , right hemiparesis  Next MD Visit: Dr. Jacelyn Grip  Prior Therapy: no recent home health or outpatient   Authorization: medicare  [employer], [employer]     Authorization Time Period:    Authorization Visit#: 4 of 10   Subjective Symptoms/Limitations Symptoms: 8/10 Right LE pain.  Discussed difficulty with transportation and decreased mobility at home.   Pain Assessment Currently in Pain?: Yes Pain Score: 8  Pain Location: Leg Pain Orientation: Right  Cognition/Observation Cognition Overall Cognitive Status: Within Functional Limits for tasks assessed Observation/Other Assessments Observations: presents in wheelchair with care taker, wears sling on Right UE during gait   Sensation/Coordination/Flexibility/Functional Tests Functional Tests Functional Tests: Able to ambulate 123 feet in _0   Assessment RLE Strength Right Hip Flexion: 2/5 Right Knee Flexion: 2/5 Right Knee Extension: 3/5 Right Ankle Dorsiflexion: 2-/5 LLE Strength Left Hip Flexion: 3/5 Left Knee Flexion: 3/5 Left Knee Extension: 3+/5 Left Ankle Dorsiflexion: 4/5  Exercise/Treatments Mobility/Balance  Transfers Transfers: Sit to Stand Sit to Stand: 3: Mod assist Sit to Stand Details (indicate cue type and reason): cueing for scooting to assist  Ambulation/Gait Ambulation/Gait: Yes Ambulation/Gait Assistance: 5: Supervision Ambulation/Gait Assistance Details: _1  Ambulation Distance (Feet): 123 Feet Assistive device: Small based quad cane Static Sitting Balance Static Sitting - Balance Support: Right upper extremity supported;Feet supported Static Sitting - Level of Assistance: 7:  Independent Static Standing Balance Static Standing - Balance Support: No upper extremity supported Static Standing - Level of Assistance: 4: Min assist Static Standing - Comment/# of Minutes: 3 min   Standing Gait Training: 123' with SBA with QC, cueing for posture Other Standing Knee Exercises: Static standing x 2 minutes, static stand 2 min with narrow base of support  Other Standing Knee Exercises: sit to stand 6 X with left HHA mat height at 23 in Seated Long Arc Quad: Both;2 sets;10 reps Other Seated Knee Exercises: heel and toe raises 10x 2 Bil LE Other Seated Knee Exercises: Seated hamstring isometrics, isometric adduction with ball, isometric abduction 2 serts x 10 reps  Balance Exercises Standing Cone Rotation: Solid surface;A/P;Limitations Cone Rotation Limitations: Lt UE reaching forward      Physical Therapy Assessment and Plan PT Assessment and Plan Clinical Impression Statement: Following discussion with pt and caregiven decision made to discharge from OPPT to HHPT due to transpertation and mobilty difficulties.  Session focus on improving functional activities to increase ease with sit to stand and ambulation wtih QC.  Pt able to ambulate 123 feet in 5'23" wtih SBA, no LOB episodes noted.  Reviewed goals and pt given HEP for LE strengthenig, pt reported she understood all new exercises and plans to complete.  Evaluation therapist contacted MD office about recommendation for HHPT.   PT Plan: D/C to HEP with recommendations for HHPT.      Goals PT Short Term Goals Time to Complete Short Term Goals: 2 weeks PT Short Term Goal 1: patient independently scoot forward in wheelchair and forward weight shift to begin sit to stand transfer to decrease assist needed for sit to stand  PT Short Term Goal 1 - Progress: Met PT Short Term Goal 2: patient ambulate in clinic 60  feet x 2 with quad cane CGA for home safety and ambulation  PT Short Term Goal 2 - Progress: Met PT Short  Term Goal 3: patient stand with contact guarding and reach with L UE safely for objects within 2 to 3 inches for home safety  PT Short Term Goal 3 - Progress: Progressing toward goal PT Long Term Goals PT Long Term Goal 1: patient transfers sit to stand mod A X 1 for transfers with care taker at home without using lift chair  PT Long Term Goal 1 - Progress: Not met PT Long Term Goal 2: patient ambulate in clinic 5 min CGA and quad cane for home mobility  PT Long Term Goal 2 - Progress: Met  Problem List Patient Active Problem List   Diagnosis Date Noted  . Unspecified vitamin D deficiency 07/30/2013  . Right hemiplegia 06/25/2013  . Muscle weakness (generalized) 06/25/2013  . Need for prophylactic vaccination and inoculation against influenza 04/16/2013  . 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 Esophageal dysphagia 02/11/2013  . Long term (current) use of anticoagulants 01/24/2013  . Rectal bleeding 01/24/2013  . Chronic constipation 12/02/2012  . Pedal edema 12/02/2012  . Otalgia of left ear 12/02/2012  . History of rectal bleeding 12/02/2012  . HLD (hyperlipidemia) 12/02/2012  . Dysuria 12/02/2012  . Pain in joint, ankle and foot 09/10/2012  . PNA (pneumonia) 06/10/2012  . Hypokalemia 06/10/2012  . CHF (congestive heart failure) 06/10/2012  . Morbid obesity 06/10/2012  . History of CVA (cerebrovascular accident) 06/10/2012  . Anemia 06/10/2012  . DDD (degenerative disc disease), lumbar 06/10/2012  . Allergic rhinitis 06/10/2012  . Gout 06/10/2012  . HTN (hypertension) 06/10/2012  . DM (diabetes mellitus) 06/10/2012  . Constipation 06/10/2012  . Urinary incontinence 06/10/2012  . History of breast cancer 06/10/2012  . COPD (chronic obstructive pulmonary disease) 06/10/2012    PT - End of Session Equipment Utilized During Treatment: Gait belt Activity Tolerance: Patient limited by fatigue;Patient tolerated treatment well General Behavior During Therapy: WFL  for tasks assessed/performed PT Plan of Care PT Home Exercise Plan: Given  GP Functional Assessment Tool Used: limted household ambulation with assist and cane  Functional Limitation: Mobility: Walking and moving around Mobility: Walking and Moving Around Goal Status [phone]): At least 60 percent but less than 80 percent impaired, limited or restricted Mobility: Walking and Moving Around Discharge Status [phone]): At least 80 percent but less than 100 percent impaired, limited or restricted  Aldona Lento 07/31/2013, 12:25 PM  Physician Documentation Your signature is required to indicate approval of the treatment plan as stated above.  Please sign and either send electronically or make a copy of this report for your files and return this physician signed original.   Please mark one 1.__approve of plan  2. ___approve of plan with the following conditions.   ______________________________                                                          _____________________ Physician Signature  Date  

## 2013-08-04 ENCOUNTER — Ambulatory Visit (HOSPITAL_COMMUNITY): Payer: PRIVATE HEALTH INSURANCE | Admitting: Physical Therapy

## 2013-08-07 ENCOUNTER — Ambulatory Visit (HOSPITAL_COMMUNITY): Payer: PRIVATE HEALTH INSURANCE | Admitting: Physical Therapy

## 2013-08-21 ENCOUNTER — Telehealth: Payer: Self-pay | Admitting: Family Medicine

## 2013-08-21 ENCOUNTER — Other Ambulatory Visit: Payer: Self-pay | Admitting: Family Medicine

## 2013-08-21 DIAGNOSIS — E11610 Type 2 diabetes mellitus with diabetic neuropathic arthropathy: Secondary | ICD-10-CM

## 2013-08-21 MED ORDER — HYDROCODONE-ACETAMINOPHEN 7.5-325 MG PO TABS: 1.0000 | ORAL_TABLET | Freq: Two times a day (BID) | ORAL | Status: DC | PRN

## 2013-08-21 NOTE — Telephone Encounter (Signed)
Rx ready for pick up. 

## 2013-08-29 ENCOUNTER — Emergency Department (HOSPITAL_COMMUNITY)
Admission: EM | Admit: 2013-08-29 | Discharge: 2013-08-29 | Disposition: A | Discharge status: Home / Self Care | Payer: PRIVATE HEALTH INSURANCE | Attending: Emergency Medicine | Admitting: Emergency Medicine

## 2013-08-29 ENCOUNTER — Encounter (HOSPITAL_COMMUNITY): Payer: Self-pay | Admitting: Emergency Medicine

## 2013-08-29 ENCOUNTER — Telehealth: Payer: Self-pay | Admitting: Family Medicine

## 2013-08-29 DIAGNOSIS — E119 Type 2 diabetes mellitus without complications: Secondary | ICD-10-CM | POA: Insufficient documentation

## 2013-08-29 DIAGNOSIS — Z7901 Long term (current) use of anticoagulants: Secondary | ICD-10-CM | POA: Insufficient documentation

## 2013-08-29 DIAGNOSIS — I509 Heart failure, unspecified: Secondary | ICD-10-CM | POA: Insufficient documentation

## 2013-08-29 DIAGNOSIS — Z792 Long term (current) use of antibiotics: Secondary | ICD-10-CM | POA: Insufficient documentation

## 2013-08-29 DIAGNOSIS — Z8673 Personal history of transient ischemic attack (TIA), and cerebral infarction without residual deficits: Secondary | ICD-10-CM | POA: Insufficient documentation

## 2013-08-29 DIAGNOSIS — Z8701 Personal history of pneumonia (recurrent): Secondary | ICD-10-CM | POA: Insufficient documentation

## 2013-08-29 DIAGNOSIS — K625 Hemorrhage of anus and rectum: Secondary | ICD-10-CM | POA: Insufficient documentation

## 2013-08-29 DIAGNOSIS — J4489 Other specified chronic obstructive pulmonary disease: Secondary | ICD-10-CM | POA: Insufficient documentation

## 2013-08-29 DIAGNOSIS — J449 Chronic obstructive pulmonary disease, unspecified: Secondary | ICD-10-CM | POA: Insufficient documentation

## 2013-08-29 DIAGNOSIS — Z79899 Other long term (current) drug therapy: Secondary | ICD-10-CM | POA: Insufficient documentation

## 2013-08-29 DIAGNOSIS — R21 Rash and other nonspecific skin eruption: Secondary | ICD-10-CM | POA: Insufficient documentation

## 2013-08-29 DIAGNOSIS — I10 Essential (primary) hypertension: Secondary | ICD-10-CM | POA: Insufficient documentation

## 2013-08-29 DIAGNOSIS — M109 Gout, unspecified: Secondary | ICD-10-CM | POA: Insufficient documentation

## 2013-08-29 DIAGNOSIS — F411 Generalized anxiety disorder: Secondary | ICD-10-CM | POA: Insufficient documentation

## 2013-08-29 DIAGNOSIS — IMO0002 Reserved for concepts with insufficient information to code with codable children: Secondary | ICD-10-CM | POA: Insufficient documentation

## 2013-08-29 DIAGNOSIS — Z87898 Personal history of other specified conditions: Secondary | ICD-10-CM | POA: Insufficient documentation

## 2013-08-29 LAB — COMPREHENSIVE METABOLIC PANEL
ALK PHOS: 102 U/L (ref 39–117)
ALT: 17 U/L (ref 0–35)
AST: 22 U/L (ref 0–37)
Albumin: 3.4 g/dL — ABNORMAL LOW (ref 3.5–5.2)
BUN: 12 mg/dL (ref 6–23)
CALCIUM: 9.8 mg/dL (ref 8.4–10.5)
CO2: 26 mEq/L (ref 19–32)
CREATININE: 0.43 mg/dL — AB (ref 0.50–1.10)
Chloride: 104 mEq/L (ref 96–112)
GFR calc non Af Amer: >90 mL/min (ref >90)
GLUCOSE: 131 mg/dL — AB (ref 70–99)
Potassium: 4.6 mEq/L (ref 3.7–5.3)
Sodium: 142 mEq/L (ref 137–147)
TOTAL PROTEIN: 7.5 g/dL (ref 6.0–8.3)
Total Bilirubin: 0.3 mg/dL (ref 0.3–1.2)

## 2013-08-29 LAB — CBC WITH DIFFERENTIAL/PLATELET
BASOS PCT: 1 % (ref 0–1)
Basophils Absolute: 0.1 10*3/uL (ref 0.0–0.1)
EOS PCT: 3 % (ref 0–5)
Eosinophils Absolute: 0.2 10*3/uL (ref 0.0–0.7)
HCT: 35.6 % — ABNORMAL LOW (ref 36.0–46.0)
HEMOGLOBIN: 11.2 g/dL — AB (ref 12.0–15.0)
LYMPHS ABS: 2 10*3/uL (ref 0.7–4.0)
Lymphocytes Relative: 28 % (ref 12–46)
MCH: 28.5 pg (ref 26.0–34.0)
MCHC: 31.5 g/dL (ref 30.0–36.0)
MCV: 90.6 fL (ref 78.0–100.0)
MONO ABS: 0.5 10*3/uL (ref 0.1–1.0)
MONOS PCT: 6 % (ref 3–12)
Neutro Abs: 4.3 10*3/uL (ref 1.7–7.7)
Neutrophils Relative %: 62 % (ref 43–77)
Platelets: 269 10*3/uL (ref 150–400)
RBC: 3.93 MIL/uL (ref 3.87–5.11)
RDW: 15.1 % (ref 11.5–15.5)
WBC: 7 10*3/uL (ref 4.0–10.5)

## 2013-08-29 LAB — PROTIME-INR
INR: 1.84 — ABNORMAL HIGH (ref 0.00–1.49)
Prothrombin Time: 20.7 seconds — ABNORMAL HIGH (ref 11.6–15.2)

## 2013-08-29 MED ORDER — DOXYCYCLINE HYCLATE 100 MG PO CAPS: 100.0000 mg | ORAL_CAPSULE | Freq: Two times a day (BID) | ORAL | Status: DC

## 2013-08-29 MED ORDER — TRIAMCINOLONE ACETONIDE 0.1 % EX CREA: 1.0000 "application " | TOPICAL_CREAM | Freq: Two times a day (BID) | CUTANEOUS | Status: DC

## 2013-08-29 NOTE — ED Provider Notes (Signed)
CSN: [id number]     Arrival date & time 08/29/13  1238 History   First MD Initiated Contact with Patient 08/29/13 1445     Chief Complaint  Patient presents with  . Rectal Bleeding     (Consider location/radiation/quality/duration/timing/severity/associated sxs/prior Treatment) HPI... rectal bleeding intermittently since October. Status post colonoscopy in October 2014 which did not find any obvious cause for the bleeding. Bleeding is minimal. No black stool. Severity is mild. No vomiting, diarrhea, fever, chills. Patient is also concerned about a rash in her right axilla  Past Medical History  Diagnosis Date  . CHF (congestive heart failure)   . Diabetes mellitus without complication   . COPD (chronic obstructive pulmonary disease)   . Hypertension   . Asthma   . Shortness of breath   . Stroke     2013  . Anxiety   . Pneumonia   . Gout   . Cancer     lymph nodes removed on the right, breat cancer   Past Surgical History  Procedure Laterality Date  . Breast surgery    . Tubal ligation    . Colonoscopy  04/04/2002    QJJ:HERDEYCX hemorrhoids, otherwise, normal rectum/Scattered left sided diverticula/Somewhat adenomatous appearing ileocecal valve which is probably normal but this area was biopsied. The remainder of the colonic mucosa appeared   normal  . Trigger finger surgery      twice  . Colonoscopy with esophagogastroduodenoscopy (egd) N/A 02/20/2013    Procedure: COLONOSCOPY WITH ESOPHAGOGASTRODUODENOSCOPY (EGD);  Surgeon: Daneil Dolin, MD;  Location: [facility];  Service: Endoscopy;  Laterality: N/A;   Family History  Problem Relation Age of Onset  . Colon cancer Neg Hx   . Liver disease Neg Hx    History  Substance Use Topics  . Smoking status: Never Smoker   . Smokeless tobacco: Not on file  . Alcohol Use: No   OB History   Grav Para Term Preterm Abortions TAB SAB Ect Mult Living                 Review of Systems  All other systems reviewed and are  negative.     Allergies  Ultram  Home Medications   Current Outpatient Rx  Name  Route  Sig  Dispense  Refill  . acetaminophen (TYLENOL) 500 MG tablet   Oral   Take 500 mg by mouth every 6 (six) hours as needed. For pain         . allopurinol (ZYLOPRIM) 300 MG tablet   Oral   Take 300 mg by mouth every morning.         Marland Kitchen atorvastatin (LIPITOR) 40 MG tablet   Oral   Take 1 tablet (40 mg total) by mouth daily.   30 tablet   3   . baclofen (LIORESAL) 10 MG tablet   Oral   Take 10 mg by mouth 3 (three) times daily.         . feeding supplement (GLUCERNA SHAKE) LIQD   Oral   Take 237 mLs by mouth 3 (three) times daily between meals.   60 Can   1   . furosemide (LASIX) 20 MG tablet   Oral   Take 20 mg by mouth daily as needed for fluid.         Marland Kitchen gabapentin (NEURONTIN) 100 MG capsule   Oral   Take 100 mg by mouth 2 (two) times daily.         Marland Kitchen gabapentin (  NEURONTIN) 400 MG capsule   Oral   Take 400 mg by mouth every evening.         Marland Kitchen HYDROcodone-acetaminophen (NORCO) 7.5-325 MG per tablet   Oral   Take 1 tablet by mouth 2 (two) times daily as needed for moderate pain.   60 tablet   0   . loratadine (CLARITIN) 10 MG tablet   Oral   Take 10 mg by mouth daily.         Marland Kitchen losartan (COZAAR) 25 MG tablet   Oral   Take 25 mg by mouth daily.         . metFORMIN (GLUCOPHAGE) 500 MG tablet   Oral   Take 500 mg by mouth 2 (two) times daily with a meal.         . metoprolol tartrate (LOPRESSOR) 25 MG tablet   Oral   Take 12.5 mg by mouth 2 (two) times daily.         . montelukast (SINGULAIR) 10 MG tablet   Oral   Take 10 mg by mouth at bedtime.         Marland Kitchen neomycin-polymyxin-hydrocortisone (CORTISPORIN) 3.5-10000-1 otic suspension   Left Ear   Place 3 drops into the left ear daily as needed (Ear Pain).         . polyethylene glycol (MIRALAX / GLYCOLAX) packet   Oral   Take 17 g by mouth daily.   14 each   2   . Vitamin D,  Ergocalciferol, (DRISDOL) 50000 UNITS CAPS capsule   Oral   Take 50,000 Units by mouth every 7 (seven) days.         Marland Kitchen warfarin (COUMADIN) 5 MG tablet      Take 1/2 on Mondays , Wednesdays and Fridays. 1 tab on all the other days  or as directed by anticoagulation clinic         . doxycycline (VIBRAMYCIN) 100 MG capsule   Oral   Take 1 capsule (100 mg total) by mouth 2 (two) times daily. One po bid x 7 days   14 capsule   0   . triamcinolone cream (KENALOG) 0.1 %   Topical   Apply 1 application topically 2 (two) times daily.   30 g   1    BP 128/81  Pulse 79  Temp(Src) 99.5 F (37.5 C) (Oral)  Resp 18  Ht 5\' 5"  (1.651 m)  Wt 220 lb (99.791 kg)  BMI 36.61 kg/m2  SpO2 97% Physical Exam  Nursing note and vitals reviewed. Constitutional: She is oriented to person, place, and time. She appears well-developed and well-nourished.  HENT:  Head: Normocephalic and atraumatic.  Eyes: Conjunctivae and EOM are normal. Pupils are equal, round, and reactive to light.  Neck: Normal range of motion. Neck supple.  Cardiovascular: Normal rate, regular rhythm and normal heart sounds.   Pulmonary/Chest: Effort normal and breath sounds normal.  Abdominal: Soft. Bowel sounds are normal.  Genitourinary:  Rectal exam: No masses. Minimally heme-positive  Musculoskeletal: Normal range of motion.  Neurological: She is alert and oriented to person, place, and time.  Skin:  Right axilla: Several satellite papular erythematous lesions approximately 3 mm in diameter consistent with mild cellulitis  Psychiatric: She has a normal mood and affect. Her behavior is normal.    ED Course  Procedures (including critical care time) Labs Review Labs Reviewed  CBC WITH DIFFERENTIAL - Abnormal; Notable for the following:    Hemoglobin 11.2 (*)    HCT  35.6 (*)    All other components within normal limits  COMPREHENSIVE METABOLIC PANEL - Abnormal; Notable for the following:    Glucose, Bld 131 (*)     Creatinine, Ser 0.43 (*)    Albumin 3.4 (*)    All other components within normal limits  PROTIME-INR - Abnormal; Notable for the following:    Prothrombin Time 20.7 (*)    INR 1.84 (*)    All other components within normal limits  POC OCCULT BLOOD, ED   Imaging Review No results found.   EKG Interpretation None      MDM   Final diagnoses:  Rectal bleeding  Rash    Patient is hemodynamically stable. She has a gastroenterology followup. Rx doxycycline and triamcinolone cream for her rash    Nat Christen, MD 08/30/13 2135

## 2013-08-29 NOTE — Telephone Encounter (Signed)
She is having pain and when she has a BM. Spoke with Dr.wong and he advise to go to the ER. Called patient back and advised her to go to the ER. She verbalizes understanding

## 2013-08-29 NOTE — Discharge Instructions (Signed)
Your blood count was stable. Followup your primary care Dr.    Prescriptions for ointment and antibiotic for your rash.

## 2013-08-29 NOTE — ED Notes (Addendum)
Pt says rectal bleeding since Oct, Had colonoscopy and did not find out cause of bleeding,  Cont to have intermittent bleeding since then with BM's,  Now also having rectal pain.    Pt has rt arm in a sling s/p stroke.  Also has some "bumps" on rt breast area.

## 2013-09-01 LAB — POC OCCULT BLOOD, ED: Fecal Occult Bld: POSITIVE — AB

## 2013-09-04 ENCOUNTER — Ambulatory Visit (INDEPENDENT_AMBULATORY_CARE_PROVIDER_SITE_OTHER): Payer: Medicare Other | Admitting: Family Medicine

## 2013-09-04 ENCOUNTER — Encounter: Payer: Self-pay | Admitting: Family Medicine

## 2013-09-04 DIAGNOSIS — E11610 Type 2 diabetes mellitus with diabetic neuropathic arthropathy: Secondary | ICD-10-CM

## 2013-09-04 DIAGNOSIS — R600 Localized edema: Secondary | ICD-10-CM

## 2013-09-04 DIAGNOSIS — Z7901 Long term (current) use of anticoagulants: Secondary | ICD-10-CM

## 2013-09-04 DIAGNOSIS — E559 Vitamin D deficiency, unspecified: Secondary | ICD-10-CM

## 2013-09-04 DIAGNOSIS — L8990 Pressure ulcer of unspecified site, unspecified stage: Secondary | ICD-10-CM

## 2013-09-04 DIAGNOSIS — K625 Hemorrhage of anus and rectum: Secondary | ICD-10-CM

## 2013-09-04 DIAGNOSIS — E119 Type 2 diabetes mellitus without complications: Secondary | ICD-10-CM

## 2013-09-04 DIAGNOSIS — R609 Edema, unspecified: Secondary | ICD-10-CM

## 2013-09-04 DIAGNOSIS — Z853 Personal history of malignant neoplasm of breast: Secondary | ICD-10-CM

## 2013-09-04 DIAGNOSIS — I509 Heart failure, unspecified: Secondary | ICD-10-CM

## 2013-09-04 DIAGNOSIS — L8992 Pressure ulcer of unspecified site, stage 2: Secondary | ICD-10-CM

## 2013-09-04 DIAGNOSIS — J449 Chronic obstructive pulmonary disease, unspecified: Secondary | ICD-10-CM

## 2013-09-04 DIAGNOSIS — E1149 Type 2 diabetes mellitus with other diabetic neurological complication: Secondary | ICD-10-CM

## 2013-09-04 DIAGNOSIS — E785 Hyperlipidemia, unspecified: Secondary | ICD-10-CM

## 2013-09-04 DIAGNOSIS — Z8673 Personal history of transient ischemic attack (TIA), and cerebral infarction without residual deficits: Secondary | ICD-10-CM

## 2013-09-04 DIAGNOSIS — I10 Essential (primary) hypertension: Secondary | ICD-10-CM

## 2013-09-04 DIAGNOSIS — M109 Gout, unspecified: Secondary | ICD-10-CM

## 2013-09-04 LAB — POCT CBC
Granulocyte percent: 56.7 %G (ref 37–80)
HCT, POC: 35.9 % — AB (ref 37.7–47.9)
Hemoglobin: 11 g/dL — AB (ref 12.2–16.2)
Lymph, poc: 2.5 (ref 0.6–3.4)
MCH, POC: 26.9 pg — AB (ref 27–31.2)
MCHC: 30.6 g/dL — AB (ref 31.8–35.4)
MCV: 87.9 fL (ref 80–97)
MPV: 6.8 fL (ref 0–99.8)
POC Granulocyte: 3.9 (ref 2–6.9)
POC LYMPH PERCENT: 36.7 %L (ref 10–50)
Platelet Count, POC: 284 10*3/uL (ref 142–424)
RBC: 4.1 M/uL (ref 4.04–5.48)
RDW, POC: 16.6 %
WBC: 6.9 10*3/uL (ref 4.6–10.2)

## 2013-09-04 LAB — POCT INR: INR: 2.1

## 2013-09-04 NOTE — Progress Notes (Signed)
Patient ID: Brittany Clarke, female   DOB: Dec 05, 1940, 73 y.o.   MRN: [id number] SUBJECTIVE: CC: Chief Complaint  Patient presents with  . Follow-up    4 week ck up  was seen in ER for rectal bleeding     HPI: Here for 4 weeks follow up. In the interim she had rectal bleeding and source not found.most likely hemorrhoids. Here for follow up of anticoagulation management. Her CVA stable, in PT and that is doing well. DM stable. She has a new pressure sore on the tail bone.  Past Medical History  Diagnosis Date  . CHF (congestive heart failure)   . Diabetes mellitus without complication   . COPD (chronic obstructive pulmonary disease)   . Hypertension   . Asthma   . Shortness of breath   . Stroke     2013  . Anxiety   . Pneumonia   . Gout   . Cancer     lymph nodes removed on the right, breat cancer   Past Surgical History  Procedure Laterality Date  . Breast surgery    . Tubal ligation    . Colonoscopy  04/04/2002    PPI:RJJOACZY hemorrhoids, otherwise, normal rectum/Scattered left sided diverticula/Somewhat adenomatous appearing ileocecal valve which is probably normal but this area was biopsied. The remainder of the colonic mucosa appeared   normal  . Trigger finger surgery      twice  . Colonoscopy with esophagogastroduodenoscopy (egd) N/A 02/20/2013    Procedure: COLONOSCOPY WITH ESOPHAGOGASTRODUODENOSCOPY (EGD);  Surgeon: Daneil Dolin, MD;  Location: [facility];  Service: Endoscopy;  Laterality: N/A;   History   Social History  . Marital Status: Legally Separated    Spouse Name: N/A    Number of Children: 5  . Years of Education: N/A   Occupational History  . Not on file.   Social History Main Topics  . Smoking status: Never Smoker   . Smokeless tobacco: Not on file  . Alcohol Use: No  . Drug Use: No  . Sexual Activity: Not on file   Other Topics Concern  . Not on file   Social History Narrative  . No narrative on file   Family History   Problem Relation Age of Onset  . Colon cancer Neg Hx   . Liver disease Neg Hx    Current Outpatient Prescriptions on File Prior to Visit  Medication Sig Dispense Refill  . acetaminophen (TYLENOL) 500 MG tablet Take 500 mg by mouth every 6 (six) hours as needed. For pain      . allopurinol (ZYLOPRIM) 300 MG tablet Take 300 mg by mouth every morning.      Marland Kitchen atorvastatin (LIPITOR) 40 MG tablet Take 1 tablet (40 mg total) by mouth daily.  30 tablet  3  . baclofen (LIORESAL) 10 MG tablet Take 10 mg by mouth 3 (three) times daily.      Marland Kitchen doxycycline (VIBRAMYCIN) 100 MG capsule Take 1 capsule (100 mg total) by mouth 2 (two) times daily. One po bid x 7 days  14 capsule  0  . feeding supplement (GLUCERNA SHAKE) LIQD Take 237 mLs by mouth 3 (three) times daily between meals.  60 Can  1  . furosemide (LASIX) 20 MG tablet Take 20 mg by mouth daily as needed for fluid.      Marland Kitchen gabapentin (NEURONTIN) 100 MG capsule Take 100 mg by mouth 2 (two) times daily.      Marland Kitchen gabapentin (NEURONTIN) 400 MG capsule  Take 400 mg by mouth every evening.      Marland Kitchen HYDROcodone-acetaminophen (NORCO) 7.5-325 MG per tablet Take 1 tablet by mouth 2 (two) times daily as needed for moderate pain.  60 tablet  0  . loratadine (CLARITIN) 10 MG tablet Take 10 mg by mouth daily.      Marland Kitchen losartan (COZAAR) 25 MG tablet Take 25 mg by mouth daily.      . metFORMIN (GLUCOPHAGE) 500 MG tablet Take 500 mg by mouth 2 (two) times daily with a meal.      . metoprolol tartrate (LOPRESSOR) 25 MG tablet Take 12.5 mg by mouth 2 (two) times daily.      . montelukast (SINGULAIR) 10 MG tablet Take 10 mg by mouth at bedtime.      Marland Kitchen neomycin-polymyxin-hydrocortisone (CORTISPORIN) 3.5-10000-1 otic suspension Place 3 drops into the left ear daily as needed (Ear Pain).      . polyethylene glycol (MIRALAX / GLYCOLAX) packet Take 17 g by mouth daily.  14 each  2  . triamcinolone cream (KENALOG) 0.1 % Apply 1 application topically 2 (two) times daily.  30 g  1  .  Vitamin D, Ergocalciferol, (DRISDOL) 50000 UNITS CAPS capsule Take 50,000 Units by mouth every 7 (seven) days.      Marland Kitchen warfarin (COUMADIN) 5 MG tablet Take 1/2 on Mondays , Wednesdays and Fridays. 1 tab on all the other days  or as directed by anticoagulation clinic       No current facility-administered medications on file prior to visit.   Allergies  Allergen Reactions  . Ultram [Tramadol] Shortness Of Breath   Immunization History  Administered Date(s) Administered  . Influenza,inj,Quad PF,36+ Mos 04/16/2013   Prior to Admission medications   Medication Sig Start Date End Date Taking? Authorizing Provider  acetaminophen (TYLENOL) 500 MG tablet Take 500 mg by mouth every 6 (six) hours as needed. For pain   Yes Historical Provider, MD  allopurinol (ZYLOPRIM) 300 MG tablet Take 300 mg by mouth every morning.   Yes Historical Provider, MD  atorvastatin (LIPITOR) 40 MG tablet Take 1 tablet (40 mg total) by mouth daily. 06/06/13  Yes Vernie Shanks, MD  baclofen (LIORESAL) 10 MG tablet Take 10 mg by mouth 3 (three) times daily.   Yes Historical Provider, MD  doxycycline (VIBRAMYCIN) 100 MG capsule Take 1 capsule (100 mg total) by mouth 2 (two) times daily. One po bid x 7 days 08/29/13  Yes Nat Christen, MD  feeding supplement (GLUCERNA SHAKE) LIQD Take 237 mLs by mouth 3 (three) times daily between meals. 06/12/12  Yes Marianne L York, PA-C  furosemide (LASIX) 20 MG tablet Take 20 mg by mouth daily as needed for fluid. 12/02/12  Yes Vernie Shanks, MD  gabapentin (NEURONTIN) 100 MG capsule Take 100 mg by mouth 2 (two) times daily.   Yes Historical Provider, MD  gabapentin (NEURONTIN) 400 MG capsule Take 400 mg by mouth every evening.   Yes Historical Provider, MD  HYDROcodone-acetaminophen (NORCO) 7.5-325 MG per tablet Take 1 tablet by mouth 2 (two) times daily as needed for moderate pain. 08/21/13  Yes Vernie Shanks, MD  loratadine (CLARITIN) 10 MG tablet Take 10 mg by mouth daily.   Yes Historical  Provider, MD  losartan (COZAAR) 25 MG tablet Take 25 mg by mouth daily.   Yes Historical Provider, MD  metFORMIN (GLUCOPHAGE) 500 MG tablet Take 500 mg by mouth 2 (two) times daily with a meal.   Yes Historical Provider,  MD  metoprolol tartrate (LOPRESSOR) 25 MG tablet Take 12.5 mg by mouth 2 (two) times daily.   Yes Historical Provider, MD  montelukast (SINGULAIR) 10 MG tablet Take 10 mg by mouth at bedtime.   Yes Historical Provider, MD  neomycin-polymyxin-hydrocortisone (CORTISPORIN) 3.5-10000-1 otic suspension Place 3 drops into the left ear daily as needed (Ear Pain). 12/02/12  Yes Vernie Shanks, MD  polyethylene glycol (MIRALAX / GLYCOLAX) packet Take 17 g by mouth daily. 12/02/12  Yes Vernie Shanks, MD  triamcinolone cream (KENALOG) 0.1 % Apply 1 application topically 2 (two) times daily. 08/29/13  Yes Nat Christen, MD  Vitamin D, Ergocalciferol, (DRISDOL) 50000 UNITS CAPS capsule Take 50,000 Units by mouth every 7 (seven) days.   Yes Historical Provider, MD  warfarin (COUMADIN) 5 MG tablet Take 1/2 on Mondays , Wednesdays and Fridays. 1 tab on all the other days  or as directed by anticoagulation clinic 02/24/13  Yes Tammy Eckard, PHARMD     ROS: As above in the HPI. All other systems are stable or negative.  OBJECTIVE: APPEARANCE:  Patient in no acute distress.The patient appeared well nourished and normally developed. Acyanotic. Waist: VITAL SIGNS:BP 124/79  Pulse 72  Temp(Src) 98.9 F (37.2 C) (Oral)  Obese AAF Wheelchair bound  SKIN: warm and  Dry without overt rashes, tattoos and scars. Stage 2 pressure in the gluteal.crease  HEAD and Neck: without JVD, Head and scalp: normal Eyes:No scleral icterus. Fundi normal, eye movements normal. Ears: Auricle normal, canal normal, Tympanic membranes normal, insufflation normal. Nose: normal Throat: normal Neck & thyroid: normal  CHEST & LUNGS: Chest wall: normal Lungs: Clear  CVS: Reveals the PMI to be normally  located. Regular rhythm, First and Second Heart sounds are normal,  absence of murmurs, rubs or gallops. Peripheral vasculature: Radial pulses: normal Dorsal pedis pulses: normal Posterior pulses: normal  ABDOMEN:  Appearance: Obesity Benign, no organomegaly, no masses, no Abdominal Aortic enlargement. No Guarding , no rebound. No Bruits. Bowel sounds: normal  RECTAL: N/A GU: N/A  EXTREMETIES: nonedematous.  MUSCULOSKELETAL:  Spine: normal Joints: intact  NEUROLOGIC: oriented to time,place and person; right hemiplegia  Results for orders placed in visit on 09/04/13  POCT INR      Result Value Ref Range   INR 2.1      ASSESSMENT: Morbid obesity  Long term (current) use of anticoagulants  HTN (hypertension)  HLD (hyperlipidemia)  History of CVA (cerebrovascular accident) - Plan: POCT INR  History of breast cancer  DM (diabetes mellitus)  COPD (chronic obstructive pulmonary disease)  Unspecified vitamin D deficiency  Pedal edema  Charcot foot due to diabetes mellitus  Gout  CHF (congestive heart failure)  Pressure ulcer stage II - Plan: Ambulatory referral to Home Health  Rectal bleeding - Plan: POCT CBC  PLAN: Referral to [facility] for ulcer care. Use her donut seat.  Orders Placed This Encounter  Procedures  . Ambulatory referral to Home Health    Referral Priority:  Routine    Referral Type:  Home Health Care    Referral Reason:  Specialty Services Required    Requested Specialty:  [place]    Number of Visits Requested:  1  . POCT CBC  . POCT INR   Results for orders placed in visit on 09/04/13  POCT INR      Result Value Ref Range   INR 2.1    no change in warfarin.  No orders of the defined types were placed in this  encounter.   There are no discontinued medications. Return in about 4 weeks (around 10/02/2013) for recheck protime.  Jasilyn Holderman P. Jacelyn Grip, M.D.

## 2013-09-08 ENCOUNTER — Telehealth: Payer: Self-pay | Admitting: Family Medicine

## 2013-09-08 NOTE — Progress Notes (Signed)
Referral sent to [facility] for wound care. OV notes and demos sent

## 2013-09-10 ENCOUNTER — Other Ambulatory Visit: Payer: Self-pay | Admitting: Family Medicine

## 2013-09-11 ENCOUNTER — Telehealth: Payer: Self-pay | Admitting: Family Medicine

## 2013-09-13 ENCOUNTER — Other Ambulatory Visit: Payer: Self-pay | Admitting: Family Medicine

## 2013-09-13 DIAGNOSIS — I639 Cerebral infarction, unspecified: Secondary | ICD-10-CM

## 2013-09-13 NOTE — Telephone Encounter (Signed)
Referral done in [facility].

## 2013-09-15 NOTE — Telephone Encounter (Signed)
Referral done

## 2013-09-17 ENCOUNTER — Other Ambulatory Visit: Payer: Self-pay | Admitting: Family Medicine

## 2013-09-18 ENCOUNTER — Telehealth: Payer: Self-pay | Admitting: Family Medicine

## 2013-09-18 ENCOUNTER — Other Ambulatory Visit: Payer: Self-pay | Admitting: Family Medicine

## 2013-09-18 ENCOUNTER — Ambulatory Visit: Payer: PRIVATE HEALTH INSURANCE | Attending: Family Medicine | Admitting: Physical Therapy

## 2013-09-18 DIAGNOSIS — R262 Difficulty in walking, not elsewhere classified: Secondary | ICD-10-CM | POA: Insufficient documentation

## 2013-09-18 DIAGNOSIS — IMO0001 Reserved for inherently not codable concepts without codable children: Secondary | ICD-10-CM | POA: Diagnosis not present

## 2013-09-18 DIAGNOSIS — E11610 Type 2 diabetes mellitus with diabetic neuropathic arthropathy: Secondary | ICD-10-CM

## 2013-09-18 DIAGNOSIS — I10 Essential (primary) hypertension: Secondary | ICD-10-CM | POA: Diagnosis not present

## 2013-09-18 DIAGNOSIS — R5381 Other malaise: Secondary | ICD-10-CM | POA: Insufficient documentation

## 2013-09-18 DIAGNOSIS — E119 Type 2 diabetes mellitus without complications: Secondary | ICD-10-CM | POA: Insufficient documentation

## 2013-09-18 DIAGNOSIS — I69998 Other sequelae following unspecified cerebrovascular disease: Secondary | ICD-10-CM | POA: Insufficient documentation

## 2013-09-18 DIAGNOSIS — M6281 Muscle weakness (generalized): Secondary | ICD-10-CM | POA: Diagnosis not present

## 2013-09-18 MED ORDER — HYDROCODONE-ACETAMINOPHEN 7.5-325 MG PO TABS: 1.0000 | ORAL_TABLET | Freq: Two times a day (BID) | ORAL | Status: DC | PRN

## 2013-09-18 NOTE — Telephone Encounter (Signed)
Rx ready for pick up. 

## 2013-09-19 ENCOUNTER — Other Ambulatory Visit: Payer: Self-pay | Admitting: Family Medicine

## 2013-09-23 ENCOUNTER — Ambulatory Visit: Payer: PRIVATE HEALTH INSURANCE | Attending: Family Medicine | Admitting: *Deleted

## 2013-09-23 DIAGNOSIS — R262 Difficulty in walking, not elsewhere classified: Secondary | ICD-10-CM | POA: Insufficient documentation

## 2013-09-23 DIAGNOSIS — I10 Essential (primary) hypertension: Secondary | ICD-10-CM | POA: Diagnosis not present

## 2013-09-23 DIAGNOSIS — E119 Type 2 diabetes mellitus without complications: Secondary | ICD-10-CM | POA: Insufficient documentation

## 2013-09-23 DIAGNOSIS — I69998 Other sequelae following unspecified cerebrovascular disease: Secondary | ICD-10-CM | POA: Diagnosis not present

## 2013-09-23 DIAGNOSIS — IMO0001 Reserved for inherently not codable concepts without codable children: Secondary | ICD-10-CM | POA: Insufficient documentation

## 2013-09-23 DIAGNOSIS — R5381 Other malaise: Secondary | ICD-10-CM | POA: Insufficient documentation

## 2013-09-23 DIAGNOSIS — M6281 Muscle weakness (generalized): Secondary | ICD-10-CM | POA: Diagnosis not present

## 2013-09-25 ENCOUNTER — Ambulatory Visit: Payer: PRIVATE HEALTH INSURANCE | Admitting: Physical Therapy

## 2013-09-25 DIAGNOSIS — IMO0001 Reserved for inherently not codable concepts without codable children: Secondary | ICD-10-CM | POA: Diagnosis not present

## 2013-09-30 ENCOUNTER — Ambulatory Visit: Payer: PRIVATE HEALTH INSURANCE | Admitting: *Deleted

## 2013-09-30 DIAGNOSIS — IMO0001 Reserved for inherently not codable concepts without codable children: Secondary | ICD-10-CM | POA: Diagnosis not present

## 2013-10-02 ENCOUNTER — Ambulatory Visit: Payer: PRIVATE HEALTH INSURANCE | Admitting: *Deleted

## 2013-10-02 ENCOUNTER — Other Ambulatory Visit: Payer: Self-pay

## 2013-10-02 DIAGNOSIS — IMO0001 Reserved for inherently not codable concepts without codable children: Secondary | ICD-10-CM | POA: Diagnosis not present

## 2013-10-02 MED ORDER — LORATADINE 10 MG PO TABS: 10.0000 mg | ORAL_TABLET | Freq: Every day | ORAL | Status: DC

## 2013-10-08 ENCOUNTER — Ambulatory Visit: Payer: PRIVATE HEALTH INSURANCE | Admitting: Physical Therapy

## 2013-10-08 DIAGNOSIS — IMO0001 Reserved for inherently not codable concepts without codable children: Secondary | ICD-10-CM | POA: Diagnosis not present

## 2013-10-09 ENCOUNTER — Ambulatory Visit: Payer: PRIVATE HEALTH INSURANCE | Admitting: Physical Therapy

## 2013-10-09 DIAGNOSIS — IMO0001 Reserved for inherently not codable concepts without codable children: Secondary | ICD-10-CM | POA: Diagnosis not present

## 2013-10-15 ENCOUNTER — Ambulatory Visit: Payer: PRIVATE HEALTH INSURANCE | Admitting: Physical Therapy

## 2013-10-15 DIAGNOSIS — IMO0001 Reserved for inherently not codable concepts without codable children: Secondary | ICD-10-CM | POA: Diagnosis not present

## 2013-10-16 ENCOUNTER — Ambulatory Visit (INDEPENDENT_AMBULATORY_CARE_PROVIDER_SITE_OTHER): Payer: Medicare Other | Admitting: Pharmacist

## 2013-10-16 DIAGNOSIS — I2510 Atherosclerotic heart disease of native coronary artery without angina pectoris: Secondary | ICD-10-CM

## 2013-10-16 DIAGNOSIS — Z7901 Long term (current) use of anticoagulants: Secondary | ICD-10-CM

## 2013-10-16 LAB — POCT INR: INR: 2

## 2013-10-16 MED ORDER — TRIAMCINOLONE ACETONIDE 0.1 % EX CREA: TOPICAL_CREAM | CUTANEOUS | Status: DC

## 2013-10-16 MED ORDER — NYSTATIN 100000 UNIT/GM EX CREA: TOPICAL_CREAM | CUTANEOUS | Status: DC

## 2013-10-16 NOTE — Patient Instructions (Signed)
Anticoagulation Dose Instructions as of 10/16/2013     Brittany Clarke Tue Wed Thu Fri Sat   New Dose 5 mg 2.5 mg 5 mg 2.5 mg 5 mg 2.5 mg 5 mg    Description       Continue to take warfarin 1/2 tablet on mondays, wednesdays and fridays and 1 tablet all other days.       INR was 2.0 today   Try Gold Bond powder applied to under arm after bathing / shower

## 2013-10-19 DIAGNOSIS — E119 Type 2 diabetes mellitus without complications: Secondary | ICD-10-CM

## 2013-10-19 DIAGNOSIS — M6281 Muscle weakness (generalized): Secondary | ICD-10-CM

## 2013-10-19 DIAGNOSIS — J449 Chronic obstructive pulmonary disease, unspecified: Secondary | ICD-10-CM

## 2013-10-19 DIAGNOSIS — I10 Essential (primary) hypertension: Secondary | ICD-10-CM

## 2013-10-19 DIAGNOSIS — IMO0001 Reserved for inherently not codable concepts without codable children: Secondary | ICD-10-CM

## 2013-10-19 DIAGNOSIS — R269 Unspecified abnormalities of gait and mobility: Secondary | ICD-10-CM

## 2013-10-19 DIAGNOSIS — M25569 Pain in unspecified knee: Secondary | ICD-10-CM

## 2013-10-19 DIAGNOSIS — I69998 Other sequelae following unspecified cerebrovascular disease: Secondary | ICD-10-CM

## 2013-10-20 ENCOUNTER — Other Ambulatory Visit: Payer: Self-pay

## 2013-10-20 ENCOUNTER — Other Ambulatory Visit: Payer: Self-pay | Admitting: *Deleted

## 2013-10-20 DIAGNOSIS — E11610 Type 2 diabetes mellitus with diabetic neuropathic arthropathy: Secondary | ICD-10-CM

## 2013-10-20 MED ORDER — HYDROCODONE-ACETAMINOPHEN 7.5-325 MG PO TABS: 1.0000 | ORAL_TABLET | Freq: Two times a day (BID) | ORAL | Status: DC | PRN

## 2013-10-20 MED ORDER — GABAPENTIN 400 MG PO CAPS: 400.0000 mg | ORAL_CAPSULE | Freq: Every evening | ORAL | Status: DC

## 2013-10-20 NOTE — Telephone Encounter (Signed)
Last seen by Jacelyn Grip 09/04/13, last filled 09/18/13. Rx will print, call pt to pick up

## 2013-10-21 ENCOUNTER — Telehealth: Payer: Self-pay | Admitting: Family Medicine

## 2013-10-21 ENCOUNTER — Other Ambulatory Visit: Payer: Self-pay | Admitting: Family Medicine

## 2013-10-21 ENCOUNTER — Ambulatory Visit: Payer: PRIVATE HEALTH INSURANCE | Attending: Family Medicine | Admitting: *Deleted

## 2013-10-21 DIAGNOSIS — M6281 Muscle weakness (generalized): Secondary | ICD-10-CM | POA: Diagnosis not present

## 2013-10-21 DIAGNOSIS — IMO0001 Reserved for inherently not codable concepts without codable children: Secondary | ICD-10-CM | POA: Insufficient documentation

## 2013-10-21 DIAGNOSIS — I69998 Other sequelae following unspecified cerebrovascular disease: Secondary | ICD-10-CM | POA: Insufficient documentation

## 2013-10-21 DIAGNOSIS — I10 Essential (primary) hypertension: Secondary | ICD-10-CM | POA: Diagnosis not present

## 2013-10-21 DIAGNOSIS — R5381 Other malaise: Secondary | ICD-10-CM | POA: Diagnosis not present

## 2013-10-21 DIAGNOSIS — R262 Difficulty in walking, not elsewhere classified: Secondary | ICD-10-CM | POA: Insufficient documentation

## 2013-10-21 DIAGNOSIS — E11610 Type 2 diabetes mellitus with diabetic neuropathic arthropathy: Secondary | ICD-10-CM

## 2013-10-21 DIAGNOSIS — E119 Type 2 diabetes mellitus without complications: Secondary | ICD-10-CM | POA: Diagnosis not present

## 2013-10-21 MED ORDER — HYDROCODONE-ACETAMINOPHEN 7.5-325 MG PO TABS: 1.0000 | ORAL_TABLET | Freq: Two times a day (BID) | ORAL | Status: DC | PRN

## 2013-10-23 ENCOUNTER — Ambulatory Visit: Payer: PRIVATE HEALTH INSURANCE | Admitting: *Deleted

## 2013-10-23 DIAGNOSIS — IMO0001 Reserved for inherently not codable concepts without codable children: Secondary | ICD-10-CM | POA: Diagnosis not present

## 2013-10-28 ENCOUNTER — Ambulatory Visit: Payer: PRIVATE HEALTH INSURANCE | Admitting: Physical Therapy

## 2013-10-28 DIAGNOSIS — IMO0001 Reserved for inherently not codable concepts without codable children: Secondary | ICD-10-CM | POA: Diagnosis not present

## 2013-10-30 ENCOUNTER — Ambulatory Visit: Payer: PRIVATE HEALTH INSURANCE | Admitting: *Deleted

## 2013-10-30 DIAGNOSIS — IMO0001 Reserved for inherently not codable concepts without codable children: Secondary | ICD-10-CM | POA: Diagnosis not present

## 2013-11-04 ENCOUNTER — Ambulatory Visit: Payer: PRIVATE HEALTH INSURANCE | Admitting: *Deleted

## 2013-11-04 DIAGNOSIS — IMO0001 Reserved for inherently not codable concepts without codable children: Secondary | ICD-10-CM | POA: Diagnosis not present

## 2013-11-06 ENCOUNTER — Ambulatory Visit: Payer: PRIVATE HEALTH INSURANCE | Admitting: Physical Therapy

## 2013-11-06 DIAGNOSIS — IMO0001 Reserved for inherently not codable concepts without codable children: Secondary | ICD-10-CM | POA: Diagnosis not present

## 2013-11-12 ENCOUNTER — Ambulatory Visit: Payer: PRIVATE HEALTH INSURANCE | Admitting: Physical Therapy

## 2013-11-12 DIAGNOSIS — IMO0001 Reserved for inherently not codable concepts without codable children: Secondary | ICD-10-CM | POA: Diagnosis not present

## 2013-11-13 ENCOUNTER — Ambulatory Visit: Payer: PRIVATE HEALTH INSURANCE | Admitting: *Deleted

## 2013-11-13 DIAGNOSIS — IMO0001 Reserved for inherently not codable concepts without codable children: Secondary | ICD-10-CM | POA: Diagnosis not present

## 2013-11-17 ENCOUNTER — Other Ambulatory Visit: Payer: Self-pay | Admitting: Family Medicine

## 2013-11-17 DIAGNOSIS — E11610 Type 2 diabetes mellitus with diabetic neuropathic arthropathy: Secondary | ICD-10-CM

## 2013-11-19 ENCOUNTER — Ambulatory Visit (INDEPENDENT_AMBULATORY_CARE_PROVIDER_SITE_OTHER): Payer: Medicare Other | Admitting: Pharmacist

## 2013-11-19 ENCOUNTER — Encounter: Payer: Self-pay | Admitting: Family

## 2013-11-19 ENCOUNTER — Ambulatory Visit (INDEPENDENT_AMBULATORY_CARE_PROVIDER_SITE_OTHER): Payer: Medicare Other | Admitting: Family

## 2013-11-19 VITALS — BP 128/80 | HR 76 | Temp 99.5°F

## 2013-11-19 DIAGNOSIS — B372 Candidiasis of skin and nail: Secondary | ICD-10-CM

## 2013-11-19 DIAGNOSIS — I2510 Atherosclerotic heart disease of native coronary artery without angina pectoris: Secondary | ICD-10-CM

## 2013-11-19 DIAGNOSIS — Z7901 Long term (current) use of anticoagulants: Secondary | ICD-10-CM

## 2013-11-19 LAB — POCT INR: INR: 2.3

## 2013-11-19 MED ORDER — NYSTATIN-TRIAMCINOLONE 100000-0.1 UNIT/GM-% EX OINT: 1.0000 "application " | TOPICAL_OINTMENT | Freq: Two times a day (BID) | CUTANEOUS | Status: DC

## 2013-11-19 MED ORDER — FLUCONAZOLE 150 MG PO TABS: 150.0000 mg | ORAL_TABLET | Freq: Every day | ORAL | Status: DC

## 2013-11-19 NOTE — Progress Notes (Signed)
   Subjective:    Patient ID: Brittany Clarke, female    DOB: 09/09/1940, 73 y.o.   MRN: [id number]  Rash This is a recurrent problem. The current episode started more than 1 month ago. The problem has been waxing and waning since onset. The affected locations include the left arm and left buttock. The rash is characterized by itchiness, dryness and redness. She was exposed to nothing. Pertinent negatives include no congestion, cough, fatigue or joint pain. Past treatments include anti-itch cream. The treatment provided moderate relief.      Review of Systems  Constitutional: Negative for fatigue.  HENT: Negative for congestion.   Respiratory: Negative.  Negative for cough.   Cardiovascular: Negative.   Gastrointestinal: Negative.   Musculoskeletal: Negative for joint pain.  Skin: Positive for rash.  Hematological: Negative.   Psychiatric/Behavioral: Negative.   All other systems reviewed and are negative.      Objective:   Physical Exam  Vitals reviewed. Constitutional: She is oriented to person, place, and time. She appears well-developed and well-nourished. No distress.  Cardiovascular: Normal rate, regular rhythm, normal heart sounds and intact distal pulses.   No murmur heard. Pulmonary/Chest: Effort normal and breath sounds normal. No respiratory distress. She has no wheezes.  Abdominal: Soft. Bowel sounds are normal. She exhibits no distension. There is no tenderness.  Musculoskeletal: Normal range of motion. She exhibits no edema and no tenderness.  Neurological: She is alert and oriented to person, place, and time.  Skin: Skin is warm and dry. Rash noted. There is erythema.  Under right arm axillary erythemas, dry rash  Psychiatric: She has a normal mood and affect. Her behavior is normal. Judgment and thought content normal.    BP 128/80  Pulse 76  Temp(Src) 99.5 F (37.5 C) (Oral)      Assessment & Plan:  1. Candidiasis of skin -Keep area clean and  dry -Discussed possible reaction with diflucan and warfarin and bleeding risks - nystatin-triamcinolone ointment (MYCOLOG); Apply 1 application topically 2 (two) times daily.  Dispense: 30 g; Refill: 0 - fluconazole (DIFLUCAN) 150 MG tablet; Take 1 tablet (150 mg total) by mouth daily.  Dispense: 3 tablet; Refill: 0  Evelina Dun, FNP

## 2013-11-19 NOTE — Patient Instructions (Signed)
Candida Infection, Adult A candida infection (also called yeast, fungus and Monilia infection) is an overgrowth of yeast that can occur anywhere on the body. A yeast infection commonly occurs in warm, moist body areas. Usually, the infection remains localized but can spread to become a systemic infection. A yeast infection may be a sign of a more severe disease such as diabetes, leukemia, or AIDS. A yeast infection can occur in both men and women. In women, Candida vaginitis is a vaginal infection. It is one of the most common causes of vaginitis. Men usually do not have symptoms or know they have an infection until other problems develop. Men may find out they have a yeast infection because their sex partner has a yeast infection. Uncircumcised men are more likely to get a yeast infection than circumcised men. This is because the uncircumcised glans is not exposed to air and does not remain as dry as that of a circumcised glans. Older adults may develop yeast infections around dentures. CAUSES  Women  Antibiotics.  Steroid medication taken for a long time.  Being overweight (obese).  Diabetes.  Poor immune condition.  Certain serious medical conditions.  Immune suppressive medications for organ transplant patients.  Chemotherapy.  Pregnancy.  Menstration.  Stress and fatigue.  Intravenous drug use.  Oral contraceptives.  Wearing tight-fitting clothes in the crotch area.  Catching it from a sex partner who has a yeast infection.  Spermicide.  Intravenous, urinary, or other catheters. Men  Catching it from a sex partner who has a yeast infection.  Having oral or anal sex with a person who has the infection.  Spermicide.  Diabetes.  Antibiotics.  Poor immune system.  Medications that suppress the immune system.  Intravenous drug use.  Intravenous, urinary, or other catheters. SYMPTOMS  Women  Thick, white vaginal discharge.  Vaginal itching.  Redness and  swelling in and around the vagina.  Irritation of the lips of the vagina and perineum.  Blisters on the vaginal lips and perineum.  Painful sexual intercourse.  Low blood sugar (hypoglycemia).  Painful urination.  Bladder infections.  Intestinal problems such as constipation, indigestion, bad breath, bloating, increase in gas, diarrhea, or loose stools. Men  Men may develop intestinal problems such as constipation, indigestion, bad breath, bloating, increase in gas, diarrhea, or loose stools.  Dry, cracked skin on the penis with itching or discomfort.  Jock itch.  Dry, flaky skin.  Athlete's foot.  Hypoglycemia. DIAGNOSIS  Women  A history and an exam are performed.  The discharge may be examined under a microscope.  A culture may be taken of the discharge. Men  A history and an exam are performed.  Any discharge from the penis or areas of cracked skin will be looked at under the microscope and cultured.  Stool samples may be cultured. TREATMENT  Women  Vaginal antifungal suppositories and creams.  Medicated creams to decrease irritation and itching on the outside of the vagina.  Warm compresses to the perineal area to decrease swelling and discomfort.  Oral antifungal medications.  Medicated vaginal suppositories or cream for repeated or recurrent infections.  Wash and dry the irritation areas before applying the cream.  Eating yogurt with lactobacillus may help with prevention and treatment.  Sometimes painting the vagina with gentian violet solution may help if creams and suppositories do not work. Men  Antifungal creams and oral antifungal medications.  Sometimes treatment must continue for 30 days after the symptoms go away to prevent recurrence. HOME CARE  INSTRUCTIONS  Women  Use cotton underwear and avoid tight-fitting clothing.  Avoid colored, scented toilet paper and deodorant tampons or pads.  Do not douche.  Keep your diabetes  under control.  Finish all the prescribed medications.  Keep your skin clean and dry.  Consume milk or yogurt with lactobacillus active culture regularly. If you get frequent yeast infections and think that is what the infection is, there are over-the-counter medications that you can get. If the infection does not show healing in 3 days, talk to your caregiver.  Tell your sex partner you have a yeast infection. Your partner may need treatment also, especially if your infection does not clear up or recurs. Men  Keep your skin clean and dry.  Keep your diabetes under control.  Finish all prescribed medications.  Tell your sex partner that you have a yeast infection so they can be treated if necessary. SEEK MEDICAL CARE IF:   Your symptoms do not clear up or worsen in one week after treatment.  You have an oral temperature above 102 F (38.9 C).  You have trouble swallowing or eating for a prolonged time.  You develop blisters on and around your vagina.  You develop vaginal bleeding and it is not your menstrual period.  You develop abdominal pain.  You develop intestinal problems as mentioned above.  You get weak or lightheaded.  You have painful or increased urination.  You have pain during sexual intercourse. MAKE SURE YOU:   Understand these instructions.  Will watch your condition.  Will get help right away if you are not doing well or get worse. Document Released: 06/15/2004 Document Revised: 07/31/2011 Document Reviewed: 09/27/2009 [facility] Patient Information 2015 [place], [place]. This information is not intended to replace advice given to you by your health care provider. Make sure you discuss any questions you have with your health care provider.

## 2013-11-19 NOTE — Progress Notes (Signed)
Patient triage to [facility] for evaluation of Rash

## 2013-11-19 NOTE — Patient Instructions (Signed)
Anticoagulation Dose Instructions as of 11/19/2013     Brittany Clarke Tue Wed Thu Fri Sat   New Dose 5 mg 2.5 mg 5 mg 2.5 mg 5 mg 2.5 mg 5 mg    Description       Continue to take warfarin 1/2 tablet on mondays, wednesdays and fridays and 1 tablet all other days.        INR was 2.3 today

## 2013-11-24 MED ORDER — HYDROCODONE-ACETAMINOPHEN 7.5-325 MG PO TABS: 1.0000 | ORAL_TABLET | Freq: Two times a day (BID) | ORAL | Status: DC | PRN

## 2013-11-24 NOTE — Telephone Encounter (Signed)
Last seen 11/19/13 C Hawks  If approved print and route to nurse

## 2013-11-24 NOTE — Telephone Encounter (Signed)
Last filled 10/21/13, last seen 11/19/13, was wongs pt

## 2013-11-24 NOTE — Telephone Encounter (Signed)
Pt aware Rx at front to pickup

## 2013-12-02 ENCOUNTER — Telehealth: Payer: Self-pay | Admitting: Family

## 2013-12-03 NOTE — Telephone Encounter (Signed)
Patient aware note put in the schedule on the day of appt about needing physical therapy

## 2013-12-05 ENCOUNTER — Ambulatory Visit (INDEPENDENT_AMBULATORY_CARE_PROVIDER_SITE_OTHER): Payer: Medicare Other | Admitting: Family Medicine

## 2013-12-05 ENCOUNTER — Encounter: Payer: Self-pay | Admitting: Family Medicine

## 2013-12-05 VITALS — BP 124/82 | HR 83 | Temp 99.1°F

## 2013-12-05 DIAGNOSIS — I10 Essential (primary) hypertension: Secondary | ICD-10-CM

## 2013-12-05 DIAGNOSIS — E119 Type 2 diabetes mellitus without complications: Secondary | ICD-10-CM

## 2013-12-05 DIAGNOSIS — Z23 Encounter for immunization: Secondary | ICD-10-CM

## 2013-12-05 LAB — POCT GLYCOSYLATED HEMOGLOBIN (HGB A1C): Hemoglobin A1C: 5.9

## 2013-12-05 MED ORDER — O-RING CUSHION MISC: 1.0000 | Status: DC | PRN

## 2013-12-05 MED ORDER — O-RING CUSHION MISC
1.0000 | Status: DC | PRN
Start: 2013-12-05 — End: 2013-12-05

## 2013-12-05 NOTE — Patient Instructions (Addendum)
Pneumococcal Vaccine, Polyvalent suspension for injection What is this medicine? PNEUMOCOCCAL VACCINE, POLYVALENT (NEU mo KOK al vak SEEN, pol ee VEY luhnt) is a vaccine to prevent pneumococcus bacteria infection. These bacteria are a major cause of ear infections, 'Strep throat' infections, and serious pneumonia, meningitis, or blood infections worldwide. These vaccines help the body to produce antibodies (protective substances) that help your body defend against these bacteria. This vaccine is recommended for infants and young children. This vaccine will not treat an infection. This medicine may be used for other purposes; ask your health care provider or pharmacist if you have questions. COMMON BRAND NAME(S): Prevnar 13 What should I tell my health care provider before I take this medicine? They need to know if you have any of these conditions: -bleeding problems -fever -immune system problems -low platelet count in the blood -seizures -an unusual or allergic reaction to pneumococcal vaccine, diphtheria toxoid, other vaccines, latex, other medicines, foods, dyes, or preservatives -pregnant or trying to get pregnant -breast-feeding How should I use this medicine? This vaccine is for injection into a muscle. It is given by a health care professional. A copy of Vaccine Information Statements will be given before each vaccination. Read this sheet carefully each time. The sheet may change frequently. Talk to your pediatrician regarding the use of this medicine in children. While this drug may be prescribed for children as young as 38 weeks old for selected conditions, precautions do apply. Overdosage: If you think you have taken too much of this medicine contact a poison control center or emergency room at once. NOTE: This medicine is only for you. Do not share this medicine with others. What if I miss a dose? It is important not to miss your dose. Call your doctor or health care professional if  you are unable to keep an appointment. What may interact with this medicine? -medicines for cancer chemotherapy -medicines that suppress your immune function -medicines that treat or prevent blood clots like warfarin, enoxaparin, and dalteparin -steroid medicines like prednisone or cortisone This list may not describe all possible interactions. Give your health care provider a list of all the medicines, herbs, non-prescription drugs, or dietary supplements you use. Also tell them if you smoke, drink alcohol, or use illegal drugs. Some items may interact with your medicine. What should I watch for while using this medicine? Mild fever and pain should go away in 3 days or less. Report any unusual symptoms to your doctor or health care professional. What side effects may I notice from receiving this medicine? Side effects that you should report to your doctor or health care professional as soon as possible: -allergic reactions like skin rash, itching or hives, swelling of the face, lips, or tongue -breathing problems -confused -fever over 102 degrees F -pain, tingling, numbness in the hands or feet -seizures -unusual bleeding or bruising -unusual muscle weakness Side effects that usually do not require medical attention (report to your doctor or health care professional if they continue or are bothersome): -aches and pains -diarrhea -fever of 102 degrees F or less -headache -irritable -loss of appetite -pain, tender at site where injected -trouble sleeping This list may not describe all possible side effects. Call your doctor for medical advice about side effects. You may report side effects to FDA at 1-800-FDA-1088. Where should I keep my medicine? This does not apply. This vaccine is given in a clinic, pharmacy, doctor's office, or other health care setting and will not be stored at home. NOTE:  This sheet is a summary. It may not cover all possible information. If you have questions  about this medicine, talk to your doctor, pharmacist, or health care provider.  2015, [employer] Standard. (2008-07-21 10:17:22)   Tetanus, Diphtheria, Pertussis (Tdap) Vaccine What You Need to Know WHY GET VACCINATED? Tetanus, diphtheria and pertussis can be very serious diseases, even for adolescents and adults. Tdap vaccine can protect [place] from these diseases. TETANUS (Lockjaw) causes painful muscle tightening and stiffness, usually all over the body.  It can lead to tightening of muscles in the head and neck so you can't open your mouth, swallow, or sometimes even breathe. Tetanus kills about 1 out of 5 people who are infected. DIPHTHERIA can cause a thick coating to form in the back of the throat.  It can lead to breathing problems, paralysis, heart failure, and death. PERTUSSIS (Whooping Cough) causes severe coughing spells, which can cause difficulty breathing, vomiting and disturbed sleep.  It can also lead to weight loss, incontinence, and rib fractures. Up to 2 in 100 adolescents and 5 in 100 adults with pertussis are hospitalized or have complications, which could include pneumonia and death. These diseases are caused by bacteria. Diphtheria and pertussis are spread from person to person through coughing or sneezing. Tetanus enters the body through cuts, scratches, or wounds. Before vaccines, the [place] saw as many as 200,000 cases a year of diphtheria and pertussis, and hundreds of cases of tetanus. Since vaccination began, tetanus and diphtheria have dropped by about 99% and pertussis by about 80%. TDAP VACCINE Tdap vaccine can protect adolescents and adults from tetanus, diphtheria, and pertussis. One dose of Tdap is routinely given at age 75 or 88. People who did not get Tdap at that age should get it as soon as possible. Tdap is especially important for health care professionals and anyone having close contact with a baby younger than 12 months. Pregnant women should  get a dose of Tdap during every pregnancy, to protect the newborn from pertussis. Infants are most at risk for severe, life-threatening complications from pertussis. A similar vaccine, called Td, protects from tetanus and diphtheria, but not pertussis. A Td booster should be given every 10 years. Tdap may be given as one of these boosters if you have not already gotten a dose. Tdap may also be given after a severe cut or burn to prevent tetanus infection. Your doctor can give you more information. Tdap may safely be given at the same time as other vaccines. SOME PEOPLE SHOULD NOT GET THIS VACCINE  If you ever had a life-threatening allergic reaction after a dose of any tetanus, diphtheria, or pertussis containing vaccine, OR if you have a severe allergy to any part of this vaccine, you should not get Tdap. Tell your doctor if you have any severe allergies.  If you had a coma, or long or multiple seizures within 7 days after a childhood dose of DTP or DTaP, you should not get Tdap, unless a cause other than the vaccine was found. You can still get Td.  Talk to your doctor if you:  have epilepsy or another nervous system problem,  had severe pain or swelling after any vaccine containing diphtheria, tetanus or pertussis,  ever had Guillain-Barr Syndrome (GBS),  aren't feeling well on the day the shot is scheduled. RISKS OF A VACCINE REACTION With any medicine, including vaccines, there is a chance of side effects. These are usually mild and go away on their own, but serious reactions  are also possible. Brief fainting spells can follow a vaccination, leading to injuries from falling. Sitting or lying down for about 15 minutes can help prevent these. Tell your doctor if you feel dizzy or light-headed, or have vision changes or ringing in the ears. Mild problems following Tdap (Did not interfere with activities)  Pain where the shot was given (about 3 in 4 adolescents or 2 in 3 adults)  Redness  or swelling where the shot was given (about 1 person in 5)  Mild fever of at least 100.49F (up to about 1 in 25 adolescents or 1 in 100 adults)  Headache (about 3 or 4 people in 10)  Tiredness (about 1 person in 3 or 4)  Nausea, vomiting, diarrhea, stomach ache (up to 1 in 4 adolescents or 1 in 10 adults)  Chills, body aches, sore joints, rash, swollen glands (uncommon) Moderate problems following Tdap (Interfered with activities, but did not require medical attention)  Pain where the shot was given (about 1 in 5 adolescents or 1 in 100 adults)  Redness or swelling where the shot was given (up to about 1 in 16 adolescents or 1 in 25 adults)  Fever over 102F (about 1 in 100 adolescents or 1 in 250 adults)  Headache (about 3 in 20 adolescents or 1 in 10 adults)  Nausea, vomiting, diarrhea, stomach ache (up to 1 or 3 people in 100)  Swelling of the entire arm where the shot was given (up to about 3 in 100). Severe problems following Tdap (Unable to perform usual activities, required medical attention)  Swelling, severe pain, bleeding and redness in the arm where the shot was given (rare). A severe allergic reaction could occur after any vaccine (estimated less than 1 in a million doses). WHAT IF THERE IS A SERIOUS REACTION? What should I look for?  Look for anything that concerns you, such as signs of a severe allergic reaction, very high fever, or behavior changes. Signs of a severe allergic reaction can include hives, swelling of the face and throat, difficulty breathing, a fast heartbeat, dizziness, and weakness. These would start a few minutes to a few hours after the vaccination. What should I do?  If you think it is a severe allergic reaction or other emergency that can't wait, call 9-1-1 or get the person to the nearest hospital. Otherwise, call your doctor.  Afterward, the reaction should be reported to the "Vaccine Adverse Event Reporting System" (VAERS). Your doctor might  file this report, or you can do it yourself through the VAERS web site at www.vaers.SamedayNews.es, or by calling 201 145 1079. VAERS is only for reporting reactions. They do not give medical advice.  THE NATIONAL VACCINE INJURY COMPENSATION PROGRAM The National Vaccine Injury Compensation Program (VICP) is a federal program that was created to compensate people who may have been injured by certain vaccines. Persons who believe they may have been injured by a vaccine can learn about the program and about filing a claim by calling (780)248-6685 or visiting the [employer] website at GoldCloset.com.ee. HOW CAN I LEARN MORE?  Ask your doctor.  Call your local or state health department.  Contact the Centers for Disease Control and Prevention (CDC):  Call 567 719 4379 or visit CDC's website at http://hunter.com/. CDC Tdap Vaccine VIS (09/28/11) Document Released: 11/07/2011 Document Revised: 09/02/2012 Document Reviewed: 08/28/2012 [employer] Patient Information 2015 [place], [place]. This information is not intended to replace advice given to you by your health care provider. Make sure you discuss any questions you have  with your health care provider.   Pressure Ulcer A pressure ulcer is a sore that has formed from the breakdown of skin and exposure of deeper layers of tissue. It develops in areas of the body where there is unrelieved pressure. Pressure ulcers are usually found over a bony area, such as the shoulder blades, spine, lower back, hips, knees, ankles, and heels. Pressure ulcers vary in severity. Your health care provider may determine the severity (stage) of your pressure ulcer. The stages include:  Stage I--The skin is red, and when the skin is pressed, it stays red.  Stage II--The top layer of skin is gone, and there is a shallow, pink ulcer.  Stage III--The ulcer becomes deeper, and it is more difficult to see the whole wound. Also, there may be yellow or brown parts, as  well as pink and red parts.  Stage IV--The ulcer may be deep and red, pink, brown, white, or yellow. Bone or muscle may be seen.  Unstageable pressure ulcer--The ulcer is covered almost completely with black, brown, or yellow tissue. It is not known how deep the ulcer is or what stage it is until this covering comes off.  Suspected deep tissue injury--A person's skin can be injured from pressure or pulling on the skin when his or her position is changed. The skin appears purple or maroon. There may not be an opening in the skin, but there could be a blood-filled blister. This deep tissue injury is often difficult to see in people with darker skin tones. The site may open and become deeper in time. However, early interventions will help the area heal and may prevent the area from opening. CAUSES  Pressure ulcers are caused by pressure against the skin that limits the flow of blood to the skin and nearby tissues. There are many risk factors that can lead to pressure sores. RISK FACTORS  Decreased ability to move.  Decreased ability to feel pain or discomfort.  Excessive skin moisture from urine, stool, sweat, or secretions.  Poor nutrition.  Dehydration.  Tobacco, drug, or alcohol abuse.  Having someone pull on bedsheets that are under you, such as when health care workers are changing your position in a hospital bed.  Obesity.  Increased adult age.  Hospitalization in a critical care unit for longer than 4 days with use of medical devices.  Prolonged use of medical devices.  Critical illness.  Anemia.  Traumatic brain injury.  Spinal cord injury.  Stroke.  Diabetes.  Poor blood glucose control.  Low blood pressure (hypotension).  Low oxygen levels.  Medicines that reduce blood flow.  Infection. DIAGNOSIS  Your health care provider will diagnose your pressure ulcer based on its appearance. The health care provider may determine the stage of your pressure ulcer as  well. Tests may be done to check for infection, to assess your circulation, or to check for other diseases, such as diabetes. TREATMENT  Treatment of your pressure ulcer begins with determining what stage the ulcer is in. Your treatment team may include your health care provider, a wound care specialist, a nutritionist, a physical therapist, and a Psychologist, sport and exercise. Possible treatments may include:   Moving or repositioning every 1-2 hours.  Using beds or mattresses to shift your body weight and pressure points frequently.  Improving your diet.  Cleaning and bandaging (dressing) the open wound.  Giving antibiotic medicines.  Removing damaged tissue.  Surgery and sometimes skin grafts. HOME CARE INSTRUCTIONS  If you were hospitalized, follow the care  plan that was started in the hospital.  Avoid staying in the same position for more than 2 hours. Use padding, devices, or mattresses to cushion your pressure points as directed by your health care provider.  Eat a well-balanced diet. Take nutritional supplements and vitamins as directed by your health care provider.  Keep all follow-up appointments.  Only take over-the-counter or prescription medicines for pain, fever, or discomfort as directed by your health care provider. SEEK MEDICAL CARE IF:   Your pressure ulcer is not improving.  You do not know how to care for your pressure ulcer.  You notice other areas of redness on your skin.  You have a fever. SEEK IMMEDIATE MEDICAL CARE IF:   You have increasing redness, swelling, or pain in your pressure ulcer.  You notice pus coming from your pressure ulcer.  You notice a bad smell coming from the wound or dressing.  Your pressure ulcer opens up again. Document Released: 05/08/2005 Document Revised: 05/13/2013 Document Reviewed: 01/13/2013 [facility] Patient Information 2015 [place], [place]. This information is not intended to replace advice given to you by your health care provider. Make  sure you discuss any questions you have with your health care provider. Diabetes and Foot Care Diabetes may cause you to have problems because of poor blood supply (circulation) to your feet and legs. This may cause the skin on your feet to become thinner, break easier, and heal more slowly. Your skin may become dry, and the skin may peel and crack. You may also have nerve damage in your legs and feet causing decreased feeling in them. You may not notice minor injuries to your feet that could lead to infections or more serious problems. Taking care of your feet is one of the most important things you can do for yourself.  HOME CARE INSTRUCTIONS  Wear shoes at all times, even in the house. Do not go barefoot. Bare feet are easily injured.  Check your feet daily for blisters, cuts, and redness. If you cannot see the bottom of your feet, use a mirror or ask someone for help.  Wash your feet with warm water (do not use hot water) and mild soap. Then pat your feet and the areas between your toes until they are completely dry. Do not soak your feet as this can dry your skin.  Apply a moisturizing lotion or petroleum jelly (that does not contain alcohol and is unscented) to the skin on your feet and to dry, brittle toenails. Do not apply lotion between your toes.  Trim your toenails straight across. Do not dig under them or around the cuticle. File the edges of your nails with an emery board or nail file.  Do not cut corns or calluses or try to remove them with medicine.  Wear clean socks or stockings every day. Make sure they are not too tight. Do not wear knee-high stockings since they may decrease blood flow to your legs.  Wear shoes that fit properly and have enough cushioning. To break in new shoes, wear them for just a few hours a day. This prevents you from injuring your feet. Always look in your shoes before you put them on to be sure there are no objects inside.  Do not cross your legs. This  may decrease the blood flow to your feet.  If you find a minor scrape, cut, or break in the skin on your feet, keep it and the skin around it clean and dry. These areas may be  cleansed with mild soap and water. Do not cleanse the area with peroxide, alcohol, or iodine.  When you remove an adhesive bandage, be sure not to damage the skin around it.  If you have a wound, look at it several times a day to make sure it is healing.  Do not use heating pads or hot water bottles. They may burn your skin. If you have lost feeling in your feet or legs, you may not know it is happening until it is too late.  Make sure your health care provider performs a complete foot exam at least annually or more often if you have foot problems. Report any cuts, sores, or bruises to your health care provider immediately. SEEK MEDICAL CARE IF:   You have an injury that is not healing.  You have cuts or breaks in the skin.  You have an ingrown nail.  You notice redness on your legs or feet.  You feel burning or tingling in your legs or feet.  You have pain or cramps in your legs and feet.  Your legs or feet are numb.  Your feet always feel cold. SEEK IMMEDIATE MEDICAL CARE IF:   There is increasing redness, swelling, or pain in or around a wound.  There is a red line that goes up your leg.  Pus is coming from a wound.  You develop a fever or as directed by your health care provider.  You notice a bad smell coming from an ulcer or wound. Document Released: 05/05/2000 Document Revised: 01/08/2013 Document Reviewed: 10/15/2012 [facility] Patient Information 2015 [place], [place]. This information is not intended to replace advice given to you by your health care provider. Make sure you discuss any questions you have with your health care provider.

## 2013-12-05 NOTE — Progress Notes (Signed)
   Subjective:    Patient ID: Brittany Clarke, female    DOB: Oct 15, 1940, 73 y.o.   MRN: [id number]  HPI    Review of Systems  Constitutional: Negative.   HENT: Negative.   Eyes: Negative.   Respiratory: Negative.   Cardiovascular: Negative.   Gastrointestinal: Negative.   Endocrine: Negative.   Genitourinary: Negative.   Musculoskeletal: Negative.   Skin: Negative.   Hematological: Negative.   Psychiatric/Behavioral: Negative.        Objective:   Physical Exam  Constitutional: She is oriented to person, place, and time. She appears well-developed and well-nourished.  Eyes: Conjunctivae and EOM are normal.  Neck: Normal range of motion. Neck supple.  Cardiovascular: Normal rate, regular rhythm and normal heart sounds.   Pulmonary/Chest: Effort normal and breath sounds normal.  Abdominal: Soft. Bowel sounds are normal.  Musculoskeletal: Normal range of motion.  Neurological: She is alert and oriented to person, place, and time. She has normal reflexes.  Skin: Skin is warm and dry.  Minimal soft L heel  R axilla healing Sacrum has no breakdown  Psychiatric: She has a normal mood and affect. Her behavior is normal. Thought content normal.          Assessment & Plan:  1. Type 2 diabetes mellitus without complication Takes only Metformin and A1C in good range - POCT glycosylated hemoglobin (Hb A1C) - BMP8+EGFR  2. Essential hypertension BP well controlled on Losartan and metoprolol

## 2013-12-06 LAB — BMP8+EGFR
BUN/Creatinine Ratio: 24 (ref 11–26)
BUN: 12 mg/dL (ref 8–27)
CHLORIDE: 101 mmol/L (ref 97–108)
CO2: 25 mmol/L (ref 18–29)
CREATININE: 0.5 mg/dL — AB (ref 0.57–1.00)
Calcium: 9.5 mg/dL (ref 8.7–10.3)
GFR calc non Af Amer: 98 mL/min/{1.73_m2} (ref >59)
GFR, EST AFRICAN AMERICAN: 113 mL/min/{1.73_m2} (ref >59)
GLUCOSE: 112 mg/dL — AB (ref 65–99)
Potassium: 5.1 mmol/L (ref 3.5–5.2)
Sodium: 143 mmol/L (ref 134–144)

## 2013-12-18 ENCOUNTER — Other Ambulatory Visit: Payer: Self-pay | Admitting: Family Medicine

## 2013-12-19 ENCOUNTER — Telehealth: Payer: Self-pay | Admitting: *Deleted

## 2013-12-19 NOTE — Telephone Encounter (Signed)
Notified daughter that request for pain meds will be sent to Dr Sabra Heck for review Will notify when RX is ready for pickup

## 2013-12-24 ENCOUNTER — Other Ambulatory Visit: Payer: Self-pay | Admitting: Family Medicine

## 2013-12-24 DIAGNOSIS — E11610 Type 2 diabetes mellitus with diabetic neuropathic arthropathy: Secondary | ICD-10-CM

## 2013-12-24 MED ORDER — HYDROCODONE-ACETAMINOPHEN 7.5-325 MG PO TABS: 1.0000 | ORAL_TABLET | Freq: Two times a day (BID) | ORAL | Status: DC | PRN

## 2013-12-24 NOTE — Telephone Encounter (Signed)
Ready to pick up.  

## 2013-12-25 ENCOUNTER — Ambulatory Visit (INDEPENDENT_AMBULATORY_CARE_PROVIDER_SITE_OTHER): Payer: Medicare Other | Admitting: Pharmacist

## 2013-12-25 DIAGNOSIS — Z7901 Long term (current) use of anticoagulants: Secondary | ICD-10-CM

## 2013-12-25 DIAGNOSIS — I6350 Cerebral infarction due to unspecified occlusion or stenosis of unspecified cerebral artery: Secondary | ICD-10-CM

## 2013-12-25 DIAGNOSIS — B372 Candidiasis of skin and nail: Secondary | ICD-10-CM

## 2013-12-25 LAB — POCT INR: INR: 1.6

## 2013-12-25 MED ORDER — NYSTATIN-TRIAMCINOLONE 100000-0.1 UNIT/GM-% EX OINT: 1.0000 "application " | TOPICAL_OINTMENT | Freq: Two times a day (BID) | CUTANEOUS | Status: DC

## 2013-12-25 NOTE — Patient Instructions (Signed)
Anticoagulation Dose Instructions as of 12/25/2013     Dorene Grebe Tue Wed Thu Fri Sat   New Dose 5 mg 2.5 mg 5 mg 2.5 mg 5 mg 2.5 mg 5 mg    Description       Take 1/2 tablet of warfarin when you get home, then take 1 tablet at usually time at 4pm.  Will then restart your regular warfarin 5mg  dose 1/2 tablet on mondays, wednesdays and fridays and 1 tablet all other days.        INR was 1.6 today

## 2014-01-07 ENCOUNTER — Encounter: Payer: Self-pay | Admitting: *Deleted

## 2014-01-08 ENCOUNTER — Ambulatory Visit (INDEPENDENT_AMBULATORY_CARE_PROVIDER_SITE_OTHER): Payer: Medicare Other | Admitting: Nurse Practitioner

## 2014-01-08 DIAGNOSIS — Z7901 Long term (current) use of anticoagulants: Secondary | ICD-10-CM

## 2014-01-08 DIAGNOSIS — I2510 Atherosclerotic heart disease of native coronary artery without angina pectoris: Secondary | ICD-10-CM

## 2014-01-08 LAB — POCT INR: INR: 2.5

## 2014-01-08 NOTE — Patient Instructions (Signed)
Anticoagulation Dose Instructions as of 01/08/2014     Brittany Clarke Tue Wed Thu Fri Sat   New Dose 5 mg 2.5 mg 5 mg 2.5 mg 5 mg 2.5 mg 5 mg    Description       Continue current dose and recheck in 4 weeks     Recheck in 4 weeks

## 2014-01-22 ENCOUNTER — Telehealth: Payer: Self-pay | Admitting: Family Medicine

## 2014-01-22 DIAGNOSIS — E11610 Type 2 diabetes mellitus with diabetic neuropathic arthropathy: Secondary | ICD-10-CM

## 2014-01-23 MED ORDER — HYDROCODONE-ACETAMINOPHEN 7.5-325 MG PO TABS: 1.0000 | ORAL_TABLET | Freq: Two times a day (BID) | ORAL | Status: DC | PRN

## 2014-01-23 NOTE — Telephone Encounter (Signed)
rx ready to be picked up

## 2014-01-23 NOTE — Telephone Encounter (Signed)
Call patient to pick up

## 2014-01-30 ENCOUNTER — Encounter: Payer: Self-pay | Admitting: *Deleted

## 2014-02-05 ENCOUNTER — Ambulatory Visit (INDEPENDENT_AMBULATORY_CARE_PROVIDER_SITE_OTHER): Payer: Medicare Other | Admitting: Pharmacist

## 2014-02-05 DIAGNOSIS — Z7901 Long term (current) use of anticoagulants: Secondary | ICD-10-CM

## 2014-02-05 DIAGNOSIS — B372 Candidiasis of skin and nail: Secondary | ICD-10-CM

## 2014-02-05 DIAGNOSIS — I2510 Atherosclerotic heart disease of native coronary artery without angina pectoris: Secondary | ICD-10-CM

## 2014-02-05 LAB — POCT INR: INR: 2.7

## 2014-02-05 MED ORDER — NYSTATIN-TRIAMCINOLONE 100000-0.1 UNIT/GM-% EX OINT: 1.0000 "application " | TOPICAL_OINTMENT | Freq: Two times a day (BID) | CUTANEOUS | Status: DC

## 2014-02-05 NOTE — Patient Instructions (Signed)
Anticoagulation Dose Instructions as of 02/05/2014     Brittany Clarke Tue Wed Thu Fri Sat   New Dose 5 mg 2.5 mg 5 mg 2.5 mg 5 mg 2.5 mg 5 mg    Description       Continue warfarin 5mg  1/2 tablet on mondays, wednesdays and fridays and 1 tablet all other days.      INR was 2.7 today

## 2014-02-20 ENCOUNTER — Telehealth: Payer: Self-pay | Admitting: Family Medicine

## 2014-02-20 DIAGNOSIS — E11610 Type 2 diabetes mellitus with diabetic neuropathic arthropathy: Secondary | ICD-10-CM

## 2014-02-23 ENCOUNTER — Other Ambulatory Visit: Payer: Self-pay | Admitting: Family Medicine

## 2014-02-23 ENCOUNTER — Other Ambulatory Visit: Payer: Self-pay | Admitting: Family

## 2014-02-23 ENCOUNTER — Telehealth: Payer: Self-pay | Admitting: Family Medicine

## 2014-02-23 MED ORDER — HYDROCODONE-ACETAMINOPHEN 7.5-325 MG PO TABS: 1.0000 | ORAL_TABLET | Freq: Two times a day (BID) | ORAL | Status: DC | PRN

## 2014-02-23 NOTE — Telephone Encounter (Signed)
Dr. Sabra Heck will not be back till Wed. Will u fill

## 2014-02-24 NOTE — Telephone Encounter (Signed)
Patient aware to pick up 

## 2014-02-24 NOTE — Telephone Encounter (Signed)
Okay to refill; I'll sign tomorrow

## 2014-02-25 NOTE — Telephone Encounter (Signed)
If patient has used that much in just 3 weeks she might need to see PCP to make sure she doesn't need alternate therapy

## 2014-02-25 NOTE — Telephone Encounter (Signed)
Looks like this was filled on 02-05-14. Requesting refill. Please advise

## 2014-02-25 NOTE — Telephone Encounter (Signed)
Please review previous notes. Patient is requesting refill on this. Please advise

## 2014-02-26 ENCOUNTER — Other Ambulatory Visit: Payer: Self-pay | Admitting: Family Medicine

## 2014-02-27 ENCOUNTER — Telehealth: Payer: Self-pay | Admitting: Family Medicine

## 2014-02-27 DIAGNOSIS — B372 Candidiasis of skin and nail: Secondary | ICD-10-CM

## 2014-02-27 MED ORDER — NYSTATIN 100000 UNIT/GM EX CREA: TOPICAL_CREAM | CUTANEOUS | Status: DC

## 2014-02-27 MED ORDER — NYSTATIN-TRIAMCINOLONE 100000-0.1 UNIT/GM-% EX OINT: 1.0000 "application " | TOPICAL_OINTMENT | Freq: Two times a day (BID) | CUTANEOUS | Status: DC

## 2014-02-27 NOTE — Telephone Encounter (Signed)
I sent the Rx to the pharmacy.

## 2014-03-10 ENCOUNTER — Ambulatory Visit: Payer: Self-pay | Admitting: Family Medicine

## 2014-03-11 ENCOUNTER — Other Ambulatory Visit: Payer: Self-pay | Admitting: Family Medicine

## 2014-03-12 ENCOUNTER — Encounter: Payer: Self-pay | Admitting: Family Medicine

## 2014-03-12 ENCOUNTER — Ambulatory Visit (INDEPENDENT_AMBULATORY_CARE_PROVIDER_SITE_OTHER): Payer: Medicare Other | Admitting: Family Medicine

## 2014-03-12 VITALS — BP 110/73 | HR 76 | Temp 98.8°F

## 2014-03-12 DIAGNOSIS — E119 Type 2 diabetes mellitus without complications: Secondary | ICD-10-CM

## 2014-03-12 DIAGNOSIS — I509 Heart failure, unspecified: Secondary | ICD-10-CM

## 2014-03-12 DIAGNOSIS — I10 Essential (primary) hypertension: Secondary | ICD-10-CM

## 2014-03-12 DIAGNOSIS — Z23 Encounter for immunization: Secondary | ICD-10-CM

## 2014-03-12 LAB — POCT GLYCOSYLATED HEMOGLOBIN (HGB A1C): HEMOGLOBIN A1C: 6

## 2014-03-12 MED ORDER — AMOXICILLIN 500 MG PO CAPS: 500.0000 mg | ORAL_CAPSULE | Freq: Three times a day (TID) | ORAL | Status: DC

## 2014-03-12 NOTE — Patient Instructions (Signed)
Otalgia  The most common reason for this in children is an infection of the middle ear. Pain from the middle ear is usually caused by a build-up of fluid and pressure behind the eardrum. Pain from an earache can be sharp, dull, or burning. The pain may be temporary or constant. The middle ear is connected to the nasal passages by a short narrow tube called the Eustachian tube. The Eustachian tube allows fluid to drain out of the middle ear, and helps keep the pressure in your ear equalized.  CAUSES   A cold or allergy can block the Eustachian tube with inflammation and the build-up of secretions. This is especially likely in small children, because their Eustachian tube is shorter and more horizontal. When the Eustachian tube closes, the normal flow of fluid from the middle ear is stopped. Fluid can accumulate and cause stuffiness, pain, hearing loss, and an ear infection if germs start growing in this area.  SYMPTOMS   The symptoms of an ear infection may include fever, ear pain, fussiness, increased crying, and irritability. Many children will have temporary and minor hearing loss during and right after an ear infection. Permanent hearing loss is rare, but the risk increases the more infections a child has. Other causes of ear pain include retained water in the outer ear canal from swimming and bathing.  Ear pain in adults is less likely to be from an ear infection. Ear pain may be referred from other locations. Referred pain may be from the joint between your jaw and the skull. It may also come from a tooth problem or problems in the neck. Other causes of ear pain include:   A foreign body in the ear.   Outer ear infection.   Sinus infections.   Impacted ear wax.   Ear injury.   Arthritis of the jaw or TMJ problems.   Middle ear infection.   Tooth infections.   Sore throat with pain to the ears.  DIAGNOSIS   Your caregiver can usually make the diagnosis by examining you. Sometimes other special studies,  including x-rays and lab work may be necessary.  TREATMENT    If antibiotics were prescribed, use them as directed and finish them even if you or your child's symptoms seem to be improved.   Sometimes PE tubes are needed in children. These are little plastic tubes which are put into the eardrum during a simple surgical procedure. They allow fluid to drain easier and allow the pressure in the middle ear to equalize. This helps relieve the ear pain caused by pressure changes.  HOME CARE INSTRUCTIONS    Only take over-the-counter or prescription medicines for pain, discomfort, or fever as directed by your caregiver. DO NOT GIVE CHILDREN ASPIRIN because of the association of Reye's Syndrome in children taking aspirin.   Use a cold pack applied to the outer ear for 15-20 minutes, 03-04 times per day or as needed may reduce pain. Do not apply ice directly to the skin. You may cause frost bite.   Over-the-counter ear drops used as directed may be effective. Your caregiver may sometimes prescribe ear drops.   Resting in an upright position may help reduce pressure in the middle ear and relieve pain.   Ear pain caused by rapidly descending from high altitudes can be relieved by swallowing or chewing gum. Allowing infants to suck on a bottle during airplane travel can help.   Do not smoke in the house or near children. If you are   unable to quit smoking, smoke outside.   Control allergies.  SEEK IMMEDIATE MEDICAL CARE IF:    You or your child are becoming sicker.   Pain or fever relief is not obtained with medicine.   You or your child's symptoms (pain, fever, or irritability) do not improve within 24 to 48 hours or as instructed.   Severe pain suddenly stops hurting. This may indicate a ruptured eardrum.   You or your children develop new problems such as severe headaches, stiff neck, difficulty swallowing, or swelling of the face or around the ear.  Document Released: 12/24/2003 Document Revised: 07/31/2011  Document Reviewed: 04/29/2008  Exit[employer] Patient Information 2015 [employer]. This information is not intended to replace advice given to you by your health care provider. Make sure you discuss any questions you have with your health care provider.

## 2014-03-12 NOTE — Progress Notes (Signed)
   Subjective:    Patient ID: Brittany Clarke, female    DOB: Aug 25, 1940, 73 y.o.   MRN: [id number]  HPI 73 year old female here for followup diabetes, congestive heart failure,. She denies chest pain shortness of breath, when she checks her blood glucose at home it is usually between 100 and 200. A1c 3 months ago was 5.9. She does complain of some pain in her right ear today. She takes a diuretic and frequently. We discussed indications for taking it such as shortness of breath or increased swelling in her ankles and feet.    Review of Systems  Constitutional: Negative.   HENT: Positive for ear pain.   Eyes: Negative.   Respiratory: Negative.   Cardiovascular: Negative.   Gastrointestinal: Negative.   Genitourinary: Negative.   Neurological: Negative.   Psychiatric/Behavioral: Negative.        Objective:   Physical Exam  Constitutional: She is oriented to person, place, and time.  Obese  HENT:  Head: Normocephalic.  R TM with inc vascular markings  Eyes: Pupils are equal, round, and reactive to light.  Neck: Normal range of motion.  Cardiovascular: Normal rate.   Pulmonary/Chest: Effort normal.  Musculoskeletal: She exhibits edema.  Neurological: She is alert and oriented to person, place, and time.  Skin: Skin is warm.     BP 110/73  Pulse 76  Temp(Src) 98.8 F (37.1 C) (Oral)     Assessment & Plan:  1. Essential hypertension   2. Congestive heart failure, unspecified congestive heart failure chronicity, unspecified congestive heart failure type   3. Type 2 diabetes mellitus without complication  - POCT glycosylated hemoglobin (Hb A1C)  4. Otitis media   Amoxicillin 500 mg TID x 10 days  [name] MD

## 2014-03-16 ENCOUNTER — Other Ambulatory Visit: Payer: Self-pay | Admitting: Family Medicine

## 2014-03-17 NOTE — Telephone Encounter (Signed)
Last seen 03/12/14  Dr Sabra Heck  Last lipid 07/29/13

## 2014-03-23 ENCOUNTER — Other Ambulatory Visit: Payer: Self-pay | Admitting: Family Medicine

## 2014-03-23 ENCOUNTER — Other Ambulatory Visit: Payer: Self-pay | Admitting: Nurse Practitioner

## 2014-03-23 ENCOUNTER — Telehealth: Payer: Self-pay | Admitting: Family Medicine

## 2014-03-25 ENCOUNTER — Ambulatory Visit (INDEPENDENT_AMBULATORY_CARE_PROVIDER_SITE_OTHER): Payer: Medicaid Other | Admitting: Pharmacist

## 2014-03-25 DIAGNOSIS — E11610 Type 2 diabetes mellitus with diabetic neuropathic arthropathy: Secondary | ICD-10-CM

## 2014-03-25 DIAGNOSIS — Z7901 Long term (current) use of anticoagulants: Secondary | ICD-10-CM

## 2014-03-25 DIAGNOSIS — Z8673 Personal history of transient ischemic attack (TIA), and cerebral infarction without residual deficits: Secondary | ICD-10-CM

## 2014-03-25 LAB — POCT INR: INR: 2.1

## 2014-03-25 MED ORDER — HYDROCODONE-ACETAMINOPHEN 7.5-325 MG PO TABS: 1.0000 | ORAL_TABLET | Freq: Two times a day (BID) | ORAL | Status: DC | PRN

## 2014-03-25 NOTE — Patient Instructions (Signed)
Anticoagulation Dose Instructions as of 03/25/2014      Brittany Clarke Tue Wed Thu Fri Sat   New Dose 5 mg 2.5 mg 5 mg 2.5 mg 5 mg 2.5 mg 5 mg    Description        Continue warfarin 5mg  1/2 tablet on mondays, wednesdays and fridays and 1 tablet all other days.      INR was 2.1 today

## 2014-03-25 NOTE — Telephone Encounter (Signed)
Patient was in today for protime and asked if Rx was ready.  I filled and Dr Sabra Heck signed.  Given to patient in office to take to her pharmacy to fill.  Cherre Robins, PharmD, CPP

## 2014-04-10 ENCOUNTER — Telehealth: Payer: Self-pay | Admitting: Family Medicine

## 2014-04-17 ENCOUNTER — Other Ambulatory Visit: Payer: Self-pay | Admitting: Family Medicine

## 2014-04-20 ENCOUNTER — Telehealth: Payer: Self-pay | Admitting: Family Medicine

## 2014-04-20 ENCOUNTER — Other Ambulatory Visit: Payer: Self-pay | Admitting: Family Medicine

## 2014-04-20 DIAGNOSIS — E11610 Type 2 diabetes mellitus with diabetic neuropathic arthropathy: Secondary | ICD-10-CM

## 2014-04-22 MED ORDER — HYDROCODONE-ACETAMINOPHEN 7.5-325 MG PO TABS: 1.0000 | ORAL_TABLET | Freq: Two times a day (BID) | ORAL | Status: DC | PRN

## 2014-04-22 NOTE — Telephone Encounter (Signed)
Med reordered per patient request

## 2014-04-23 ENCOUNTER — Ambulatory Visit (INDEPENDENT_AMBULATORY_CARE_PROVIDER_SITE_OTHER): Payer: Medicare Other | Admitting: Pharmacist

## 2014-04-23 DIAGNOSIS — IMO0002 Reserved for concepts with insufficient information to code with codable children: Secondary | ICD-10-CM

## 2014-04-23 DIAGNOSIS — Z7901 Long term (current) use of anticoagulants: Secondary | ICD-10-CM

## 2014-04-23 DIAGNOSIS — E1165 Type 2 diabetes mellitus with hyperglycemia: Secondary | ICD-10-CM

## 2014-04-23 DIAGNOSIS — E11610 Type 2 diabetes mellitus with diabetic neuropathic arthropathy: Secondary | ICD-10-CM

## 2014-04-23 LAB — POCT INR: INR: 2

## 2014-04-23 NOTE — Progress Notes (Signed)
Performed diabetic food exam - see notes Dx:  chracot's foot

## 2014-04-27 ENCOUNTER — Other Ambulatory Visit: Payer: Self-pay | Admitting: Pharmacist

## 2014-05-11 ENCOUNTER — Other Ambulatory Visit: Payer: Self-pay | Admitting: Family Medicine

## 2014-05-11 ENCOUNTER — Telehealth: Payer: Self-pay | Admitting: Nurse Practitioner

## 2014-05-12 ENCOUNTER — Other Ambulatory Visit: Payer: Self-pay | Admitting: Nurse Practitioner

## 2014-05-12 DIAGNOSIS — R52 Pain, unspecified: Secondary | ICD-10-CM

## 2014-05-12 DIAGNOSIS — N644 Mastodynia: Secondary | ICD-10-CM

## 2014-05-20 ENCOUNTER — Telehealth: Payer: Self-pay | Admitting: Family Medicine

## 2014-05-20 DIAGNOSIS — E11610 Type 2 diabetes mellitus with diabetic neuropathic arthropathy: Secondary | ICD-10-CM

## 2014-05-20 NOTE — Telephone Encounter (Signed)
Pt's daughter Benjamine Mola aware of appointment date/time

## 2014-05-21 MED ORDER — HYDROCODONE-ACETAMINOPHEN 7.5-325 MG PO TABS: 1.0000 | ORAL_TABLET | Freq: Two times a day (BID) | ORAL | Status: DC | PRN

## 2014-05-21 NOTE — Telephone Encounter (Signed)
Pt aware Rx ready to be picked up at office

## 2014-05-21 NOTE — Telephone Encounter (Signed)
Rx refilled.

## 2014-05-25 DIAGNOSIS — J449 Chronic obstructive pulmonary disease, unspecified: Secondary | ICD-10-CM | POA: Diagnosis not present

## 2014-05-25 DIAGNOSIS — R32 Unspecified urinary incontinence: Secondary | ICD-10-CM | POA: Diagnosis not present

## 2014-05-25 DIAGNOSIS — E119 Type 2 diabetes mellitus without complications: Secondary | ICD-10-CM | POA: Diagnosis not present

## 2014-05-25 DIAGNOSIS — D649 Anemia, unspecified: Secondary | ICD-10-CM | POA: Diagnosis not present

## 2014-05-26 ENCOUNTER — Other Ambulatory Visit: Payer: Self-pay | Admitting: Family Medicine

## 2014-05-28 ENCOUNTER — Other Ambulatory Visit: Payer: PRIVATE HEALTH INSURANCE

## 2014-05-29 ENCOUNTER — Other Ambulatory Visit: Payer: Self-pay | Admitting: Family Medicine

## 2014-05-29 ENCOUNTER — Other Ambulatory Visit: Payer: Self-pay | Admitting: *Deleted

## 2014-05-29 DIAGNOSIS — E11610 Type 2 diabetes mellitus with diabetic neuropathic arthropathy: Secondary | ICD-10-CM

## 2014-06-01 ENCOUNTER — Ambulatory Visit (INDEPENDENT_AMBULATORY_CARE_PROVIDER_SITE_OTHER): Payer: Medicare Other | Admitting: Pharmacist

## 2014-06-01 DIAGNOSIS — Z7901 Long term (current) use of anticoagulants: Secondary | ICD-10-CM

## 2014-06-01 DIAGNOSIS — I639 Cerebral infarction, unspecified: Secondary | ICD-10-CM

## 2014-06-01 LAB — POCT INR: INR: 2.3

## 2014-06-01 NOTE — Patient Instructions (Signed)
Anticoagulation Dose Instructions as of 06/01/2014      Brittany Clarke Tue Wed Thu Fri Sat   New Dose 5 mg 2.5 mg 5 mg 2.5 mg 5 mg 2.5 mg 5 mg    Description        Continue warfarin 5mg  1/2 tablet on mondays, wednesdays and fridays and 1 tablet all other days.       INR was 2.3 today

## 2014-06-02 ENCOUNTER — Other Ambulatory Visit: Payer: Self-pay | Admitting: Nurse Practitioner

## 2014-06-02 DIAGNOSIS — R52 Pain, unspecified: Secondary | ICD-10-CM

## 2014-06-03 ENCOUNTER — Ambulatory Visit
Admission: RE | Admit: 2014-06-03 | Discharge: 2014-06-03 | Disposition: A | Discharge status: Home / Self Care | Payer: Medicare Other | Source: Ambulatory Visit | Attending: Nurse Practitioner | Admitting: Nurse Practitioner

## 2014-06-03 DIAGNOSIS — R52 Pain, unspecified: Secondary | ICD-10-CM

## 2014-06-03 DIAGNOSIS — N644 Mastodynia: Secondary | ICD-10-CM | POA: Diagnosis not present

## 2014-06-05 DIAGNOSIS — I519 Heart disease, unspecified: Secondary | ICD-10-CM | POA: Diagnosis not present

## 2014-06-05 DIAGNOSIS — J449 Chronic obstructive pulmonary disease, unspecified: Secondary | ICD-10-CM | POA: Diagnosis not present

## 2014-06-05 DIAGNOSIS — R0902 Hypoxemia: Secondary | ICD-10-CM | POA: Diagnosis not present

## 2014-06-09 ENCOUNTER — Other Ambulatory Visit: Payer: Self-pay | Admitting: Family Medicine

## 2014-06-10 NOTE — Telephone Encounter (Signed)
Last seen 03/12/14 Dr Sabra Heck  Last lipid level 07/29/13

## 2014-06-13 DIAGNOSIS — J449 Chronic obstructive pulmonary disease, unspecified: Secondary | ICD-10-CM | POA: Diagnosis not present

## 2014-06-13 DIAGNOSIS — R0902 Hypoxemia: Secondary | ICD-10-CM | POA: Diagnosis not present

## 2014-06-13 DIAGNOSIS — I519 Heart disease, unspecified: Secondary | ICD-10-CM | POA: Diagnosis not present

## 2014-06-17 ENCOUNTER — Ambulatory Visit (INDEPENDENT_AMBULATORY_CARE_PROVIDER_SITE_OTHER): Payer: Medicare Other | Admitting: Family Medicine

## 2014-06-17 ENCOUNTER — Encounter: Payer: Self-pay | Admitting: Family Medicine

## 2014-06-17 ENCOUNTER — Other Ambulatory Visit: Payer: Self-pay | Admitting: Family Medicine

## 2014-06-17 VITALS — BP 110/69 | HR 81 | Temp 99.5°F

## 2014-06-17 DIAGNOSIS — Z8673 Personal history of transient ischemic attack (TIA), and cerebral infarction without residual deficits: Secondary | ICD-10-CM

## 2014-06-17 DIAGNOSIS — E119 Type 2 diabetes mellitus without complications: Secondary | ICD-10-CM | POA: Diagnosis not present

## 2014-06-17 DIAGNOSIS — E785 Hyperlipidemia, unspecified: Secondary | ICD-10-CM

## 2014-06-17 DIAGNOSIS — I10 Essential (primary) hypertension: Secondary | ICD-10-CM

## 2014-06-17 DIAGNOSIS — E11610 Type 2 diabetes mellitus with diabetic neuropathic arthropathy: Secondary | ICD-10-CM

## 2014-06-17 LAB — POCT GLYCOSYLATED HEMOGLOBIN (HGB A1C): HEMOGLOBIN A1C: 6.3

## 2014-06-17 MED ORDER — HYDROCODONE-ACETAMINOPHEN 7.5-325 MG PO TABS: 1.0000 | ORAL_TABLET | Freq: Two times a day (BID) | ORAL | Status: DC | PRN

## 2014-06-17 NOTE — Progress Notes (Signed)
Subjective:    Patient ID: Brittany Clarke, female    DOB: 05/16/1941, 74 y.o.   MRN: [id number]  HPI  74 year old female comes in today accompanied by her nurse to follow up chronic medical conditions including diabetes, hypertension, and hyperlipidemia. She would also like a refill on her pain medication.  She was treated for ear infection last time that she says has resolved. Her breathing is doing better. She takes Lasix 20 mg every other day. Her dependent edema has improved on that regimen. She has one pillow orthopnea. There is some shortness of breath with ambulation. It has now been 3 years since her stroke. Family has requested neurology follow-up. I cannot really get a reason from the patient as far as neurologic sequelae or symptoms but the lady who is with her today says sometimes her speech slurs, particularly after taking her medicine. I wonder if maybe this is a side effect to the hydrocodone  Patient Active Problem List   Diagnosis Date Noted  . Unspecified vitamin D deficiency 07/30/2013  . Right hemiplegia 06/25/2013  . Muscle weakness (generalized) 06/25/2013  . Need for prophylactic vaccination and inoculation against influenza 04/16/2013  . 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 Esophageal dysphagia 02/11/2013  . Long term (current) use of anticoagulants 01/24/2013  . Rectal bleeding 01/24/2013  . Chronic constipation 12/02/2012  . Pedal edema 12/02/2012  . Otalgia of left ear 12/02/2012  . History of rectal bleeding 12/02/2012  . HLD (hyperlipidemia) 12/02/2012  . Dysuria 12/02/2012  . Pain in joint, ankle and foot 09/10/2012  . PNA (pneumonia) 06/10/2012  . Hypokalemia 06/10/2012  . CHF (congestive heart failure) 06/10/2012  . Morbid obesity 06/10/2012  . History of CVA (cerebrovascular accident) 06/10/2012  . Anemia 06/10/2012  . DDD (degenerative disc disease), lumbar 06/10/2012  . Allergic rhinitis 06/10/2012  . Gout 06/10/2012  . HTN  (hypertension) 06/10/2012  . DM (diabetes mellitus) 06/10/2012  . Constipation 06/10/2012  . Urinary incontinence 06/10/2012  . History of breast cancer 06/10/2012  . COPD (chronic obstructive pulmonary disease) 06/10/2012   Outpatient Encounter Prescriptions as of 06/17/2014  Medication Sig  . acetaminophen (TYLENOL) 500 MG tablet Take 500 mg by mouth every 6 (six) hours as needed. For pain  . allopurinol (ZYLOPRIM) 300 MG tablet TAKE ONE (1) TABLET EACH DAY  . atorvastatin (LIPITOR) 40 MG tablet TAKE ONE (1) TABLET EACH DAY  . baclofen (LIORESAL) 10 MG tablet Take 10 mg by mouth 3 (three) times daily.  . feeding supplement (GLUCERNA SHAKE) LIQD Take 237 mLs by mouth 3 (three) times daily between meals.  . furosemide (LASIX) 20 MG tablet Take 20 mg by mouth daily as needed for fluid.  Marland Kitchen gabapentin (NEURONTIN) 100 MG capsule Take 100 mg by mouth 2 (two) times daily.  Marland Kitchen gabapentin (NEURONTIN) 400 MG capsule TAKE 1 CAPSULE DAILY EVERY EVENING  . HYDROcodone-acetaminophen (NORCO) 7.5-325 MG per tablet Take 1 tablet by mouth 2 (two) times daily as needed for moderate pain.  Marland Kitchen loratadine (CLARITIN) 10 MG tablet TAKE ONE (1) TABLET EACH DAY  . losartan (COZAAR) 25 MG tablet TAKE ONE (1) TABLET EACH DAY  . metFORMIN (GLUCOPHAGE) 500 MG tablet TAKE ONE TABLET TWICE A DAY WITH FOOD  . metoprolol tartrate (LOPRESSOR) 25 MG tablet TAKE 1/2 TABLET TWICE DAILY  . Misc. Devices (O-RING CUSHION) MISC 1 each by Does not apply route as needed (to relieve pressure on sacrum).  . montelukast (SINGULAIR) 10 MG tablet  TAKE 1 TABLET DAILY IN THE EVENING  . neomycin-polymyxin-hydrocortisone (CORTISPORIN) 3.5-10000-1 otic suspension Place 3 drops into the left ear daily as needed (Ear Pain).  . polyethylene glycol (MIRALAX / GLYCOLAX) packet Take 17 g by mouth daily.  Marland Kitchen warfarin (COUMADIN) 5 MG tablet TAKE ONE (1) TABLET EACH DAY  . [DISCONTINUED] nystatin cream (MYCOSTATIN) Apply to affected area once or twice  daily  . [DISCONTINUED] nystatin-triamcinolone ointment (MYCOLOG) APPLY TWICE DAILY  . [DISCONTINUED] triamcinolone cream (KENALOG) 0.1 % Apply to affected areas once or twice daily     Review of Systems  Respiratory: Negative.   Cardiovascular: Negative.   Gastrointestinal: Negative.   Endocrine: Negative.   Genitourinary: Negative.   Neurological: Positive for weakness.       Objective:   Physical Exam  Constitutional: She is oriented to person, place, and time. She appears well-developed.  HENT:  Head: Normocephalic and atraumatic.  Eyes: Pupils are equal, round, and reactive to light.  Neck: Normal range of motion.  Cardiovascular: Normal rate and regular rhythm.   Pulmonary/Chest: Effort normal.  Abdominal: Soft.  Neurological: She is alert and oriented to person, place, and time. She exhibits abnormal muscle tone.  R sided weakness  Psychiatric: She has a normal mood and affect.   BP 110/69 mmHg  Pulse 81  Temp(Src) 99.5 F (37.5 C) (Oral)  Wt         Assessment & Plan:  1. HLD (hyperlipidemia)  - Lipid panel  2. Essential hypertension   3. Type 2 diabetes mellitus without complication Testing at home has been okay. She checks her sugar mostly at nighttime and it ranges between 150 and 200 - POCT glycosylated hemoglobin (Hb A1C) - CMP14+EGFR  4. Charcot foot due to diabetes mellitus  - HYDROcodone-acetaminophen (NORCO) 7.5-325 MG per tablet; Take 1 tablet by mouth 2 (two)    times daily as needed for moderate pain.  Dispense: 60 tablet; Refill: 0  [name] MD

## 2014-06-18 LAB — CMP14+EGFR
A/G RATIO: 1.4 (ref 1.1–2.5)
ALBUMIN: 3.8 g/dL (ref 3.5–4.8)
ALT: 11 IU/L (ref 0–32)
AST: 18 IU/L (ref 0–40)
Alkaline Phosphatase: 106 IU/L (ref 39–117)
BUN/Creatinine Ratio: 24 (ref 11–26)
BUN: 12 mg/dL (ref 8–27)
CO2: 23 mmol/L (ref 18–29)
CREATININE: 0.5 mg/dL — AB (ref 0.57–1.00)
Calcium: 9.3 mg/dL (ref 8.7–10.3)
Chloride: 103 mmol/L (ref 97–108)
GFR calc Af Amer: 112 mL/min/{1.73_m2} (ref >59)
GFR calc non Af Amer: 97 mL/min/{1.73_m2} (ref >59)
GLUCOSE: 106 mg/dL — AB (ref 65–99)
Globulin, Total: 2.8 g/dL (ref 1.5–4.5)
POTASSIUM: 4.5 mmol/L (ref 3.5–5.2)
Sodium: 143 mmol/L (ref 134–144)
Total Bilirubin: 0.4 mg/dL (ref 0.0–1.2)
Total Protein: 6.6 g/dL (ref 6.0–8.5)

## 2014-06-18 LAB — LIPID PANEL
CHOL/HDL RATIO: 3 ratio (ref 0.0–4.4)
Cholesterol, Total: 150 mg/dL (ref 100–199)
HDL: 50 mg/dL (ref >39)
LDL CALC: 84 mg/dL (ref 0–99)
TRIGLYCERIDES: 80 mg/dL (ref 0–149)
VLDL Cholesterol Cal: 16 mg/dL (ref 5–40)

## 2014-06-18 NOTE — Telephone Encounter (Signed)
Last seen 06/17/14 Dr Sabra Heck  This med not on EPIC list

## 2014-06-19 ENCOUNTER — Telehealth: Payer: Self-pay | Admitting: Family Medicine

## 2014-06-19 ENCOUNTER — Telehealth: Payer: Self-pay | Admitting: Neurology

## 2014-06-19 NOTE — Telephone Encounter (Signed)
Pt wants to stay with her old neuro Dr I notified the referring Dr office as well

## 2014-07-03 ENCOUNTER — Telehealth: Payer: Self-pay | Admitting: Family Medicine

## 2014-07-03 DIAGNOSIS — R262 Difficulty in walking, not elsewhere classified: Secondary | ICD-10-CM

## 2014-07-03 NOTE — Telephone Encounter (Signed)
Is it ok to send referral patient next door for physical therapy to help with her gait/walking

## 2014-07-03 NOTE — Telephone Encounter (Signed)
Patient daughter aware and referral made.

## 2014-07-03 NOTE — Telephone Encounter (Signed)
Okay for PT with me

## 2014-07-04 ENCOUNTER — Other Ambulatory Visit: Payer: Self-pay | Admitting: Family Medicine

## 2014-07-06 DIAGNOSIS — I519 Heart disease, unspecified: Secondary | ICD-10-CM | POA: Diagnosis not present

## 2014-07-06 DIAGNOSIS — R0902 Hypoxemia: Secondary | ICD-10-CM | POA: Diagnosis not present

## 2014-07-06 DIAGNOSIS — J449 Chronic obstructive pulmonary disease, unspecified: Secondary | ICD-10-CM | POA: Diagnosis not present

## 2014-07-11 ENCOUNTER — Other Ambulatory Visit: Payer: Self-pay | Admitting: Family Medicine

## 2014-07-13 ENCOUNTER — Ambulatory Visit (INDEPENDENT_AMBULATORY_CARE_PROVIDER_SITE_OTHER): Payer: Medicare Other | Admitting: Pharmacist

## 2014-07-13 DIAGNOSIS — Z7901 Long term (current) use of anticoagulants: Secondary | ICD-10-CM | POA: Diagnosis not present

## 2014-07-13 DIAGNOSIS — Z8673 Personal history of transient ischemic attack (TIA), and cerebral infarction without residual deficits: Secondary | ICD-10-CM

## 2014-07-13 LAB — POCT INR: INR: 2.3

## 2014-07-13 NOTE — Patient Instructions (Signed)
Anticoagulation Dose Instructions as of 07/13/2014      Dorene Grebe Tue Wed Thu Fri Sat   New Dose 5 mg 2.5 mg 5 mg 2.5 mg 5 mg 2.5 mg 5 mg    Description        Continue warfarin 5mg  1/2 tablet on mondays, wednesdays and fridays and 1 tablet all other days.      INR was 2.3 today

## 2014-07-14 DIAGNOSIS — I519 Heart disease, unspecified: Secondary | ICD-10-CM | POA: Diagnosis not present

## 2014-07-14 DIAGNOSIS — R0902 Hypoxemia: Secondary | ICD-10-CM | POA: Diagnosis not present

## 2014-07-14 DIAGNOSIS — J449 Chronic obstructive pulmonary disease, unspecified: Secondary | ICD-10-CM | POA: Diagnosis not present

## 2014-07-16 ENCOUNTER — Other Ambulatory Visit: Payer: Self-pay | Admitting: Family Medicine

## 2014-07-16 DIAGNOSIS — E11610 Type 2 diabetes mellitus with diabetic neuropathic arthropathy: Secondary | ICD-10-CM

## 2014-07-16 NOTE — Telephone Encounter (Signed)
Last filled and seen on 06/17/14

## 2014-07-17 MED ORDER — HYDROCODONE-ACETAMINOPHEN 7.5-325 MG PO TABS: 1.0000 | ORAL_TABLET | Freq: Two times a day (BID) | ORAL | Status: DC | PRN

## 2014-07-20 ENCOUNTER — Telehealth: Payer: Self-pay | Admitting: *Deleted

## 2014-07-20 NOTE — Telephone Encounter (Signed)
Dr. Sabra Heck , Ms. Carters ins wishes her to takeone of these meds clotrimazole, ketoconazole, ciclopirox,econazole instead of nystat/triam oint. Will either of these work?  The first two in the list are tier 2 and the second two are tier 3.  Thanks!

## 2014-07-20 NOTE — Telephone Encounter (Signed)
Could try the Clortrimazole

## 2014-07-21 DIAGNOSIS — M6289 Other specified disorders of muscle: Secondary | ICD-10-CM | POA: Diagnosis not present

## 2014-07-21 DIAGNOSIS — R258 Other abnormal involuntary movements: Secondary | ICD-10-CM | POA: Diagnosis not present

## 2014-07-22 ENCOUNTER — Telehealth: Payer: Self-pay | Admitting: Family Medicine

## 2014-07-23 ENCOUNTER — Ambulatory Visit: Payer: Medicare Other | Attending: Family Medicine | Admitting: Physical Therapy

## 2014-07-23 DIAGNOSIS — R262 Difficulty in walking, not elsewhere classified: Secondary | ICD-10-CM | POA: Diagnosis not present

## 2014-07-23 DIAGNOSIS — R29898 Other symptoms and signs involving the musculoskeletal system: Secondary | ICD-10-CM | POA: Diagnosis not present

## 2014-07-23 DIAGNOSIS — M6281 Muscle weakness (generalized): Secondary | ICD-10-CM | POA: Diagnosis not present

## 2014-07-23 DIAGNOSIS — G8190 Hemiplegia, unspecified affecting unspecified side: Secondary | ICD-10-CM | POA: Diagnosis not present

## 2014-07-23 NOTE — Therapy (Signed)
[facility] [place], [place], [address] Phone: [phone]   Fax:  [phone]  Physical Therapy Evaluation  Patient Details  Name: Brittany Clarke MRN: [id number] Date of Birth: [birth year] Referring Provider:  [name], MD  Encounter Date: 07/23/2014      PT End of Session - 07/23/14 1356    Visit Number 1   Number of Visits 24   PT Start Time 0100   PT Stop Time 0152   PT Time Calculation (min) 52 min   Equipment Utilized During Treatment Gait belt   Activity Tolerance Patient tolerated treatment well   Behavior During Therapy [facility] for tasks assessed/performed      Past Medical History  Diagnosis Date  . CHF (congestive heart failure)   . Diabetes mellitus without complication   . COPD (chronic obstructive pulmonary disease)   . Hypertension   . Asthma   . Shortness of breath   . Stroke     2013  . Anxiety   . Pneumonia   . Gout   . 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 Anticoagulant long-term use     Past Surgical History  Procedure Laterality Date  . Breast surgery    . Tubal ligation    . Colonoscopy  04/04/2002    GXQ:JJHERDEY hemorrhoids, otherwise, normal rectum/Scattered left sided diverticula/Somewhat adenomatous appearing ileocecal valve which is probably normal but this area was biopsied. The remainder of the colonic mucosa appeared   normal  . Trigger finger surgery      twice  . Colonoscopy with esophagogastroduodenoscopy (egd) N/A 02/20/2013    Procedure: COLONOSCOPY WITH ESOPHAGOGASTRODUODENOSCOPY (EGD);  Surgeon: Daneil Dolin, MD;  Location: [facility];  Service: Endoscopy;  Laterality: N/A;    There were no vitals taken for this visit.  Visit Diagnosis:  Difficulty walking - Plan: PT plan of care cert/re-cert  Right leg weakness - Plan: PT plan of care cert/re-cert      Subjective Assessment - 07/23/14 1313    Symptoms Walk to move and walk  better.   Limitations Walking   Patient Stated Goals Walk and move better.          [facility] PT Assessment - 07/23/14 0001    Assessment   Medical Diagnosis ifficulty walking   Precautions   Precautions Fall   Precaution Comments Right shoulder sling  Gait belt assist at al times.   Balance Screen   Has the patient fallen in the past 6 months No   Has the patient had a decrease in activity level because of a fear of falling?  No   Is the patient reluctant to leave their home because of a fear of falling?  No   Home Environment   Living Enviornment Private residence   Living Arrangements --  Caregiver.   ROM / Strength   AROM / PROM / Strength Strength  Functional right LE range of motion.   Strength   Overall Strength Comments Right hip flexion= 2-/5; abd=3/5; right knee ext= 4/5 and right ankle dorsiflexion to neutral and eversion= 2-/5.   Transfers   Transfers Stand to Sit   Sit to Stand 3: Mod assist   Stand to Sit 4: Min assist   Ambulation/Gait   Ambulation/Gait Yes   Ambulation/Gait Assistance 4: Min assist  with gait belt hand held on right.   Ambulation/Gait Assistance Details --  Quad cane on right.  Ambulation Distance (Feet) --  50 feet.   Assistive device Large base quad cane   Gait Pattern Step-to pattern;Decreased step length - right;Decreased dorsiflexion - right   Ambulation Surface Level                            PT Short Term Goals - 08/21/14 1402    PT SHORT TERM GOAL #1   Title STG's=LTG's.           PT Long Term Goals - Aug 21, 2014 1402    PT LONG TERM GOAL #1   Title Independent with an HEP.   Time 8   Period Weeks   Status New   PT LONG TERM GOAL #2   Title Walk 200 feet with a quad cane with CGA.   PT LONG TERM GOAL #3   Title Improve strength by 1/2 to 1 full muscle grade.   Time 8   Period Weeks   Status New               Plan - 2014-08-21 1358    Clinical Impression Statement The patient presents to  outpatient PT s/p CVA ~ 3 years ago.  02 sat= 96% and RHR= 96 bpm.  She reports she has not been doing too much at home and would like to become more mobile and independent.   Pt will benefit from skilled therapeutic intervention in order to improve on the following deficits Abnormal gait;Decreased strength;Difficulty walking   Rehab Potential Good   PT Frequency 3x / week   PT Duration 8 weeks   PT Next Visit Plan Gait training.  Right LE strengthening. Nustep.  Parallel bar activities.   Consulted and Agree with Plan of Care Patient          G-Codes - 2014/08/21 1408    Functional Assessment Tool Used Clinical judgement:     Functional Limitation Mobility: Walking and moving around   Mobility: Walking and Moving Around Current Status [phone]) At least 60 percent but less than 80 percent impaired, limited or restricted   Mobility: Walking and Moving Around Goal Status [phone]) At least 20 percent but less than 40 percent impaired, limited or restricted       Problem List Patient Active Problem List   Diagnosis Date Noted  . Unspecified vitamin D deficiency 07/30/2013  . Right hemiplegia 06/25/2013  . Muscle weakness (generalized) 06/25/2013  . Need for prophylactic vaccination and inoculation against influenza 04/16/2013  . 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 Esophageal dysphagia 02/11/2013  . Long term (current) use of anticoagulants 01/24/2013  . Rectal bleeding 01/24/2013  . Chronic constipation 12/02/2012  . Pedal edema 12/02/2012  . Otalgia of left ear 12/02/2012  . History of rectal bleeding 12/02/2012  . HLD (hyperlipidemia) 12/02/2012  . Dysuria 12/02/2012  . Pain in joint, ankle and foot 09/10/2012  . PNA (pneumonia) 06/10/2012  . Hypokalemia 06/10/2012  . CHF (congestive heart failure) 06/10/2012  . Morbid obesity 06/10/2012  . History of CVA (cerebrovascular accident) 06/10/2012  . Anemia 06/10/2012  . DDD (degenerative disc disease), lumbar  06/10/2012  . Allergic rhinitis 06/10/2012  . Gout 06/10/2012  . HTN (hypertension) 06/10/2012  . DM (diabetes mellitus) 06/10/2012  . Constipation 06/10/2012  . Urinary incontinence 06/10/2012  . History of breast cancer 06/10/2012  . COPD (chronic obstructive pulmonary disease) 06/10/2012    Sarahjane Matherly, [place] MPT 2014/08/21, 2:24 PM  [facility] [place], [place], [address] Phone: [phone]   Fax:  [phone]

## 2014-07-30 ENCOUNTER — Ambulatory Visit: Payer: Medicare Other | Admitting: Physical Therapy

## 2014-07-30 ENCOUNTER — Encounter: Payer: Self-pay | Admitting: Physical Therapy

## 2014-07-30 DIAGNOSIS — R262 Difficulty in walking, not elsewhere classified: Secondary | ICD-10-CM

## 2014-07-30 DIAGNOSIS — R29898 Other symptoms and signs involving the musculoskeletal system: Secondary | ICD-10-CM

## 2014-07-30 DIAGNOSIS — G8191 Hemiplegia, unspecified affecting right dominant side: Secondary | ICD-10-CM

## 2014-07-30 DIAGNOSIS — M6281 Muscle weakness (generalized): Secondary | ICD-10-CM

## 2014-07-30 NOTE — Therapy (Signed)
[facility] [place], [place], [address] Phone: [phone]   Fax:  [phone]  Physical Therapy Treatment  Patient Details  Name: Brittany Clarke MRN: [id number] Date of Birth: 04-11-1941 Referring Provider:  [name], MD  Encounter Date: 07/30/2014      PT End of Session - 07/30/14 1313    Visit Number 2   Number of Visits 24   PT Start Time 1229   PT Stop Time 1313   PT Time Calculation (min) 44 min   Equipment Utilized During Treatment Gait belt   Activity Tolerance Patient tolerated treatment well   Behavior During Therapy [facility] for tasks assessed/performed      Past Medical History  Diagnosis Date  . CHF (congestive heart failure)   . Diabetes mellitus without complication   . COPD (chronic obstructive pulmonary disease)   . Hypertension   . Asthma   . Shortness of breath   . Stroke     2013  . Anxiety   . Pneumonia   . Gout   . 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 Anticoagulant long-term use     Past Surgical History  Procedure Laterality Date  . Breast surgery    . Tubal ligation    . Colonoscopy  04/04/2002    DZH:[id number] hemorrhoids, otherwise, normal rectum/Scattered left sided diverticula/Somewhat adenomatous appearing ileocecal valve which is probably normal but this area was biopsied. The remainder of the colonic mucosa appeared   normal  . Trigger finger surgery      twice  . Colonoscopy with esophagogastroduodenoscopy (egd) N/A 02/20/2013    Procedure: COLONOSCOPY WITH ESOPHAGOGASTRODUODENOSCOPY (EGD);  Surgeon: Daneil Dolin, MD;  [facility];  Service: Endoscopy;  Laterality: N/A;    There were no vitals filed for this visit.  Visit Diagnosis:  Difficulty walking  Right leg weakness  Right hemiplegia  Muscle weakness (generalized)      Subjective Assessment - 07/30/14 1231    Symptoms Walk to move and walk better.   Limitations  Walking   Patient Stated Goals Walk and move better.   Currently in Pain? No/denies                       [facility] Adult PT Treatment/Exercise - 07/30/14 0001    Ambulation/Gait   Ambulation/Gait Yes   Ambulation/Gait Assistance 4: Min guard   Ambulation/Gait Assistance Details wc following   Ambulation Distance (Feet) 45 Feet   Assistive device Large base quad cane   Gait Pattern Step-to pattern;Decreased step length - right;Decreased dorsiflexion - right   Ambulation Surface Level   Exercises   Exercises Knee/Hip   Knee/Hip Exercises: Aerobic   Stationary Bike Nustep L3 x 37min   Knee/Hip Exercises: Standing   Hip ADduction AROM;Right;2 sets;10 reps   Hip ADduction Limitations half range   Forward Step Up --  2"step    Other Standing Knee Exercises standing marching2x10   Knee/Hip Exercises: Seated   Long Arc Quad Weight 3 lbs.   Long Arc Quad Limitations 3x10   Other Seated Knee Exercises yellow t-band for DF 3x10   Other Seated Knee Exercises sit to stand x 8 mod assist                  PT Short Term Goals - 07/23/14 1402    PT SHORT TERM GOAL #1   Title STG's=LTG's.  PT Long Term Goals - 07/23/14 1402    PT LONG TERM GOAL #1   Title Independent with an HEP.   Time 8   Period Weeks   Status New   PT LONG TERM GOAL #2   Title Walk 200 feet with a quad cane with CGA.   PT LONG TERM GOAL #3   Title Improve strength by 1/2 to 1 full muscle grade.   Time 8   Period Weeks   Status New               Plan - 07/30/14 1316    Clinical Impression Statement pt tolerated tx well, difficulty with standing ex's due to weakness. pt stated there is little help at home with ex's.Goals onging.   Pt will benefit from skilled therapeutic intervention in order to improve on the following deficits Abnormal gait;Decreased strength;Difficulty walking   Rehab Potential Good   PT Frequency 3x / week   PT Duration 8 weeks   PT Next Visit  Plan Gait training.  Right LE strengthening. Nustep.  Parallel bar activities./per pt tolerance   Consulted and Agree with Plan of Care Patient        Problem List Patient Active Problem List   Diagnosis Date Noted  . Unspecified vitamin D deficiency 07/30/2013  . Right hemiplegia 06/25/2013  . Muscle weakness (generalized) 06/25/2013  . Need for prophylactic vaccination and inoculation against influenza 04/16/2013  . 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 Esophageal dysphagia 02/11/2013  . Long term (current) use of anticoagulants 01/24/2013  . Rectal bleeding 01/24/2013  . Chronic constipation 12/02/2012  . Pedal edema 12/02/2012  . Otalgia of left ear 12/02/2012  . History of rectal bleeding 12/02/2012  . HLD (hyperlipidemia) 12/02/2012  . Dysuria 12/02/2012  . Pain in joint, ankle and foot 09/10/2012  . PNA (pneumonia) 06/10/2012  . Hypokalemia 06/10/2012  . CHF (congestive heart failure) 06/10/2012  . Morbid obesity 06/10/2012  . History of CVA (cerebrovascular accident) 06/10/2012  . Anemia 06/10/2012  . DDD (degenerative disc disease), lumbar 06/10/2012  . Allergic rhinitis 06/10/2012  . Gout 06/10/2012  . HTN (hypertension) 06/10/2012  . DM (diabetes mellitus) 06/10/2012  . Constipation 06/10/2012  . Urinary incontinence 06/10/2012  . History of breast cancer 06/10/2012  . COPD (chronic obstructive pulmonary disease) 06/10/2012    DUNFORD, CHRISTINA P, PTA 07/30/2014, 1:19 PM  [facility] [address] [place], [place], [address] Phone: [phone]   Fax:  [phone]

## 2014-08-03 ENCOUNTER — Other Ambulatory Visit: Payer: Self-pay | Admitting: Family Medicine

## 2014-08-03 DIAGNOSIS — E119 Type 2 diabetes mellitus without complications: Secondary | ICD-10-CM | POA: Diagnosis not present

## 2014-08-04 ENCOUNTER — Ambulatory Visit: Payer: Medicare Other | Admitting: Physical Therapy

## 2014-08-04 ENCOUNTER — Other Ambulatory Visit: Payer: Self-pay | Admitting: Family Medicine

## 2014-08-04 DIAGNOSIS — G8190 Hemiplegia, unspecified affecting unspecified side: Secondary | ICD-10-CM | POA: Diagnosis not present

## 2014-08-04 DIAGNOSIS — J449 Chronic obstructive pulmonary disease, unspecified: Secondary | ICD-10-CM | POA: Diagnosis not present

## 2014-08-04 DIAGNOSIS — R262 Difficulty in walking, not elsewhere classified: Secondary | ICD-10-CM | POA: Diagnosis not present

## 2014-08-04 DIAGNOSIS — I519 Heart disease, unspecified: Secondary | ICD-10-CM | POA: Diagnosis not present

## 2014-08-04 DIAGNOSIS — R0902 Hypoxemia: Secondary | ICD-10-CM | POA: Diagnosis not present

## 2014-08-04 DIAGNOSIS — M6281 Muscle weakness (generalized): Secondary | ICD-10-CM | POA: Diagnosis not present

## 2014-08-04 DIAGNOSIS — G8191 Hemiplegia, unspecified affecting right dominant side: Secondary | ICD-10-CM

## 2014-08-04 DIAGNOSIS — R29898 Other symptoms and signs involving the musculoskeletal system: Secondary | ICD-10-CM | POA: Diagnosis not present

## 2014-08-04 NOTE — Therapy (Signed)
[facility] [place], [place], [address] Phone: [phone]   Fax:  [phone]  Physical Therapy Treatment  Patient Details  Name: Brittany Clarke MRN: [id number] Date of Birth: [birth year] Referring Provider:  [name], MD  Encounter Date: 08/04/2014      PT End of Session - 08/04/14 1311    Visit Number 3   Number of Visits 24   PT Start Time 0101   PT Stop Time 0150   PT Time Calculation (min) 49 min   Equipment Utilized During Treatment Gait belt  and right shoulder sling.      Past Medical History  Diagnosis Date  . CHF (congestive heart failure)   . Diabetes mellitus without complication   . COPD (chronic obstructive pulmonary disease)   . Hypertension   . Asthma   . Shortness of breath   . Stroke     2013  . Anxiety   . Pneumonia   . Gout   . 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 Anticoagulant long-term use     Past Surgical History  Procedure Laterality Date  . Breast surgery    . Tubal ligation    . Colonoscopy  04/04/2002    GYI:RSWNIOEV hemorrhoids, otherwise, normal rectum/Scattered left sided diverticula/Somewhat adenomatous appearing ileocecal valve which is probably normal but this area was biopsied. The remainder of the colonic mucosa appeared   normal  . Trigger finger surgery      twice  . Colonoscopy with esophagogastroduodenoscopy (egd) N/A 02/20/2013    Procedure: COLONOSCOPY WITH ESOPHAGOGASTRODUODENOSCOPY (EGD);  Surgeon: Daneil Dolin, MD;  [facility];  Service: Endoscopy;  Laterality: N/A;    There were no vitals filed for this visit.  Visit Diagnosis:  Difficulty walking  Right hemiplegia      Subjective Assessment - 08/04/14 1311    Symptoms Doing good today   Limitations Walking   Patient Stated Goals Walk and move better.   Multiple Pain Sites No                       [facility] Adult PT Treatment/Exercise -  08/04/14 1322    Ambulation/Gait   Ambulation Distance (Feet) --  100 feet x 3.   Gait Pattern Step-to pattern;Decreased step length - right;Decreased dorsiflexion - right                  PT Short Term Goals - 07/23/14 1402    PT SHORT TERM GOAL #1   Title STG's=LTG's.           PT Long Term Goals - 07/23/14 1402    PT LONG TERM GOAL #1   Title Independent with an HEP.   Time 8   Period Weeks   Status New   PT LONG TERM GOAL #2   Title Walk 200 feet with a quad cane with CGA.   PT LONG TERM GOAL #3   Title Improve strength by 1/2 to 1 full muscle grade.   Time 8   Period Weeks   Status New      Nustep level 2 x 8 minutes.         Problem List Patient Active Problem List   Diagnosis Date Noted  . Unspecified vitamin D deficiency 07/30/2013  . Right hemiplegia 06/25/2013  . Muscle weakness (generalized) 06/25/2013  . Need for prophylactic vaccination and inoculation against influenza 04/16/2013  .  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 Esophageal dysphagia 02/11/2013  . Long term (current) use of anticoagulants 01/24/2013  . Rectal bleeding 01/24/2013  . Chronic constipation 12/02/2012  . Pedal edema 12/02/2012  . Otalgia of left ear 12/02/2012  . History of rectal bleeding 12/02/2012  . HLD (hyperlipidemia) 12/02/2012  . Dysuria 12/02/2012  . Pain in joint, ankle and foot 09/10/2012  . PNA (pneumonia) 06/10/2012  . Hypokalemia 06/10/2012  . CHF (congestive heart failure) 06/10/2012  . Morbid obesity 06/10/2012  . History of CVA (cerebrovascular accident) 06/10/2012  . Anemia 06/10/2012  . DDD (degenerative disc disease), lumbar 06/10/2012  . Allergic rhinitis 06/10/2012  . Gout 06/10/2012  . HTN (hypertension) 06/10/2012  . DM (diabetes mellitus) 06/10/2012  . Constipation 06/10/2012  . Urinary incontinence 06/10/2012  . History of breast cancer 06/10/2012  . COPD (chronic obstructive pulmonary disease) 06/10/2012     Shonnie Poudrier, [place]  MPT  08/04/2014, 1:53 PM  [facility] [address] [place], [place], [address] Phone: [phone]   Fax:  [phone]

## 2014-08-06 ENCOUNTER — Ambulatory Visit: Payer: Medicare Other | Admitting: Physical Therapy

## 2014-08-06 DIAGNOSIS — R262 Difficulty in walking, not elsewhere classified: Secondary | ICD-10-CM | POA: Diagnosis not present

## 2014-08-06 DIAGNOSIS — M6281 Muscle weakness (generalized): Secondary | ICD-10-CM | POA: Diagnosis not present

## 2014-08-06 DIAGNOSIS — G8190 Hemiplegia, unspecified affecting unspecified side: Secondary | ICD-10-CM | POA: Diagnosis not present

## 2014-08-06 DIAGNOSIS — R29898 Other symptoms and signs involving the musculoskeletal system: Secondary | ICD-10-CM | POA: Diagnosis not present

## 2014-08-06 NOTE — Therapy (Signed)
[facility] [place], [place], [address] Phone: [phone]   Fax:  [phone]  Physical Therapy Treatment  Patient Details  Name: Brittany Clarke MRN: [id number] Date of Birth: 08/29/1940 Referring Provider:  [name], MD  Encounter Date: 08/06/2014      PT End of Session - 08/06/14 1310    Visit Number 4   Number of Visits 24   PT Start Time 0102   PT Stop Time 0143   PT Time Calculation (min) 41 min      Past Medical History  Diagnosis Date  . CHF (congestive heart failure)   . Diabetes mellitus without complication   . COPD (chronic obstructive pulmonary disease)   . Hypertension   . Asthma   . Shortness of breath   . Stroke     2013  . Anxiety   . Pneumonia   . Gout   . 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 Anticoagulant long-term use     Past Surgical History  Procedure Laterality Date  . Breast surgery    . Tubal ligation    . Colonoscopy  04/04/2002    FTD:DUKGURKY hemorrhoids, otherwise, normal rectum/Scattered left sided diverticula/Somewhat adenomatous appearing ileocecal valve which is probably normal but this area was biopsied. The remainder of the colonic mucosa appeared   normal  . Trigger finger surgery      twice  . Colonoscopy with esophagogastroduodenoscopy (egd) N/A 02/20/2013    Procedure: COLONOSCOPY WITH ESOPHAGOGASTRODUODENOSCOPY (EGD);  Surgeon: Daneil Dolin, MD;  [facility];  Service: Endoscopy;  Laterality: N/A;    There were no vitals filed for this visit.  Visit Diagnosis:  Difficulty walking      Subjective Assessment - 08/06/14 1310    Symptoms Doing good today   Limitations Walking   Patient Stated Goals Walk and move better.   Currently in Pain? No/denies   Multiple Pain Sites No      Treatment:  Gait training with right shoulder sling, gait belt and wide base quad cane:  100 FEET X 4.  eXCELLENT JOB  TODAY.                           PT Short Term Goals - 07/23/14 1402    PT SHORT TERM GOAL #1   Title STG's=LTG's.           PT Long Term Goals - 07/23/14 1402    PT LONG TERM GOAL #1   Title Independent with an HEP.   Time 8   Period Weeks   Status New   PT LONG TERM GOAL #2   Title Walk 200 feet with a quad cane with CGA.   PT LONG TERM GOAL #3   Title Improve strength by 1/2 to 1 full muscle grade.   Time 8   Period Weeks   Status New               Problem List Patient Active Problem List   Diagnosis Date Noted  . Unspecified vitamin D deficiency 07/30/2013  . Right hemiplegia 06/25/2013  . Muscle weakness (generalized) 06/25/2013  . Need for prophylactic vaccination and inoculation against influenza 04/16/2013  . 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 Esophageal dysphagia 02/11/2013  . Long term (current) use of anticoagulants 01/24/2013  . Rectal bleeding 01/24/2013  . Chronic constipation 12/02/2012  .  Pedal edema 12/02/2012  . Otalgia of left ear 12/02/2012  . History of rectal bleeding 12/02/2012  . HLD (hyperlipidemia) 12/02/2012  . Dysuria 12/02/2012  . Pain in joint, ankle and foot 09/10/2012  . PNA (pneumonia) 06/10/2012  . Hypokalemia 06/10/2012  . CHF (congestive heart failure) 06/10/2012  . Morbid obesity 06/10/2012  . History of CVA (cerebrovascular accident) 06/10/2012  . Anemia 06/10/2012  . DDD (degenerative disc disease), lumbar 06/10/2012  . Allergic rhinitis 06/10/2012  . Gout 06/10/2012  . HTN (hypertension) 06/10/2012  . DM (diabetes mellitus) 06/10/2012  . Constipation 06/10/2012  . Urinary incontinence 06/10/2012  . History of breast cancer 06/10/2012  . COPD (chronic obstructive pulmonary disease) 06/10/2012    Brindy Higginbotham, [place] MPT 08/06/2014, 1:45 PM  [facility] [address] [place], [place], [address] Phone: [phone]   Fax:   [phone]

## 2014-08-06 NOTE — Patient Instructions (Signed)
PLEASE WALK WITH PATIENT 4 TIMES DAILY WITH QUAD CANE AND GAIT BELT.  5-6 TIMES A WEEK.

## 2014-08-08 ENCOUNTER — Other Ambulatory Visit: Payer: Self-pay | Admitting: Family Medicine

## 2014-08-10 ENCOUNTER — Ambulatory Visit: Payer: Medicare Other | Admitting: Neurology

## 2014-08-12 ENCOUNTER — Encounter: Payer: Self-pay | Admitting: Physical Therapy

## 2014-08-12 ENCOUNTER — Ambulatory Visit: Payer: Medicare Other | Admitting: Physical Therapy

## 2014-08-12 DIAGNOSIS — G8191 Hemiplegia, unspecified affecting right dominant side: Secondary | ICD-10-CM

## 2014-08-12 DIAGNOSIS — R0902 Hypoxemia: Secondary | ICD-10-CM | POA: Diagnosis not present

## 2014-08-12 DIAGNOSIS — J449 Chronic obstructive pulmonary disease, unspecified: Secondary | ICD-10-CM | POA: Diagnosis not present

## 2014-08-12 DIAGNOSIS — M6281 Muscle weakness (generalized): Secondary | ICD-10-CM | POA: Diagnosis not present

## 2014-08-12 DIAGNOSIS — R262 Difficulty in walking, not elsewhere classified: Secondary | ICD-10-CM

## 2014-08-12 DIAGNOSIS — R29898 Other symptoms and signs involving the musculoskeletal system: Secondary | ICD-10-CM

## 2014-08-12 DIAGNOSIS — I519 Heart disease, unspecified: Secondary | ICD-10-CM | POA: Diagnosis not present

## 2014-08-12 DIAGNOSIS — G8190 Hemiplegia, unspecified affecting unspecified side: Secondary | ICD-10-CM | POA: Diagnosis not present

## 2014-08-12 NOTE — Therapy (Signed)
[facility] [place], [place], [address] Phone: [phone]   Fax:  [phone]  Physical Therapy Treatment  Patient Details  Name: Brittany Clarke MRN: [id number] Date of Birth: 08/28/1940 Referring Provider:  [name], MD  Encounter Date: 08/12/2014      PT End of Session - 08/12/14 1257    Visit Number 5   Number of Visits 24   PT Start Time 5631   PT Stop Time 1345   PT Time Calculation (min) 60 min   Equipment Utilized During Treatment Gait belt  R shoulder sling      Past Medical History  Diagnosis Date  . CHF (congestive heart failure)   . Diabetes mellitus without complication   . COPD (chronic obstructive pulmonary disease)   . Hypertension   . Asthma   . Shortness of breath   . Stroke     2013  . Anxiety   . Pneumonia   . Gout   . 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 Anticoagulant long-term use     Past Surgical History  Procedure Laterality Date  . Breast surgery    . Tubal ligation    . Colonoscopy  04/04/2002    SHF:WYOVZCHY hemorrhoids, otherwise, normal rectum/Scattered left sided diverticula/Somewhat adenomatous appearing ileocecal valve which is probably normal but this area was biopsied. The remainder of the colonic mucosa appeared   normal  . Trigger finger surgery      twice  . Colonoscopy with esophagogastroduodenoscopy (egd) N/A 02/20/2013    Procedure: COLONOSCOPY WITH ESOPHAGOGASTRODUODENOSCOPY (EGD);  Surgeon: Daneil Dolin, MD;  [facility];  Service: Endoscopy;  Laterality: N/A;    There were no vitals filed for this visit.  Visit Diagnosis:  Difficulty walking  Right hemiplegia  Right leg weakness  Muscle weakness (generalized)      Subjective Assessment - 08/12/14 1254    Symptoms Patient reports she's doing good today. Is having some foot pain today in R foot. States she has been walking four times a day but not  consecutive laps.   Limitations Walking   Patient Stated Goals Walk and move better.   Currently in Pain? Yes   Pain Score 8    Pain Location Foot   Pain Orientation Right   Pain Descriptors / Indicators Constant;Aching            [facility] PT Assessment - 08/12/14 0001    Assessment   Medical Diagnosis ifficulty walking                   [facility] Adult PT Treatment/Exercise - 08/12/14 0001    Transfers   Transfers Stand to Sit   Sit to Stand 3: Mod assist  Mod A when standing from WC; from high mat table CGA .   Sit to Stand Details (indicate cue type and reason) Patient required extra time in order to stand and multiple attempts.   Stand to Sit 4: Min assist  Min A to WC; CGA to high mat table   Stand to Sit Details required assist with cueing of when she was against the wheelchair.   Ambulation/Gait   Ambulation/Gait Yes   Ambulation/Gait Assistance 4: Min guard   Ambulation Distance (Feet) 278 Feet  Required prolonged rest half way   Assistive device Large base quad cane   Gait Pattern Step-to pattern;Decreased step length - right;Decreased hip/knee flexion - right;Decreased dorsiflexion -  right   Ambulation Surface Indoor;Level   Exercises   Exercises Knee/Hip   Knee/Hip Exercises: Aerobic   Stationary Bike Nustep workload 4, seat 12 x 10 minutes   Knee/Hip Exercises: Standing   Forward Step Up Right;10 reps;Step Height: 2";1 set   Knee/Hip Exercises: Seated   Other Seated Knee Exercises Sit to stand 2 sets 10 reps; CGA                  PT Short Term Goals - 07/23/14 1402    PT SHORT TERM GOAL #1   Title STG's=LTG's.           PT Long Term Goals - 07/23/14 1402    PT LONG TERM GOAL #1   Title Independent with an HEP.   Time 8   Period Weeks   Status New   PT LONG TERM GOAL #2   Title Walk 200 feet with a quad cane with CGA.   PT LONG TERM GOAL #3   Title Improve strength by 1/2 to 1 full muscle grade.   Time 8   Period Weeks    Status New               Plan - 08/12/14 1302    Clinical Impression Statement Patient tolerated treatment well today and began getting fatigued by the end of the treatment. Patient does well with sit to stand from tall surfaces and is CGA. Patient also did well when asked to not push with LUE when standing. Patient required cueing for heel/toe and to straighten foot. Adheres to cues but needs cueing more once fatigue sets in. Patient ambulated a total of 276 ft today with one rest break halfway through. Patient continued to complain of R foot pain at the end of the session. Patient did well with step ups on 2" step until fatigue set in. Patient was stepping up with RLE and down with LLE.   Pt will benefit from skilled therapeutic intervention in order to improve on the following deficits Abnormal gait;Decreased strength;Difficulty walking   Rehab Potential Good   PT Frequency 3x / week   PT Duration 8 weeks   PT Next Visit Plan Continue with PT POC. Consider taping quarter to R heel to give audible cueing for heel/toe during gait training in the next session.   Consulted and Agree with Plan of Care Patient        Problem List Patient Active Problem List   Diagnosis Date Noted  . Unspecified vitamin D deficiency 07/30/2013  . Right hemiplegia 06/25/2013  . Muscle weakness (generalized) 06/25/2013  . Need for prophylactic vaccination and inoculation against influenza 04/16/2013  . 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 Esophageal dysphagia 02/11/2013  . Long term (current) use of anticoagulants 01/24/2013  . Rectal bleeding 01/24/2013  . Chronic constipation 12/02/2012  . Pedal edema 12/02/2012  . Otalgia of left ear 12/02/2012  . History of rectal bleeding 12/02/2012  . HLD (hyperlipidemia) 12/02/2012  . Dysuria 12/02/2012  . Pain in joint, ankle and foot 09/10/2012  . PNA (pneumonia) 06/10/2012  . Hypokalemia 06/10/2012  . CHF (congestive heart failure)  06/10/2012  . Morbid obesity 06/10/2012  . History of CVA (cerebrovascular accident) 06/10/2012  . Anemia 06/10/2012  . DDD (degenerative disc disease), lumbar 06/10/2012  . Allergic rhinitis 06/10/2012  . Gout 06/10/2012  . HTN (hypertension) 06/10/2012  . DM (diabetes mellitus) 06/10/2012  . Constipation 06/10/2012  . Urinary incontinence 06/10/2012  . History  of breast cancer 06/10/2012  . COPD (chronic obstructive pulmonary disease) 06/10/2012    Wynelle Fanny, PTA 08/12/2014, 1:57 PM  [facility] [address] [place], [place], [address] Phone: [phone]   Fax:  [phone]

## 2014-08-13 ENCOUNTER — Ambulatory Visit (INDEPENDENT_AMBULATORY_CARE_PROVIDER_SITE_OTHER): Payer: Medicare Other | Admitting: Pharmacist

## 2014-08-13 DIAGNOSIS — Z8673 Personal history of transient ischemic attack (TIA), and cerebral infarction without residual deficits: Secondary | ICD-10-CM | POA: Diagnosis not present

## 2014-08-13 DIAGNOSIS — Z7901 Long term (current) use of anticoagulants: Secondary | ICD-10-CM | POA: Diagnosis not present

## 2014-08-13 LAB — POCT INR: INR: 2.1

## 2014-08-13 MED ORDER — NYSTATIN-TRIAMCINOLONE 100000-0.1 UNIT/GM-% EX CREA: 1.0000 "application " | TOPICAL_CREAM | Freq: Two times a day (BID) | CUTANEOUS | Status: DC

## 2014-08-13 NOTE — Patient Instructions (Signed)
Anticoagulation Dose Instructions as of 08/13/2014      Brittany Clarke Tue Wed Thu Fri Sat   New Dose 5 mg 2.5 mg 5 mg 2.5 mg 5 mg 2.5 mg 5 mg    Description        Continue warfarin 5mg  1/2 tablet on mondays, wednesdays and fridays and 1 tablet all other days.       INR was 2.1 today

## 2014-08-17 ENCOUNTER — Telehealth: Payer: Self-pay | Admitting: *Deleted

## 2014-08-17 ENCOUNTER — Telehealth: Payer: Self-pay | Admitting: Family Medicine

## 2014-08-17 ENCOUNTER — Other Ambulatory Visit: Payer: Self-pay

## 2014-08-17 DIAGNOSIS — E11610 Type 2 diabetes mellitus with diabetic neuropathic arthropathy: Secondary | ICD-10-CM

## 2014-08-17 MED ORDER — HYDROCODONE-ACETAMINOPHEN 7.5-325 MG PO TABS: 1.0000 | ORAL_TABLET | Freq: Two times a day (BID) | ORAL | Status: DC | PRN

## 2014-08-17 NOTE — Telephone Encounter (Signed)
Brittany Clarke, [employer] wants to know if pt has tried ciclopirox, clotrimazole,clotrimazole-betamethasone, econazole, ketoconazole, if not why will they not work ,

## 2014-08-17 NOTE — Telephone Encounter (Signed)
Last seen 06/17/14 Dr Sabra Heck if approved print

## 2014-08-18 ENCOUNTER — Ambulatory Visit: Payer: Medicare Other | Admitting: Physical Therapy

## 2014-08-18 DIAGNOSIS — G8190 Hemiplegia, unspecified affecting unspecified side: Secondary | ICD-10-CM | POA: Diagnosis not present

## 2014-08-18 DIAGNOSIS — R262 Difficulty in walking, not elsewhere classified: Secondary | ICD-10-CM

## 2014-08-18 DIAGNOSIS — M6281 Muscle weakness (generalized): Secondary | ICD-10-CM | POA: Diagnosis not present

## 2014-08-18 DIAGNOSIS — R29898 Other symptoms and signs involving the musculoskeletal system: Secondary | ICD-10-CM | POA: Diagnosis not present

## 2014-08-18 DIAGNOSIS — G8191 Hemiplegia, unspecified affecting right dominant side: Secondary | ICD-10-CM

## 2014-08-18 NOTE — Telephone Encounter (Signed)
Pt aware written Rx at office to be picked up

## 2014-08-18 NOTE — Therapy (Signed)
[facility] [place], [place], [address] Phone: [phone]   Fax:  [phone]  Physical Therapy Treatment  Patient Details  Name: Brittany Clarke MRN: [id number] Date of Birth: 16-Dec-1940 Referring Provider:  [name], MD  Encounter Date: 08/18/2014      PT End of Session - 08/18/14 1319    Visit Number 6   Number of Visits 24   PT Start Time 0100   PT Stop Time 0144   PT Time Calculation (min) 44 min      Past Medical History  Diagnosis Date  . CHF (congestive heart failure)   . Diabetes mellitus without complication   . COPD (chronic obstructive pulmonary disease)   . Hypertension   . Asthma   . Shortness of breath   . Stroke     2013  . Anxiety   . Pneumonia   . Gout   . 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 Anticoagulant long-term use     Past Surgical History  Procedure Laterality Date  . Breast surgery    . Tubal ligation    . Colonoscopy  04/04/2002    FWY:OVZCHYIF hemorrhoids, otherwise, normal rectum/Scattered left sided diverticula/Somewhat adenomatous appearing ileocecal valve which is probably normal but this area was biopsied. The remainder of the colonic mucosa appeared   normal  . Trigger finger surgery      twice  . Colonoscopy with esophagogastroduodenoscopy (egd) N/A 02/20/2013    Procedure: COLONOSCOPY WITH ESOPHAGOGASTRODUODENOSCOPY (EGD);  Surgeon: Daneil Dolin, MD;  [facility];  Service: Endoscopy;  Laterality: N/A;    There were no vitals filed for this visit.  Visit Diagnosis:  Difficulty walking  Right hemiplegia  Muscle weakness (generalized)  Right leg weakness      Subjective Assessment - 08/18/14 1309    Symptoms Been walking 4 times a day at home.     Treatment:  Gait Training with wide QC and right shoulder sling 100 feet times 4 and 60 feet x 1.  Gait belt assist.                            PT Short Term Goals - 07/23/14 1402    PT SHORT TERM GOAL #1   Title STG's=LTG's.           PT Long Term Goals - 07/23/14 1402    PT LONG TERM GOAL #1   Title Independent with an HEP.   Time 8   Period Weeks   Status New   PT LONG TERM GOAL #2   Title Walk 200 feet with a quad cane with CGA.   PT LONG TERM GOAL #3   Title Improve strength by 1/2 to 1 full muscle grade.   Time 8   Period Weeks   Status New               Problem List Patient Active Problem List   Diagnosis Date Noted  . Unspecified vitamin D deficiency 07/30/2013  . Right hemiplegia 06/25/2013  . Muscle weakness (generalized) 06/25/2013  . Need for prophylactic vaccination and inoculation against influenza 04/16/2013  . 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 Esophageal dysphagia 02/11/2013  . Long term (current) use of anticoagulants 01/24/2013  . Rectal bleeding 01/24/2013  . Chronic constipation 12/02/2012  . Pedal edema 12/02/2012  . Otalgia of left ear  12/02/2012  . History of rectal bleeding 12/02/2012  . HLD (hyperlipidemia) 12/02/2012  . Dysuria 12/02/2012  . Pain in joint, ankle and foot 09/10/2012  . PNA (pneumonia) 06/10/2012  . Hypokalemia 06/10/2012  . CHF (congestive heart failure) 06/10/2012  . Morbid obesity 06/10/2012  . History of CVA (cerebrovascular accident) 06/10/2012  . Anemia 06/10/2012  . DDD (degenerative disc disease), lumbar 06/10/2012  . Allergic rhinitis 06/10/2012  . Gout 06/10/2012  . HTN (hypertension) 06/10/2012  . DM (diabetes mellitus) 06/10/2012  . Constipation 06/10/2012  . Urinary incontinence 06/10/2012  . History of breast cancer 06/10/2012  . COPD (chronic obstructive pulmonary disease) 06/10/2012    APPLEGATE, [place] MPT 08/18/2014, 1:46 PM  [facility] [address] [place], [place], [address] Phone: [phone]   Fax:  [phone]

## 2014-08-19 MED ORDER — CLOTRIMAZOLE-BETAMETHASONE 1-0.05 % EX CREA: 1.0000 "application " | TOPICAL_CREAM | Freq: Two times a day (BID) | CUTANEOUS | Status: DC

## 2014-08-19 NOTE — Telephone Encounter (Signed)
Options from insurance are either just a antifungal and not combo except clotrimazole-betamethasone which does not come in ointment only cream.   Was trying to continue with ointment as this has help because it provides more of a barrier but I discussed with patient and her caregiver and she is willing to try clotrimazole-betamethasone cream for 1 month.  If condition worsens they are to notify office and we will retry prior authorization.

## 2014-08-20 ENCOUNTER — Encounter: Payer: Self-pay | Admitting: Physical Therapy

## 2014-08-20 ENCOUNTER — Ambulatory Visit: Payer: Medicare Other | Admitting: Physical Therapy

## 2014-08-20 DIAGNOSIS — M6281 Muscle weakness (generalized): Secondary | ICD-10-CM

## 2014-08-20 DIAGNOSIS — R262 Difficulty in walking, not elsewhere classified: Secondary | ICD-10-CM | POA: Diagnosis not present

## 2014-08-20 DIAGNOSIS — R29898 Other symptoms and signs involving the musculoskeletal system: Secondary | ICD-10-CM | POA: Diagnosis not present

## 2014-08-20 DIAGNOSIS — G8190 Hemiplegia, unspecified affecting unspecified side: Secondary | ICD-10-CM | POA: Diagnosis not present

## 2014-08-20 DIAGNOSIS — G8191 Hemiplegia, unspecified affecting right dominant side: Secondary | ICD-10-CM

## 2014-08-20 NOTE — Patient Instructions (Signed)
Caregiver instructions:  Please walk with patient 4-5 times per day, 5-6 times were week.  -[place]

## 2014-08-20 NOTE — Therapy (Signed)
[facility] [place], [place], [address] Phone: [phone]   Fax:  [phone]  Physical Therapy Treatment  Patient Details  Name: Brittany Clarke MRN: [id number] Date of Birth: [birth year] Referring Provider:  [name], MD  Encounter Date: 08/20/2014      PT End of Session - 08/20/14 1311    Visit Number 7   Number of Visits 24   PT Start Time 1300   PT Stop Time 7353   PT Time Calculation (min) 45 min   Equipment Utilized During Treatment Gait belt;Other (comment)  R shoulder sling   Activity Tolerance Patient tolerated treatment well   Behavior During Therapy [facility] for tasks assessed/performed      Past Medical History  Diagnosis Date  . CHF (congestive heart failure)   . Diabetes mellitus without complication   . COPD (chronic obstructive pulmonary disease)   . Hypertension   . Asthma   . Shortness of breath   . Stroke     2013  . Anxiety   . Pneumonia   . Gout   . 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 Anticoagulant long-term use     Past Surgical History  Procedure Laterality Date  . Breast surgery    . Tubal ligation    . Colonoscopy  04/04/2002    GDJ:[id number] hemorrhoids, otherwise, normal rectum/Scattered left sided diverticula/Somewhat adenomatous appearing ileocecal valve which is probably normal but this area was biopsied. The remainder of the colonic mucosa appeared   normal  . Trigger finger surgery      twice  . Colonoscopy with esophagogastroduodenoscopy (egd) N/A 02/20/2013    Procedure: COLONOSCOPY WITH ESOPHAGOGASTRODUODENOSCOPY (EGD);  Surgeon: Daneil Dolin, MD;  [facility];  Service: Endoscopy;  Laterality: N/A;    There were no vitals filed for this visit.  Visit Diagnosis:  Difficulty walking  Right hemiplegia  Muscle weakness (generalized)  Right leg weakness      Subjective Assessment - 08/20/14 1300    Symptoms States she's  feeling good. Continues to ambulate 4 times daily at home.    Limitations Walking   Patient Stated Goals Walk and move better.   Currently in Pain? Yes   Pain Score 8    Pain Location Leg   Pain Orientation Right   Pain Descriptors / Indicators Constant;Throbbing            [facility] PT Assessment - 08/20/14 0001    Assessment   Medical Diagnosis ifficulty walking                   [facility] Adult PT Treatment/Exercise - 08/20/14 0001    Ambulation/Gait   Ambulation/Gait Yes   Ambulation/Gait Assistance Other (comment)  CGA   Ambulation/Gait Assistance Details Quad cane- Left side, shoulder sling- right side   Ambulation Distance (Feet) 230 Feet   Assistive device Large base quad cane   Gait Pattern Step-to pattern;Decreased step length - right;Decreased stance time - right;Decreased hip/knee flexion - right;Decreased dorsiflexion - right   Ambulation Surface Level;Indoor   Gait Comments Tactile cueing (quarter) taped to R heel for sensory input for heel to toe. Required multimodal cueing for heel-toe and for neutral ankle during ambulation.   Knee/Hip Exercises: Aerobic   Stationary Bike NuStep workload 4 x 10 minutes   Knee/Hip Exercises: Standing   Other Standing Knee Exercises Standing marching 3 sets 10 reps   Knee/Hip Exercises: Seated  Other Seated Knee Exercises Seated PF with ankle isolator; .5# 3 sets 10 reps   Transfers   Sit to Stand Details Visual cues/gestures for sequencing;Other (comment)  Mod A (WC to stand), CGA (mat table to stand)   Stand to Sit Details (indicate cue type and reason) Verbal cues for precautions/safety                PT Education - 08/20/14 1310    Education provided Yes   Education Details Gave instructions for caregiver to ambulate the patient 4-5 times per day, 5-6 times a week.   Person(s) Educated Patient   Methods Explanation;Handout   Comprehension Verbalized understanding          PT Short Term Goals -  07/23/14 1402    PT SHORT TERM GOAL #1   Title STG's=LTG's.           PT Long Term Goals - 08/20/14 1312    PT LONG TERM GOAL #1   Title (p) Independent with an HEP.   Time (p) 8   Period (p) Weeks   Status (p) On-going   PT LONG TERM GOAL #2   Title (p) Walk 200 feet with a quad cane with CGA.   Time (p) 8   Period (p) Weeks   Status (p) On-going   PT LONG TERM GOAL #3   Title (p) Improve strength by 1/2 to 1 full muscle grade.   Time (p) 8   Period (p) Weeks   Status (p) On-going               Plan - 08/20/14 1408    Clinical Impression Statement Patient tolerated treatment well today and began fatigueing by the end of treatment. Patient much more compliant with cueing during ambulation. Patient was accepting of tactile cueing of quarter on R heel and worked to correct her gait pattern. Experienced 5/10 pain in RLE following treatment.    Pt will benefit from skilled therapeutic intervention in order to improve on the following deficits Abnormal gait;Decreased strength;Difficulty walking   Rehab Potential Good   PT Frequency 3x / week   PT Duration 8 weeks   PT Next Visit Plan Continue with PT POC. Continue with quarter taping on R heel for cueing of heel/toe and ankle isolator exercises.   Consulted and Agree with Plan of Care Patient        Problem List Patient Active Problem List   Diagnosis Date Noted  . Unspecified vitamin D deficiency 07/30/2013  . Right hemiplegia 06/25/2013  . Muscle weakness (generalized) 06/25/2013  . Need for prophylactic vaccination and inoculation against influenza 04/16/2013  . 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 Esophageal dysphagia 02/11/2013  . Long term (current) use of anticoagulants 01/24/2013  . Rectal bleeding 01/24/2013  . Chronic constipation 12/02/2012  . Pedal edema 12/02/2012  . Otalgia of left ear 12/02/2012  . History of rectal bleeding 12/02/2012  . HLD (hyperlipidemia) 12/02/2012  .  Dysuria 12/02/2012  . Pain in joint, ankle and foot 09/10/2012  . PNA (pneumonia) 06/10/2012  . Hypokalemia 06/10/2012  . CHF (congestive heart failure) 06/10/2012  . Morbid obesity 06/10/2012  . History of CVA (cerebrovascular accident) 06/10/2012  . Anemia 06/10/2012  . DDD (degenerative disc disease), lumbar 06/10/2012  . Allergic rhinitis 06/10/2012  . Gout 06/10/2012  . HTN (hypertension) 06/10/2012  . DM (diabetes mellitus) 06/10/2012  . Constipation 06/10/2012  . Urinary incontinence 06/10/2012  . History of breast cancer  06/10/2012  . COPD (chronic obstructive pulmonary disease) 06/10/2012    Wynelle Fanny, PTA 08/20/2014, 2:14 PM  [facility] [address] [place], [place], [address] Phone: [phone]   Fax:  [phone]

## 2014-08-25 ENCOUNTER — Ambulatory Visit: Payer: Medicare Other | Attending: Family Medicine | Admitting: *Deleted

## 2014-08-25 ENCOUNTER — Encounter: Payer: Self-pay | Admitting: *Deleted

## 2014-08-25 DIAGNOSIS — G8191 Hemiplegia, unspecified affecting right dominant side: Secondary | ICD-10-CM

## 2014-08-25 DIAGNOSIS — R262 Difficulty in walking, not elsewhere classified: Secondary | ICD-10-CM

## 2014-08-25 DIAGNOSIS — M6281 Muscle weakness (generalized): Secondary | ICD-10-CM | POA: Diagnosis not present

## 2014-08-25 DIAGNOSIS — R29898 Other symptoms and signs involving the musculoskeletal system: Secondary | ICD-10-CM | POA: Diagnosis not present

## 2014-08-25 DIAGNOSIS — G8190 Hemiplegia, unspecified affecting unspecified side: Secondary | ICD-10-CM | POA: Diagnosis not present

## 2014-08-25 NOTE — Therapy (Signed)
[facility] [place], [place], [address] Phone: [phone]   Fax:  [phone]  Physical Therapy Treatment  Patient Details  Name: Brittany Clarke MRN: [id number] Date of Birth: 1940/10/11 Referring Provider:  [name], MD  Encounter Date: 08/25/2014      PT End of Session - 08/25/14 1347    Visit Number 8   Number of Visits 24   PT Start Time 1300   PT Stop Time 7782   PT Time Calculation (min) 55 min      Past Medical History  Diagnosis Date  . CHF (congestive heart failure)   . Diabetes mellitus without complication   . COPD (chronic obstructive pulmonary disease)   . Hypertension   . Asthma   . Shortness of breath   . Stroke     2013  . Anxiety   . Pneumonia   . Gout   . 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 Anticoagulant long-term use     Past Surgical History  Procedure Laterality Date  . Breast surgery    . Tubal ligation    . Colonoscopy  04/04/2002    UMP:NTIRWERX hemorrhoids, otherwise, normal rectum/Scattered left sided diverticula/Somewhat adenomatous appearing ileocecal valve which is probably normal but this area was biopsied. The remainder of the colonic mucosa appeared   normal  . Trigger finger surgery      twice  . Colonoscopy with esophagogastroduodenoscopy (egd) N/A 02/20/2013    Procedure: COLONOSCOPY WITH ESOPHAGOGASTRODUODENOSCOPY (EGD);  Surgeon: Daneil Dolin, MD;  [facility];  Service: Endoscopy;  Laterality: N/A;    There were no vitals filed for this visit.  Visit Diagnosis:  Difficulty walking  Muscle weakness (generalized)  Right hemiplegia  Right leg weakness      Subjective Assessment - 08/25/14 1304    Subjective States she's feeling good. Continues to ambulate 4 times daily at home.    Limitations Walking   Patient Stated Goals Walk and move better.   Currently in Pain? Yes   Pain Score 8    Pain Location Leg    Pain Orientation Right   Pain Descriptors / Indicators Constant;Throbbing                       [facility] Adult PT Treatment/Exercise - 08/25/14 0001    Ambulation/Gait   Ambulation/Gait Yes   Ambulation/Gait Assistance --  CGA   Ambulation/Gait Assistance Details SBQC LT side, sling on RT shldr   Ambulation Distance (Feet) 230 Feet   Assistive device Small based quad cane   Gait Pattern Step-to pattern;Decreased step length - right;Decreased stance time - right;Decreased hip/knee flexion - right;Decreased dorsiflexion - right   Ambulation Surface Level   Knee/Hip Exercises: Aerobic   Stationary Bike NuStep workload 4 x 5 minutes   Knee/Hip Exercises: Standing   Forward Step Up Right;10 reps;Step Height: 2";1 set   Other Standing Knee Exercises Standing marching 3 sets 10 reps   Knee/Hip Exercises: Seated   Long Arc Quad Strengthening;3 sets;10 reps   Long Arc Quad Weight 3 lbs.   Long Arc Quad Limitations 3x10   Other Seated Knee Exercises Seated PF with ankle isolator; 1/2 # 3 sets 10 reps   Other Seated Knee Exercises sit to stand x 4 mod assist   Transfers   Sit to Stand Details Visual cues/gestures for sequencing;Other (comment)   Stand to Sit Details (indicate  cue type and reason) Verbal cues for precautions/safety                  PT Short Term Goals - 07/23/14 1402    PT SHORT TERM GOAL #1   Title STG's=LTG's.           PT Long Term Goals - 08/20/14 1312    PT LONG TERM GOAL #1   Title (p) Independent with an HEP.   Time (p) 8   Period (p) Weeks   Status (p) On-going   PT LONG TERM GOAL #2   Title (p) Walk 200 feet with a quad cane with CGA.   Time (p) 8   Period (p) Weeks   Status (p) On-going   PT LONG TERM GOAL #3   Title (p) Improve strength by 1/2 to 1 full muscle grade.   Time (p) 8   Period (p) Weeks   Status (p) On-going               Plan - 08/25/14 1359    Clinical Impression Statement Pt did fairly well today  with Rx. Her ambulation pace is getting faster. May benefit from  AFO   Pt will benefit from skilled therapeutic intervention in order to improve on the following deficits Abnormal gait;Decreased strength;Difficulty walking   Rehab Potential Good   PT Frequency 3x / week   PT Duration 8 weeks   PT Next Visit Plan Continue with PT POC. Continue with quarter taping on R heel for cueing of heel/toe and ankle isolator exercises. Talk to MPT about AFO        Problem List Patient Active Problem List   Diagnosis Date Noted  . Unspecified vitamin D deficiency 07/30/2013  . Right hemiplegia 06/25/2013  . Muscle weakness (generalized) 06/25/2013  . Need for prophylactic vaccination and inoculation against influenza 04/16/2013  . 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 Esophageal dysphagia 02/11/2013  . Long term (current) use of anticoagulants 01/24/2013  . Rectal bleeding 01/24/2013  . Chronic constipation 12/02/2012  . Pedal edema 12/02/2012  . Otalgia of left ear 12/02/2012  . History of rectal bleeding 12/02/2012  . HLD (hyperlipidemia) 12/02/2012  . Dysuria 12/02/2012  . Pain in joint, ankle and foot 09/10/2012  . PNA (pneumonia) 06/10/2012  . Hypokalemia 06/10/2012  . CHF (congestive heart failure) 06/10/2012  . Morbid obesity 06/10/2012  . History of CVA (cerebrovascular accident) 06/10/2012  . Anemia 06/10/2012  . DDD (degenerative disc disease), lumbar 06/10/2012  . Allergic rhinitis 06/10/2012  . Gout 06/10/2012  . HTN (hypertension) 06/10/2012  . DM (diabetes mellitus) 06/10/2012  . Constipation 06/10/2012  . Urinary incontinence 06/10/2012  . History of breast cancer 06/10/2012  . COPD (chronic obstructive pulmonary disease) 06/10/2012    Aseem Sessums,CHRIS, PTA 08/25/2014, 2:07 PM  [facility] [address] [place], [place], [address] Phone: [phone]   Fax:  [phone]

## 2014-08-26 ENCOUNTER — Other Ambulatory Visit: Payer: Self-pay | Admitting: Family Medicine

## 2014-08-27 ENCOUNTER — Ambulatory Visit: Payer: Medicare Other | Admitting: Physical Therapy

## 2014-08-27 DIAGNOSIS — G8190 Hemiplegia, unspecified affecting unspecified side: Secondary | ICD-10-CM | POA: Diagnosis not present

## 2014-08-27 DIAGNOSIS — M6281 Muscle weakness (generalized): Secondary | ICD-10-CM | POA: Diagnosis not present

## 2014-08-27 DIAGNOSIS — R262 Difficulty in walking, not elsewhere classified: Secondary | ICD-10-CM | POA: Diagnosis not present

## 2014-08-27 DIAGNOSIS — R29898 Other symptoms and signs involving the musculoskeletal system: Secondary | ICD-10-CM | POA: Diagnosis not present

## 2014-08-27 DIAGNOSIS — G8191 Hemiplegia, unspecified affecting right dominant side: Secondary | ICD-10-CM

## 2014-08-27 NOTE — Therapy (Signed)
[facility] [place], [place], [address] Phone: [phone]   Fax:  [phone]  Physical Therapy Treatment  Patient Details  Name: Brittany Clarke MRN: [id number] Date of Birth: [birth year] Referring Provider:  [name], MD  Encounter Date: 08/27/2014      PT End of Session - 08/27/14 1341    Visit Number 9   Number of Visits 24   PT Start Time 0102   PT Stop Time 0149   PT Time Calculation (min) 47 min      Past Medical History  Diagnosis Date  . CHF (congestive heart failure)   . Diabetes mellitus without complication   . COPD (chronic obstructive pulmonary disease)   . Hypertension   . Asthma   . Shortness of breath   . Stroke     2013  . Anxiety   . Pneumonia   . Gout   . 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 Anticoagulant long-term use     Past Surgical History  Procedure Laterality Date  . Breast surgery    . Tubal ligation    . Colonoscopy  04/04/2002    OVF:IEPPIRJJ hemorrhoids, otherwise, normal rectum/Scattered left sided diverticula/Somewhat adenomatous appearing ileocecal valve which is probably normal but this area was biopsied. The remainder of the colonic mucosa appeared   normal  . Trigger finger surgery      twice  . Colonoscopy with esophagogastroduodenoscopy (egd) N/A 02/20/2013    Procedure: COLONOSCOPY WITH ESOPHAGOGASTRODUODENOSCOPY (EGD);  Surgeon: Daneil Dolin, MD;  [facility];  Service: Endoscopy;  Laterality: N/A;    There were no vitals filed for this visit.  Visit Diagnosis:  Difficulty walking  Muscle weakness (generalized)  Right hemiplegia  Right leg weakness                               PT Short Term Goals - 07/23/14 1402    PT SHORT TERM GOAL #1   Title STG's=LTG's.           PT Long Term Goals - 08/27/14 1342    PT LONG TERM GOAL #1   Title Independent with an HEP.   Time 8    Period Weeks   Status New   PT LONG TERM GOAL #2   Title Walk 200 feet with a quad cane with CGA.   PT LONG TERM GOAL #3   Title Improve strength by 1/2 to 1 full muscle grade.   Time 8   Period Weeks   Status New               Plan - 08/27/14 1341    Pt will benefit from skilled therapeutic intervention in order to improve on the following deficits Abnormal gait;Decreased strength;Difficulty walking   Rehab Potential Good   PT Frequency 3x / week   PT Duration 8 weeks   PT Next Visit Plan G-code.   Consulted and Agree with Plan of Care Patient        Problem List Patient Active Problem List   Diagnosis Date Noted  . Unspecified vitamin D deficiency 07/30/2013  . Right hemiplegia 06/25/2013  . Muscle weakness (generalized) 06/25/2013  . Need for prophylactic vaccination and inoculation against influenza 04/16/2013  . 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 Esophageal dysphagia 02/11/2013  . Long term (current) use of anticoagulants  01/24/2013  . Rectal bleeding 01/24/2013  . Chronic constipation 12/02/2012  . Pedal edema 12/02/2012  . Otalgia of left ear 12/02/2012  . History of rectal bleeding 12/02/2012  . HLD (hyperlipidemia) 12/02/2012  . Dysuria 12/02/2012  . Pain in joint, ankle and foot 09/10/2012  . PNA (pneumonia) 06/10/2012  . Hypokalemia 06/10/2012  . CHF (congestive heart failure) 06/10/2012  . Morbid obesity 06/10/2012  . History of CVA (cerebrovascular accident) 06/10/2012  . Anemia 06/10/2012  . DDD (degenerative disc disease), lumbar 06/10/2012  . Allergic rhinitis 06/10/2012  . Gout 06/10/2012  . HTN (hypertension) 06/10/2012  . DM (diabetes mellitus) 06/10/2012  . Constipation 06/10/2012  . Urinary incontinence 06/10/2012  . History of breast cancer 06/10/2012  . COPD (chronic obstructive pulmonary disease) 06/10/2012    Gait training 100 feet x 2 and 60 feet times 1 with right shoulder sling and using QC on  left.  Nustep level 3 x 17 minutes.  Oline Belk, [place] MPT 08/27/2014, 1:52 PM  [facility] [address] [place], [place], [address] Phone: [phone]   Fax:  [phone]

## 2014-08-31 ENCOUNTER — Other Ambulatory Visit: Payer: Self-pay | Admitting: Family Medicine

## 2014-09-01 ENCOUNTER — Encounter: Payer: Self-pay | Admitting: *Deleted

## 2014-09-01 ENCOUNTER — Ambulatory Visit: Payer: Medicare Other | Admitting: *Deleted

## 2014-09-01 DIAGNOSIS — R29898 Other symptoms and signs involving the musculoskeletal system: Secondary | ICD-10-CM

## 2014-09-01 DIAGNOSIS — G8190 Hemiplegia, unspecified affecting unspecified side: Secondary | ICD-10-CM | POA: Diagnosis not present

## 2014-09-01 DIAGNOSIS — G8191 Hemiplegia, unspecified affecting right dominant side: Secondary | ICD-10-CM

## 2014-09-01 DIAGNOSIS — M6281 Muscle weakness (generalized): Secondary | ICD-10-CM | POA: Diagnosis not present

## 2014-09-01 DIAGNOSIS — R262 Difficulty in walking, not elsewhere classified: Secondary | ICD-10-CM | POA: Diagnosis not present

## 2014-09-01 NOTE — Therapy (Signed)
[facility] [place], [place], [address] Phone: [phone]   Fax:  [phone]  Physical Therapy Treatment  Patient Details  Name: Brittany Clarke MRN: [id number] Date of Birth: 1941/03/18 Referring Provider:  [name], MD  Encounter Date: 09/01/2014      PT End of Session - 09/01/14 1423    PT Start Time 62      Past Medical History  Diagnosis Date  . CHF (congestive heart failure)   . Diabetes mellitus without complication   . COPD (chronic obstructive pulmonary disease)   . Hypertension   . Asthma   . Shortness of breath   . Stroke     2013  . Anxiety   . Pneumonia   . Gout   . 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 Anticoagulant long-term use     Past Surgical History  Procedure Laterality Date  . Breast surgery    . Tubal ligation    . Colonoscopy  04/04/2002    ONG:[id number] hemorrhoids, otherwise, normal rectum/Scattered left sided diverticula/Somewhat adenomatous appearing ileocecal valve which is probably normal but this area was biopsied. The remainder of the colonic mucosa appeared   normal  . Trigger finger surgery      twice  . Colonoscopy with esophagogastroduodenoscopy (egd) N/A 02/20/2013    Procedure: COLONOSCOPY WITH ESOPHAGOGASTRODUODENOSCOPY (EGD);  Surgeon: Daneil Dolin, MD;  [facility];  Service: Endoscopy;  Laterality: N/A;    There were no vitals filed for this visit.  Visit Diagnosis:  Difficulty walking  Muscle weakness (generalized)  Right hemiplegia  Right leg weakness      Subjective Assessment - 09/01/14 1401    Subjective States she's feeling good. Continues to ambulate 4 times daily at home. GO get AFO next WED   Limitations Walking   Patient Stated Goals Walk and move better.   Currently in Pain? Yes   Pain Score 7    Pain Location Leg   Pain Orientation Right   Pain Descriptors / Indicators Aching;Constant   Aggravating Factors  cold   Pain Relieving Factors rest,meds                       [facility] Adult PT Treatment/Exercise - 09/01/14 0001    Ambulation/Gait   Ambulation/Gait Yes   Ambulation/Gait Assistance --  CGA   Ambulation/Gait Assistance Details SBQC, GAIT BELT, CGA   Ambulation Distance (Feet) 200 Feet  x 2 times   Assistive device Small based quad cane   Gait Pattern Step-to pattern;Decreased step length - right;Decreased stance time - right;Decreased hip/knee flexion - right;Decreased dorsiflexion - right  ER of RT hip   Ambulation Surface Level   Exercises   Exercises Knee/Hip   Knee/Hip Exercises: Standing   Rocker Board 3 minutes  Did ok on board ,but difficult to get off. try in sitting   Knee/Hip Exercises: Seated   Other Seated Knee Exercises sit to stand x 5 mod assist                  PT Short Term Goals - 07/23/14 1402    PT SHORT TERM GOAL #1   Title STG's=LTG's.           PT Long Term Goals - 09/01/14 1348    PT LONG TERM GOAL #1   Title Independent with an HEP.   Time 8   Period Weeks  Status On-going   PT LONG TERM GOAL #2   Title Walk 200 feet with a quad cane with CGA.   Status Achieved   PT LONG TERM GOAL #3   Title Improve strength by 1/2 to 1 full muscle grade.   Time 8   Period Weeks   Status On-going               Plan - 09/01/14 1359    Clinical Impression Statement Pt did great today and was able to meeyt LTG for ambulation today, but was fatigued after. G code done clinical judgement   Pt will benefit from skilled therapeutic intervention in order to improve on the following deficits Abnormal gait;Decreased strength;Difficulty walking   Rehab Potential Good   PT Frequency 3x / week   PT Duration 8 weeks   PT Next Visit Plan Pt to see [employer] for AFO        Problem List Patient Active Problem List   Diagnosis Date Noted  . Unspecified vitamin D deficiency 07/30/2013  . Right hemiplegia  06/25/2013  . Muscle weakness (generalized) 06/25/2013  . Need for prophylactic vaccination and inoculation against influenza 04/16/2013  . 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 Esophageal dysphagia 02/11/2013  . Long term (current) use of anticoagulants 01/24/2013  . Rectal bleeding 01/24/2013  . Chronic constipation 12/02/2012  . Pedal edema 12/02/2012  . Otalgia of left ear 12/02/2012  . History of rectal bleeding 12/02/2012  . HLD (hyperlipidemia) 12/02/2012  . Dysuria 12/02/2012  . Pain in joint, ankle and foot 09/10/2012  . PNA (pneumonia) 06/10/2012  . Hypokalemia 06/10/2012  . CHF (congestive heart failure) 06/10/2012  . Morbid obesity 06/10/2012  . History of CVA (cerebrovascular accident) 06/10/2012  . Anemia 06/10/2012  . DDD (degenerative disc disease), lumbar 06/10/2012  . Allergic rhinitis 06/10/2012  . Gout 06/10/2012  . HTN (hypertension) 06/10/2012  . DM (diabetes mellitus) 06/10/2012  . Constipation 06/10/2012  . Urinary incontinence 06/10/2012  . History of breast cancer 06/10/2012  . COPD (chronic obstructive pulmonary disease) 06/10/2012    Kupono Marling,CHRIS, PTA 09/01/2014, 4:14 PM  [facility] [address] [place], [place], [address] Phone: [phone]   Fax:  [phone]

## 2014-09-03 ENCOUNTER — Ambulatory Visit: Payer: Medicare Other | Admitting: *Deleted

## 2014-09-03 ENCOUNTER — Encounter: Payer: Self-pay | Admitting: *Deleted

## 2014-09-03 DIAGNOSIS — R29898 Other symptoms and signs involving the musculoskeletal system: Secondary | ICD-10-CM

## 2014-09-03 DIAGNOSIS — G8190 Hemiplegia, unspecified affecting unspecified side: Secondary | ICD-10-CM | POA: Diagnosis not present

## 2014-09-03 DIAGNOSIS — G8191 Hemiplegia, unspecified affecting right dominant side: Secondary | ICD-10-CM

## 2014-09-03 DIAGNOSIS — R262 Difficulty in walking, not elsewhere classified: Secondary | ICD-10-CM

## 2014-09-03 DIAGNOSIS — M6281 Muscle weakness (generalized): Secondary | ICD-10-CM

## 2014-09-03 NOTE — Therapy (Signed)
[facility] [place], [place], [address] Phone: [phone]   Fax:  [phone]  Physical Therapy Treatment  Patient Details  Name: Brittany Clarke MRN: [id number] Date of Birth: Jul 30, 1940 Referring Provider:  [name], MD  Encounter Date: 09/03/2014      PT End of Session - 09/03/14 1338    Visit Number 11   Number of Visits 24   PT Start Time 1300   PT Stop Time 1345   PT Time Calculation (min) 45 min      Past Medical History  Diagnosis Date  . CHF (congestive heart failure)   . Diabetes mellitus without complication   . COPD (chronic obstructive pulmonary disease)   . Hypertension   . Asthma   . Shortness of breath   . Stroke     2013  . Anxiety   . Pneumonia   . Gout   . 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 Anticoagulant long-term use     Past Surgical History  Procedure Laterality Date  . Breast surgery    . Tubal ligation    . Colonoscopy  04/04/2002    [facility]:[id number] hemorrhoids, otherwise, normal rectum/Scattered left sided diverticula/Somewhat adenomatous appearing ileocecal valve which is probably normal but this area was biopsied. The remainder of the colonic mucosa appeared   normal  . Trigger finger surgery      twice  . Colonoscopy with esophagogastroduodenoscopy (egd) N/A 02/20/2013    Procedure: COLONOSCOPY WITH ESOPHAGOGASTRODUODENOSCOPY (EGD);  Surgeon: Daneil Dolin, MD;  [facility]: [facility];  Service: Endoscopy;  Laterality: N/A;    There were no vitals filed for this visit.  Visit Diagnosis:  Difficulty walking  Muscle weakness (generalized)  Right hemiplegia  Right leg weakness      Subjective Assessment - 09/03/14 1301    Subjective States she's feeling good. Continues to ambulate 4 times daily at home. GO get AFO next WED   Limitations Walking   Patient Stated Goals Walk and move better.   Currently in Pain? Yes   Pain Score 9    Pain Orientation Right   Pain Descriptors / Indicators Aching;Constant   Aggravating Factors  cold   Pain Relieving Factors rest ,meds                       [facility] Adult PT Treatment/Exercise - 09/03/14 0001    Transfers   Transfers Stand to Sit   Sit to Stand 3: Mod assist   Ambulation/Gait   Ambulation/Gait Yes   Ambulation/Gait Assistance Other (comment)  CGA   Ambulation Distance (Feet) 200 Feet  x 2 with CGA   Assistive device Small based quad cane   Gait Pattern Step-to pattern   Ambulation Surface Level   Exercises   Exercises Knee/Hip;Ankle   Ankle Exercises: Seated   Other Seated Ankle Exercises Dynadisc RT foot all motions   Other Seated Ankle Exercises Rockerboard PF/DF                  PT Short Term Goals - 07/23/14 1402    PT SHORT TERM GOAL #1   Title STG's=LTG's.           PT Long Term Goals - 09/03/14 1338    PT LONG TERM GOAL #2   Title Walk 200 feet with a quad cane with CGA.   Status Achieved   PT LONG TERM GOAL #  3   Title Improve strength by 1/2 to 1 full muscle grade.   Time 8   Period Weeks   Status On-going               Plan - 09/03/14 1342    Clinical Impression Statement Pt did great today again and was able towalk 200 ft again x2. Other goals are ongoing still. No change in MM strength for RT LE   Pt will benefit from skilled therapeutic intervention in order to improve on the following deficits Abnormal gait;Decreased strength;Difficulty walking   Rehab Potential Good   PT Frequency 3x / week   PT Duration 8 weeks   PT Next Visit Plan Pt to see [employer] for AFO        Problem List Patient Active Problem List   Diagnosis Date Noted  . Unspecified vitamin D deficiency 07/30/2013  . Right hemiplegia 06/25/2013  . Muscle weakness (generalized) 06/25/2013  . Need for prophylactic vaccination and inoculation against influenza 04/16/2013  . 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  Esophageal dysphagia 02/11/2013  . Long term (current) use of anticoagulants 01/24/2013  . Rectal bleeding 01/24/2013  . Chronic constipation 12/02/2012  . Pedal edema 12/02/2012  . Otalgia of left ear 12/02/2012  . History of rectal bleeding 12/02/2012  . HLD (hyperlipidemia) 12/02/2012  . Dysuria 12/02/2012  . Pain in joint, ankle and foot 09/10/2012  . PNA (pneumonia) 06/10/2012  . Hypokalemia 06/10/2012  . CHF (congestive heart failure) 06/10/2012  . Morbid obesity 06/10/2012  . History of CVA (cerebrovascular accident) 06/10/2012  . Anemia 06/10/2012  . DDD (degenerative disc disease), lumbar 06/10/2012  . Allergic rhinitis 06/10/2012  . Gout 06/10/2012  . HTN (hypertension) 06/10/2012  . DM (diabetes mellitus) 06/10/2012  . Constipation 06/10/2012  . Urinary incontinence 06/10/2012  . History of breast cancer 06/10/2012  . COPD (chronic obstructive pulmonary disease) 06/10/2012    Jossiah Smoak,CHRIS, PTA 09/03/2014, 2:06 PM  [facility] [address] [place], [place], [address] Phone: [phone]   Fax:  [phone]

## 2014-09-04 ENCOUNTER — Other Ambulatory Visit: Payer: Self-pay | Admitting: Family Medicine

## 2014-09-04 DIAGNOSIS — J449 Chronic obstructive pulmonary disease, unspecified: Secondary | ICD-10-CM | POA: Diagnosis not present

## 2014-09-04 DIAGNOSIS — R29898 Other symptoms and signs involving the musculoskeletal system: Secondary | ICD-10-CM | POA: Diagnosis not present

## 2014-09-04 DIAGNOSIS — M6289 Other specified disorders of muscle: Secondary | ICD-10-CM | POA: Diagnosis not present

## 2014-09-04 DIAGNOSIS — I519 Heart disease, unspecified: Secondary | ICD-10-CM | POA: Diagnosis not present

## 2014-09-04 DIAGNOSIS — R0902 Hypoxemia: Secondary | ICD-10-CM | POA: Diagnosis not present

## 2014-09-07 DIAGNOSIS — J449 Chronic obstructive pulmonary disease, unspecified: Secondary | ICD-10-CM | POA: Diagnosis not present

## 2014-09-07 DIAGNOSIS — D649 Anemia, unspecified: Secondary | ICD-10-CM | POA: Diagnosis not present

## 2014-09-07 DIAGNOSIS — E119 Type 2 diabetes mellitus without complications: Secondary | ICD-10-CM | POA: Diagnosis not present

## 2014-09-07 DIAGNOSIS — R32 Unspecified urinary incontinence: Secondary | ICD-10-CM | POA: Diagnosis not present

## 2014-09-08 ENCOUNTER — Ambulatory Visit (INDEPENDENT_AMBULATORY_CARE_PROVIDER_SITE_OTHER): Payer: Medicare Other | Admitting: *Deleted

## 2014-09-08 ENCOUNTER — Encounter: Payer: Self-pay | Admitting: *Deleted

## 2014-09-08 ENCOUNTER — Ambulatory Visit: Payer: Medicare Other | Admitting: Physical Therapy

## 2014-09-08 VITALS — BP 123/77 | HR 90 | Ht 64.0 in | Wt 225.0 lb

## 2014-09-08 DIAGNOSIS — R262 Difficulty in walking, not elsewhere classified: Secondary | ICD-10-CM

## 2014-09-08 DIAGNOSIS — G8190 Hemiplegia, unspecified affecting unspecified side: Secondary | ICD-10-CM | POA: Diagnosis not present

## 2014-09-08 DIAGNOSIS — Z0000 Encounter for general adult medical examination without abnormal findings: Secondary | ICD-10-CM | POA: Diagnosis not present

## 2014-09-08 DIAGNOSIS — M6281 Muscle weakness (generalized): Secondary | ICD-10-CM

## 2014-09-08 DIAGNOSIS — Z23 Encounter for immunization: Secondary | ICD-10-CM

## 2014-09-08 DIAGNOSIS — R29898 Other symptoms and signs involving the musculoskeletal system: Secondary | ICD-10-CM | POA: Diagnosis not present

## 2014-09-08 NOTE — Therapy (Signed)
[facility] [place], [place], [address] Phone: [phone]   Fax:  [phone]  Physical Therapy Treatment  Patient Details  Name: Brittany Clarke MRN: [id number] Date of Birth: Oct 19, 1940 Referring Provider:  [name], MD  Encounter Date: 09/08/2014      PT End of Session - 09/08/14 1330    Visit Number 12   Number of Visits 24   PT Start Time 1300   PT Stop Time 3295   PT Time Calculation (min) 49 min      Past Medical History  Diagnosis Date  . CHF (congestive heart failure)   . Diabetes mellitus without complication   . COPD (chronic obstructive pulmonary disease)   . Hypertension   . Asthma   . Shortness of breath   . Stroke     2013  . Anxiety   . Pneumonia   . Gout   . 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 Anticoagulant long-term use     Past Surgical History  Procedure Laterality Date  . Breast surgery    . Tubal ligation    . Colonoscopy  04/04/2002    [facility]:[id number] hemorrhoids, otherwise, normal rectum/Scattered left sided diverticula/Somewhat adenomatous appearing ileocecal valve which is probably normal but this area was biopsied. The remainder of the colonic mucosa appeared   normal  . Trigger finger surgery      twice  . Colonoscopy with esophagogastroduodenoscopy (egd) N/A 02/20/2013    Procedure: COLONOSCOPY WITH ESOPHAGOGASTRODUODENOSCOPY (EGD);  Surgeon: Daneil Dolin, MD;  Location: [facility];  Service: Endoscopy;  Laterality: N/A;    There were no vitals filed for this visit.  Visit Diagnosis:  Difficulty walking  Muscle weakness (generalized)      Subjective Assessment - 09/08/14 1322    Subjective Getting AFO tomorrow.  Will also start OT for my right hand.                                   PT Short Term Goals - 07/23/14 1402    PT SHORT TERM GOAL #1   Title STG's=LTG's.           PT Long Term Goals -  09/03/14 1338    PT LONG TERM GOAL #2   Title Walk 200 feet with a quad cane with CGA.   Status Achieved   PT LONG TERM GOAL #3   Title Improve strength by 1/2 to 1 full muscle grade.   Time 8   Period Weeks   Status On-going     Treatment:  Gait training with quad cane 170 feet x 3 and 50 feet x 1.          Problem List Patient Active Problem List   Diagnosis Date Noted  . Unspecified vitamin D deficiency 07/30/2013  . Right hemiplegia 06/25/2013  . Muscle weakness (generalized) 06/25/2013  . Need for prophylactic vaccination and inoculation against influenza 04/16/2013  . 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 Esophageal dysphagia 02/11/2013  . Long term (current) use of anticoagulants 01/24/2013  . Rectal bleeding 01/24/2013  . Chronic constipation 12/02/2012  . Pedal edema 12/02/2012  . Otalgia of left ear 12/02/2012  . History of rectal bleeding 12/02/2012  . HLD (hyperlipidemia) 12/02/2012  . Dysuria 12/02/2012  . Pain in joint, ankle and foot 09/10/2012  . PNA (pneumonia)  06/10/2012  . Hypokalemia 06/10/2012  . CHF (congestive heart failure) 06/10/2012  . Morbid obesity 06/10/2012  . History of CVA (cerebrovascular accident) 06/10/2012  . Anemia 06/10/2012  . DDD (degenerative disc disease), lumbar 06/10/2012  . Allergic rhinitis 06/10/2012  . Gout 06/10/2012  . HTN (hypertension) 06/10/2012  . DM (diabetes mellitus) 06/10/2012  . Constipation 06/10/2012  . Urinary incontinence 06/10/2012  . History of breast cancer 06/10/2012  . COPD (chronic obstructive pulmonary disease) 06/10/2012    Raizy Auzenne, [place] MPT 09/08/2014, 1:49 PM  [facility] [address] [place], [place], [address] Phone: [phone]   Fax:  [phone]

## 2014-09-08 NOTE — Patient Instructions (Signed)
Information about Advanced Directives- [email or web address]  You received a vaccine for Shingles today  We scheduled you for an appointment for an eye exam on May 18th at 2pm with Dr. Rona Ravens at [facility] in [place], [place] cause injuries and can affect all age groups. It is possible to use preventive measures to significantly decrease the likelihood of falls. There are many simple measures which can make your home safer and prevent falls. OUTDOORS  Repair cracks and edges of walkways and driveways.  Remove high doorway thresholds.  Trim shrubbery on the main path into your home.  Have good outside lighting.  Clear walkways of tools, rocks, debris, and clutter.  Check that handrails are not broken and are securely fastened. Both sides of steps should have handrails.  Have leaves, snow, and ice cleared regularly.  Use sand or salt on walkways during winter months.  In the garage, clean up grease or oil spills. BATHROOM  Install night lights.  Install grab bars by the toilet and in the tub and shower.  Use non-skid mats or decals in the tub or shower.  Place a plastic non-slip stool in the shower to sit on, if needed.  Keep floors dry and clean up all water on the floor immediately.  Remove soap buildup in the tub or shower on a regular basis.  Secure bath mats with non-slip, double-sided rug tape.  Remove throw rugs and tripping hazards from the floors. BEDROOMS  Install night lights.  Make sure a bedside light is easy to reach.  Do not use oversized bedding.  Keep a telephone by your bedside.  Have a firm chair with side arms to use for getting dressed.  Remove throw rugs and tripping hazards from the floor. KITCHEN  Keep handles on pots and pans turned toward the center of the stove. Use back burners when possible.  Clean up spills quickly and allow time for drying.  Avoid walking on wet floors.  Avoid hot utensils  and knives.  Position shelves so they are not too high or low.  Place commonly used objects within easy reach.  If necessary, use a sturdy step stool with a grab bar when reaching.  Keep electrical cables out of the way.  Do not use floor polish or wax that makes floors slippery. If you must use wax, use non-skid floor wax.  Remove throw rugs and tripping hazards from the floor. STAIRWAYS  Never leave objects on stairs.  Place handrails on both sides of stairways and use them. Fix any loose handrails. Make sure handrails on both sides of the stairways are as long as the stairs.  Check carpeting to make sure it is firmly attached along stairs. Make repairs to worn or loose carpet promptly.  Avoid placing throw rugs at the top or bottom of stairways, or properly secure the rug with carpet tape to prevent slippage. Get rid of throw rugs, if possible.  Have an electrician put in a light switch at the top and bottom of the stairs. OTHER FALL PREVENTION TIPS  Wear low-heel or rubber-soled shoes that are supportive and fit well. Wear closed toe shoes.  When using a stepladder, make sure it is fully opened and both spreaders are firmly locked. Do not climb a closed stepladder.  Add color or contrast paint or tape to grab bars and handrails in your home. Place contrasting color strips on first and last steps.  Learn and use mobility aids as  needed. Install an electrical emergency response system.  Turn on lights to avoid dark areas. Replace light bulbs that burn out immediately. Get light switches that glow.  Arrange furniture to create clear pathways. Keep furniture in the same place.  Firmly attach carpet with non-skid or double-sided tape.  Eliminate uneven floor surfaces.  Select a carpet pattern that does not visually hide the edge of steps.  Be aware of all pets. OTHER HOME SAFETY TIPS  Set the water temperature for 120 F (48.8 C).  Keep emergency numbers on or near  the telephone.  Keep smoke detectors on every level of the home and near sleeping areas. Document Released: 04/28/2002 Document Revised: 11/07/2011 Document Reviewed: 07/28/2011 [facility] Patient Information 2015 [place], [place]. This information is not intended to replace advice given to you by your health care provider. Make sure you discuss any questions you have with your health care provider.   Preventive Care for Adults A healthy lifestyle and preventive care can promote health and wellness. Preventive health guidelines for women include the following key practices.  A routine yearly physical is a good way to check with your health care provider about your health and preventive screening. It is a chance to share any concerns and updates on your health and to receive a thorough exam.  Visit your dentist for a routine exam and preventive care every 6 months. Brush your teeth twice a day and floss once a day. Good oral hygiene prevents tooth decay and gum disease.  The frequency of eye exams is based on your age, health, family medical history, use of contact lenses, and other factors. Follow your health care provider's recommendations for frequency of eye exams.  Eat a healthy diet. Foods like vegetables, fruits, whole grains, low-fat dairy products, and lean protein foods contain the nutrients you need without too many calories. Decrease your intake of foods high in solid fats, added sugars, and salt. Eat the right amount of calories for you.Get information about a proper diet from your health care provider, if necessary.  Regular physical exercise is one of the most important things you can do for your health. Most adults should get at least 150 minutes of moderate-intensity exercise (any activity that increases your heart rate and causes you to sweat) each week. In addition, most adults need muscle-strengthening exercises on 2 or more days a week.  Maintain a healthy weight. The body mass  index (BMI) is a screening tool to identify possible weight problems. It provides an estimate of body fat based on height and weight. Your health care provider can find your BMI and can help you achieve or maintain a healthy weight.For adults 20 years and older:  A BMI below 18.5 is considered underweight.  A BMI of 18.5 to 24.9 is normal.  A BMI of 25 to 29.9 is considered overweight.  A BMI of 30 and above is considered obese.  Maintain normal blood lipids and cholesterol levels by exercising and minimizing your intake of saturated fat. Eat a balanced diet with plenty of fruit and vegetables. Blood tests for lipids and cholesterol should begin at age 58 and be repeated every 5 years. If your lipid or cholesterol levels are high, you are over 50, or you are at high risk for heart disease, you may need your cholesterol levels checked more frequently.Ongoing high lipid and cholesterol levels should be treated with medicines if diet and exercise are not working.  If you smoke, find out from your health care  provider how to quit. If you do not use tobacco, do not start.  Lung cancer screening is recommended for adults aged 7-80 years who are at high risk for developing lung cancer because of a history of smoking. A yearly low-dose CT scan of the lungs is recommended for people who have at least a 30-pack-year history of smoking and are a current smoker or have quit within the past 15 years. A pack year of smoking is smoking an average of 1 pack of cigarettes a day for 1 year (for example: 1 pack a day for 30 years or 2 packs a day for 15 years). Yearly screening should continue until the smoker has stopped smoking for at least 15 years. Yearly screening should be stopped for people who develop a health problem that would prevent them from having lung cancer treatment.  If you are pregnant, do not drink alcohol. If you are breastfeeding, be very cautious about drinking alcohol. If you are not pregnant  and choose to drink alcohol, do not have more than 1 drink per day. One drink is considered to be 12 ounces (355 mL) of beer, 5 ounces (148 mL) of wine, or 1.5 ounces (44 mL) of liquor.  Avoid use of street drugs. Do not share needles with anyone. Ask for help if you need support or instructions about stopping the use of drugs.  High blood pressure causes heart disease and increases the risk of stroke. Your blood pressure should be checked at least every 1 to 2 years. Ongoing high blood pressure should be treated with medicines if weight loss and exercise do not work.  If you are 17-26 years old, ask your health care provider if you should take aspirin to prevent strokes.  Diabetes screening involves taking a blood sample to check your fasting blood sugar level. This should be done once every 3 years, after age 46, if you are within normal weight and without risk factors for diabetes. Testing should be considered at a younger age or be carried out more frequently if you are overweight and have at least 1 risk factor for diabetes.  Breast cancer screening is essential preventive care for women. You should practice "breast self-awareness." This means understanding the normal appearance and feel of your breasts and may include breast self-examination. Any changes detected, no matter how small, should be reported to a health care provider. Women in their 19s and 30s should have a clinical breast exam (CBE) by a health care provider as part of a regular health exam every 1 to 3 years. After age 65, women should have a CBE every year. Starting at age 80, women should consider having a mammogram (breast X-ray test) every year. Women who have a family history of breast cancer should talk to their health care provider about genetic screening. Women at a high risk of breast cancer should talk to their health care providers about having an MRI and a mammogram every year.  Breast cancer gene (BRCA)-related cancer  risk assessment is recommended for women who have family members with BRCA-related cancers. BRCA-related cancers include breast, ovarian, tubal, and peritoneal cancers. Having family members with these cancers may be associated with an increased risk for harmful changes (mutations) in the breast cancer genes BRCA1 and BRCA2. Results of the assessment will determine the need for genetic counseling and BRCA1 and BRCA2 testing.  Routine pelvic exams to screen for cancer are no longer recommended for nonpregnant women who are considered low risk for cancer  of the pelvic organs (ovaries, uterus, and vagina) and who do not have symptoms. Ask your health care provider if a screening pelvic exam is right for you.  If you have had past treatment for cervical cancer or a condition that could lead to cancer, you need Pap tests and screening for cancer for at least 20 years after your treatment. If Pap tests have been discontinued, your risk factors (such as having a new sexual partner) need to be reassessed to determine if screening should be resumed. Some women have medical problems that increase the chance of getting cervical cancer. In these cases, your health care provider may recommend more frequent screening and Pap tests.  The HPV test is an additional test that may be used for cervical cancer screening. The HPV test looks for the virus that can cause the cell changes on the cervix. The cells collected during the Pap test can be tested for HPV. The HPV test could be used to screen women aged 42 years and older, and should be used in women of any age who have unclear Pap test results. After the age of 23, women should have HPV testing at the same frequency as a Pap test.  Colorectal cancer can be detected and often prevented. Most routine colorectal cancer screening begins at the age of 60 years and continues through age 55 years. However, your health care provider may recommend screening at an earlier age if you  have risk factors for colon cancer. On a yearly basis, your health care provider may provide home test kits to check for hidden blood in the stool. Use of a small camera at the end of a tube, to directly examine the colon (sigmoidoscopy or colonoscopy), can detect the earliest forms of colorectal cancer. Talk to your health care provider about this at age 48, when routine screening begins. Direct exam of the colon should be repeated every 5-10 years through age 10 years, unless early forms of pre-cancerous polyps or small growths are found.  People who are at an increased risk for hepatitis B should be screened for this virus. You are considered at high risk for hepatitis B if:  You were born in a country where hepatitis B occurs often. Talk with your health care provider about which countries are considered high risk.  Your parents were born in a high-risk country and you have not received a shot to protect against hepatitis B (hepatitis B vaccine).  You have HIV or AIDS.  You use needles to inject street drugs.  You live with, or have sex with, someone who has hepatitis B.  You get hemodialysis treatment.  You take certain medicines for conditions like cancer, organ transplantation, and autoimmune conditions.  Hepatitis C blood testing is recommended for all people born from [birth year] through 1964 and any individual with known risks for hepatitis C.  Practice safe sex. Use condoms and avoid high-risk sexual practices to reduce the spread of sexually transmitted infections (STIs). STIs include gonorrhea, chlamydia, syphilis, trichomonas, herpes, HPV, and human immunodeficiency virus (HIV). Herpes, HIV, and HPV are viral illnesses that have no cure. They can result in disability, cancer, and death.  You should be screened for sexually transmitted illnesses (STIs) including gonorrhea and chlamydia if:  You are sexually active and are younger than 24 years.  You are older than 24 years and your  health care provider tells you that you are at risk for this type of infection.  Your sexual activity has changed  since you were last screened and you are at an increased risk for chlamydia or gonorrhea. Ask your health care provider if you are at risk.  If you are at risk of being infected with HIV, it is recommended that you take a prescription medicine daily to prevent HIV infection. This is called preexposure prophylaxis (PrEP). You are considered at risk if:  You are a heterosexual woman, are sexually active, and are at increased risk for HIV infection.  You take drugs by injection.  You are sexually active with a partner who has HIV.  Talk with your health care provider about whether you are at high risk of being infected with HIV. If you choose to begin PrEP, you should first be tested for HIV. You should then be tested every 3 months for as long as you are taking PrEP.  Osteoporosis is a disease in which the bones lose minerals and strength with aging. This can result in serious bone fractures or breaks. The risk of osteoporosis can be identified using a bone density scan. Women ages 59 years and over and women at risk for fractures or osteoporosis should discuss screening with their health care providers. Ask your health care provider whether you should take a calcium supplement or vitamin D to reduce the rate of osteoporosis.  Menopause can be associated with physical symptoms and risks. Hormone replacement therapy is available to decrease symptoms and risks. You should talk to your health care provider about whether hormone replacement therapy is right for you.  Use sunscreen. Apply sunscreen liberally and repeatedly throughout the day. You should seek shade when your shadow is shorter than you. Protect yourself by wearing long sleeves, pants, a wide-brimmed hat, and sunglasses year round, whenever you are outdoors.  Once a month, do a whole body skin exam, using a mirror to look at  the skin on your back. Tell your health care provider of new moles, moles that have irregular borders, moles that are larger than a pencil eraser, or moles that have changed in shape or color.  Stay current with required vaccines (immunizations).  Influenza vaccine. All adults should be immunized every year.  Tetanus, diphtheria, and acellular pertussis (Td, Tdap) vaccine. Pregnant women should receive 1 dose of Tdap vaccine during each pregnancy. The dose should be obtained regardless of the length of time since the last dose. Immunization is preferred during the 27th-36th week of gestation. An adult who has not previously received Tdap or who does not know her vaccine status should receive 1 dose of Tdap. This initial dose should be followed by tetanus and diphtheria toxoids (Td) booster doses every 10 years. Adults with an unknown or incomplete history of completing a 3-dose immunization series with Td-containing vaccines should begin or complete a primary immunization series including a Tdap dose. Adults should receive a Td booster every 10 years.  Varicella vaccine. An adult without evidence of immunity to varicella should receive 2 doses or a second dose if she has previously received 1 dose. Pregnant females who do not have evidence of immunity should receive the first dose after pregnancy. This first dose should be obtained before leaving the health care facility. The second dose should be obtained 4-8 weeks after the first dose.  Human papillomavirus (HPV) vaccine. Females aged 13-26 years who have not received the vaccine previously should obtain the 3-dose series. The vaccine is not recommended for use in pregnant females. However, pregnancy testing is not needed before receiving a dose. If a  female is found to be pregnant after receiving a dose, no treatment is needed. In that case, the remaining doses should be delayed until after the pregnancy. Immunization is recommended for any person with  an immunocompromised condition through the age of 62 years if she did not get any or all doses earlier. During the 3-dose series, the second dose should be obtained 4-8 weeks after the first dose. The third dose should be obtained 24 weeks after the first dose and 16 weeks after the second dose.  Zoster vaccine. One dose is recommended for adults aged 68 years or older unless certain conditions are present.  Measles, mumps, and rubella (MMR) vaccine. Adults born before [birth year] generally are considered immune to measles and mumps. Adults born in [birth year] or later should have 1 or more doses of MMR vaccine unless there is a contraindication to the vaccine or there is laboratory evidence of immunity to each of the three diseases. A routine second dose of MMR vaccine should be obtained at least 28 days after the first dose for students attending postsecondary schools, health care workers, or international travelers. People who received inactivated measles vaccine or an unknown type of measles vaccine during 1963-1967 should receive 2 doses of MMR vaccine. People who received inactivated mumps vaccine or an unknown type of mumps vaccine before 1979 and are at high risk for mumps infection should consider immunization with 2 doses of MMR vaccine. For females of childbearing age, rubella immunity should be determined. If there is no evidence of immunity, females who are not pregnant should be vaccinated. If there is no evidence of immunity, females who are pregnant should delay immunization until after pregnancy. Unvaccinated health care workers born before [birth year] who lack laboratory evidence of measles, mumps, or rubella immunity or laboratory confirmation of disease should consider measles and mumps immunization with 2 doses of MMR vaccine or rubella immunization with 1 dose of MMR vaccine.  Pneumococcal 13-valent conjugate (PCV13) vaccine. When indicated, a person who is uncertain of her immunization history and has no  record of immunization should receive the PCV13 vaccine. An adult aged 42 years or older who has certain medical conditions and has not been previously immunized should receive 1 dose of PCV13 vaccine. This PCV13 should be followed with a dose of pneumococcal polysaccharide (PPSV23) vaccine. The PPSV23 vaccine dose should be obtained at least 8 weeks after the dose of PCV13 vaccine. An adult aged 61 years or older who has certain medical conditions and previously received 1 or more doses of PPSV23 vaccine should receive 1 dose of PCV13. The PCV13 vaccine dose should be obtained 1 or more years after the last PPSV23 vaccine dose.  Pneumococcal polysaccharide (PPSV23) vaccine. When PCV13 is also indicated, PCV13 should be obtained first. All adults aged 30 years and older should be immunized. An adult younger than age 4 years who has certain medical conditions should be immunized. Any person who resides in a nursing home or long-term care facility should be immunized. An adult smoker should be immunized. People with an immunocompromised condition and certain other conditions should receive both PCV13 and PPSV23 vaccines. People with human immunodeficiency virus (HIV) infection should be immunized as soon as possible after diagnosis. Immunization during chemotherapy or radiation therapy should be avoided. Routine use of PPSV23 vaccine is not recommended for American Indians, [place], or people younger than 65 years unless there are medical conditions that require PPSV23 vaccine. When indicated, people who have unknown immunization and have no  record of immunization should receive PPSV23 vaccine. One-time revaccination 5 years after the first dose of PPSV23 is recommended for people aged 19-64 years who have chronic kidney failure, nephrotic syndrome, asplenia, or immunocompromised conditions. People who received 1-2 doses of PPSV23 before age 32 years should receive another dose of PPSV23 vaccine at age 61  years or later if at least 5 years have passed since the previous dose. Doses of PPSV23 are not needed for people immunized with PPSV23 at or after age 54 years.  Meningococcal vaccine. Adults with asplenia or persistent complement component deficiencies should receive 2 doses of quadrivalent meningococcal conjugate (MenACWY-D) vaccine. The doses should be obtained at least 2 months apart. Microbiologists working with certain meningococcal bacteria, [employer] recruits, people at risk during an outbreak, and people who travel to or live in countries with a high rate of meningitis should be immunized. A first-year college student up through age 69 years who is living in a residence hall should receive a dose if she did not receive a dose on or after her 16th birthday. Adults who have certain high-risk conditions should receive one or more doses of vaccine.  Hepatitis A vaccine. Adults who wish to be protected from this disease, have certain high-risk conditions, work with hepatitis A-infected animals, work in hepatitis A research labs, or travel to or work in countries with a high rate of hepatitis A should be immunized. Adults who were previously unvaccinated and who anticipate close contact with an international adoptee during the first 60 days after arrival in the [place] from a country with a high rate of hepatitis A should be immunized.  Hepatitis B vaccine. Adults who wish to be protected from this disease, have certain high-risk conditions, may be exposed to blood or other infectious body fluids, are household contacts or sex partners of hepatitis B positive people, are clients or workers in certain care facilities, or travel to or work in countries with a high rate of hepatitis B should be immunized.  Haemophilus influenzae type b (Hib) vaccine. A previously unvaccinated person with asplenia or sickle cell disease or having a scheduled splenectomy should receive 1 dose of Hib vaccine. Regardless  of previous immunization, a recipient of a hematopoietic stem cell transplant should receive a 3-dose series 6-12 months after her successful transplant. Hib vaccine is not recommended for adults with HIV infection. Preventive Services / Frequency Ages 24 to 70 years  Blood pressure check.** / Every 1 to 2 years.  Lipid and cholesterol check.** / Every 5 years beginning at age 35.  Clinical breast exam.** / Every 3 years for women in their 2s and 54s.  BRCA-related cancer risk assessment.** / For women who have family members with a BRCA-related cancer (breast, ovarian, tubal, or peritoneal cancers).  Pap test.** / Every 2 years from ages 30 through 32. Every 3 years starting at age 57 through age 42 or 69 with a history of 3 consecutive normal Pap tests.  HPV screening.** / Every 3 years from ages 33 through ages 19 to 62 with a history of 3 consecutive normal Pap tests.  Hepatitis C blood test.** / For any individual with known risks for hepatitis C.  Skin self-exam. / Monthly.  Influenza vaccine. / Every year.  Tetanus, diphtheria, and acellular pertussis (Tdap, Td) vaccine.** / Consult your health care provider. Pregnant women should receive 1 dose of Tdap vaccine during each pregnancy. 1 dose of Td every 10 years.  Varicella vaccine.** / Consult your health  care provider. Pregnant females who do not have evidence of immunity should receive the first dose after pregnancy.  HPV vaccine. / 3 doses over 6 months, if 15 and younger. The vaccine is not recommended for use in pregnant females. However, pregnancy testing is not needed before receiving a dose.  Measles, mumps, rubella (MMR) vaccine.** / You need at least 1 dose of MMR if you were born in 1957 or later. You may also need a 2nd dose. For females of childbearing age, rubella immunity should be determined. If there is no evidence of immunity, females who are not pregnant should be vaccinated. If there is no evidence of immunity,  females who are pregnant should delay immunization until after pregnancy.  Pneumococcal 13-valent conjugate (PCV13) vaccine.** / Consult your health care provider.  Pneumococcal polysaccharide (PPSV23) vaccine.** / 1 to 2 doses if you smoke cigarettes or if you have certain conditions.  Meningococcal vaccine.** / 1 dose if you are age 49 to 37 years and a Market researcher living in a residence hall, or have one of several medical conditions, you need to get vaccinated against meningococcal disease. You may also need additional booster doses.  Hepatitis A vaccine.** / Consult your health care provider.  Hepatitis B vaccine.** / Consult your health care provider.  Haemophilus influenzae type b (Hib) vaccine.** / Consult your health care provider. Ages 80 to 65 years  Blood pressure check.** / Every 1 to 2 years.  Lipid and cholesterol check.** / Every 5 years beginning at age 52 years.  Lung cancer screening. / Every year if you are aged 24-80 years and have a 30-pack-year history of smoking and currently smoke or have quit within the past 15 years. Yearly screening is stopped once you have quit smoking for at least 15 years or develop a health problem that would prevent you from having lung cancer treatment.  Clinical breast exam.** / Every year after age 61 years.  BRCA-related cancer risk assessment.** / For women who have family members with a BRCA-related cancer (breast, ovarian, tubal, or peritoneal cancers).  Mammogram.** / Every year beginning at age 43 years and continuing for as long as you are in good health. Consult with your health care provider.  Pap test.** / Every 3 years starting at age 16 years through age 55 or 31 years with a history of 3 consecutive normal Pap tests.  HPV screening.** / Every 3 years from ages 35 years through ages 74 to 44 years with a history of 3 consecutive normal Pap tests.  Fecal occult blood test (FOBT) of stool. / Every year  beginning at age 70 years and continuing until age 46 years. You may not need to do this test if you get a colonoscopy every 10 years.  Flexible sigmoidoscopy or colonoscopy.** / Every 5 years for a flexible sigmoidoscopy or every 10 years for a colonoscopy beginning at age 54 years and continuing until age 26 years.  Hepatitis C blood test.** / For all people born from [birth year] through 1964 and any individual with known risks for hepatitis C.  Skin self-exam. / Monthly.  Influenza vaccine. / Every year.  Tetanus, diphtheria, and acellular pertussis (Tdap/Td) vaccine.** / Consult your health care provider. Pregnant women should receive 1 dose of Tdap vaccine during each pregnancy. 1 dose of Td every 10 years.  Varicella vaccine.** / Consult your health care provider. Pregnant females who do not have evidence of immunity should receive the first dose after pregnancy.  Zoster  vaccine.** / 1 dose for adults aged 108 years or older.  Measles, mumps, rubella (MMR) vaccine.** / You need at least 1 dose of MMR if you were born in 1957 or later. You may also need a 2nd dose. For females of childbearing age, rubella immunity should be determined. If there is no evidence of immunity, females who are not pregnant should be vaccinated. If there is no evidence of immunity, females who are pregnant should delay immunization until after pregnancy.  Pneumococcal 13-valent conjugate (PCV13) vaccine.** / Consult your health care provider.  Pneumococcal polysaccharide (PPSV23) vaccine.** / 1 to 2 doses if you smoke cigarettes or if you have certain conditions.  Meningococcal vaccine.** / Consult your health care provider.  Hepatitis A vaccine.** / Consult your health care provider.  Hepatitis B vaccine.** / Consult your health care provider.  Haemophilus influenzae type b (Hib) vaccine.** / Consult your health care provider. Ages 81 years and over  Blood pressure check.** / Every 1 to 2 years.  Lipid and  cholesterol check.** / Every 5 years beginning at age 28 years.  Lung cancer screening. / Every year if you are aged 43-80 years and have a 30-pack-year history of smoking and currently smoke or have quit within the past 15 years. Yearly screening is stopped once you have quit smoking for at least 15 years or develop a health problem that would prevent you from having lung cancer treatment.  Clinical breast exam.** / Every year after age 61 years.  BRCA-related cancer risk assessment.** / For women who have family members with a BRCA-related cancer (breast, ovarian, tubal, or peritoneal cancers).  Mammogram.** / Every year beginning at age 63 years and continuing for as long as you are in good health. Consult with your health care provider.  Pap test.** / Every 3 years starting at age 62 years through age 40 or 26 years with 3 consecutive normal Pap tests. Testing can be stopped between 65 and 70 years with 3 consecutive normal Pap tests and no abnormal Pap or HPV tests in the past 10 years.  HPV screening.** / Every 3 years from ages 52 years through ages 59 or 27 years with a history of 3 consecutive normal Pap tests. Testing can be stopped between 65 and 70 years with 3 consecutive normal Pap tests and no abnormal Pap or HPV tests in the past 10 years.  Fecal occult blood test (FOBT) of stool. / Every year beginning at age 5 years and continuing until age 65 years. You may not need to do this test if you get a colonoscopy every 10 years.  Flexible sigmoidoscopy or colonoscopy.** / Every 5 years for a flexible sigmoidoscopy or every 10 years for a colonoscopy beginning at age 8 years and continuing until age 83 years.  Hepatitis C blood test.** / For all people born from [birth year] through 1965 and any individual with known risks for hepatitis C.  Osteoporosis screening.** / A one-time screening for women ages 56 years and over and women at risk for fractures or osteoporosis.  Skin self-exam. /  Monthly.  Influenza vaccine. / Every year.  Tetanus, diphtheria, and acellular pertussis (Tdap/Td) vaccine.** / 1 dose of Td every 10 years.  Varicella vaccine.** / Consult your health care provider.  Zoster vaccine.** / 1 dose for adults aged 62 years or older.  Pneumococcal 13-valent conjugate (PCV13) vaccine.** / Consult your health care provider.  Pneumococcal polysaccharide (PPSV23) vaccine.** / 1 dose for all adults aged 41 years  and older.  Meningococcal vaccine.** / Consult your health care provider.  Hepatitis A vaccine.** / Consult your health care provider.  Hepatitis B vaccine.** / Consult your health care provider.  Haemophilus influenzae type b (Hib) vaccine.** / Consult your health care provider. ** Family history and personal history of risk and conditions may change your health care provider's recommendations. Document Released: 07/04/2001 Document Revised: 09/22/2013 Document Reviewed: 10/03/2010 [facility] Patient Information 2015 [place], [place]. This information is not intended to replace advice given to you by your health care provider. Make sure you discuss any questions you have with your health care provider.

## 2014-09-08 NOTE — Progress Notes (Signed)
Subjective:   Brittany Clarke is a 74 y.o. female who presents for an Initial Medicare Annual Wellness Visit. She is accompanied by her nursing aid who stays with her during the day.  Her children take turns staying with her in the evenings and overnight. She has had 24 hour care since having a stroke in 2013.  She goes to physical therapy twice per week.  She had a PT appointment earlier today, and complains of some right leg pain that she associates with increased movement at PT.  She uses a wheelchair for mobility around her home and in and out of appointments and church.  She continues to have right sided deficits and weakness of upper and lower extremities.  Ms. Newsome is scheduled to begin occupational therapy next Friday.      Cardiac Risk Factors include: diabetes mellitus;hypertension;sedentary lifestyle;obesity (BMI >30kg/m2);dyslipidemia;advanced age (>54men, >40 women)     Objective:    Today's Vitals   09/08/14 1435  BP: 123/77  Pulse: 90  Height: 5\' 4"  (1.626 m)  Weight: 225 lb (102.059 kg)  PainSc: 10-Worst pain ever  PainLoc: Leg    Current Medications (verified) Outpatient Encounter Prescriptions as of 09/08/2014  Medication Sig  . acetaminophen (TYLENOL) 500 MG tablet Take 500 mg by mouth every 6 (six) hours as needed. For pain  . allopurinol (ZYLOPRIM) 300 MG tablet TAKE ONE (1) TABLET EACH DAY  . atorvastatin (LIPITOR) 40 MG tablet TAKE ONE (1) TABLET EACH DAY  . baclofen (LIORESAL) 10 MG tablet TAKE 1/2 TABLET 3 TIMES DAILY  . clotrimazole-betamethasone (LOTRISONE) cream Apply 1 application topically 2 (two) times daily.  . feeding supplement (GLUCERNA SHAKE) LIQD Take 237 mLs by mouth 3 (three) times daily between meals.  . furosemide (LASIX) 20 MG tablet Take 20 mg by mouth daily as needed for fluid (takes every other day).   Marland Kitchen gabapentin (NEURONTIN) 100 MG capsule Take 100 mg by mouth 2 (two) times daily.  Marland Kitchen gabapentin (NEURONTIN) 400 MG capsule TAKE 1  CAPSULE DAILY EVERY EVENING  . HYDROcodone-acetaminophen (NORCO) 7.5-325 MG per tablet Take 1 tablet by mouth 2 (two) times daily as needed for moderate pain.  Marland Kitchen loratadine (CLARITIN) 10 MG tablet TAKE ONE (1) TABLET EACH DAY  . losartan (COZAAR) 25 MG tablet TAKE ONE (1) TABLET EACH DAY  . metFORMIN (GLUCOPHAGE) 500 MG tablet TAKE ONE TABLET TWICE A DAY WITH FOOD  . metoprolol tartrate (LOPRESSOR) 25 MG tablet TAKE 1/2 TABLET TWICE DAILY  . montelukast (SINGULAIR) 10 MG tablet TAKE 1 TABLET DAILY IN THE EVENING  . polyethylene glycol (MIRALAX / GLYCOLAX) packet Take 17 g by mouth daily.  Marland Kitchen warfarin (COUMADIN) 5 MG tablet TAKE ONE (1) TABLET EACH DAY (Patient taking differently: take 1 tablet on Sunday, Tuesday, Thursday, Saturday, and 1/2 tablet on Monday, Wednesday, and Friday)  . Misc. Devices (O-RING CUSHION) MISC 1 each by Does not apply route as needed (to relieve pressure on sacrum). (Patient not taking: Reported on 09/08/2014)  . neomycin-polymyxin-hydrocortisone (CORTISPORIN) 3.5-10000-1 otic suspension Place 3 drops into the left ear daily as needed (Ear Pain).    Allergies (verified) Ultram   History: Past Medical History  Diagnosis Date  . CHF (congestive heart failure)   . Diabetes mellitus without complication   . COPD (chronic obstructive pulmonary disease)   . Hypertension   . Asthma   . Shortness of breath   . Stroke     20 13  . Anxiety   . Pneumonia   .  Gout   . 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 Anticoagulant long-term use    Past Surgical History  Procedure Laterality Date  . Tubal ligation    . Colonoscopy  04/04/2002    [id number] hemorrhoids, otherwise, normal rectum/Scattered left sided diverticula/Somewhat adenomatous appearing ileocecal valve which is probably normal but this area was biopsied. The remainder of the colonic mucosa appeared   normal  . Trigger finger surgery      twice  . Colonoscopy with  esophagogastroduodenoscopy (egd) N/A 02/20/2013    Procedure: COLONOSCOPY WITH ESOPHAGOGASTRODUODENOSCOPY (EGD);  Surgeon: Daneil Dolin, MD;  [facility];  Service: Endoscopy;  Laterality: N/A;  . Breast surgery Left 2005    lumpectomy and removed lymph nodes   Family History  Problem Relation Age of Onset  . Colon cancer Neg Hx   . Liver disease Neg Hx   . Heart disease Mother   . Cancer Father     lung  . Cancer Brother     prostate  . Diabetes Sister   . Cancer Sister     breast cancer  . Hypertension Sister   . Diabetes Sister   . Hypertension Sister   . Diabetes Sister   . Hypertension Sister   . Hypertension Sister   . Cancer Brother      Tobacco Counseling Counseling given: Not Answered   Activities of Daily Living In your present state of health, do you have any difficulty performing the following activities: 09/08/2014  Hearing? N  Vision? N  Difficulty concentrating or making decisions? N  Walking or climbing stairs? Y  Dressing or bathing? Y  Doing errands, shopping? Y  Preparing Food and eating ? Y  Using the Toilet? Y  In the past six months, have you accidently leaked urine? Y  Do you have problems with loss of bowel control? N  Managing your Medications? Y  Managing your Finances? Y  Housekeeping or managing your Housekeeping? Y    Immunizations and Health Maintenance Immunization History  Administered Date(s) Administered  . Influenza,inj,Quad PF,36+ Mos 04/16/2013, 03/12/2014  . Pneumococcal Conjugate-13 12/05/2013  . Tdap 12/05/2013   Health Maintenance Due  Topic Date Due  . OPHTHALMOLOGY EXAM  12/26/1950  . URINE MICROALBUMIN  12/26/1950  . ZOSTAVAX  12/25/2001    Patient Care Team: [name], MD as PCP - General (Family Medicine)       Assessment:   This is a routine wellness examination for [facility].   Hearing/Vision screen Patient is due for eye exam- Called [facility] center in [place], [place] per  patient's request and scheduled her for an appointment with Dr. Rona Ravens on May 18th, 2016 at 2pm.  Requested they send these records after appointment.  No hearing deficit noted  Dietary issues and exercise activities discussed: Current Exercise Habits:: Exercise is limited by, Limited by:: Other - see comments (no exercise since CVA in 2013, goes to physicla therapy twice weekly)  Goals    None    Continue with physical therapy as scheduled, and start occupational therapy next Friday.   Depression Screen PHQ 2/9 Scores 09/08/2014 03/12/2014 07/29/2013  PHQ - 2 Score 0 0 0    Fall Risk Fall Risk  09/08/2014 03/12/2014 07/29/2013  Falls in the past year? No No No  Risk for fall due to : Impaired balance/gait;Impaired mobility - -    Cognitive Function: MMSE - Mini Mental State Exam 09/08/2014  Not completed: Refused  Not refused- completed MMS- total 24  Screening Tests Health Maintenance  Topic Date Due  . OPHTHALMOLOGY EXAM  12/26/1950  . URINE MICROALBUMIN  12/26/1950  . ZOSTAVAX  12/25/2001  . PNA vac Low Risk Adult (2 of 2 - PPSV23) 12/06/2014  . HEMOGLOBIN A1C  12/16/2014  . INFLUENZA VACCINE  12/21/2014  . FOOT EXAM  04/24/2015  . MAMMOGRAM  06/03/2016  . COLONOSCOPY  02/21/2023  . TETANUS/TDAP  12/06/2023  . DEXA SCAN  Completed   Scheduled for eye exam- 10/07/14 at 2pm with Dr. Rona Ravens at [facility] in [place], [place] Zostavax given today Patient has follow up with Dr. Sabra Heck 09/22/14- should have remaining health maintenance completed at that time       Plan:     Continue physical therapy and start occupational therapy next week Attend eye appointment on 10/07/14 at 2pm.     During the course of the visit, Shiane was educated and counseled about the following appropriate screening and preventive services:   Vaccines to include Pneumoccal, Influenza, Td, Zostavax- up to date- needs pneumovax after 12/04/14  Colorectal cancer screening- up to date  Bone density  screening- up to date  Diabetes screening- up to date  Glaucoma screening- scheduled  Mammography/PAP- up to date   Patient Instructions (the written plan) were given to the patient.    WYATT, AMY M, RN   09/08/2014      I have reviewed and agree with the above AWV documentation.  Claretta Fraise, M.D.

## 2014-09-09 ENCOUNTER — Ambulatory Visit: Payer: Medicare Other | Admitting: *Deleted

## 2014-09-10 ENCOUNTER — Encounter: Payer: Self-pay | Admitting: *Deleted

## 2014-09-10 ENCOUNTER — Ambulatory Visit: Payer: Medicare Other | Admitting: *Deleted

## 2014-09-10 DIAGNOSIS — R262 Difficulty in walking, not elsewhere classified: Secondary | ICD-10-CM | POA: Diagnosis not present

## 2014-09-10 DIAGNOSIS — G8191 Hemiplegia, unspecified affecting right dominant side: Secondary | ICD-10-CM

## 2014-09-10 DIAGNOSIS — R29898 Other symptoms and signs involving the musculoskeletal system: Secondary | ICD-10-CM | POA: Diagnosis not present

## 2014-09-10 DIAGNOSIS — M6281 Muscle weakness (generalized): Secondary | ICD-10-CM | POA: Diagnosis not present

## 2014-09-10 DIAGNOSIS — G8190 Hemiplegia, unspecified affecting unspecified side: Secondary | ICD-10-CM | POA: Diagnosis not present

## 2014-09-10 NOTE — Therapy (Signed)
[facility] [place], [place], [address] Phone: [phone]   Fax:  [phone]  Physical Therapy Treatment  Patient Details  Name: Brittany Clarke MRN: [id number] Date of Birth: [birth year] Referring Provider:  [name], MD  Encounter Date: 09/10/2014      PT End of Session - 09/10/14 1534    Visit Number 13   Number of Visits 24   PT Start Time 1346   PT Stop Time 1433   PT Time Calculation (min) 47 min      Past Medical History  Diagnosis Date  . CHF (congestive heart failure)   . Diabetes mellitus without complication   . COPD (chronic obstructive pulmonary disease)   . Hypertension   . Asthma   . Shortness of breath   . Stroke     2013  . Anxiety   . Pneumonia   . Gout   . 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 Anticoagulant long-term use     Past Surgical History  Procedure Laterality Date  . Tubal ligation    . Colonoscopy  04/04/2002    [id number] hemorrhoids, otherwise, normal rectum/Scattered left sided diverticula/Somewhat adenomatous appearing ileocecal valve which is probably normal but this area was biopsied. The remainder of the colonic mucosa appeared   normal  . Trigger finger surgery      twice  . Colonoscopy with esophagogastroduodenoscopy (egd) N/A 02/20/2013    Procedure: COLONOSCOPY WITH ESOPHAGOGASTRODUODENOSCOPY (EGD);  Surgeon: Daneil Dolin, MD;  [facility];  Service: Endoscopy;  Laterality: N/A;  . Breast surgery Left 2005    lumpectomy and removed lymph nodes    There were no vitals filed for this visit.  Visit Diagnosis:  Difficulty walking  Muscle weakness (generalized)  Right hemiplegia  Right leg weakness      Subjective Assessment - 09/10/14 1407    Subjective  Will also start OT for my right hand.GET AFO soon   Limitations Walking   Patient Stated Goals Walk and move better.   Currently in Pain? Yes   Pain Score 8     Pain Location Leg   Pain Orientation Right   Pain Descriptors / Indicators Aching   Aggravating Factors  cold   Pain Relieving Factors rest, meds                         [facility] Adult PT Treatment/Exercise - 09/10/14 0001    Transfers   Transfers Stand to Sit   Sit to Stand 3: Mod assist   Ambulation/Gait   Ambulation/Gait Yes   Ambulation/Gait Assistance --  CGA   Ambulation Distance (Feet) 200 Feet   x2 and then 75 feet   Assistive device Small based quad cane   Gait Pattern Step-to pattern   Ambulation Surface Level  tile, carpet rubber flooring   Exercises   Exercises Knee/Hip;Ankle   Ankle Exercises: Seated   Other Seated Ankle Exercises Rockerboard PF/DF                   PT Short Term Goals - 07/23/14 1402    PT SHORT TERM GOAL #1   Title STG's=LTG's.           PT Long Term Goals - 09/03/14 1338    PT LONG TERM GOAL #2   Title Walk 200 feet with a quad cane with CGA.   Status Achieved  PT LONG TERM GOAL #3   Title Improve strength by 1/2 to 1 full muscle grade.   Time 8   Period Weeks   Status On-going               Plan - 09/10/14 1535    Clinical Impression Statement Pt did great today and was able to ambulate 27f x2 and then another 75 ft. Her endurance cont.'s to improve, but strength is about the same. No new goals met today. [facility] and will [facility]   Pt will benefit from skilled therapeutic intervention in order to improve on the following deficits Abnormal gait;Decreased strength;Difficulty walking   Rehab Potential Good   PT Frequency 3x / week   PT Duration 8 weeks   PT Next Visit Plan [facility]   Consulted and Agree with Plan of Care Patient        Problem List Patient Active Problem List   Diagnosis Date Noted  . Unspecified vitamin D deficiency 07/30/2013  . Right hemiplegia 06/25/2013  . Muscle weakness (generalized) 06/25/2013  .  Need for prophylactic vaccination and inoculation against influenza 04/16/2013  . 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 Esophageal dysphagia 02/11/2013  . Long term (current) use of anticoagulants 01/24/2013  . Rectal bleeding 01/24/2013  . Chronic constipation 12/02/2012  . Pedal edema 12/02/2012  . Otalgia of left ear 12/02/2012  . History of rectal bleeding 12/02/2012  . HLD (hyperlipidemia) 12/02/2012  . Dysuria 12/02/2012  . Pain in joint, ankle and foot 09/10/2012  . PNA (pneumonia) 06/10/2012  . Hypokalemia 06/10/2012  . CHF (congestive heart failure) 06/10/2012  . Morbid obesity 06/10/2012  . History of CVA (cerebrovascular accident) 06/10/2012  . Anemia 06/10/2012  . DDD (degenerative disc disease), lumbar 06/10/2012  . Allergic rhinitis 06/10/2012  . Gout 06/10/2012  . HTN (hypertension) 06/10/2012  . DM (diabetes mellitus) 06/10/2012  . Constipation 06/10/2012  . Urinary incontinence 06/10/2012  . History of breast cancer 06/10/2012  . COPD (chronic obstructive pulmonary disease) 06/10/2012    RAMSEUR,CHRIS, PTA 09/10/2014, 3:53 PM  [facility] [address]Phone: [phone]  Fax:  [phone]

## 2014-09-12 DIAGNOSIS — J449 Chronic obstructive pulmonary disease, unspecified: Secondary | ICD-10-CM | POA: Diagnosis not present

## 2014-09-12 DIAGNOSIS — I519 Heart disease, unspecified: Secondary | ICD-10-CM | POA: Diagnosis not present

## 2014-09-12 DIAGNOSIS — R0902 Hypoxemia: Secondary | ICD-10-CM | POA: Diagnosis not present

## 2014-09-14 ENCOUNTER — Other Ambulatory Visit: Payer: Self-pay | Admitting: *Deleted

## 2014-09-14 DIAGNOSIS — E11610 Type 2 diabetes mellitus with diabetic neuropathic arthropathy: Secondary | ICD-10-CM

## 2014-09-14 NOTE — Telephone Encounter (Signed)
Last filled 08/15/14, last seen 06/17/14. Rx will print

## 2014-09-15 ENCOUNTER — Encounter: Payer: Self-pay | Admitting: *Deleted

## 2014-09-15 ENCOUNTER — Ambulatory Visit: Payer: Medicare Other | Admitting: *Deleted

## 2014-09-15 DIAGNOSIS — R29898 Other symptoms and signs involving the musculoskeletal system: Secondary | ICD-10-CM | POA: Diagnosis not present

## 2014-09-15 DIAGNOSIS — G8191 Hemiplegia, unspecified affecting right dominant side: Secondary | ICD-10-CM

## 2014-09-15 DIAGNOSIS — R262 Difficulty in walking, not elsewhere classified: Secondary | ICD-10-CM

## 2014-09-15 DIAGNOSIS — M6281 Muscle weakness (generalized): Secondary | ICD-10-CM

## 2014-09-15 DIAGNOSIS — G8190 Hemiplegia, unspecified affecting unspecified side: Secondary | ICD-10-CM | POA: Diagnosis not present

## 2014-09-15 MED ORDER — HYDROCODONE-ACETAMINOPHEN 7.5-325 MG PO TABS: 1.0000 | ORAL_TABLET | Freq: Two times a day (BID) | ORAL | Status: DC | PRN

## 2014-09-15 NOTE — Therapy (Signed)
[facility] [place], [place], [address] Phone: [phone]   Fax:  [phone]  Physical Therapy Treatment  Patient Details  Name: Brittany Clarke MRN: [id number] Date of Birth: 02-20-1941 Referring Provider:  [name], MD  Encounter Date: 09/15/2014      PT End of Session - 09/15/14 1330    Visit Number 14   Number of Visits 24   PT Start Time 1300   PT Stop Time 4174   PT Time Calculation (min) 48 min      Past Medical History  Diagnosis Date  . CHF (congestive heart failure)   . Diabetes mellitus without complication   . COPD (chronic obstructive pulmonary disease)   . Hypertension   . Asthma   . Shortness of breath   . Stroke     2013  . Anxiety   . Pneumonia   . Gout   . 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 Anticoagulant long-term use     Past Surgical History  Procedure Laterality Date  . Tubal ligation    . Colonoscopy  04/04/2002    [id number] hemorrhoids, otherwise, normal rectum/Scattered left sided diverticula/Somewhat adenomatous appearing ileocecal valve which is probably normal but this area was biopsied. The remainder of the colonic mucosa appeared   normal  . Trigger finger surgery      twice  . Colonoscopy with esophagogastroduodenoscopy (egd) N/A 02/20/2013    Procedure: COLONOSCOPY WITH ESOPHAGOGASTRODUODENOSCOPY (EGD);  Surgeon: Daneil Dolin, MD;  [facility];  Service: Endoscopy;  Laterality: N/A;  . Breast surgery Left 2005    lumpectomy and removed lymph nodes    There were no vitals filed for this visit.  Visit Diagnosis:  Difficulty walking  Muscle weakness (generalized)  Right hemiplegia  Right leg weakness      Subjective Assessment - 09/15/14 1331    Subjective  Will also start OT for my right hand.GET AFO soon. Still walking 4x at home.  Five x is too much   Limitations Walking   Patient Stated Goals Walk and move  better.   Currently in Pain? Yes   Pain Score 8    Pain Location Leg   Pain Orientation Right   Pain Descriptors / Indicators Aching   Aggravating Factors  cold   Pain Relieving Factors rest,meds                         [facility] Adult PT Treatment/Exercise - 09/15/14 0001    Transfers   Transfers Stand to Sit   Sit to Stand 3: Mod assist   Ambulation/Gait   Ambulation/Gait Yes   Ambulation Distance (Feet) 200 Feet  200 ft x2, then 2x25 ft all with CGA   Assistive device Small based quad cane   Gait Pattern Step-to pattern   Ambulation Surface Level  carpet,rubber flooring, tile   Ankle Exercises: Seated   Other Seated Ankle Exercises Dynadisc RT foot all motions   Other Seated Ankle Exercises Ankle isolator 1/2 # all motions RT ankle                  PT Short Term Goals - 07/23/14 1402    PT SHORT TERM GOAL #1   Title STG's=LTG's.           PT Long Term Goals - 09/03/14 1338    PT LONG TERM GOAL #2  Title Walk 200 feet with a quad cane with CGA.   Status Achieved   PT LONG TERM GOAL #3   Title Improve strength by 1/2 to 1 full muscle grade.   Time 8   Period Weeks   Status On-going               Plan - 09/15/14 1345    Clinical Impression Statement Pt did better today with ambulation and wasn't as fatigued. She did fair with ankle exs. It is still challenging for her to control RT foot motions. Goals are on-going   Pt will benefit from skilled therapeutic intervention in order to improve on the following deficits Abnormal gait;Decreased strength;Difficulty walking   Rehab Potential Good   PT Frequency 3x / week   PT Duration 8 weeks   PT Next Visit Plan Pt to get an AFO from [place] soon. Continue with Gait and Ankle rehab        Problem List Patient Active Problem List   Diagnosis Date Noted  . Unspecified vitamin D deficiency 07/30/2013  . Right hemiplegia 06/25/2013  . Muscle weakness (generalized) 06/25/2013  .  Need for prophylactic vaccination and inoculation against influenza 04/16/2013  . 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 Esophageal dysphagia 02/11/2013  . Long term (current) use of anticoagulants 01/24/2013  . Rectal bleeding 01/24/2013  . Chronic constipation 12/02/2012  . Pedal edema 12/02/2012  . Otalgia of left ear 12/02/2012  . History of rectal bleeding 12/02/2012  . HLD (hyperlipidemia) 12/02/2012  . Dysuria 12/02/2012  . Pain in joint, ankle and foot 09/10/2012  . PNA (pneumonia) 06/10/2012  . Hypokalemia 06/10/2012  . CHF (congestive heart failure) 06/10/2012  . Morbid obesity 06/10/2012  . History of CVA (cerebrovascular accident) 06/10/2012  . Anemia 06/10/2012  . DDD (degenerative disc disease), lumbar 06/10/2012  . Allergic rhinitis 06/10/2012  . Gout 06/10/2012  . HTN (hypertension) 06/10/2012  . DM (diabetes mellitus) 06/10/2012  . Constipation 06/10/2012  . Urinary incontinence 06/10/2012  . History of breast cancer 06/10/2012  . COPD (chronic obstructive pulmonary disease) 06/10/2012    Hala Narula,CHRIS 09/15/2014, 2:03 PM  [facility] [place], [place], [address] Phone: [phone]   Fax:  [phone]

## 2014-09-17 ENCOUNTER — Ambulatory Visit: Payer: Medicare Other | Admitting: Physical Therapy

## 2014-09-17 DIAGNOSIS — R262 Difficulty in walking, not elsewhere classified: Secondary | ICD-10-CM | POA: Diagnosis not present

## 2014-09-17 DIAGNOSIS — M6281 Muscle weakness (generalized): Secondary | ICD-10-CM

## 2014-09-17 DIAGNOSIS — R29898 Other symptoms and signs involving the musculoskeletal system: Secondary | ICD-10-CM

## 2014-09-17 DIAGNOSIS — G8190 Hemiplegia, unspecified affecting unspecified side: Secondary | ICD-10-CM | POA: Diagnosis not present

## 2014-09-17 DIAGNOSIS — G8191 Hemiplegia, unspecified affecting right dominant side: Secondary | ICD-10-CM

## 2014-09-17 NOTE — Therapy (Signed)
[facility] [place], [place], [address] Phone: [phone]   Fax:  [phone]  Physical Therapy Treatment  Patient Details  Name: Brittany Clarke MRN: [id number] Date of Birth: [birth year] Referring Provider:  [name], MD  Encounter Date: 09/17/2014      PT End of Session - 09/17/14 1338    Visit Number 15   Number of Visits 24   Date for PT Re-Evaluation 09/24/14   PT Start Time 1301   PT Stop Time 1350   PT Time Calculation (min) 49 min      Past Medical History  Diagnosis Date  . CHF (congestive heart failure)   . Diabetes mellitus without complication   . COPD (chronic obstructive pulmonary disease)   . Hypertension   . Asthma   . Shortness of breath   . Stroke     2013  . Anxiety   . Pneumonia   . Gout   . 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 Anticoagulant long-term use     Past Surgical History  Procedure Laterality Date  . Tubal ligation    . Colonoscopy  04/04/2002    [id number] hemorrhoids, otherwise, normal rectum/Scattered left sided diverticula/Somewhat adenomatous appearing ileocecal valve which is probably normal but this area was biopsied. The remainder of the colonic mucosa appeared   normal  . Trigger finger surgery      twice  . Colonoscopy with esophagogastroduodenoscopy (egd) N/A 02/20/2013    Procedure: COLONOSCOPY WITH ESOPHAGOGASTRODUODENOSCOPY (EGD);  Surgeon: Daneil Dolin, MD;  [facility];  Service: Endoscopy;  Laterality: N/A;  . Breast surgery Left 2005    lumpectomy and removed lymph nodes    There were no vitals filed for this visit.  Visit Diagnosis:  Difficulty walking  Muscle weakness (generalized)  Right hemiplegia  Right leg weakness      Subjective Assessment - 09/17/14 1336    Subjective Patient pleased with how she has done overall.                                   PT Short  Term Goals - 07/23/14 1402    PT SHORT TERM GOAL #1   Title STG's=LTG's.           PT Long Term Goals - 09/03/14 1338    PT LONG TERM GOAL #2   Title Walk 200 feet with a quad cane with CGA.   Status Achieved   PT LONG TERM GOAL #3   Title Improve strength by 1/2 to 1 full muscle grade.   Time 8   Period Weeks   Status On-going               Problem List Patient Active Problem List   Diagnosis Date Noted  . Unspecified vitamin D deficiency 07/30/2013  . Right hemiplegia 06/25/2013  . Muscle weakness (generalized) 06/25/2013  . Need for prophylactic vaccination and inoculation against influenza 04/16/2013  . 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 Esophageal dysphagia 02/11/2013  . Long term (current) use of anticoagulants 01/24/2013  . Rectal bleeding 01/24/2013  . Chronic constipation 12/02/2012  . Pedal edema 12/02/2012  . Otalgia of left ear 12/02/2012  . History of rectal bleeding 12/02/2012  . HLD (hyperlipidemia) 12/02/2012  . Dysuria 12/02/2012  . Pain in joint, ankle and foot 09/10/2012  .  PNA (pneumonia) 06/10/2012  . Hypokalemia 06/10/2012  . CHF (congestive heart failure) 06/10/2012  . Morbid obesity 06/10/2012  . History of CVA (cerebrovascular accident) 06/10/2012  . Anemia 06/10/2012  . DDD (degenerative disc disease), lumbar 06/10/2012  . Allergic rhinitis 06/10/2012  . Gout 06/10/2012  . HTN (hypertension) 06/10/2012  . DM (diabetes mellitus) 06/10/2012  . Constipation 06/10/2012  . Urinary incontinence 06/10/2012  . History of breast cancer 06/10/2012  . COPD (chronic obstructive pulmonary disease) 06/10/2012   TREATMENT:  GT 100 feet with QC x 4.  Jaelen Soth, [place] MPT 09/17/2014, 2:25 PM  [facility] [address] [place], [place], [address] Phone: [phone]   Fax:  [phone]

## 2014-09-18 ENCOUNTER — Other Ambulatory Visit: Payer: Self-pay | Admitting: Family Medicine

## 2014-09-18 DIAGNOSIS — I69359 Hemiplegia and hemiparesis following cerebral infarction affecting unspecified side: Secondary | ICD-10-CM | POA: Diagnosis not present

## 2014-09-18 DIAGNOSIS — M6289 Other specified disorders of muscle: Secondary | ICD-10-CM | POA: Diagnosis not present

## 2014-09-18 DIAGNOSIS — Z5189 Encounter for other specified aftercare: Secondary | ICD-10-CM | POA: Diagnosis not present

## 2014-09-18 DIAGNOSIS — R5381 Other malaise: Secondary | ICD-10-CM | POA: Diagnosis not present

## 2014-09-18 DIAGNOSIS — R29898 Other symptoms and signs involving the musculoskeletal system: Secondary | ICD-10-CM | POA: Diagnosis not present

## 2014-09-21 ENCOUNTER — Encounter: Payer: Medicare Other | Admitting: Physical Therapy

## 2014-09-22 ENCOUNTER — Encounter: Payer: Self-pay | Admitting: *Deleted

## 2014-09-22 ENCOUNTER — Ambulatory Visit (INDEPENDENT_AMBULATORY_CARE_PROVIDER_SITE_OTHER): Payer: Medicare Other | Admitting: Family Medicine

## 2014-09-22 ENCOUNTER — Ambulatory Visit: Payer: Medicare Other | Attending: Family Medicine | Admitting: *Deleted

## 2014-09-22 ENCOUNTER — Encounter: Payer: Self-pay | Admitting: Family Medicine

## 2014-09-22 VITALS — BP 133/82 | HR 81 | Temp 99.7°F | Ht 64.0 in | Wt 225.0 lb

## 2014-09-22 DIAGNOSIS — G8190 Hemiplegia, unspecified affecting unspecified side: Secondary | ICD-10-CM | POA: Diagnosis not present

## 2014-09-22 DIAGNOSIS — R29898 Other symptoms and signs involving the musculoskeletal system: Secondary | ICD-10-CM | POA: Diagnosis not present

## 2014-09-22 DIAGNOSIS — R262 Difficulty in walking, not elsewhere classified: Secondary | ICD-10-CM

## 2014-09-22 DIAGNOSIS — K5900 Constipation, unspecified: Secondary | ICD-10-CM

## 2014-09-22 DIAGNOSIS — M6281 Muscle weakness (generalized): Secondary | ICD-10-CM

## 2014-09-22 DIAGNOSIS — I10 Essential (primary) hypertension: Secondary | ICD-10-CM

## 2014-09-22 DIAGNOSIS — Z7901 Long term (current) use of anticoagulants: Secondary | ICD-10-CM

## 2014-09-22 DIAGNOSIS — K5909 Other constipation: Secondary | ICD-10-CM

## 2014-09-22 DIAGNOSIS — G8191 Hemiplegia, unspecified affecting right dominant side: Secondary | ICD-10-CM

## 2014-09-22 DIAGNOSIS — E119 Type 2 diabetes mellitus without complications: Secondary | ICD-10-CM | POA: Diagnosis not present

## 2014-09-22 DIAGNOSIS — E11610 Type 2 diabetes mellitus with diabetic neuropathic arthropathy: Secondary | ICD-10-CM | POA: Diagnosis not present

## 2014-09-22 LAB — POCT INR: INR: 2.3

## 2014-09-22 MED ORDER — CLOTRIMAZOLE-BETAMETHASONE 1-0.05 % EX CREA: 1.0000 "application " | TOPICAL_CREAM | Freq: Two times a day (BID) | CUTANEOUS | Status: DC

## 2014-09-22 MED ORDER — BACLOFEN 10 MG PO TABS: ORAL_TABLET | ORAL | Status: DC

## 2014-09-22 MED ORDER — POLYETHYLENE GLYCOL 3350 17 G PO PACK: 17.0000 g | PACK | Freq: Every day | ORAL | Status: DC

## 2014-09-22 MED ORDER — NEOMYCIN-POLYMYXIN-HC 3.5-10000-1 OT SUSP: 3.0000 [drp] | Freq: Every day | OTIC | Status: DC | PRN

## 2014-09-22 NOTE — Therapy (Signed)
[facility] [place], [place], [address] Phone: [phone]   Fax:  [phone]  Physical Therapy Treatment  Patient Details  Name: Brittany Clarke MRN: [id number] Date of Birth: [birth year] Referring Provider:  [name], MD  Encounter Date: 09/22/2014      PT End of Session - 09/22/14 1716    Visit Number 16   Number of Visits 24   Date for PT Re-Evaluation 09/24/14   PT Start Time 6237   PT Stop Time 1516   PT Time Calculation (min) 43 min      Past Medical History  Diagnosis Date  . CHF (congestive heart failure)   . Diabetes mellitus without complication   . COPD (chronic obstructive pulmonary disease)   . Hypertension   . Asthma   . Shortness of breath   . Stroke     2013  . Anxiety   . Pneumonia   . Gout   . 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 Anticoagulant long-term use     Past Surgical History  Procedure Laterality Date  . Tubal ligation    . Colonoscopy  04/04/2002    [id number] hemorrhoids, otherwise, normal rectum/Scattered left sided diverticula/Somewhat adenomatous appearing ileocecal valve which is probably normal but this area was biopsied. The remainder of the colonic mucosa appeared   normal  . Trigger finger surgery      twice  . Colonoscopy with esophagogastroduodenoscopy (egd) N/A 02/20/2013    Procedure: COLONOSCOPY WITH ESOPHAGOGASTRODUODENOSCOPY (EGD);  Surgeon: Daneil Dolin, MD;  [facility];  Service: Endoscopy;  Laterality: N/A;  . Breast surgery Left 2005    lumpectomy and removed lymph nodes    There were no vitals filed for this visit.  Visit Diagnosis:  Difficulty walking  Muscle weakness (generalized)  Right hemiplegia  Right leg weakness      Subjective Assessment - 09/22/14 1438    Subjective Patient pleased with how she has done overall and has increased walking endurance and is able to walk farther   Patient  Stated Goals Walk and move better.   Currently in Pain? Yes   Pain Score 8    Pain Location Leg   Pain Orientation Right   Pain Descriptors / Indicators Aching   Aggravating Factors  cold   Pain Relieving Factors rest, meds            [facility] PT Assessment - 09/22/14 0001    ROM / Strength   AROM / PROM / Strength Strength   Strength   Overall Strength Comments Right hip flexion= 2/5; abd=3/5; right knee ext= 4/5  Flexion 3/5 and right ankle dorsiflexion to neutral and eversion= 2/5. Inversion 2/5                     [facility] Adult PT Treatment/Exercise - 09/22/14 0001    Transfers   Transfers Stand to Sit   Sit to Stand 3: Mod assist   Ambulation/Gait   Ambulation/Gait Yes   Ambulation Distance (Feet) 200 Feet   Assistive device Small based quad cane   Gait Pattern Step-to pattern   Ambulation Surface Level  on rubber mat,tile,carpet   Knee/Hip Exercises: Seated   Long Arc Quad Strengthening;3 sets;10 reps   Long Arc Quad Weight 4 lbs.   Long Arc Quad Limitations 3x10   Other Seated Knee Exercises Heel slides for HS AROM 2x 10 AAROM at times  Ankle Exercises: Seated   Towel Inversion/Eversion 5 reps  each way                  PT Short Term Goals - 07/23/14 1402    PT SHORT TERM GOAL #1   Title STG's=LTG's.           PT Long Term Goals - 10-03-2014 1455    PT LONG TERM GOAL #1   Title Independent with an HEP.   Time 8   Period Weeks   Status Achieved   PT LONG TERM GOAL #2   Title Walk 200 feet with a quad cane with CGA.   Status Achieved   PT LONG TERM GOAL #3   Time 8   Period Weeks   Status Not Met               Plan - 10/03/2014 1711    Clinical Impression Statement Pt did good today with Gait and progressed with endurance. She has met goal for ambulation, but not strength gaines. She is Independent with current HEP   Pt will benefit from skilled therapeutic intervention in order to improve on the following deficits  Abnormal gait;Decreased strength;Difficulty walking   Rehab Potential Good   PT Frequency 3x / week   PT Next Visit Plan Pt to get an AFO from [place] soon. D/C to HEP   Consulted and Agree with Plan of Care Patient          G-Codes - Oct 03, 2014 1720    Functional Assessment Tool Used Clinical judgement:     Functional Limitation Mobility: Walking and moving around   Mobility: Walking and Moving Around Current Status [phone]) At least 40 percent but less than 60 percent impaired, limited or restricted   Mobility: Walking and Moving Around Goal Status [phone]) At least 40 percent but less than 60 percent impaired, limited or restricted   Mobility: Walking and Moving Around Discharge Status [phone]) At least 40 percent but less than 60 percent impaired, limited or restricted      Problem List Patient Active Problem List   Diagnosis Date Noted  . Unspecified vitamin D deficiency 07/30/2013  . Right hemiplegia 06/25/2013  . Muscle weakness (generalized) 06/25/2013  . Need for prophylactic vaccination and inoculation against influenza 04/16/2013  . 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 Esophageal dysphagia 02/11/2013  . Long term current use of anticoagulant therapy 01/24/2013  . Rectal bleeding 01/24/2013  . Chronic constipation 12/02/2012  . Pedal edema 12/02/2012  . Otalgia of left ear 12/02/2012  . History of rectal bleeding 12/02/2012  . HLD (hyperlipidemia) 12/02/2012  . Dysuria 12/02/2012  . Pain in joint, ankle and foot 09/10/2012  . PNA (pneumonia) 06/10/2012  . Hypokalemia 06/10/2012  . CHF (congestive heart failure) 06/10/2012  . Morbid obesity 06/10/2012  . History of CVA (cerebrovascular accident) 06/10/2012  . Anemia 06/10/2012  . DDD (degenerative disc disease), lumbar 06/10/2012  . Allergic rhinitis 06/10/2012  . Gout 06/10/2012  . HTN (hypertension) 06/10/2012  . DM (diabetes mellitus) 06/10/2012  . Constipation 06/10/2012  . Urinary incontinence  06/10/2012  . History of breast cancer 06/10/2012  . COPD (chronic obstructive pulmonary disease) 06/10/2012    PHYSICAL THERAPY DISCHARGE SUMMARY  Visits from Start of Care: 18  Current functional level related to goals / functional outcomes: Please see above.   Remaining deficits: Remaining right LE strength deficit.   Education / Equipment:  Plan: Patient agrees to discharge.  Patient goals  were partially met. Patient is being discharged due to being pleased with the current functional level.  ?????      Akeria Hedstrom, [place] MPT 09/22/2014, 5:23 PM  [facility] [address] [place], [place], [address] Phone: [phone]   Fax:  [phone]

## 2014-09-22 NOTE — Progress Notes (Signed)
Subjective:    Patient ID: Brittany Clarke, female    DOB: Sep 23, 1940, 74 y.o.   MRN: [id number]  HPI 74 year old female here to follow-up old CVA, hypertension, hyperlipidemia, and diabetes. She has finished physical therapy but is now scheduled for occupational therapy in [place]. She feels that the physical therapy has improved her strength which was her original goal. She complains today of coolness in her side that was affected by the stroke, her right side. There is also pain in the upper and lower extremity on the right   Patient Active Problem List   Diagnosis Date Noted  . Unspecified vitamin D deficiency 07/30/2013  . Right hemiplegia 06/25/2013  . Muscle weakness (generalized) 06/25/2013  . Need for prophylactic vaccination and inoculation against influenza 04/16/2013  . 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 Esophageal dysphagia 02/11/2013  . Long term (current) use of anticoagulants 01/24/2013  . Rectal bleeding 01/24/2013  . Chronic constipation 12/02/2012  . Pedal edema 12/02/2012  . Otalgia of left ear 12/02/2012  . History of rectal bleeding 12/02/2012  . HLD (hyperlipidemia) 12/02/2012  . Dysuria 12/02/2012  . Pain in joint, ankle and foot 09/10/2012  . PNA (pneumonia) 06/10/2012  . Hypokalemia 06/10/2012  . CHF (congestive heart failure) 06/10/2012  . Morbid obesity 06/10/2012  . History of CVA (cerebrovascular accident) 06/10/2012  . Anemia 06/10/2012  . DDD (degenerative disc disease), lumbar 06/10/2012  . Allergic rhinitis 06/10/2012  . Gout 06/10/2012  . HTN (hypertension) 06/10/2012  . DM (diabetes mellitus) 06/10/2012  . Constipation 06/10/2012  . Urinary incontinence 06/10/2012  . History of breast cancer 06/10/2012  . COPD (chronic obstructive pulmonary disease) 06/10/2012   Outpatient Encounter Prescriptions as of 09/22/2014  Medication Sig  . acetaminophen (TYLENOL) 500 MG tablet Take 500 mg by mouth every 6 (six) hours as  needed. For pain  . allopurinol (ZYLOPRIM) 300 MG tablet TAKE ONE (1) TABLET EACH DAY  . atorvastatin (LIPITOR) 40 MG tablet TAKE ONE (1) TABLET EACH DAY  . baclofen (LIORESAL) 10 MG tablet TAKE 1/2 TABLET 3 TIMES DAILY  . clotrimazole-betamethasone (LOTRISONE) cream Apply 1 application topically 2 (two) times daily.  . feeding supplement (GLUCERNA SHAKE) LIQD Take 237 mLs by mouth 3 (three) times daily between meals.  . furosemide (LASIX) 20 MG tablet Take 20 mg by mouth daily as needed for fluid (takes every other day).   Marland Kitchen gabapentin (NEURONTIN) 100 MG capsule Take 100 mg by mouth 2 (two) times daily.  Marland Kitchen gabapentin (NEURONTIN) 400 MG capsule TAKE 1 CAPSULE DAILY EVERY EVENING  . HYDROcodone-acetaminophen (NORCO) 7.5-325 MG per tablet Take 1 tablet by mouth 2 (two) times daily as needed for moderate pain.  Marland Kitchen loratadine (CLARITIN) 10 MG tablet TAKE ONE (1) TABLET EACH DAY  . losartan (COZAAR) 25 MG tablet TAKE ONE (1) TABLET EACH DAY  . metFORMIN (GLUCOPHAGE) 500 MG tablet TAKE ONE TABLET TWICE A DAY WITH FOOD  . metoprolol tartrate (LOPRESSOR) 25 MG tablet TAKE 1/2 TABLET TWICE DAILY  . Misc. Devices (O-RING CUSHION) MISC 1 each by Does not apply route as needed (to relieve pressure on sacrum). (Patient not taking: Reported on 09/08/2014)  . montelukast (SINGULAIR) 10 MG tablet TAKE 1 TABLET DAILY IN THE EVENING  . neomycin-polymyxin-hydrocortisone (CORTISPORIN) 3.5-10000-1 otic suspension Place 3 drops into the left ear daily as needed (Ear Pain).  . polyethylene glycol (MIRALAX / GLYCOLAX) packet Take 17 g by mouth daily.  Marland Kitchen warfarin (COUMADIN) 5 MG tablet TAKE ONE (  1) TABLET EACH DAY   No facility-administered encounter medications on file as of 09/22/2014.     Review of Systems  HENT: Negative.   Respiratory: Negative.   Cardiovascular: Negative.   Neurological: Positive for weakness.  Psychiatric/Behavioral: Negative.        Objective:   Physical Exam  Constitutional: She is  oriented to person, place, and time. She appears well-developed and well-nourished.  Cardiovascular: Normal rate and regular rhythm.   Pulmonary/Chest: Effort normal and breath sounds normal.  Neurological: She is alert and oriented to person, place, and time.  Psychiatric: She has a normal mood and affect.          Assessment & Plan:  1. Essential hypertension Blood pressure well controlled on losartan and metoprolol. Continue same dose  2. Type 2 diabetes mellitus without complication Last K1Q 3 months ago was 6.3. Metformin controls her diabetes well  3. Long term current use of anticoagulant therapy PT/INR managed by pharmacy - POCT INR  4. Charcot foot due to diabetes mellitus No ulceration or skin breakdown  5. Chronic constipation Continue with MiraLAX reminded to drink plenty of water - polyethylene glycol (MIRALAX / GLYCOLAX) packet; Take 17 g by mouth daily.  Dispense: 14 each; Refill: 2 - TSH   [name] MD

## 2014-09-22 NOTE — Therapy (Signed)
[facility] [place], [place], [address] Phone: [phone]   Fax:  [phone]  Physical Therapy Treatment  Patient Details  Name: Brittany Clarke MRN: [id number] Date of Birth: 12/15/1940 Referring Provider:  [name], MD  Encounter Date: 09/22/2014      PT End of Session - 09/22/14 1716    Visit Number 16   Number of Visits 24   Date for PT Re-Evaluation 09/24/14   PT Start Time 3614   PT Stop Time 1516   PT Time Calculation (min) 43 min      Past Medical History  Diagnosis Date  . CHF (congestive heart failure)   . Diabetes mellitus without complication   . COPD (chronic obstructive pulmonary disease)   . Hypertension   . Asthma   . Shortness of breath   . Stroke     2013  . Anxiety   . Pneumonia   . Gout   . 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 Anticoagulant long-term use     Past Surgical History  Procedure Laterality Date  . Tubal ligation    . Colonoscopy  04/04/2002    ERX:[id number] hemorrhoids, otherwise, normal rectum/Scattered left sided diverticula/Somewhat adenomatous appearing ileocecal valve which is probably normal but this area was biopsied. The remainder of the colonic mucosa appeared   normal  . Trigger finger surgery      twice  . Colonoscopy with esophagogastroduodenoscopy (egd) N/A 02/20/2013    Procedure: COLONOSCOPY WITH ESOPHAGOGASTRODUODENOSCOPY (EGD);  Surgeon: Daneil Dolin, MD;  [facility]: [facility];  Service: Endoscopy;  Laterality: N/A;  . Breast surgery Left 2005    lumpectomy and removed lymph nodes    There were no vitals filed for this visit.  Visit Diagnosis:  Difficulty walking  Muscle weakness (generalized)  Right hemiplegia  Right leg weakness      Subjective Assessment - 09/22/14 1438    Subjective Patient pleased with how she has done overall and has increased walking endurance and is able to walk farther   Patient  Stated Goals Walk and move better.   Currently in Pain? Yes   Pain Score 8    Pain Location Leg   Pain Orientation Right   Pain Descriptors / Indicators Aching   Aggravating Factors  cold   Pain Relieving Factors rest, meds            [facility] PT Assessment - 09/22/14 0001    ROM / Strength   AROM / PROM / Strength Strength   Strength   Overall Strength Comments Right hip flexion= 2/5; abd=3/5; right knee ext= 4/5  Flexion 3/5 and right ankle dorsiflexion to neutral and eversion= 2/5. Inversion 2/5                     [facility] Adult PT Treatment/Exercise - 09/22/14 0001    Transfers   Transfers Stand to Sit   Sit to Stand 3: Mod assist   Ambulation/Gait   Ambulation/Gait Yes   Ambulation Distance (Feet) 200 Feet   Assistive device Small based quad cane   Gait Pattern Step-to pattern   Ambulation Surface Level  on rubber mat,tile,carpet   Knee/Hip Exercises: Seated   Long Arc Quad Strengthening;3 sets;10 reps   Long Arc Quad Weight 4 lbs.   Long Arc Quad Limitations 3x10   Other Seated Knee Exercises Heel slides for HS AROM 2x 10 AAROM at times  Ankle Exercises: Seated   Towel Inversion/Eversion 5 reps  each way                  PT Short Term Goals - 07/23/14 1402    PT SHORT TERM GOAL #1   Title STG's=LTG's.           PT Long Term Goals - 09/22/14 1455    PT LONG TERM GOAL #1   Title Independent with an HEP.   Time 8   Period Weeks   Status Achieved   PT LONG TERM GOAL #2   Title Walk 200 feet with a quad cane with CGA.   Status Achieved   PT LONG TERM GOAL #3   Time 8   Period Weeks   Status Not Met               Plan - 09/22/14 1711    Clinical Impression Statement Pt did good today with Gait and progressed with endurance. She has met goal for ambulation, but not strength gaines. She is Independent with current HEP   Pt will benefit from skilled therapeutic intervention in order to improve on the following deficits  Abnormal gait;Decreased strength;Difficulty walking   Rehab Potential Good   PT Frequency 3x / week   PT Next Visit Plan Pt to get an AFO from [place] soon. D/C to HEP   Consulted and Agree with Plan of Care Patient        Problem List Patient Active Problem List   Diagnosis Date Noted  . Unspecified vitamin D deficiency 07/30/2013  . Right hemiplegia 06/25/2013  . Muscle weakness (generalized) 06/25/2013  . Need for prophylactic vaccination and inoculation against influenza 04/16/2013  . 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 Esophageal dysphagia 02/11/2013  . Long term current use of anticoagulant therapy 01/24/2013  . Rectal bleeding 01/24/2013  . Chronic constipation 12/02/2012  . Pedal edema 12/02/2012  . Otalgia of left ear 12/02/2012  . History of rectal bleeding 12/02/2012  . HLD (hyperlipidemia) 12/02/2012  . Dysuria 12/02/2012  . Pain in joint, ankle and foot 09/10/2012  . PNA (pneumonia) 06/10/2012  . Hypokalemia 06/10/2012  . CHF (congestive heart failure) 06/10/2012  . Morbid obesity 06/10/2012  . History of CVA (cerebrovascular accident) 06/10/2012  . Anemia 06/10/2012  . DDD (degenerative disc disease), lumbar 06/10/2012  . Allergic rhinitis 06/10/2012  . Gout 06/10/2012  . HTN (hypertension) 06/10/2012  . DM (diabetes mellitus) 06/10/2012  . Constipation 06/10/2012  . Urinary incontinence 06/10/2012  . History of breast cancer 06/10/2012  . COPD (chronic obstructive pulmonary disease) 06/10/2012    RAMSEUR,CHRIS, PTA 09/22/2014, 5:17 PM  [facility] [address] [place], [place], [address] Phone: [phone]   Fax:  [phone]

## 2014-09-23 DIAGNOSIS — R32 Unspecified urinary incontinence: Secondary | ICD-10-CM | POA: Diagnosis not present

## 2014-09-23 DIAGNOSIS — J449 Chronic obstructive pulmonary disease, unspecified: Secondary | ICD-10-CM | POA: Diagnosis not present

## 2014-09-23 DIAGNOSIS — E119 Type 2 diabetes mellitus without complications: Secondary | ICD-10-CM | POA: Diagnosis not present

## 2014-09-23 DIAGNOSIS — D649 Anemia, unspecified: Secondary | ICD-10-CM | POA: Diagnosis not present

## 2014-09-23 LAB — TSH: TSH: 1.7 u[IU]/mL (ref 0.450–4.500)

## 2014-09-28 ENCOUNTER — Other Ambulatory Visit: Payer: Self-pay | Admitting: Family Medicine

## 2014-09-28 DIAGNOSIS — R29898 Other symptoms and signs involving the musculoskeletal system: Secondary | ICD-10-CM | POA: Diagnosis not present

## 2014-09-28 DIAGNOSIS — I69359 Hemiplegia and hemiparesis following cerebral infarction affecting unspecified side: Secondary | ICD-10-CM | POA: Diagnosis not present

## 2014-09-28 DIAGNOSIS — M6289 Other specified disorders of muscle: Secondary | ICD-10-CM | POA: Diagnosis not present

## 2014-10-01 ENCOUNTER — Telehealth: Payer: Self-pay | Admitting: Family Medicine

## 2014-10-01 DIAGNOSIS — M6289 Other specified disorders of muscle: Secondary | ICD-10-CM | POA: Diagnosis not present

## 2014-10-01 DIAGNOSIS — R29898 Other symptoms and signs involving the musculoskeletal system: Secondary | ICD-10-CM | POA: Diagnosis not present

## 2014-10-01 DIAGNOSIS — I69359 Hemiplegia and hemiparesis following cerebral infarction affecting unspecified side: Secondary | ICD-10-CM | POA: Diagnosis not present

## 2014-10-02 NOTE — Telephone Encounter (Signed)
Spoke with patient and advised to test 1st thing in the am fasting and then as needed if she feels like it may be high or if she is having hypoglycemic episodes.

## 2014-10-04 DIAGNOSIS — R0902 Hypoxemia: Secondary | ICD-10-CM | POA: Diagnosis not present

## 2014-10-04 DIAGNOSIS — I519 Heart disease, unspecified: Secondary | ICD-10-CM | POA: Diagnosis not present

## 2014-10-04 DIAGNOSIS — J449 Chronic obstructive pulmonary disease, unspecified: Secondary | ICD-10-CM | POA: Diagnosis not present

## 2014-10-05 DIAGNOSIS — R29898 Other symptoms and signs involving the musculoskeletal system: Secondary | ICD-10-CM | POA: Diagnosis not present

## 2014-10-05 DIAGNOSIS — I69359 Hemiplegia and hemiparesis following cerebral infarction affecting unspecified side: Secondary | ICD-10-CM | POA: Diagnosis not present

## 2014-10-05 DIAGNOSIS — M6289 Other specified disorders of muscle: Secondary | ICD-10-CM | POA: Diagnosis not present

## 2014-10-07 ENCOUNTER — Other Ambulatory Visit: Payer: Self-pay | Admitting: Family Medicine

## 2014-10-07 DIAGNOSIS — E119 Type 2 diabetes mellitus without complications: Secondary | ICD-10-CM | POA: Diagnosis not present

## 2014-10-07 DIAGNOSIS — H524 Presbyopia: Secondary | ICD-10-CM | POA: Diagnosis not present

## 2014-10-07 DIAGNOSIS — H52223 Regular astigmatism, bilateral: Secondary | ICD-10-CM | POA: Diagnosis not present

## 2014-10-07 LAB — HM DIABETES EYE EXAM

## 2014-10-08 DIAGNOSIS — M6289 Other specified disorders of muscle: Secondary | ICD-10-CM | POA: Diagnosis not present

## 2014-10-08 DIAGNOSIS — R29898 Other symptoms and signs involving the musculoskeletal system: Secondary | ICD-10-CM | POA: Diagnosis not present

## 2014-10-08 DIAGNOSIS — I69359 Hemiplegia and hemiparesis following cerebral infarction affecting unspecified side: Secondary | ICD-10-CM | POA: Diagnosis not present

## 2014-10-12 DIAGNOSIS — J449 Chronic obstructive pulmonary disease, unspecified: Secondary | ICD-10-CM | POA: Diagnosis not present

## 2014-10-12 DIAGNOSIS — R0902 Hypoxemia: Secondary | ICD-10-CM | POA: Diagnosis not present

## 2014-10-12 DIAGNOSIS — I519 Heart disease, unspecified: Secondary | ICD-10-CM | POA: Diagnosis not present

## 2014-10-13 ENCOUNTER — Telehealth: Payer: Self-pay | Admitting: Family Medicine

## 2014-10-13 DIAGNOSIS — I69359 Hemiplegia and hemiparesis following cerebral infarction affecting unspecified side: Secondary | ICD-10-CM | POA: Diagnosis not present

## 2014-10-13 DIAGNOSIS — M6289 Other specified disorders of muscle: Secondary | ICD-10-CM | POA: Diagnosis not present

## 2014-10-13 DIAGNOSIS — E11610 Type 2 diabetes mellitus with diabetic neuropathic arthropathy: Secondary | ICD-10-CM

## 2014-10-13 DIAGNOSIS — R29898 Other symptoms and signs involving the musculoskeletal system: Secondary | ICD-10-CM | POA: Diagnosis not present

## 2014-10-13 MED ORDER — HYDROCODONE-ACETAMINOPHEN 7.5-325 MG PO TABS: 1.0000 | ORAL_TABLET | Freq: Two times a day (BID) | ORAL | Status: DC | PRN

## 2014-10-13 NOTE — Telephone Encounter (Signed)
Patient aware that rx is ready to be picked up.  

## 2014-10-13 NOTE — Telephone Encounter (Signed)
Last filled 4/26

## 2014-10-15 DIAGNOSIS — M6289 Other specified disorders of muscle: Secondary | ICD-10-CM | POA: Diagnosis not present

## 2014-10-15 DIAGNOSIS — I69359 Hemiplegia and hemiparesis following cerebral infarction affecting unspecified side: Secondary | ICD-10-CM | POA: Diagnosis not present

## 2014-10-15 DIAGNOSIS — R29898 Other symptoms and signs involving the musculoskeletal system: Secondary | ICD-10-CM | POA: Diagnosis not present

## 2014-10-20 DIAGNOSIS — I69359 Hemiplegia and hemiparesis following cerebral infarction affecting unspecified side: Secondary | ICD-10-CM | POA: Diagnosis not present

## 2014-10-20 DIAGNOSIS — R29898 Other symptoms and signs involving the musculoskeletal system: Secondary | ICD-10-CM | POA: Diagnosis not present

## 2014-10-20 DIAGNOSIS — M6289 Other specified disorders of muscle: Secondary | ICD-10-CM | POA: Diagnosis not present

## 2014-10-23 DIAGNOSIS — J449 Chronic obstructive pulmonary disease, unspecified: Secondary | ICD-10-CM | POA: Diagnosis not present

## 2014-10-23 DIAGNOSIS — R32 Unspecified urinary incontinence: Secondary | ICD-10-CM | POA: Diagnosis not present

## 2014-10-23 DIAGNOSIS — D649 Anemia, unspecified: Secondary | ICD-10-CM | POA: Diagnosis not present

## 2014-10-23 DIAGNOSIS — E119 Type 2 diabetes mellitus without complications: Secondary | ICD-10-CM | POA: Diagnosis not present

## 2014-10-26 ENCOUNTER — Other Ambulatory Visit: Payer: Self-pay | Admitting: Family Medicine

## 2014-10-26 DIAGNOSIS — R278 Other lack of coordination: Secondary | ICD-10-CM | POA: Diagnosis not present

## 2014-10-26 DIAGNOSIS — Z5189 Encounter for other specified aftercare: Secondary | ICD-10-CM | POA: Diagnosis not present

## 2014-10-26 DIAGNOSIS — I69359 Hemiplegia and hemiparesis following cerebral infarction affecting unspecified side: Secondary | ICD-10-CM | POA: Diagnosis not present

## 2014-10-26 DIAGNOSIS — R269 Unspecified abnormalities of gait and mobility: Secondary | ICD-10-CM | POA: Diagnosis not present

## 2014-10-27 DIAGNOSIS — M21371 Foot drop, right foot: Secondary | ICD-10-CM | POA: Diagnosis not present

## 2014-10-29 DIAGNOSIS — I69359 Hemiplegia and hemiparesis following cerebral infarction affecting unspecified side: Secondary | ICD-10-CM | POA: Diagnosis not present

## 2014-10-29 DIAGNOSIS — R278 Other lack of coordination: Secondary | ICD-10-CM | POA: Diagnosis not present

## 2014-10-30 ENCOUNTER — Ambulatory Visit (INDEPENDENT_AMBULATORY_CARE_PROVIDER_SITE_OTHER): Payer: Medicare Other | Admitting: Pharmacist

## 2014-10-30 ENCOUNTER — Encounter: Payer: Self-pay | Admitting: Pharmacist

## 2014-10-30 VITALS — BP 136/80 | HR 70 | Ht 64.0 in | Wt 225.0 lb

## 2014-10-30 DIAGNOSIS — Z7901 Long term (current) use of anticoagulants: Secondary | ICD-10-CM

## 2014-10-30 DIAGNOSIS — Z0000 Encounter for general adult medical examination without abnormal findings: Secondary | ICD-10-CM

## 2014-10-30 DIAGNOSIS — Z8673 Personal history of transient ischemic attack (TIA), and cerebral infarction without residual deficits: Secondary | ICD-10-CM | POA: Diagnosis not present

## 2014-10-30 DIAGNOSIS — Z1211 Encounter for screening for malignant neoplasm of colon: Secondary | ICD-10-CM | POA: Diagnosis not present

## 2014-10-30 DIAGNOSIS — R300 Dysuria: Secondary | ICD-10-CM

## 2014-10-30 DIAGNOSIS — E1159 Type 2 diabetes mellitus with other circulatory complications: Secondary | ICD-10-CM | POA: Diagnosis not present

## 2014-10-30 LAB — POCT INR: INR: 2.5

## 2014-10-30 NOTE — Addendum Note (Signed)
Addended by: Cherre Robins on: 10/30/2014 03:18 PM   Modules accepted: Level of Service

## 2014-10-30 NOTE — Patient Instructions (Signed)
  Brittany Clarke , Thank you for taking time to come for your Medicare Wellness Visit. I appreciate your ongoing commitment to your health goals. Please review the following plan we discussed and let me know if I can assist you in the future.    This is a list of the screening recommended for you and due dates:  Health Maintenance  Topic Date Due  . Eye exam for diabetics  Done 09/2014  . Urine Protein Check  12/26/1951 - sample cup given today to return  . Pneumonia vaccines (2 of 2 - PPSV23) 12/06/2014  . Hemoglobin A1C  12/16/2014  . Flu Shot  12/21/2014  . Complete foot exam   04/24/2015  . Mammogram  06/03/2016  . Colon Cancer Screening  02/21/2023  . Tetanus Vaccine  12/06/2023  . DEXA scan (bone density measurement)  Will get at next visit 12/02/2014  . Shingles Vaccine  Completed    Anticoagulation Dose Instructions as of 10/30/2014      Dorene Grebe Tue Wed Thu Fri Sat   New Dose 5 mg 2.5 mg 5 mg 2.5 mg 5 mg 2.5 mg 5 mg    Description        Continue warfarin 5mg  1/2 tablet on mondays, wednesdays and fridays and 1 tablet all other days.

## 2014-10-30 NOTE — Progress Notes (Signed)
Patient ID: Brittany Clarke, female   DOB: [birth year], 74 y.o.   MRN: [id number]  When documenting that AWV was performed today it was noted that an AWV was performed 09/08/2014.  The appointment for today was documented as scheduled in the [facility] but must have been missed.  Changed visit charge to level 1 since patient was also seen for protime.

## 2014-10-30 NOTE — Progress Notes (Signed)
Patient ID: Brittany Clarke, female   DOB: Jul 21, 1940, 74 y.o.   MRN: [id number]    Subjective:   Brittany Clarke is a 74 y.o. female who presents for an Initial Medicare Annual Wellness Visit and are check protime.  Patient is on chronic anticoagualtion therapy after CVA.   Patient is a pleasant obese black female.  She presents to office accompanied by her nursing assistant who is with her daily to help with cooking, cleaning, dressing and bathing.  Patient is wheelchair bound.   Current Medications (verified) Outpatient Encounter Prescriptions as of 10/30/2014  Medication Sig  . acetaminophen (TYLENOL) 500 MG tablet Take 500 mg by mouth every 6 (six) hours as needed. For pain  . allopurinol (ZYLOPRIM) 300 MG tablet TAKE ONE (1) TABLET EACH DAY  . atorvastatin (LIPITOR) 40 MG tablet TAKE ONE (1) TABLET EACH DAY  . baclofen (LIORESAL) 10 MG tablet TAKE 1/2 TABLET 3 TIMES DAILY  . clotrimazole-betamethasone (LOTRISONE) cream Apply 1 application topically 2 (two) times daily.  . feeding supplement (GLUCERNA SHAKE) LIQD Take 237 mLs by mouth 3 (three) times daily between meals.  . furosemide (LASIX) 20 MG tablet Take 20 mg by mouth daily as needed for fluid (takes every other day).   Marland Kitchen gabapentin (NEURONTIN) 100 MG capsule Take 100 mg by mouth 2 (two) times daily.  Marland Kitchen gabapentin (NEURONTIN) 400 MG capsule TAKE 1 CAPSULE DAILY EVERY EVENING  . HYDROcodone-acetaminophen (NORCO) 7.5-325 MG per tablet Take 1 tablet by mouth 2 (two) times daily as needed for moderate pain.  Marland Kitchen loratadine (CLARITIN) 10 MG tablet TAKE ONE (1) TABLET EACH DAY  . losartan (COZAAR) 25 MG tablet TAKE ONE (1) TABLET EACH DAY  . metFORMIN (GLUCOPHAGE) 500 MG tablet TAKE ONE TABLET TWICE A DAY WITH FOOD  . metoprolol tartrate (LOPRESSOR) 25 MG tablet TAKE 1/2 TABLET TWICE DAILY  . Misc. Devices (O-RING CUSHION) MISC 1 each by Does not apply route as needed (to relieve pressure on sacrum).  . montelukast (SINGULAIR) 10 MG tablet  TAKE 1 TABLET DAILY IN THE EVENING  . neomycin-polymyxin-hydrocortisone (CORTISPORIN) 3.5-10000-1 otic suspension Place 3 drops into the left ear daily as needed (Ear Pain).  . polyethylene glycol (MIRALAX / GLYCOLAX) packet Take 17 g by mouth daily.  Marland Kitchen warfarin (COUMADIN) 5 MG tablet TAKE ONE (1) TABLET EACH DAY  . [DISCONTINUED] polyethylene glycol powder (GLYCOLAX/MIRALAX) powder Take 17 g by mouth daily.   No facility-administered encounter medications on file as of 10/30/2014.    Allergies (verified) Ultram   History: Past Medical History  Diagnosis Date  . CHF (congestive heart failure)   . Diabetes mellitus without complication   . COPD (chronic obstructive pulmonary disease)   . Hypertension   . Asthma   . Shortness of breath   . Stroke     2013  . Anxiety   . Pneumonia   . Gout   . 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 Anticoagulant long-term use    Past Surgical History  Procedure Laterality Date  . Tubal ligation    . Colonoscopy  04/04/2002    CBS:[id number] hemorrhoids, otherwise, normal rectum/Scattered left sided diverticula/Somewhat adenomatous appearing ileocecal valve which is probably normal but this area was biopsied. The remainder of the colonic mucosa appeared   normal  . Trigger finger surgery      twice  . Colonoscopy with esophagogastroduodenoscopy (egd) N/A 02/20/2013    Procedure: COLONOSCOPY WITH ESOPHAGOGASTRODUODENOSCOPY (EGD);  Surgeon:  Daneil Dolin, MD;  [facility];  Service: Endoscopy;  Laterality: N/A;  . Breast surgery Left 2005    lumpectomy and removed lymph nodes   Family History  Problem Relation Age of Onset  . Colon cancer Neg Hx   . Liver disease Neg Hx   . Heart disease Mother   . Hypertension Mother   . Cancer Father     lung  . Cancer Brother     prostate  . Diabetes Brother   . Diabetes Sister   . Cancer Sister     breast cancer  . Hypertension Sister   . Diabetes Sister   .  Hypertension Sister   . Diabetes Sister   . Hypertension Sister   . Hypertension Sister   . Cancer Brother   . Diabetes Brother   . Diabetes Brother    Social History   Occupational History  . Not on file.   Social History Main Topics  . Smoking status: Never Smoker   . Smokeless tobacco: Never Used  . Alcohol Use: No  . Drug Use: No  . Sexual Activity: No   Do you feel safe at home?  Yes  Dietary issues and exercise activities: Current Exercise Habits:: Exercise is limited by, Limited by:: orthopedic condition(s)  Current Dietary habits:  Not following any specific dietary recommendations.   Objective:    Today's Vitals   10/30/14 1209  BP: 136/80  Pulse: 70  Height: 5\' 4"  (1.626 m)  Weight: 225 lb (102.059 kg)  PainSc: 8    Body mass index is 38.6 kg/(m^2).   INR was 2.5 today  Activities of Daily Living In your present state of health, do you have any difficulty performing the following activities: 10/30/2014 09/08/2014  Hearing? N N  Vision? N N  Difficulty concentrating or making decisions? N N  Walking or climbing stairs? Y Y  Dressing or bathing? Y Y  Doing errands, shopping? Brittany Clarke  Preparing Food and eating ? Y Y  Using the Toilet? Y Y  In the past six months, have you accidently leaked urine? Y Y  Do you have problems with loss of bowel control? N N  Managing your Medications? Y Y  Managing your Finances? N Y  Housekeeping or managing your Housekeeping? Y Y    Are there smokers in your home (other than you)? No   Cardiac Risk Factors include: diabetes mellitus;advanced age (>20men, >47 women);family history of premature cardiovascular disease;hypertension;obesity (BMI >30kg/m2)  Depression Screen PHQ 2/9 Scores 10/30/2014 09/08/2014 03/12/2014 07/29/2013  PHQ - 2 Score 0 0 0 0    Fall Risk Fall Risk  10/30/2014 09/08/2014 03/12/2014 07/29/2013  Falls in the past year? No No No No  Risk for fall due to : - Impaired balance/gait;Impaired mobility - -      Cognitive Function: MMSE - Mini Mental State Exam 10/30/2014 09/08/2014 09/08/2014  Not completed: - (No Data) Refused  Orientation to time 5 5 -  Orientation to Place 5 4 -  Registration 3 3 -  Attention/ Calculation 3 0 -  Recall 3 3 -  Language- name 2 objects 2 2 -  Language- repeat 1 1 -  Language- follow 3 step command 3 3 -  Language- read & follow direction 1 1 -  Write a sentence 1 1 -  Copy design 0 1 -  Total score 27 24 -    Immunizations and Health Maintenance Immunization History  Administered Date(s) Administered  .  Influenza,inj,Quad PF,36+ Mos 04/16/2013, 03/12/2014  . Pneumococcal Conjugate-13 12/05/2013  . Tdap 12/05/2013  . Zoster 09/08/2014   Health Maintenance Due  Topic Date Due  . OPHTHALMOLOGY EXAM  12/26/1951  . URINE MICROALBUMIN  12/26/1951  . DEXA SCAN  06/02/2010    Patient Care Team: [name], MD as PCP - General (Family Medicine)  Indicate any recent Medical Services you may have received from other than [facility] providers in the past year (date may be approximate).    Assessment:    Annual Wellness Visit Therapeutic anticoagualtion  Type 2 DM - last A1c was 6.3% which shows good control   Screening Tests Health Maintenance  Topic Date Due  . OPHTHALMOLOGY EXAM  12/26/1951  . URINE MICROALBUMIN  12/26/1951  . DEXA SCAN  06/02/2010  . PNA vac Low Risk Adult (2 of 2 - PPSV23) 12/06/2014  . HEMOGLOBIN A1C  12/16/2014  . INFLUENZA VACCINE  12/21/2014  . FOOT EXAM  04/24/2015  . MAMMOGRAM  06/03/2016  . COLONOSCOPY  02/21/2023  . TETANUS/TDAP  12/06/2023  . ZOSTAVAX  Completed        Plan:   During the course of the visit Brittany Clarke was educated and counseled about the following appropriate screening and preventive services:   Vaccines to include Pneumoccal, Influenza, Hepatitis B, Td, Zostavax - UTD  Colorectal cancer screening - her last FOBT was over a year ago and was positive.  Patient given FOBT today in office  to take home and return.  Las colonoscopy was 2014  Cardiovascular disease screening - BP at goal today.  Last LDL was 84.  Diabetes - due to have microalbumin rechecked but patient  unable to void today in office -urine specimen cup given to patient to return.  Also she c/o of some dysuria - will also culture specimen when returned.  Bone Denisty / Osteoporosis Screening - due to transportation issues and xray being backed up patient is not able to get DEXA today but will get in 1 month at next visit.   Mammogram - UTD  Glaucoma screening / Diabetic Eye Exam - UTD per patient doen 09/2014 - requested documentation from Dr Rona Ravens.  Nutrition counseling -   Increase non-starchy vegetables - carrots, green  bean, squash, zucchini, tomatoes, onions, peppers,  spinach and other green leafy vegetables, cabbage,  lettuce, cucumbers, asparagus, okra (not fried),  eggplant   limit sugar and processed foods (cakes, cookies, ice  cream, crackers and chips)    Increase fresh fruit but limit serving sizes 1/2 cup or  about the size of tennis or baseball   limit red meat to no more than 1-2 times per week  (serving size about the size of your palm) Choose  whole grains / lean proteins - whole wheat bread,  quinoa, whole grain rice (1/2 cup), fish, chicken,  [place] -Advanced Directives- patient already has paperwork at home.   Orders Placed This Encounter  Procedures  . Fecal occult blood, imunochemical    Standing Status: Future     Number of Occurrences:      Standing Expiration Date: 01/15/2015  . Microalbumin / creatinine urine ratio    Standing Status: Future     Number of Occurrences:      Standing Expiration Date: 01/14/2015  . POCT INR    Standing Status: Standing     Number of Occurrences: 42     Standing Expiration Date: 11/12/2015  . POCT urinalysis dipstick    Standing Status: Future  Number of Occurrences:      Standing Expiration Date: 01/15/2015  . POCT UA - Microscopic Only     Standing Status: Future     Number of Occurrences:      Standing Expiration Date: 01/15/2015     Anticoagulation Dose Instructions as of 10/30/2014      Dorene Grebe Tue Wed Thu Fri Sat   New Dose 5 mg 2.5 mg 5 mg 2.5 mg 5 mg 2.5 mg 5 mg    Description        Continue warfarin 5mg  1/2 tablet on mondays, wednesdays and fridays and 1 tablet all other days.         Patient Instructions (the written plan) were given to the patient.   Cherre Robins, [facility]   10/30/2014

## 2014-11-02 ENCOUNTER — Encounter: Payer: Self-pay | Admitting: *Deleted

## 2014-11-02 DIAGNOSIS — R278 Other lack of coordination: Secondary | ICD-10-CM | POA: Diagnosis not present

## 2014-11-02 DIAGNOSIS — I69359 Hemiplegia and hemiparesis following cerebral infarction affecting unspecified side: Secondary | ICD-10-CM | POA: Diagnosis not present

## 2014-11-03 ENCOUNTER — Other Ambulatory Visit: Payer: Self-pay | Admitting: Family Medicine

## 2014-11-04 DIAGNOSIS — R0902 Hypoxemia: Secondary | ICD-10-CM | POA: Diagnosis not present

## 2014-11-04 DIAGNOSIS — I519 Heart disease, unspecified: Secondary | ICD-10-CM | POA: Diagnosis not present

## 2014-11-04 DIAGNOSIS — J449 Chronic obstructive pulmonary disease, unspecified: Secondary | ICD-10-CM | POA: Diagnosis not present

## 2014-11-05 ENCOUNTER — Telehealth: Payer: Self-pay

## 2014-11-05 NOTE — Telephone Encounter (Signed)
I am having to do a prior authorization for Nystatin-Triamcinolone ointment   Please advise what the dx is for this medication???

## 2014-11-05 NOTE — Telephone Encounter (Signed)
No PA needed. Patient using Lotrisone.

## 2014-11-09 ENCOUNTER — Telehealth: Payer: Self-pay | Admitting: *Deleted

## 2014-11-09 DIAGNOSIS — I69359 Hemiplegia and hemiparesis following cerebral infarction affecting unspecified side: Secondary | ICD-10-CM | POA: Diagnosis not present

## 2014-11-09 DIAGNOSIS — R278 Other lack of coordination: Secondary | ICD-10-CM | POA: Diagnosis not present

## 2014-11-09 DIAGNOSIS — E11610 Type 2 diabetes mellitus with diabetic neuropathic arthropathy: Secondary | ICD-10-CM

## 2014-11-09 MED ORDER — HYDROCODONE-ACETAMINOPHEN 7.5-325 MG PO TABS: 1.0000 | ORAL_TABLET | Freq: Two times a day (BID) | ORAL | Status: DC | PRN

## 2014-11-09 NOTE — Telephone Encounter (Signed)
We can refill when time has come based on usage as prescribed

## 2014-11-09 NOTE — Telephone Encounter (Signed)
Refilled per Dr. Sabra Heck as insurance will allow prescription to go through now.  Patient's daughter notified.

## 2014-11-09 NOTE — Telephone Encounter (Addendum)
Patient's caregiver called- requesting refill on patient's hydrocodone last written 10/13/14.

## 2014-11-12 ENCOUNTER — Emergency Department (HOSPITAL_COMMUNITY): Payer: Medicare Other

## 2014-11-12 ENCOUNTER — Emergency Department (HOSPITAL_COMMUNITY)
Admission: EM | Admit: 2014-11-12 | Discharge: 2014-11-12 | Disposition: A | Discharge status: Home / Self Care | Payer: Medicare Other | Attending: Emergency Medicine | Admitting: Emergency Medicine

## 2014-11-12 ENCOUNTER — Encounter (HOSPITAL_COMMUNITY): Payer: Self-pay | Admitting: Emergency Medicine

## 2014-11-12 DIAGNOSIS — M47812 Spondylosis without myelopathy or radiculopathy, cervical region: Secondary | ICD-10-CM | POA: Diagnosis not present

## 2014-11-12 DIAGNOSIS — Z79899 Other long term (current) drug therapy: Secondary | ICD-10-CM | POA: Diagnosis not present

## 2014-11-12 DIAGNOSIS — Z8701 Personal history of pneumonia (recurrent): Secondary | ICD-10-CM | POA: Insufficient documentation

## 2014-11-12 DIAGNOSIS — Y9389 Activity, other specified: Secondary | ICD-10-CM | POA: Insufficient documentation

## 2014-11-12 DIAGNOSIS — I519 Heart disease, unspecified: Secondary | ICD-10-CM | POA: Diagnosis not present

## 2014-11-12 DIAGNOSIS — S199XXA Unspecified injury of neck, initial encounter: Secondary | ICD-10-CM | POA: Diagnosis not present

## 2014-11-12 DIAGNOSIS — M542 Cervicalgia: Secondary | ICD-10-CM

## 2014-11-12 DIAGNOSIS — Z7901 Long term (current) use of anticoagulants: Secondary | ICD-10-CM | POA: Diagnosis not present

## 2014-11-12 DIAGNOSIS — M109 Gout, unspecified: Secondary | ICD-10-CM | POA: Diagnosis not present

## 2014-11-12 DIAGNOSIS — F419 Anxiety disorder, unspecified: Secondary | ICD-10-CM | POA: Insufficient documentation

## 2014-11-12 DIAGNOSIS — J449 Chronic obstructive pulmonary disease, unspecified: Secondary | ICD-10-CM | POA: Insufficient documentation

## 2014-11-12 DIAGNOSIS — E119 Type 2 diabetes mellitus without complications: Secondary | ICD-10-CM | POA: Diagnosis not present

## 2014-11-12 DIAGNOSIS — I10 Essential (primary) hypertension: Secondary | ICD-10-CM | POA: Insufficient documentation

## 2014-11-12 DIAGNOSIS — Z8579 Personal history of other malignant neoplasms of lymphoid, hematopoietic and related tissues: Secondary | ICD-10-CM | POA: Diagnosis not present

## 2014-11-12 DIAGNOSIS — R0902 Hypoxemia: Secondary | ICD-10-CM | POA: Diagnosis not present

## 2014-11-12 DIAGNOSIS — I509 Heart failure, unspecified: Secondary | ICD-10-CM | POA: Insufficient documentation

## 2014-11-12 DIAGNOSIS — Z8673 Personal history of transient ischemic attack (TIA), and cerebral infarction without residual deficits: Secondary | ICD-10-CM | POA: Insufficient documentation

## 2014-11-12 DIAGNOSIS — S0990XA Unspecified injury of head, initial encounter: Secondary | ICD-10-CM | POA: Diagnosis not present

## 2014-11-12 DIAGNOSIS — Y9289 Other specified places as the place of occurrence of the external cause: Secondary | ICD-10-CM | POA: Insufficient documentation

## 2014-11-12 DIAGNOSIS — Y998 Other external cause status: Secondary | ICD-10-CM | POA: Insufficient documentation

## 2014-11-12 DIAGNOSIS — X58XXXA Exposure to other specified factors, initial encounter: Secondary | ICD-10-CM | POA: Diagnosis not present

## 2014-11-12 DIAGNOSIS — Z853 Personal history of malignant neoplasm of breast: Secondary | ICD-10-CM | POA: Insufficient documentation

## 2014-11-12 MED ORDER — OXYCODONE-ACETAMINOPHEN 5-325 MG PO TABS
ORAL_TABLET | ORAL | Status: AC
  Filled 2014-11-12: qty 1

## 2014-11-12 MED ORDER — OXYCODONE-ACETAMINOPHEN 5-325 MG PO TABS
2.0000 | ORAL_TABLET | Freq: Once | ORAL | Status: AC
  Administered 2014-11-12: 2 via ORAL
  Filled 2014-11-12: qty 2

## 2014-11-12 MED ORDER — DIAZEPAM 5 MG PO TABS
5.0000 mg | ORAL_TABLET | Freq: Once | ORAL | Status: AC
  Administered 2014-11-12: 5 mg via ORAL
  Filled 2014-11-12: qty 1

## 2014-11-12 NOTE — ED Notes (Signed)
Pt c/o having headaches upon waking in the am recently. Pt c/o right side neck pain today, states she felt a "pop" in it x 2 days ago. H/s cva with ride side weakness.

## 2014-11-12 NOTE — ED Notes (Signed)
MD at bedside. 

## 2014-11-12 NOTE — ED Provider Notes (Signed)
CSN: [id number]     Arrival date & time 11/12/14  0715 History   First MD Initiated Contact with Patient 11/12/14 [phone]     Chief Complaint  Patient presents with  . Neck Pain     (Consider location/radiation/quality/duration/timing/severity/associated sxs/prior Treatment) HPI   74 year old female with headaches. Will go with a headache this morning. Has been waking up with headaches over the past few days. They typically resolve as the day goes on. Now also having right-sided neck pain. She felt a "pop" and it she was sitting on her today. Persistent pain right side of her neck since then. Worse with movement. Hx of CVA with R sided weakness. No acute numbness, tingling loss of strength. No fevers or chills.  Past Medical History  Diagnosis Date  . CHF (congestive heart failure)   . Diabetes mellitus without complication   . COPD (chronic obstructive pulmonary disease)   . Hypertension   . Asthma   . Shortness of breath   . Stroke     2013  . Anxiety   . Pneumonia   . Gout   . 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 Anticoagulant long-term use    Past Surgical History  Procedure Laterality Date  . Tubal ligation    . Colonoscopy  04/04/2002    DUK:GURKYHCW hemorrhoids, otherwise, normal rectum/Scattered left sided diverticula/Somewhat adenomatous appearing ileocecal valve which is probably normal but this area was biopsied. The remainder of the colonic mucosa appeared   normal  . Trigger finger surgery      twice  . Colonoscopy with esophagogastroduodenoscopy (egd) N/A 02/20/2013    Procedure: COLONOSCOPY WITH ESOPHAGOGASTRODUODENOSCOPY (EGD);  Surgeon: Daneil Dolin, MD;  Location: [facility];  Service: Endoscopy;  Laterality: N/A;  . Breast surgery Left 2005    lumpectomy and removed lymph nodes   Family History  Problem Relation Age of Onset  . Colon cancer Neg Hx   . Liver disease Neg Hx   . Heart disease Mother   . Hypertension Mother   .  Cancer Father     lung  . Cancer Brother     prostate  . Diabetes Brother   . Diabetes Sister   . Cancer Sister     breast cancer  . Hypertension Sister   . Diabetes Sister   . Hypertension Sister   . Diabetes Sister   . Hypertension Sister   . Hypertension Sister   . Cancer Brother   . Diabetes Brother   . Diabetes Brother    History  Substance Use Topics  . Smoking status: Never Smoker   . Smokeless tobacco: Never Used  . Alcohol Use: No   OB History    No data available     Review of Systems  All systems reviewed and negative, other than as noted in HPI.   Allergies  Ultram  Home Medications   Prior to Admission medications   Medication Sig Start Date End Date Taking? Authorizing Provider  acetaminophen (TYLENOL) 500 MG tablet Take 500 mg by mouth every 6 (six) hours as needed. For pain    Historical Provider, MD  allopurinol (ZYLOPRIM) 300 MG tablet TAKE ONE (1) TABLET EACH DAY 10/08/14   [name], MD  atorvastatin (LIPITOR) 40 MG tablet TAKE ONE (1) TABLET EACH DAY 08/09/14   [name], MD  baclofen (LIORESAL) 10 MG tablet TAKE 1/2 TABLET 3 TIMES DAILY 10/27/14   Annie Main  Loleta Chance, MD  clotrimazole-betamethasone (LOTRISONE) cream APPLY TWICE DAILY 11/03/14   [name], MD  feeding supplement (GLUCERNA SHAKE) LIQD Take 237 mLs by mouth 3 (three) times daily between meals. 06/12/12   Bobby Rumpf York, PA-C  furosemide (LASIX) 20 MG tablet Take 20 mg by mouth daily as needed for fluid (takes every other day).  12/02/12   Vernie Shanks, MD  gabapentin (NEURONTIN) 100 MG capsule Take 100 mg by mouth 2 (two) times daily.    Historical Provider, MD  gabapentin (NEURONTIN) 400 MG capsule TAKE 1 CAPSULE DAILY EVERY EVENING 10/08/14   [name], MD  HYDROcodone-acetaminophen (NORCO) 7.5-325 MG per tablet Take 1 tablet by mouth 2 (two) times daily as needed for moderate pain. 11/09/14   [name], MD  loratadine (CLARITIN) 10 MG tablet TAKE ONE  (1) TABLET EACH DAY 07/06/14   [name], MD  losartan (COZAAR) 25 MG tablet TAKE ONE (1) TABLET EACH DAY 09/07/14   [name], MD  metFORMIN (GLUCOPHAGE) 500 MG tablet TAKE ONE TABLET TWICE A DAY WITH FOOD 08/27/14   [name], MD  metoprolol tartrate (LOPRESSOR) 25 MG tablet TAKE 1/2 TABLET TWICE DAILY 09/07/14   [name], MD  Misc. Devices (O-RING CUSHION) MISC 1 each by Does not apply route as needed (to relieve pressure on sacrum). 12/05/13   [name], MD  montelukast (SINGULAIR) 10 MG tablet TAKE 1 TABLET DAILY IN THE EVENING 08/05/14   [name], MD  neomycin-polymyxin-hydrocortisone (CORTISPORIN) 3.5-10000-1 otic suspension Place 3 drops into the left ear daily as needed (Ear Pain). 09/22/14   [name], MD  polyethylene glycol [facility] / Floria Raveling) packet Take 17 g by mouth daily. 09/22/14   [name], MD  warfarin (COUMADIN) 5 MG tablet TAKE ONE (1) TABLET EACH DAY 09/18/14   [name], MD   BP 153/77 mmHg  Pulse 74  Temp(Src) 98.6 F (37 C) (Oral)  Resp 18  Ht 5\' 5"  (1.651 m)  Wt 225 lb (102.059 kg)  BMI 37.44 kg/m2  SpO2 100% Physical Exam  Constitutional: She is oriented to person, place, and time. She appears well-developed and well-nourished. No distress.  HENT:  Head: Normocephalic and atraumatic.  Eyes: Conjunctivae are normal. Right eye exhibits no discharge. Left eye exhibits no discharge.  Neck: Neck supple.  Cervical tenderness in the midline and in the right lateral neck. No concerning skin changes.  Cardiovascular: Normal rate, regular rhythm and normal heart sounds.  Exam reveals no gallop and no friction rub.   No murmur heard. Pulmonary/Chest: Effort normal and breath sounds normal. No respiratory distress.  Abdominal: Soft. She exhibits no distension. There is no tenderness.  Musculoskeletal: She exhibits no edema or tenderness.  Neurological: She is alert and oriented to person, place, and time. No cranial  nerve deficit. She exhibits normal muscle tone.  Decreased strength right side. Strength left side is normal.  Skin: Skin is warm and dry.  Psychiatric: She has a normal mood and affect. Her behavior is normal. Thought content normal.  Nursing note and vitals reviewed.   ED Course  Procedures (including critical care time) Labs Review Labs Reviewed - No data to display  Imaging Review No results found.   EKG Interpretation   Date/Time:  Thursday November 12 2014 07:18:51 EDT Ventricular Rate:  77 PR Interval:  154 QRS Duration: 117 QT Interval:  405 QTC Calculation: 458 R Axis:   -  27 Text Interpretation:  Sinus rhythm Low voltage, precordial leads LVH with  IVCD and secondary repol abnrm Interpretation limited secondary to  artifact Confirmed by Zyren Sevigny  MD, Harmoney Sienkiewicz ([id number]) on 11/12/2014 7:25:22 AM      MDM   Final diagnoses:  Neck pain on right side    73yF with HA and neck pain. No acute neuro complaints. Negative imaging of c-spine. Symptomatic tx. It has been determined that no acute conditions requiring further emergency intervention are present at this time. The patient has been advised of the diagnosis and plan. I reviewed any labs and imaging including any potential incidental findings. We have discussed signs and symptoms that warrant return to the ED and they are listed in the discharge instructions.      Virgel Manifold, MD 11/16/14 [phone]

## 2014-11-13 DIAGNOSIS — R278 Other lack of coordination: Secondary | ICD-10-CM | POA: Diagnosis not present

## 2014-11-13 DIAGNOSIS — I69359 Hemiplegia and hemiparesis following cerebral infarction affecting unspecified side: Secondary | ICD-10-CM | POA: Diagnosis not present

## 2014-11-13 DIAGNOSIS — Z5189 Encounter for other specified aftercare: Secondary | ICD-10-CM | POA: Diagnosis not present

## 2014-11-13 DIAGNOSIS — R269 Unspecified abnormalities of gait and mobility: Secondary | ICD-10-CM | POA: Diagnosis not present

## 2014-11-16 DIAGNOSIS — R269 Unspecified abnormalities of gait and mobility: Secondary | ICD-10-CM | POA: Diagnosis not present

## 2014-11-16 DIAGNOSIS — Z5189 Encounter for other specified aftercare: Secondary | ICD-10-CM | POA: Diagnosis not present

## 2014-11-16 DIAGNOSIS — R278 Other lack of coordination: Secondary | ICD-10-CM | POA: Diagnosis not present

## 2014-11-16 DIAGNOSIS — I69359 Hemiplegia and hemiparesis following cerebral infarction affecting unspecified side: Secondary | ICD-10-CM | POA: Diagnosis not present

## 2014-11-19 DIAGNOSIS — R269 Unspecified abnormalities of gait and mobility: Secondary | ICD-10-CM | POA: Diagnosis not present

## 2014-11-19 DIAGNOSIS — Z5189 Encounter for other specified aftercare: Secondary | ICD-10-CM | POA: Diagnosis not present

## 2014-11-24 DIAGNOSIS — E119 Type 2 diabetes mellitus without complications: Secondary | ICD-10-CM | POA: Diagnosis not present

## 2014-11-24 DIAGNOSIS — D649 Anemia, unspecified: Secondary | ICD-10-CM | POA: Diagnosis not present

## 2014-11-24 DIAGNOSIS — R32 Unspecified urinary incontinence: Secondary | ICD-10-CM | POA: Diagnosis not present

## 2014-11-24 DIAGNOSIS — J449 Chronic obstructive pulmonary disease, unspecified: Secondary | ICD-10-CM | POA: Diagnosis not present

## 2014-11-25 ENCOUNTER — Encounter: Payer: Self-pay | Admitting: *Deleted

## 2014-11-25 DIAGNOSIS — R269 Unspecified abnormalities of gait and mobility: Secondary | ICD-10-CM | POA: Diagnosis not present

## 2014-11-25 DIAGNOSIS — I69351 Hemiplegia and hemiparesis following cerebral infarction affecting right dominant side: Secondary | ICD-10-CM | POA: Diagnosis not present

## 2014-11-27 DIAGNOSIS — R269 Unspecified abnormalities of gait and mobility: Secondary | ICD-10-CM | POA: Diagnosis not present

## 2014-11-27 DIAGNOSIS — I69351 Hemiplegia and hemiparesis following cerebral infarction affecting right dominant side: Secondary | ICD-10-CM | POA: Diagnosis not present

## 2014-11-30 ENCOUNTER — Other Ambulatory Visit: Payer: Self-pay | Admitting: Family Medicine

## 2014-12-01 DIAGNOSIS — R269 Unspecified abnormalities of gait and mobility: Secondary | ICD-10-CM | POA: Diagnosis not present

## 2014-12-01 DIAGNOSIS — I69351 Hemiplegia and hemiparesis following cerebral infarction affecting right dominant side: Secondary | ICD-10-CM | POA: Diagnosis not present

## 2014-12-02 ENCOUNTER — Ambulatory Visit (INDEPENDENT_AMBULATORY_CARE_PROVIDER_SITE_OTHER): Payer: Medicare Other | Admitting: Pharmacist

## 2014-12-02 ENCOUNTER — Other Ambulatory Visit (INDEPENDENT_AMBULATORY_CARE_PROVIDER_SITE_OTHER): Payer: Medicare Other

## 2014-12-02 VITALS — Ht 65.0 in | Wt 225.0 lb

## 2014-12-02 DIAGNOSIS — Z8673 Personal history of transient ischemic attack (TIA), and cerebral infarction without residual deficits: Secondary | ICD-10-CM

## 2014-12-02 DIAGNOSIS — R300 Dysuria: Secondary | ICD-10-CM

## 2014-12-02 DIAGNOSIS — Z780 Asymptomatic menopausal state: Secondary | ICD-10-CM

## 2014-12-02 DIAGNOSIS — M8580 Other specified disorders of bone density and structure, unspecified site: Secondary | ICD-10-CM | POA: Insufficient documentation

## 2014-12-02 DIAGNOSIS — Z7901 Long term (current) use of anticoagulants: Secondary | ICD-10-CM | POA: Diagnosis not present

## 2014-12-02 DIAGNOSIS — Z1211 Encounter for screening for malignant neoplasm of colon: Secondary | ICD-10-CM | POA: Diagnosis not present

## 2014-12-02 DIAGNOSIS — E1159 Type 2 diabetes mellitus with other circulatory complications: Secondary | ICD-10-CM | POA: Diagnosis not present

## 2014-12-02 LAB — POCT INR: INR: 3.1

## 2014-12-02 LAB — POCT URINALYSIS DIPSTICK
BILIRUBIN UA: NEGATIVE
Glucose, UA: NEGATIVE
Ketones, UA: NEGATIVE
Leukocytes, UA: NEGATIVE
Nitrite, UA: NEGATIVE
Protein, UA: NEGATIVE
UROBILINOGEN UA: NEGATIVE
pH, UA: 8

## 2014-12-02 LAB — POCT UA - MICROSCOPIC ONLY
CASTS, UR, LPF, POC: NEGATIVE
CRYSTALS, UR, HPF, POC: NEGATIVE
Mucus, UA: NEGATIVE
Yeast, UA: NEGATIVE

## 2014-12-02 NOTE — Patient Instructions (Signed)
Anticoagulation Dose Instructions as of 12/02/2014      Brittany Clarke Tue Wed Thu Fri Sat   New Dose 5 mg 2.5 mg 5 mg 2.5 mg 5 mg 2.5 mg 5 mg    Description        Skip today's dose - Wednesday, July 13th.  Then restart usual dose of warfarin 5mg  1/2 tablet on mondays, wednesdays and fridays and 1 tablet all other days.      INR was 3.1 today

## 2014-12-02 NOTE — Progress Notes (Signed)
Patient ID: Brittany Clarke, female   DOB: 1940/10/23, 74 y.o.   MRN: [id number]  [facility] Current Height: Height: 5\' 5"  (165.1 cm)      Max Lifetime Height:  5\' 6"  Current Weight: Weight: 225 lb (102.059 kg)       Ethnicity:African American    HPI: Does pt already have a diagnosis of:  Osteopenia?  No Osteoporosis?  No  Back Pain?  Yes       Kyphosis?  Yes Prior fracture?  Yes - FRACTURED COCYX Med(s) for Osteoporosis/Osteopenia:  none Med(s) previously tried for Osteoporosis/Osteopenia:  none                                                             PMH: Age at menopause:  74 yo Hysterectomy?  No Oophorectomy?  No Thyroid med?  No History of cancer?  No History of digestive disorders (ie Crohn's)?  No Current or previous eating disorders?  No Last Vitamin D Result:  Needed with next blood draw Last GFR Result:  112 (06/17/2014)   FH/SH: Family history of osteoporosis?  No Parent with history of hip fracture?  No Family history of breast cancer?  Yes - sister Exercise?  Yes - wheel chair bound but has been going to physical therapy Smoking?  No Alcohol?  No    Calcium Assessment Calcium Intake  # of servings/day  Calcium mg  Milk (8 oz) 1  x  300  = 300mg   Yogurt (4 oz) 0 x  200 = 0  Cheese (1 oz) 1 x  200 = 200mg   Other Calcium sources   250mg   Ca supplement 500mg  = 500mg    Estimated calcium intake per day 1250mg     DEXA Results Date of Test T-Score for [facility] Spine L1-L4 T-Score for Total Left Hip T-Score for Total Right Hip  12/02/2014 0.1 0.4 -0.2                  Lowest T-Score = -1.1 at neck of right hip  FRAX 10 year estimate: Total FX risk:  7.4%  (consider medication if >/= 20%) Hip FX risk:  1.4%  (consider medication if >/= 3%)  INR was 3.1 today in office  Patient also left urine sample from home and brought in FOBT.   In house urine dip showed trace blood, and all other values were neagative.  In house urine analysis showed WBC  and RBC, occ bacteria and epithelial cells. Urine sent out for culture and sensitivity.    Assessment: Osteopenia with low fall risk  supratherapeutic anticoagulation Possible UTI  Recommendations: 1.   Discussed BMD  / DEXA results and discussed fracture risk. 2.  continue calcium 1200mg  daily through supplementation or diet.  3.  continue weight bearing exercise - 30 minutes at least 4 days per week.   4.  Counseled and educated about fall risk and prevention. 5.   Anticoagulation Dose Instructions as of 12/02/2014      Dorene Grebe Tue Wed Thu Fri Sat   New Dose 5 mg 2.5 mg 5 mg 2.5 mg 5 mg 2.5 mg 5 mg    Description        Continue warfarin 5mg  1/2 tablet on mondays, wednesdays and fridays and 1 tablet all other days.  Recheck DEXA:  2 years  Time spent counseling patient:  40 minutes   Cherre Robins, PharmD, CPP

## 2014-12-03 DIAGNOSIS — M6289 Other specified disorders of muscle: Secondary | ICD-10-CM | POA: Diagnosis not present

## 2014-12-03 DIAGNOSIS — R269 Unspecified abnormalities of gait and mobility: Secondary | ICD-10-CM | POA: Diagnosis not present

## 2014-12-03 DIAGNOSIS — R278 Other lack of coordination: Secondary | ICD-10-CM | POA: Diagnosis not present

## 2014-12-03 DIAGNOSIS — R29898 Other symptoms and signs involving the musculoskeletal system: Secondary | ICD-10-CM | POA: Diagnosis not present

## 2014-12-03 DIAGNOSIS — I69359 Hemiplegia and hemiparesis following cerebral infarction affecting unspecified side: Secondary | ICD-10-CM | POA: Diagnosis not present

## 2014-12-03 DIAGNOSIS — I69351 Hemiplegia and hemiparesis following cerebral infarction affecting right dominant side: Secondary | ICD-10-CM | POA: Diagnosis not present

## 2014-12-03 LAB — FECAL OCCULT BLOOD, IMMUNOCHEMICAL: Fecal Occult Bld: POSITIVE — AB

## 2014-12-04 DIAGNOSIS — R0902 Hypoxemia: Secondary | ICD-10-CM | POA: Diagnosis not present

## 2014-12-04 DIAGNOSIS — J449 Chronic obstructive pulmonary disease, unspecified: Secondary | ICD-10-CM | POA: Diagnosis not present

## 2014-12-04 DIAGNOSIS — I519 Heart disease, unspecified: Secondary | ICD-10-CM | POA: Diagnosis not present

## 2014-12-04 LAB — MICROALBUMIN / CREATININE URINE RATIO
Creatinine, Urine: 36 mg/dL
MICROALB/CREAT RATIO: 17.5 mg/g{creat} (ref 0.0–30.0)
MICROALBUM., U, RANDOM: 6.3 ug/mL

## 2014-12-08 DIAGNOSIS — R269 Unspecified abnormalities of gait and mobility: Secondary | ICD-10-CM | POA: Diagnosis not present

## 2014-12-08 DIAGNOSIS — I69351 Hemiplegia and hemiparesis following cerebral infarction affecting right dominant side: Secondary | ICD-10-CM | POA: Diagnosis not present

## 2014-12-09 ENCOUNTER — Other Ambulatory Visit: Payer: Self-pay | Admitting: Pharmacist

## 2014-12-09 ENCOUNTER — Telehealth: Payer: Self-pay | Admitting: Pharmacist

## 2014-12-09 DIAGNOSIS — R195 Other fecal abnormalities: Secondary | ICD-10-CM

## 2014-12-09 MED ORDER — CIPROFLOXACIN HCL 500 MG PO TABS: 500.0000 mg | ORAL_TABLET | Freq: Two times a day (BID) | ORAL | Status: DC

## 2014-12-09 NOTE — Telephone Encounter (Signed)
Noted.  Recommended that patient try to come in tomorrow or Friday for labs and to pick up FOBT to repeat.

## 2014-12-10 ENCOUNTER — Other Ambulatory Visit: Payer: Self-pay | Admitting: *Deleted

## 2014-12-10 DIAGNOSIS — E11610 Type 2 diabetes mellitus with diabetic neuropathic arthropathy: Secondary | ICD-10-CM

## 2014-12-10 MED ORDER — HYDROCODONE-ACETAMINOPHEN 7.5-325 MG PO TABS: 1.0000 | ORAL_TABLET | Freq: Two times a day (BID) | ORAL | Status: DC | PRN

## 2014-12-10 NOTE — Telephone Encounter (Signed)
[facility] ready for pick up  

## 2014-12-10 NOTE — Telephone Encounter (Signed)
[facility]. Last filled 11/09/14, last seen 10/05/14. Will pick up tomorrow

## 2014-12-11 ENCOUNTER — Other Ambulatory Visit (INDEPENDENT_AMBULATORY_CARE_PROVIDER_SITE_OTHER): Payer: Medicare Other

## 2014-12-11 DIAGNOSIS — R195 Other fecal abnormalities: Secondary | ICD-10-CM | POA: Diagnosis not present

## 2014-12-11 NOTE — Progress Notes (Signed)
Lab only 

## 2014-12-12 DIAGNOSIS — J449 Chronic obstructive pulmonary disease, unspecified: Secondary | ICD-10-CM | POA: Diagnosis not present

## 2014-12-12 DIAGNOSIS — I519 Heart disease, unspecified: Secondary | ICD-10-CM | POA: Diagnosis not present

## 2014-12-12 DIAGNOSIS — R0902 Hypoxemia: Secondary | ICD-10-CM | POA: Diagnosis not present

## 2014-12-12 LAB — ANEMIA PROFILE B
BASOS ABS: 0 10*3/uL (ref 0.0–0.2)
BASOS: 1 %
EOS (ABSOLUTE): 0.2 10*3/uL (ref 0.0–0.4)
Eos: 5 %
FOLATE: 14 ng/mL (ref >3.0)
Ferritin: 473 ng/mL — ABNORMAL HIGH (ref 15–150)
Hematocrit: 32.3 % — ABNORMAL LOW (ref 34.0–46.6)
Hemoglobin: 10.3 g/dL — ABNORMAL LOW (ref 11.1–15.9)
IRON SATURATION: 15 % (ref 15–55)
IRON: 44 ug/dL (ref 27–139)
Immature Grans (Abs): 0 10*3/uL (ref 0.0–0.1)
Immature Granulocytes: 0 %
LYMPHS: 35 %
Lymphocytes Absolute: 1.8 10*3/uL (ref 0.7–3.1)
MCH: 28.3 pg (ref 26.6–33.0)
MCHC: 31.9 g/dL (ref 31.5–35.7)
MCV: 89 fL (ref 79–97)
Monocytes Absolute: 1.2 10*3/uL — ABNORMAL HIGH (ref 0.1–0.9)
Monocytes: 23 %
NEUTROS PCT: 36 %
Neutrophils Absolute: 1.8 10*3/uL (ref 1.4–7.0)
Platelets: 107 10*3/uL — ABNORMAL LOW (ref 150–379)
RBC: 3.64 x10E6/uL — ABNORMAL LOW (ref 3.77–5.28)
RDW: 16.2 % — AB (ref 12.3–15.4)
Retic Ct Pct: 2.1 % (ref 0.6–2.6)
Total Iron Binding Capacity: 300 ug/dL (ref 250–450)
UIBC: 256 ug/dL (ref 118–369)
VITAMIN B 12: 348 pg/mL (ref 211–946)
WBC: 5 10*3/uL (ref 3.4–10.8)

## 2014-12-14 ENCOUNTER — Telehealth: Payer: Self-pay | Admitting: Pharmacist

## 2014-12-14 ENCOUNTER — Encounter: Payer: Self-pay | Admitting: Internal Medicine

## 2014-12-14 ENCOUNTER — Inpatient Hospital Stay
Admission: RE | Admit: 2014-12-14 | Discharge: 2015-01-01 | Disposition: A | Discharge status: Nursing Home (Includes ICF) | Payer: Medicare Other | Source: Ambulatory Visit | Attending: Internal Medicine | Admitting: Internal Medicine

## 2014-12-14 ENCOUNTER — Non-Acute Institutional Stay (SKILLED_NURSING_FACILITY): Payer: Medicare Other | Admitting: Internal Medicine

## 2014-12-14 DIAGNOSIS — E559 Vitamin D deficiency, unspecified: Secondary | ICD-10-CM | POA: Diagnosis not present

## 2014-12-14 DIAGNOSIS — T45515A Adverse effect of anticoagulants, initial encounter: Secondary | ICD-10-CM | POA: Diagnosis not present

## 2014-12-14 DIAGNOSIS — K5900 Constipation, unspecified: Secondary | ICD-10-CM | POA: Diagnosis not present

## 2014-12-14 DIAGNOSIS — Z833 Family history of diabetes mellitus: Secondary | ICD-10-CM | POA: Diagnosis not present

## 2014-12-14 DIAGNOSIS — E876 Hypokalemia: Secondary | ICD-10-CM | POA: Diagnosis not present

## 2014-12-14 DIAGNOSIS — Z8673 Personal history of transient ischemic attack (TIA), and cerebral infarction without residual deficits: Secondary | ICD-10-CM | POA: Diagnosis not present

## 2014-12-14 DIAGNOSIS — R4182 Altered mental status, unspecified: Secondary | ICD-10-CM | POA: Diagnosis not present

## 2014-12-14 DIAGNOSIS — D500 Iron deficiency anemia secondary to blood loss (chronic): Secondary | ICD-10-CM | POA: Diagnosis not present

## 2014-12-14 DIAGNOSIS — R300 Dysuria: Secondary | ICD-10-CM | POA: Diagnosis not present

## 2014-12-14 DIAGNOSIS — M109 Gout, unspecified: Secondary | ICD-10-CM | POA: Diagnosis not present

## 2014-12-14 DIAGNOSIS — R269 Unspecified abnormalities of gait and mobility: Secondary | ICD-10-CM | POA: Diagnosis not present

## 2014-12-14 DIAGNOSIS — I509 Heart failure, unspecified: Secondary | ICD-10-CM | POA: Diagnosis not present

## 2014-12-14 DIAGNOSIS — I69391 Dysphagia following cerebral infarction: Secondary | ICD-10-CM | POA: Diagnosis not present

## 2014-12-14 DIAGNOSIS — G8191 Hemiplegia, unspecified affecting right dominant side: Secondary | ICD-10-CM | POA: Diagnosis not present

## 2014-12-14 DIAGNOSIS — Z7901 Long term (current) use of anticoagulants: Secondary | ICD-10-CM | POA: Diagnosis not present

## 2014-12-14 DIAGNOSIS — K921 Melena: Secondary | ICD-10-CM | POA: Diagnosis not present

## 2014-12-14 DIAGNOSIS — M6281 Muscle weakness (generalized): Secondary | ICD-10-CM | POA: Diagnosis not present

## 2014-12-14 DIAGNOSIS — Z803 Family history of malignant neoplasm of breast: Secondary | ICD-10-CM | POA: Diagnosis not present

## 2014-12-14 DIAGNOSIS — D6489 Other specified anemias: Secondary | ICD-10-CM | POA: Diagnosis not present

## 2014-12-14 DIAGNOSIS — Z6837 Body mass index (BMI) 37.0-37.9, adult: Secondary | ICD-10-CM | POA: Diagnosis not present

## 2014-12-14 DIAGNOSIS — M47812 Spondylosis without myelopathy or radiculopathy, cervical region: Secondary | ICD-10-CM | POA: Diagnosis not present

## 2014-12-14 DIAGNOSIS — D649 Anemia, unspecified: Secondary | ICD-10-CM | POA: Diagnosis not present

## 2014-12-14 DIAGNOSIS — Z79891 Long term (current) use of opiate analgesic: Secondary | ICD-10-CM | POA: Diagnosis not present

## 2014-12-14 DIAGNOSIS — D696 Thrombocytopenia, unspecified: Secondary | ICD-10-CM | POA: Diagnosis not present

## 2014-12-14 DIAGNOSIS — R791 Abnormal coagulation profile: Secondary | ICD-10-CM | POA: Diagnosis not present

## 2014-12-14 DIAGNOSIS — Z8249 Family history of ischemic heart disease and other diseases of the circulatory system: Secondary | ICD-10-CM | POA: Diagnosis not present

## 2014-12-14 DIAGNOSIS — I69359 Hemiplegia and hemiparesis following cerebral infarction affecting unspecified side: Secondary | ICD-10-CM | POA: Diagnosis not present

## 2014-12-14 DIAGNOSIS — F329 Major depressive disorder, single episode, unspecified: Secondary | ICD-10-CM | POA: Diagnosis not present

## 2014-12-14 DIAGNOSIS — D638 Anemia in other chronic diseases classified elsewhere: Secondary | ICD-10-CM | POA: Diagnosis not present

## 2014-12-14 DIAGNOSIS — D699 Hemorrhagic condition, unspecified: Secondary | ICD-10-CM | POA: Diagnosis not present

## 2014-12-14 DIAGNOSIS — R400 Somnolence: Secondary | ICD-10-CM | POA: Diagnosis not present

## 2014-12-14 DIAGNOSIS — R509 Fever, unspecified: Secondary | ICD-10-CM | POA: Diagnosis not present

## 2014-12-14 DIAGNOSIS — R5383 Other fatigue: Secondary | ICD-10-CM | POA: Diagnosis not present

## 2014-12-14 DIAGNOSIS — N289 Disorder of kidney and ureter, unspecified: Secondary | ICD-10-CM | POA: Diagnosis not present

## 2014-12-14 DIAGNOSIS — R32 Unspecified urinary incontinence: Secondary | ICD-10-CM | POA: Diagnosis not present

## 2014-12-14 DIAGNOSIS — E119 Type 2 diabetes mellitus without complications: Secondary | ICD-10-CM | POA: Diagnosis not present

## 2014-12-14 DIAGNOSIS — R262 Difficulty in walking, not elsewhere classified: Secondary | ICD-10-CM | POA: Diagnosis not present

## 2014-12-14 DIAGNOSIS — G8190 Hemiplegia, unspecified affecting unspecified side: Secondary | ICD-10-CM | POA: Diagnosis not present

## 2014-12-14 DIAGNOSIS — E1140 Type 2 diabetes mellitus with diabetic neuropathy, unspecified: Secondary | ICD-10-CM

## 2014-12-14 DIAGNOSIS — J449 Chronic obstructive pulmonary disease, unspecified: Secondary | ICD-10-CM | POA: Diagnosis not present

## 2014-12-14 DIAGNOSIS — E785 Hyperlipidemia, unspecified: Secondary | ICD-10-CM | POA: Diagnosis not present

## 2014-12-14 DIAGNOSIS — G9340 Encephalopathy, unspecified: Secondary | ICD-10-CM | POA: Diagnosis not present

## 2014-12-14 DIAGNOSIS — I959 Hypotension, unspecified: Secondary | ICD-10-CM | POA: Diagnosis not present

## 2014-12-14 DIAGNOSIS — K649 Unspecified hemorrhoids: Secondary | ICD-10-CM | POA: Diagnosis not present

## 2014-12-14 DIAGNOSIS — R278 Other lack of coordination: Secondary | ICD-10-CM | POA: Diagnosis not present

## 2014-12-14 DIAGNOSIS — R40241 Glasgow coma scale score 13-15: Secondary | ICD-10-CM | POA: Diagnosis not present

## 2014-12-14 DIAGNOSIS — N179 Acute kidney failure, unspecified: Secondary | ICD-10-CM | POA: Diagnosis not present

## 2014-12-14 DIAGNOSIS — E11610 Type 2 diabetes mellitus with diabetic neuropathic arthropathy: Secondary | ICD-10-CM | POA: Diagnosis not present

## 2014-12-14 DIAGNOSIS — I10 Essential (primary) hypertension: Secondary | ICD-10-CM | POA: Diagnosis not present

## 2014-12-14 DIAGNOSIS — E860 Dehydration: Secondary | ICD-10-CM | POA: Diagnosis not present

## 2014-12-14 NOTE — Telephone Encounter (Signed)
Patient positive FOBT recently.  She came in 12/11/2014 with another sample and also has anemia profile checked.  Anemia profile showed low hemoglobin and HCT.  Low platelets and RBC. Patient was scheduled to so to rehab facility today.  Need to make rehab facility aware of results and that this needs to be monitored closely.  Tried to call patient's daughter - left message on VM.

## 2014-12-14 NOTE — Telephone Encounter (Signed)
Verified that patient is at [facility] for rehab.   Discussed lab results with daughter.   Will discuss with her PCP and also communicate with [facility] about recheck CBC .

## 2014-12-15 ENCOUNTER — Non-Acute Institutional Stay (SKILLED_NURSING_FACILITY): Payer: Medicare Other | Admitting: Internal Medicine

## 2014-12-15 ENCOUNTER — Encounter (HOSPITAL_COMMUNITY)
Admission: AD | Admit: 2014-12-15 | Discharge: 2014-12-15 | Disposition: A | Discharge status: Home / Self Care | Payer: Medicare Other | Source: Skilled Nursing Facility | Attending: Internal Medicine | Admitting: Internal Medicine

## 2014-12-15 DIAGNOSIS — R300 Dysuria: Secondary | ICD-10-CM

## 2014-12-15 DIAGNOSIS — D649 Anemia, unspecified: Secondary | ICD-10-CM

## 2014-12-15 DIAGNOSIS — Z8673 Personal history of transient ischemic attack (TIA), and cerebral infarction without residual deficits: Secondary | ICD-10-CM | POA: Diagnosis not present

## 2014-12-15 DIAGNOSIS — Z7901 Long term (current) use of anticoagulants: Secondary | ICD-10-CM | POA: Diagnosis not present

## 2014-12-15 LAB — BASIC METABOLIC PANEL
Anion gap: 9 (ref 5–15)
BUN: 11 mg/dL (ref 6–20)
CALCIUM: 9.3 mg/dL (ref 8.9–10.3)
CO2: 28 mmol/L (ref 22–32)
CREATININE: 0.55 mg/dL (ref 0.44–1.00)
Chloride: 104 mmol/L (ref 101–111)
GFR calc Af Amer: >60 mL/min (ref >60)
GFR calc non Af Amer: >60 mL/min (ref >60)
GLUCOSE: 102 mg/dL — AB (ref 65–99)
POTASSIUM: 3.9 mmol/L (ref 3.5–5.1)
Sodium: 141 mmol/L (ref 135–145)

## 2014-12-15 LAB — URINALYSIS, ROUTINE W REFLEX MICROSCOPIC
Bilirubin Urine: NEGATIVE
Glucose, UA: NEGATIVE mg/dL
Hgb urine dipstick: NEGATIVE
KETONES UR: NEGATIVE mg/dL
LEUKOCYTES UA: NEGATIVE
Nitrite: NEGATIVE
PH: 6 (ref 5.0–8.0)
PROTEIN: NEGATIVE mg/dL
Specific Gravity, Urine: <1.005 — ABNORMAL LOW (ref 1.005–1.030)
UROBILINOGEN UA: 0.2 mg/dL (ref 0.0–1.0)

## 2014-12-15 LAB — PROTIME-INR
INR: 3.86 — ABNORMAL HIGH (ref 0.00–1.49)
PROTHROMBIN TIME: 37 s — AB (ref 11.6–15.2)

## 2014-12-15 NOTE — Progress Notes (Signed)
Patient ID: Brittany Clarke, female   DOB: 1940-07-30, 74 y.o.   MRN: [id number]     This is an acute visit.  Level care skilled.  Facility [facility].  Chief complaint acute visit secondary to supratherapeutic INR on chronic Coumadin with history of CVA-follow-up anemia thrombocytopenia.  Marland Kitchen  History of present illness.  Patient is a pleasant 74 year old female who came from home appear she is here for rehabilitation and strengthening.  She is on chronic Coumadin it appears for history CVA with right-sided weakness.  INR today is elevated at 3.86-there is been no increased bruising or bleeding she is on 5 mg.  Patient also recently saw her primary care provider and anemia studies were ordered apparently secondary to an occult positive stool.  Anemia panel showed a whole low hemoglobin low platelets and RBC-updated lab on July 22 showed hemoglobin of 10.3 which appears to be relatively stable platelets of 107,000 this will have to be monitored.  Total iron-binding capacity was 300 iron 44 ferritin 473 B12 348 and folate 14.  Clinically she appears stable does not complain of any increased dizziness headache or syncopal-type feelings.  Family medical social history i been reviewed including primary care note in May 2015.  Medications have been reviewed.  They include.  Allopurinol 300 mg daily.  Atorvastatin 40 mg daily.  Lasix 20 mg every other day.  Neurontin 100 mg twice a day and 400 mg daily at bedtime.  Vicodin 7.5-325 mg twice a day when necessary.  Claritin 10 mg daily.  Cozaar 25 mg daily.  Glucophage 500 mg twice a day.  Lopressor 12.5 mg twice a day.  Singulair 10 mg daily.  Coumadin 5 mg daily.  Review of systems.  In general does not complain of any fever chills.  Skin does not complain of rashes itching or increased bruising.  Head ears eyes nose mouth and throat does not complain of any sore throat or nasal discharge.  Respiratory does  not complain of cough or shortness of breath.  GI does not complain of any abdominal discomfort nausea or vomiting constipation or diarrhea.  GU was recently treated for UTI still complains of small amount of dysuria.  Muscle skeletal does not complain of joint pain does have right-sided weakness again is receiving therapy.  Neurologic again history CVA with right-sided weakness does not complain of dizziness headache or syncopal-type feelings.  Psych does not complain of depression or anxiety.  Physical exam.  Temperature is 99.5 pulse 90 respirations 18 blood per 127/72.  In general this is a pleasant elderly female in no distress resting comfortably in bed.  Her skin is warm and dry I do not note any increased bruising or bleeding.  Eyes pupils appear reactive to light visual acuity appears intact sclera and conjunctiva are clear.  Oropharynx clear mucous membranes moist.  Chest is clear to auscultation there is no labored breathing.  Heart is regular rate and rhythm without murmur gallop or rub she does have 2+ edema/venous stasis changes bilaterally lower extremities.  Abdomen soft obese soft nontender with positive bowel sounds.  GU exam was unremarkable there is no drainage bleeding or discharge could not really appreciate significant suprapubic tenderness question mild CV tenderness  Muscle skeletal does move all studies 4 with right-sided weakness compared to the left grip strength is reduced on the right.  Neurologic as noted above her cranial nerves are intact speech is clear.  Psych she is alert and oriented pleasant and appropriate.  Labs.  12/15/2014.  Sodium 141 potassium 3.9 BUN 11 creatinine 0.55.  INR 3.86.  I note it was 3.1 on July 15 and 2.5 one month ago.  12/11/2014.  TIBC 300-iron 44-ferritin 473-B12 348-folate 14-WBC 5.0 hemoglobin 10.3 platelets 107,000.  Assessment and plan.  #1-history CVA on chronic anticoagulation-with Coumadin-INR  is elevated today Will hold Coumadin tonight and recheck tomorrow clinically she appears to be stable-she does continue with residual right-sided weakness.  #2-history of anemia apparently with one occult positive stool-this will need close monitoring hemoglobin 10.3 on July 22 we will recheck this clinically she appears to be stable she may need a GI consult I colonoscopy at some point here.--Also consider proton pump inhibitor-her hemoglobin appears to be stable.  #3-possible dysuria with history of UTI-again will check a CBC with differential as well as a UA CandS I do notet she has a low-grade temperature at times  [id number]

## 2014-12-16 ENCOUNTER — Encounter: Payer: Self-pay | Admitting: Internal Medicine

## 2014-12-16 ENCOUNTER — Non-Acute Institutional Stay (SKILLED_NURSING_FACILITY): Payer: Medicare Other | Admitting: Internal Medicine

## 2014-12-16 DIAGNOSIS — Z7901 Long term (current) use of anticoagulants: Secondary | ICD-10-CM | POA: Diagnosis not present

## 2014-12-16 DIAGNOSIS — D649 Anemia, unspecified: Secondary | ICD-10-CM

## 2014-12-16 DIAGNOSIS — K5900 Constipation, unspecified: Secondary | ICD-10-CM

## 2014-12-16 LAB — PROTIME-INR
INR: 2.81 — AB (ref 0.00–1.49)
PROTHROMBIN TIME: 29.2 s — AB (ref 11.6–15.2)

## 2014-12-16 LAB — CBC WITH DIFFERENTIAL/PLATELET
BASOS ABS: 0 10*3/uL (ref 0.0–0.1)
Basophils Relative: 1 % (ref 0–1)
EOS ABS: 0.2 10*3/uL (ref 0.0–0.7)
Eosinophils Relative: 3 % (ref 0–5)
HEMATOCRIT: 33.2 % — AB (ref 36.0–46.0)
Hemoglobin: 10.6 g/dL — ABNORMAL LOW (ref 12.0–15.0)
Lymphocytes Relative: 38 % (ref 12–46)
Lymphs Abs: 2 10*3/uL (ref 0.7–4.0)
MCH: 27.7 pg (ref 26.0–34.0)
MCHC: 31.9 g/dL (ref 30.0–36.0)
MCV: 86.7 fL (ref 78.0–100.0)
MONOS PCT: 20 % — AB (ref 3–12)
Monocytes Absolute: 1.1 10*3/uL — ABNORMAL HIGH (ref 0.1–1.0)
Neutro Abs: 2.1 10*3/uL (ref 1.7–7.7)
Neutrophils Relative %: 39 % — ABNORMAL LOW (ref 43–77)
Platelets: 126 10*3/uL — ABNORMAL LOW (ref 150–400)
RBC: 3.83 MIL/uL — ABNORMAL LOW (ref 3.87–5.11)
RDW: 16 % — AB (ref 11.5–15.5)
WBC: 5.3 10*3/uL (ref 4.0–10.5)

## 2014-12-16 LAB — HEMOGLOBIN A1C
HEMOGLOBIN A1C: 6.3 % — AB (ref 4.8–5.6)
Mean Plasma Glucose: 134 mg/dL

## 2014-12-16 LAB — VITAMIN D 25 HYDROXY (VIT D DEFICIENCY, FRACTURES): Vit D, 25-Hydroxy: 64.3 ng/mL (ref 30.0–100.0)

## 2014-12-16 NOTE — Progress Notes (Signed)
Patient ID: Brittany Clarke, female   DOB: 06-13-1940, 74 y.o.   MRN: [id number]   This is an acute visit.  Level care skilled.  Facility [facility].  Chief complaint-acute visit follow-up anemia-anticoagulation management on Coumadin with history CVA.  History of present illness.  Patient is a pleasant 74 year old female recently admitted here from home apparently secondary to weakness.  She also has a history of anemia apparently with a history of an occult positive stool-this was available weight of by her primary care provider before admission here.  Iron studies showed iron level XLIV total iron-binding capacity 300 B12 was 348 folate was 14 ferritin was elevated at 473.  Hemoglobin apparently a primary care doctor was 10.3 on lab done today this has improved somewhat to 10.6 this is normocytic with an MCV of 86.7 platelets are somewhat low at 126 this actually is slightly improved from the lab at her primary care provider  was 107.  Her INR today is 2.81 this is down from 3.86 yesterday-it was held yesterday secondary to being supratherapeutic there's been no increased bruising or bleeding.  Her main complaint today is hemorrhoid pain she would like something for that.  She also states she has not had a bowel movement in several days she does not complaining of any abdominal discomfort of significance or nausea or vomiting  Family medical social history as been reviewed per primary care provider note on 09/22/2014.  Medications have been reviewed per MAR.  They include.  Allopurinol 300 mg daily.  Atorvastatin 40 mg daily.  Baclofen 5 mg 3 times a day.  Claritin 10 mg daily.  Coumadin was held yesterday we will restart this at 4 mg a day.  Cozaar 25 mg daily.  Vicodin 7. 09/22/2023 milligrams twice a day when necessary.  Lasix 20 mg every other day.  Glucophage 5 mg twice a day.  Metoprolol 12.5 mg twice a day.  Neurontin 100 mg twice a day and 400 mg daily  at bedtime.  Singulair  10 mg daily.  Review of systems.  In general does not complain of any fever or chills.  Skin does not complain of any diaphoresis itching or rash.  Eyes does not complain of visual changes.  Ear nose mouth and throat does not complain of sore throat or nasal discharge.  Cardiac no chest pain.  Respirate does not complain of cough or increased shortness of breath.  GI does not complain of abdominal discomfort does state she hasn't had a bowel movement in several days known vomiting or diarrhea noted.  GU has complained at times of some dysuria a urine culture is been obtained so far urinalysis appears fairly unremarkable. Her skin is warm and dry.  Chest is clear to auscultation there is no labored breathing.  Heart is regular rate and rhythm without murmur gallop or rub.  Her abdomen is obese soft nontender with slightly hypoactive bowel sounds this could be due to obesity.  Muscle skeletal has right-sided weakness with some contracture of her right hand this apparently is baseline left extremity strength appears intact she is able to move certainly her right extremities but again has weakness.  Neurologic as stated above her cranial nerves are intact speech is clear.  Psych she is alert and oriented pleasant and appropriate.   Musculoskeletal has right-sided weakness status post CVA does not complain of joint pain currently.  Neurologic again history of right-sided weakness status post CVA her speech is clear does not complain of any  headache dizziness syncopal-type feelings.  Psych is not complaining of depression or anxiety currently.  Physical exam.  Temperature is 98.9 pulse 84 respirations 18 blood pressure 126/63.  In general this is a pleasant obese elderly female in no distress sitting comfortably in bed.          Labs.  12/16/2014.  WBC 5.3 hemoglobin 10.6 platelets 126.   12/15/2014.  Sodium 141 potassium 3.9 BUN 11  creatinine 0.55.    INR 3.86  12/11/2014.  WBC 5.0 hemoglobin 10.3 platelets 107.  Total iron-binding capacity 300--iron 44--ferritin 473--B12 348--folate 14--  Assessment and plan.   #1-anemia-this was discussed with Dr. Tommie Raymond is some suspicion there may be an element of myelodysplastic syndrome-at this point will monitor and update a CBC with plateletsin 2 days-platelet count actually has improved on most recent lab hemoglobin is slightly improved as well at 10.6 clinically she appears to be stable.  Also will write an order to guaiac stools 3 she apparently does have some history of occult positive bleeding-consider a GI consult.  #2-chronic anticoagulation on Coumadin with history CVA-INR was supratherapeutic and is now on therapeutic range will restart Coumadin at 4 mg a day and recheck this in 2 days  #3-history of constipation?-Will order an abdominal x-ray-also suspect she will need a motility agent pending findings of x-ray clinically appears to be stable.  #4 hemorrhoid discomfort she says this is chronic secondary to patient positioning in wheelchair could not really evaluate that today will write an order for when necessary Anusol--again she says this is not new and chronic discomfort.  #5 dysuria-she is not running a fever today-urine culture is pending  [id number]

## 2014-12-17 ENCOUNTER — Ambulatory Visit (HOSPITAL_COMMUNITY): Payer: Medicare Other | Attending: Internal Medicine

## 2014-12-17 DIAGNOSIS — K5900 Constipation, unspecified: Secondary | ICD-10-CM | POA: Insufficient documentation

## 2014-12-17 LAB — URINE CULTURE

## 2014-12-18 LAB — CBC WITH DIFFERENTIAL/PLATELET
Basophils Absolute: 0 10*3/uL (ref 0.0–0.1)
Basophils Relative: 1 % (ref 0–1)
Eosinophils Absolute: 0.1 10*3/uL (ref 0.0–0.7)
Eosinophils Relative: 3 % (ref 0–5)
HEMATOCRIT: 35 % — AB (ref 36.0–46.0)
Hemoglobin: 11.4 g/dL — ABNORMAL LOW (ref 12.0–15.0)
Lymphocytes Relative: 35 % (ref 12–46)
Lymphs Abs: 1.9 10*3/uL (ref 0.7–4.0)
MCH: 28.4 pg (ref 26.0–34.0)
MCHC: 32.6 g/dL (ref 30.0–36.0)
MCV: 87.1 fL (ref 78.0–100.0)
Monocytes Absolute: 0.8 10*3/uL (ref 0.1–1.0)
Monocytes Relative: 15 % — ABNORMAL HIGH (ref 3–12)
NEUTROS ABS: 2.5 10*3/uL (ref 1.7–7.7)
NEUTROS PCT: 46 % (ref 43–77)
PLATELETS: 137 10*3/uL — AB (ref 150–400)
RBC: 4.02 MIL/uL (ref 3.87–5.11)
RDW: 16.2 % — AB (ref 11.5–15.5)
WBC: 5.5 10*3/uL (ref 4.0–10.5)

## 2014-12-18 LAB — PROTIME-INR
INR: 2.2 — ABNORMAL HIGH (ref 0.00–1.49)
Prothrombin Time: 24.2 seconds — ABNORMAL HIGH (ref 11.6–15.2)

## 2014-12-19 ENCOUNTER — Encounter: Payer: Self-pay | Admitting: Internal Medicine

## 2014-12-21 ENCOUNTER — Encounter (HOSPITAL_COMMUNITY)
Admission: AD | Admit: 2014-12-21 | Discharge: 2014-12-21 | Disposition: A | Discharge status: Home / Self Care | Payer: Medicare Other | Source: Skilled Nursing Facility | Attending: Internal Medicine | Admitting: Internal Medicine

## 2014-12-21 DIAGNOSIS — Z9289 Personal history of other medical treatment: Secondary | ICD-10-CM

## 2014-12-21 HISTORY — DX: Personal history of other medical treatment: Z92.89

## 2014-12-21 LAB — CBC WITH DIFFERENTIAL/PLATELET
Basophils Absolute: 0.1 10*3/uL (ref 0.0–0.1)
Basophils Relative: 1 % (ref 0–1)
Eosinophils Absolute: 0.1 10*3/uL (ref 0.0–0.7)
Eosinophils Relative: 4 % (ref 0–5)
HCT: 30 % — ABNORMAL LOW (ref 36.0–46.0)
Hemoglobin: 9.6 g/dL — ABNORMAL LOW (ref 12.0–15.0)
LYMPHS ABS: 1.3 10*3/uL (ref 0.7–4.0)
Lymphocytes Relative: 34 % (ref 12–46)
MCH: 28.2 pg (ref 26.0–34.0)
MCHC: 32 g/dL (ref 30.0–36.0)
MCV: 88 fL (ref 78.0–100.0)
MONO ABS: 0.9 10*3/uL (ref 0.1–1.0)
Monocytes Relative: 22 % — ABNORMAL HIGH (ref 3–12)
NEUTROS ABS: 1.6 10*3/uL — AB (ref 1.7–7.7)
Neutrophils Relative %: 39 % — ABNORMAL LOW (ref 43–77)
PLATELETS: 120 10*3/uL — AB (ref 150–400)
RBC: 3.41 MIL/uL — ABNORMAL LOW (ref 3.87–5.11)
RDW: 16.3 % — ABNORMAL HIGH (ref 11.5–15.5)
WBC: 4 10*3/uL (ref 4.0–10.5)

## 2014-12-21 LAB — BASIC METABOLIC PANEL
ANION GAP: 6 (ref 5–15)
BUN: 13 mg/dL (ref 6–20)
CHLORIDE: 103 mmol/L (ref 101–111)
CO2: 29 mmol/L (ref 22–32)
Calcium: 9.4 mg/dL (ref 8.9–10.3)
Creatinine, Ser: 0.56 mg/dL (ref 0.44–1.00)
GFR calc Af Amer: >60 mL/min (ref >60)
GLUCOSE: 108 mg/dL — AB (ref 65–99)
Potassium: 4.1 mmol/L (ref 3.5–5.1)
Sodium: 138 mmol/L (ref 135–145)

## 2014-12-21 LAB — PROTIME-INR
INR: 2.69 — AB (ref 0.00–1.49)
PROTHROMBIN TIME: 28.2 s — AB (ref 11.6–15.2)

## 2014-12-22 NOTE — Progress Notes (Addendum)
Patient ID: Brittany Clarke, female   DOB: 15-Oct-1940, 74 y.o.   MRN: [id number]                HISTORY & PHYSICAL  DATE:  12/14/2014         FACILITY: [facility]                LEVEL OF CARE:   SNF   CHIEF COMPLAINT:  Admission to the facility from home.  She is here for further rehabilitation   HISTORY OF PRESENT ILLNESS:  This patient apparently suffered a dominant hemisphere stroke in 2013.  She was hospitalized at [facility] and then went to [place], then went to a nursing home in [facility], and then came home.   At the best of times, she was able to walk with a quad cane in her left hand.    It would appear that, for most of the year, she has deteriorated.  She has been going to outpatient rehab with increasing difficulty walking.  It is difficult to get a sense of what has been most progressive here.  She had some pain in the neck and I see that she had a CT scan of her neck on 11/12/2014 that showed degenerative changes from C2-C3, C3-C4, facet hypertrophy at C4-C5, osteophyte complex at C5-C6.  She was also noted to have a right thyroid nodule that was calcified.    Apparently, the patient needs assistance to walk from her bed to the bathroom.  Both of her daughters who are present state that her gait is slowly declining, although she was able to get out of the car today and transfer with minimal assistance into the wheelchair.   It is also clear that there is probably a component of disuse and the patient really does not do a lot of walking outside of when she is in therapy.    PAST MEDICAL HISTORY/PROBLEM LIST:                      Congestive heart failure.    Type 2 diabetes.  On oral agents.    COPD.    Hypertension.    Asthma.    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    History of anxiety.    History of pneumonia.     History of gout.     History of "cancer".     Long-term use of Coumadin.    PAST SURGICAL HISTORY:               Tubal ligation.    Colonoscopy.    EGD in 2014.     Breast surgery on the left in 2005.     CURRENT MEDICATIONS:  Medication list is reviewed.                     Tylenol 500 q.6 hours p.r.n.       Allopurinol 300 q.d.     Lipitor 40 q.d.      Baclofen 5 mg three times daily.      Lasix 20 mg every other day.     Neurontin 100 b.i.d. and 400 in the evening.    Norco 7.5/325 b.i.d.      Claritin 10 q.d.      Losartan 25 q.d.       Metformin 500 b.i.d.      Lopressor 25,  tablet b.i.d.    Singulair 10 q.d.  Coumadin 5 mg daily.      SOCIAL HISTORY:                   HOUSING:  The patient lives in her own apartment in [place].   FUNCTIONAL STATUS:  She has care in the home.  She has family and then somebody is there at night.  She has people in the environment for most of the day, but not all of the day.  She sleeps in a lift chair.  I am not exactly sure of the last time she walked with a quad cane.  She was in rehab until May of this year.    FAMILY HISTORY:   Positive for:    Hypertension.    Diabetes.      REVIEW OF SYSTEMS:    HEENT: no visual compliants         CHEST/RESPIRATORY:  No shortness of breath.     CARDIAC:  No chest pain.    GI:  No abdominal pain.   GU: no dysuria or hematuria MUSCULOSKELETAL:   She does not have complaints of pain in her low back, but does have neck pain. Neurologic no focal weakness or change in sensation      PHYSICAL EXAMINATION:   GENERAL APPEARANCE:  This is a morbidly obese woman in no distress.  HEENT: no oral lesions seen   CHEST/RESPIRATORY:  Clear air entry bilaterally.     CARDIOVASCULAR:   CARDIAC:  Heart sounds are normal.  There is no S3.  No signs of heart failure.     GASTROINTESTINAL:   ABDOMEN:  No masses.     LIVER/SPLEEN/KIDNEY:   No liver, no spleen.  No tenderness.    GENITOURINARY:   BLADDER:  Not distended.   NEUROLOGICAL:   CRANIAL NERVES:  She may have bi-temporal visual field  loss.  Otherwise, her cranial nerves are normal.     SENSATION/STRENGTH:  She has proximal antigravity strength in the shoulder and the hip, but markedly weak on the right side.   DEEP TENDON REFLEXES:  She has clonus at the ankle jerk and an upgoing toe on the right.   Mental status: I see no problems here.. Able to give her own history. Affect is appropriate.  Gait: I did not attempt to test her today. 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  I think most of this issue here is the late effect of her CVA, disuse, plus problems related to obesity.  She does have cervical spondylosis, although I do not see any problems related to that in her upper extremities  or her left leg.    Cervical spondylosis.  As above.  I don't believe this is playing a major neurologic role  Type 2 diabetes with neuropathy.    She has bilateral Charcot feet.  No real macrovascular disease.    Anemia.  Recent lab work in [facility] showed a hemoglobin of 10.3, normochromic, normocytic.  Her platelet count was low at 107.  Her iron panel was essentially normal.  Her folate and B12 are also normal.  A combination of anemia and thrombocytopenia might suggest myelodysplastic syndrome.  At some point if she is here, I will repeat this.    Long-term use of Coumadin.    I am not completely certain what justifies this, although she has been on this for a  long period of time.    Rehabilitation: I did discuss issues with our rehab department. It would appear she has already been in rehab most of the year. I look forward to seeing what she is capable of doing when she is up

## 2014-12-23 LAB — CBC
HCT: 32.6 % — ABNORMAL LOW (ref 36.0–46.0)
Hemoglobin: 10.2 g/dL — ABNORMAL LOW (ref 12.0–15.0)
MCH: 27.8 pg (ref 26.0–34.0)
MCHC: 31.3 g/dL (ref 30.0–36.0)
MCV: 88.8 fL (ref 78.0–100.0)
Platelets: 93 10*3/uL — ABNORMAL LOW (ref 150–400)
RBC: 3.67 MIL/uL — ABNORMAL LOW (ref 3.87–5.11)
RDW: 16.5 % — ABNORMAL HIGH (ref 11.5–15.5)
WBC: 4.8 10*3/uL (ref 4.0–10.5)

## 2014-12-23 LAB — PROTIME-INR
INR: 2.56 — AB (ref 0.00–1.49)
Prothrombin Time: 27.2 seconds — ABNORMAL HIGH (ref 11.6–15.2)

## 2014-12-23 LAB — APTT: aPTT: 36 seconds (ref 24–37)

## 2014-12-24 ENCOUNTER — Ambulatory Visit: Payer: Medicare Other | Admitting: Family Medicine

## 2014-12-24 DIAGNOSIS — E119 Type 2 diabetes mellitus without complications: Secondary | ICD-10-CM | POA: Diagnosis not present

## 2014-12-24 DIAGNOSIS — D649 Anemia, unspecified: Secondary | ICD-10-CM | POA: Diagnosis not present

## 2014-12-24 DIAGNOSIS — R32 Unspecified urinary incontinence: Secondary | ICD-10-CM | POA: Diagnosis not present

## 2014-12-24 DIAGNOSIS — J449 Chronic obstructive pulmonary disease, unspecified: Secondary | ICD-10-CM | POA: Diagnosis not present

## 2014-12-25 LAB — PROTIME-INR
INR: 2.16 — ABNORMAL HIGH (ref 0.00–1.49)
Prothrombin Time: 23.9 seconds — ABNORMAL HIGH (ref 11.6–15.2)

## 2014-12-28 ENCOUNTER — Other Ambulatory Visit: Payer: Self-pay | Admitting: *Deleted

## 2014-12-28 ENCOUNTER — Non-Acute Institutional Stay (SKILLED_NURSING_FACILITY): Payer: Medicare Other | Admitting: Internal Medicine

## 2014-12-28 DIAGNOSIS — R791 Abnormal coagulation profile: Secondary | ICD-10-CM | POA: Diagnosis not present

## 2014-12-28 DIAGNOSIS — Z7901 Long term (current) use of anticoagulants: Secondary | ICD-10-CM | POA: Diagnosis not present

## 2014-12-28 DIAGNOSIS — Z8673 Personal history of transient ischemic attack (TIA), and cerebral infarction without residual deficits: Secondary | ICD-10-CM

## 2014-12-28 DIAGNOSIS — D696 Thrombocytopenia, unspecified: Secondary | ICD-10-CM | POA: Diagnosis not present

## 2014-12-28 DIAGNOSIS — E11610 Type 2 diabetes mellitus with diabetic neuropathic arthropathy: Secondary | ICD-10-CM

## 2014-12-28 LAB — CBC WITH DIFFERENTIAL/PLATELET
Basophils Absolute: 0.1 10*3/uL (ref 0.0–0.1)
Basophils Relative: 1 % (ref 0–1)
Eosinophils Absolute: 0.1 10*3/uL (ref 0.0–0.7)
Eosinophils Relative: 2 % (ref 0–5)
HEMATOCRIT: 30.5 % — AB (ref 36.0–46.0)
HEMOGLOBIN: 9.9 g/dL — AB (ref 12.0–15.0)
LYMPHS ABS: 1.6 10*3/uL (ref 0.7–4.0)
LYMPHS PCT: 31 % (ref 12–46)
MCH: 27.4 pg (ref 26.0–34.0)
MCHC: 32.5 g/dL (ref 30.0–36.0)
MCV: 84.5 fL (ref 78.0–100.0)
Monocytes Absolute: 1.4 10*3/uL — ABNORMAL HIGH (ref 0.1–1.0)
Monocytes Relative: 27 % — ABNORMAL HIGH (ref 3–12)
NEUTROS ABS: 2.1 10*3/uL (ref 1.7–7.7)
Neutrophils Relative %: 39 % — ABNORMAL LOW (ref 43–77)
Platelets: 75 10*3/uL — ABNORMAL LOW (ref 150–400)
RBC: 3.61 MIL/uL — ABNORMAL LOW (ref 3.87–5.11)
RDW: 16.7 % — AB (ref 11.5–15.5)
WBC: 5.3 10*3/uL (ref 4.0–10.5)

## 2014-12-28 LAB — COMPREHENSIVE METABOLIC PANEL
ALT: 13 U/L — AB (ref 14–54)
ANION GAP: 10 (ref 5–15)
AST: 31 U/L (ref 15–41)
Albumin: 3.2 g/dL — ABNORMAL LOW (ref 3.5–5.0)
Alkaline Phosphatase: 72 U/L (ref 38–126)
BILIRUBIN TOTAL: 1.3 mg/dL — AB (ref 0.3–1.2)
BUN: 27 mg/dL — AB (ref 6–20)
CHLORIDE: 103 mmol/L (ref 101–111)
CO2: 25 mmol/L (ref 22–32)
Calcium: 9.7 mg/dL (ref 8.9–10.3)
Creatinine, Ser: 0.82 mg/dL (ref 0.44–1.00)
GFR calc Af Amer: >60 mL/min (ref >60)
GFR calc non Af Amer: >60 mL/min (ref >60)
GLUCOSE: 84 mg/dL (ref 65–99)
Potassium: 4.2 mmol/L (ref 3.5–5.1)
Sodium: 138 mmol/L (ref 135–145)
TOTAL PROTEIN: 6.1 g/dL — AB (ref 6.5–8.1)

## 2014-12-28 LAB — PROTIME-INR
INR: 6.84 (ref 0.00–1.49)
INR: 7.12 — AB (ref 0.00–1.49)
PROTHROMBIN TIME: 58.6 s — AB (ref 11.6–15.2)
Prothrombin Time: 56.9 seconds — ABNORMAL HIGH (ref 11.6–15.2)

## 2014-12-28 MED ORDER — HYDROCODONE-ACETAMINOPHEN 7.5-325 MG PO TABS: ORAL_TABLET | ORAL | Status: DC

## 2014-12-28 NOTE — Progress Notes (Signed)
Patient ID: Brittany Clarke, female   DOB: Jul 05, 1940, 74 y.o.   MRN: [id number]     This is an acute visit.  Level care skilled.  Facility [facility].  Chief complaint-acute visit follow-up supratherapeutic INR with-anticoagulation management on Coumadin with history CVA--follow-up anemia thrombocytopenia-.  History of present illness.  Patient is a pleasant 74 year old female recently admitted here from home apparently secondary to weakness.  She also has a history of anemia apparently with a history of an occult positive stool-this was evaluated by her primary care provider before admission here .  Iron studies showed iron level XLIV total iron-binding capacity 300 B12 was 348 folate was 14 ferritin was elevated at 473.   Hemoglobin appears to run around the 10 range she also has some cytopenia with platelets ranging from 93 up 237,000 recently it appears.  In regards anticoagulation management INR today is elevated at 7.13-3 days ago it was normal at 2.16 There's been no increased bruising or bleeding.  Patient has no acute complaints today.  There's been no bleeding or bruising noted.   she is resting in bed comfortably.    Family medical social history as been reviewed per primary care provider note on 09/22/2014.  Medications have been reviewed per MAR.  They include.  Allopurinol 300 mg daily.  Atorvastatin 40 mg daily.  Baclofen 5 mg 3 times a day.  Claritin 10 mg daily.  Coumadin or milligrams daily.  Cozaar 25 mg daily.  Vicodin 7. 09/22/2023 milligrams twice a day when necessary.  Lasix 20 mg every other day.  Glucophage 5 mg twice a day.  Metoprolol 12.5 mg twice a day.  Neurontin 100 mg twice a day and 400 mg daily at bedtime.  Singulair  10 mg daily.  Review of systems.  In general does not complain of any fever or chills.  Skin does not complain of any diaphoresis itching or rash. or increased bruising or bleeding  Eyes does not  complain of visual changes.  Ear nose mouth and throat does not complain of sore throat or nasal discharge.  Cardiac no chest pain.  Respiratory does not complain of cough or increased shortness of breath.  GI does not complain of abdominal discomfort not complaining of constipation today    Skeletal skeletal has significant weakness but does not complaining of acute pain today.  Neurologic is not complaining of dizziness or headache.  Physical exam.  Temperature is 97.9 pulse 96 respirations 18 blood pressure 118/56.  In general this is an obese white female in no distress appears to be resting in bed comfortably.    Her skin is warm and drythere is a small crusted area on her left ankle-she also has a small bruise in her left arm although I don't believe this is new .  Eyes sclera and conjunctiva are clear pupils appear reactive to light visual acuity appears grossly intact  Chest is clear to auscultation there is no labored breathing.  Heart is regular rate and rhythm without murmur gallop or rub.  Her abdomen is obese soft nontender with slightly hypoactive bowel sounds this could be due to obesity.  Muscle skeletal has right-sided weakness with some contracture of her right hand this apparently is baseline left extremity strength appears intact she is able to move certainly her right extremities but again has weakness.  Neurologic as stated above her cranial nerves are intact speech is clear.  Psych she is alert and oriented pleasant and appropriate. .  Labs on  his eighth 2016.  INR 7.12.  We have done updated labs today that show a hemoglobin of 9.9 platelets of 75,000 white count of 5.3.  Sodium 138 potassium 4.2 BUN 27 creatinine 0.82-albumin 3.2 otherwise liver function tests grossly within normal limits bilirubin is minimally elevated at 1.3  .  12/16/2014.  WBC 5.3 hemoglobin 10.6 platelets 126.   12/15/2014.  Sodium 141 potassium 3.9 BUN 11  creatinine 0.55.    INR 3.86  12/11/2014.  WBC 5.0 hemoglobin 10.3 platelets 107.  Total iron-binding capacity 300--iron 44--ferritin 473--B12 348--folate 14--  Assessment and plan.   her 1-history CVA on chronic anticoagulation with Coumadin and INR is significantly elevated today there is no evidence of bleeding and bruising however will put her on bed rest hold her Coumadin monitor her and obtain an updated INR tomorrow   #2-anemia--r thrombocytopenia-this was discussed with Dr. Frances Furbish is some suspicion there may be an element of myelodysplastic syndrome-at this point will monitor platelets are down a bit from baseline this will have to be watched-hemo-globin of 9.9 today is relatively stable .   [id number]

## 2014-12-28 NOTE — Telephone Encounter (Signed)
[facility] 

## 2014-12-29 ENCOUNTER — Encounter (HOSPITAL_COMMUNITY)
Admission: RE | Admit: 2014-12-29 | Discharge: 2014-12-29 | Disposition: A | Discharge status: Home / Self Care | Payer: Medicare Other | Source: Skilled Nursing Facility | Attending: Internal Medicine | Admitting: Internal Medicine

## 2014-12-29 LAB — PROTIME-INR
INR: 7.99 (ref 0.00–1.49)
PROTHROMBIN TIME: 63.9 s — AB (ref 11.6–15.2)

## 2014-12-30 ENCOUNTER — Encounter (HOSPITAL_COMMUNITY)
Admission: AD | Admit: 2014-12-30 | Discharge: 2014-12-30 | Disposition: A | Discharge status: Home / Self Care | Payer: Medicare Other | Source: Skilled Nursing Facility | Attending: Internal Medicine | Admitting: Internal Medicine

## 2014-12-30 ENCOUNTER — Non-Acute Institutional Stay (SKILLED_NURSING_FACILITY): Payer: Medicare Other | Admitting: Internal Medicine

## 2014-12-30 DIAGNOSIS — D696 Thrombocytopenia, unspecified: Secondary | ICD-10-CM

## 2014-12-30 DIAGNOSIS — Z7901 Long term (current) use of anticoagulants: Secondary | ICD-10-CM

## 2014-12-30 DIAGNOSIS — R791 Abnormal coagulation profile: Secondary | ICD-10-CM | POA: Diagnosis not present

## 2014-12-30 DIAGNOSIS — G8191 Hemiplegia, unspecified affecting right dominant side: Secondary | ICD-10-CM

## 2014-12-30 DIAGNOSIS — Z8673 Personal history of transient ischemic attack (TIA), and cerebral infarction without residual deficits: Secondary | ICD-10-CM

## 2014-12-30 DIAGNOSIS — G8190 Hemiplegia, unspecified affecting unspecified side: Secondary | ICD-10-CM

## 2014-12-30 LAB — CBC
HEMATOCRIT: 30.7 % — AB (ref 36.0–46.0)
HEMOGLOBIN: 9.9 g/dL — AB (ref 12.0–15.0)
MCH: 27.1 pg (ref 26.0–34.0)
MCHC: 32.2 g/dL (ref 30.0–36.0)
MCV: 84.1 fL (ref 78.0–100.0)
Platelets: 75 10*3/uL — ABNORMAL LOW (ref 150–400)
RBC: 3.65 MIL/uL — ABNORMAL LOW (ref 3.87–5.11)
RDW: 17 % — ABNORMAL HIGH (ref 11.5–15.5)
WBC: 6.1 10*3/uL (ref 4.0–10.5)

## 2014-12-30 LAB — PROTIME-INR
INR: 1.52 — ABNORMAL HIGH (ref 0.00–1.49)
Prothrombin Time: 18.3 seconds — ABNORMAL HIGH (ref 11.6–15.2)

## 2014-12-30 NOTE — Progress Notes (Signed)
Patient ID: Brittany Clarke, female   DOB: 1940-06-01, 74 y.o.   MRN: [id number]      This is an acute visit.  Level care skilled.  Facility [facility].  Chief complaint-acute visit follow-up supratherapeutic INR with-anticoagulation management on Coumadin with history CVA--follow-up anemia thrombocytopenia-.  History of present illness.  Patient is a pleasant 74 year old female recently admitted here from home apparently secondary to weakness.  She also has a history of anemia apparently with a history of an occult positive stool-this was evaluated by her primary care provider before admission here .  Iron studies showed iron level XLIV total iron-binding capacity 300 B12 was 348 folate was 14 ferritin was elevated at 473.   Hemoglobin appears to run around the 10 range she also has thrombo cytopenia with platelets ranging from 93 up 237,000 recently it appears.  We have been monitoring her INR it was significantly elevated at 7.12 back on August 8 and actually rose the next day up to 7.99 she did receive vitamin K and today it has come down to 1.52  There has been no increased bruising or bleeding we have been monitoring her labs closely secondary to her history of anemia occult positive stool and thrombocytopenia   her hemoglobin on 12-28-14 9.9-platelets  75,000  There's been no increased bleeding or bruising from baseline clinically she appears to be at her baseline.  Vital signs remain stable  .    Family medical social history has been reviewed per primary care provider note on 09/22/2014.  Medications have been reviewed per MAR.  They include.  Allopurinol 300 mg daily.  Atorvastatin 40 mg daily.  Baclofen 5 mg 3 times a day.  Claritin 10 mg daily.  Cozaar 25 mg daily.  Vicodin 7. 09/22/2023 milligrams twice a day when necessary.  Lasix 20 mg every other day.  Glucophage 5 mg twice a day.  Metoprolol 12.5 mg twice a day.  Neurontin 100 mg twice a day  and 400 mg daily at bedtime.  Singulair  10 mg daily.  Review of systems.  In general does not complain of any fever or chills.  Skin does not complain of any diaphoresis itching or rash. or increased bruising or bleeding  Eyes does not complain of visual changes.  Ear nose mouth and throat does not complain of sore throat or nasal discharge.  Cardiac no chest pain.  Respiratory does not complain of cough or increased shortness of breath.  GI does not complain of abdominal discomfort not complaining of constipation today  Rectal does complain of hemorrhoid pain at times    M- skeletal has significant weakness but does not complaining of acute pain today.  Neurologic is not complaining of dizziness or headache.  Physical exam.  T- 98.4 pulse 92 respirations 20 blood pressure 106/82  In general this is an obese white female in no distress appears to be resting in bed comfortably.    Her skin is warm and dryt I do not any note any new increased bruising.  Eyes sclera and conjunctiva are clear pupils appear reactive to light visual acuity appears grossly intact  Chest is clear to auscultation there is no labored breathing.  Heart is regular rate and rhythm without murmur gallop or rub appears to have baseline lower extremity edema venous stasis changes bilaterally.  Her abdomen is obese soft nontender with slightly hypoactive bowel sounds this could be due to obesity.  Muscle skeletal has right-sided weakness with some contracture of her right  hand this apparently is baseline left extremity strength appears intact she is able to move certainly her right extremities but again has weakness.  Neurologic as stated above her cranial nerves are intact speech is clear.  Psych she is alert and oriented pleasant and appropriate. . AB's.  12/30/2014.  INR 1.5 to.  12/29/2014 7.99.  12/28/2014 7.12  Labs onAugust eighth 2016.  INR 7.12. 12/28/2014  hemoglobin of 9.9  platelets of 75,000 white count of 5.3.  Sodium 138 potassium 4.2 BUN 27 creatinine 0.82-albumin 3.2 otherwise liver function tests grossly within normal limits bilirubin is minimally elevated at 1.3  .  12/16/2014.  WBC 5.3 hemoglobin 10.6 platelets 126.   12/15/2014.  Sodium 141 potassium 3.9 BUN 11 creatinine 0.55.    INR 3.86  12/11/2014.  WBC 5.0 hemoglobin 10.3 platelets 107.  Total iron-binding capacity 300--iron 44--ferritin 473--B12 348--folate 14--  Assessment and plan.   her 1-history CVA on chronic anticoagulation with Coumadin  INR has come down significantly from yesterday at 1.5 2 it was 7.99 yesterday-will restart Coumadin at 3 mg a day--she initially was on 5 mg a day this was titrated down to 4 mg a day when her INR first went up at 3.86-however after a t period of stability -- appeared to rise significantly-- this will have to be checked closely however with what appears to be an extremely variable INR-we'll check a PT/INR tomorrow.     #2-anemia--r thrombocytopenia-this was discussed with Dr. Frances Furbish is some suspicion there may be an element of myelodysplastic syndrome Her hemoglobin appears to be stable at 9.9 platelets are down a bit at 75,000-this will have to be monitored closely as well --recheck this on Friday.   .   [id number]

## 2014-12-31 ENCOUNTER — Ambulatory Visit: Payer: Medicare Other | Admitting: Family Medicine

## 2014-12-31 LAB — PROTIME-INR
INR: 1.43 (ref 0.00–1.49)
PROTHROMBIN TIME: 17.6 s — AB (ref 11.6–15.2)

## 2014-12-31 LAB — OCCULT BLOOD X 1 CARD TO LAB, STOOL: FECAL OCCULT BLD: POSITIVE — AB

## 2015-01-01 ENCOUNTER — Inpatient Hospital Stay (HOSPITAL_COMMUNITY)
Admission: EM | Admit: 2015-01-01 | Discharge: 2015-01-09 | DRG: 682 | Disposition: A | Discharge status: Home Health | Payer: Medicare Other | Attending: Internal Medicine | Admitting: Internal Medicine

## 2015-01-01 ENCOUNTER — Non-Acute Institutional Stay (SKILLED_NURSING_FACILITY): Payer: Medicare Other | Admitting: Internal Medicine

## 2015-01-01 ENCOUNTER — Emergency Department (HOSPITAL_COMMUNITY): Payer: Medicare Other

## 2015-01-01 ENCOUNTER — Encounter: Payer: Self-pay | Admitting: Internal Medicine

## 2015-01-01 ENCOUNTER — Encounter (HOSPITAL_COMMUNITY): Payer: Self-pay | Admitting: *Deleted

## 2015-01-01 DIAGNOSIS — R509 Fever, unspecified: Secondary | ICD-10-CM | POA: Diagnosis not present

## 2015-01-01 DIAGNOSIS — N179 Acute kidney failure, unspecified: Secondary | ICD-10-CM | POA: Diagnosis not present

## 2015-01-01 DIAGNOSIS — J449 Chronic obstructive pulmonary disease, unspecified: Secondary | ICD-10-CM | POA: Diagnosis not present

## 2015-01-01 DIAGNOSIS — E119 Type 2 diabetes mellitus without complications: Secondary | ICD-10-CM | POA: Diagnosis not present

## 2015-01-01 DIAGNOSIS — K625 Hemorrhage of anus and rectum: Secondary | ICD-10-CM

## 2015-01-01 DIAGNOSIS — H9202 Otalgia, left ear: Secondary | ICD-10-CM

## 2015-01-01 DIAGNOSIS — R0602 Shortness of breath: Secondary | ICD-10-CM | POA: Diagnosis not present

## 2015-01-01 DIAGNOSIS — R600 Localized edema: Secondary | ICD-10-CM

## 2015-01-01 DIAGNOSIS — D6832 Hemorrhagic disorder due to extrinsic circulating anticoagulants: Secondary | ICD-10-CM

## 2015-01-01 DIAGNOSIS — Z6837 Body mass index (BMI) 37.0-37.9, adult: Secondary | ICD-10-CM

## 2015-01-01 DIAGNOSIS — D649 Anemia, unspecified: Secondary | ICD-10-CM

## 2015-01-01 DIAGNOSIS — E876 Hypokalemia: Secondary | ICD-10-CM | POA: Diagnosis not present

## 2015-01-01 DIAGNOSIS — M7989 Other specified soft tissue disorders: Secondary | ICD-10-CM | POA: Diagnosis not present

## 2015-01-01 DIAGNOSIS — R1310 Dysphagia, unspecified: Secondary | ICD-10-CM

## 2015-01-01 DIAGNOSIS — Z7901 Long term (current) use of anticoagulants: Secondary | ICD-10-CM

## 2015-01-01 DIAGNOSIS — Z833 Family history of diabetes mellitus: Secondary | ICD-10-CM

## 2015-01-01 DIAGNOSIS — G9340 Encephalopathy, unspecified: Secondary | ICD-10-CM | POA: Diagnosis not present

## 2015-01-01 DIAGNOSIS — T45515A Adverse effect of anticoagulants, initial encounter: Secondary | ICD-10-CM

## 2015-01-01 DIAGNOSIS — E785 Hyperlipidemia, unspecified: Secondary | ICD-10-CM | POA: Diagnosis not present

## 2015-01-01 DIAGNOSIS — I5040 Unspecified combined systolic (congestive) and diastolic (congestive) heart failure: Secondary | ICD-10-CM

## 2015-01-01 DIAGNOSIS — K5900 Constipation, unspecified: Secondary | ICD-10-CM | POA: Diagnosis present

## 2015-01-01 DIAGNOSIS — R40241 Glasgow coma scale score 13-15: Secondary | ICD-10-CM | POA: Diagnosis not present

## 2015-01-01 DIAGNOSIS — I959 Hypotension, unspecified: Secondary | ICD-10-CM | POA: Diagnosis present

## 2015-01-01 DIAGNOSIS — G8191 Hemiplegia, unspecified affecting right dominant side: Secondary | ICD-10-CM

## 2015-01-01 DIAGNOSIS — R791 Abnormal coagulation profile: Secondary | ICD-10-CM | POA: Diagnosis not present

## 2015-01-01 DIAGNOSIS — M8580 Other specified disorders of bone density and structure, unspecified site: Secondary | ICD-10-CM

## 2015-01-01 DIAGNOSIS — Z8673 Personal history of transient ischemic attack (TIA), and cerebral infarction without residual deficits: Secondary | ICD-10-CM

## 2015-01-01 DIAGNOSIS — R531 Weakness: Secondary | ICD-10-CM | POA: Diagnosis not present

## 2015-01-01 DIAGNOSIS — D6489 Other specified anemias: Secondary | ICD-10-CM | POA: Diagnosis not present

## 2015-01-01 DIAGNOSIS — F329 Major depressive disorder, single episode, unspecified: Secondary | ICD-10-CM | POA: Diagnosis present

## 2015-01-01 DIAGNOSIS — N289 Disorder of kidney and ureter, unspecified: Secondary | ICD-10-CM

## 2015-01-01 DIAGNOSIS — K649 Unspecified hemorrhoids: Secondary | ICD-10-CM | POA: Diagnosis not present

## 2015-01-01 DIAGNOSIS — Z79891 Long term (current) use of opiate analgesic: Secondary | ICD-10-CM

## 2015-01-01 DIAGNOSIS — R1319 Other dysphagia: Secondary | ICD-10-CM

## 2015-01-01 DIAGNOSIS — Z853 Personal history of malignant neoplasm of breast: Secondary | ICD-10-CM

## 2015-01-01 DIAGNOSIS — D500 Iron deficiency anemia secondary to blood loss (chronic): Secondary | ICD-10-CM | POA: Diagnosis not present

## 2015-01-01 DIAGNOSIS — I10 Essential (primary) hypertension: Secondary | ICD-10-CM | POA: Diagnosis present

## 2015-01-01 DIAGNOSIS — R5383 Other fatigue: Secondary | ICD-10-CM | POA: Diagnosis not present

## 2015-01-01 DIAGNOSIS — E860 Dehydration: Secondary | ICD-10-CM | POA: Diagnosis not present

## 2015-01-01 DIAGNOSIS — D696 Thrombocytopenia, unspecified: Secondary | ICD-10-CM | POA: Diagnosis not present

## 2015-01-01 DIAGNOSIS — I519 Heart disease, unspecified: Secondary | ICD-10-CM | POA: Diagnosis not present

## 2015-01-01 DIAGNOSIS — D699 Hemorrhagic condition, unspecified: Secondary | ICD-10-CM

## 2015-01-01 DIAGNOSIS — R4182 Altered mental status, unspecified: Secondary | ICD-10-CM

## 2015-01-01 DIAGNOSIS — D638 Anemia in other chronic diseases classified elsewhere: Secondary | ICD-10-CM | POA: Diagnosis not present

## 2015-01-01 DIAGNOSIS — I509 Heart failure, unspecified: Secondary | ICD-10-CM | POA: Diagnosis present

## 2015-01-01 DIAGNOSIS — M5136 Other intervertebral disc degeneration, lumbar region: Secondary | ICD-10-CM

## 2015-01-01 DIAGNOSIS — I69391 Dysphagia following cerebral infarction: Secondary | ICD-10-CM | POA: Diagnosis not present

## 2015-01-01 DIAGNOSIS — M51369 Other intervertebral disc degeneration, lumbar region without mention of lumbar back pain or lower extremity pain: Secondary | ICD-10-CM

## 2015-01-01 DIAGNOSIS — K5909 Other constipation: Secondary | ICD-10-CM

## 2015-01-01 DIAGNOSIS — R400 Somnolence: Secondary | ICD-10-CM | POA: Diagnosis not present

## 2015-01-01 DIAGNOSIS — Z8719 Personal history of other diseases of the digestive system: Secondary | ICD-10-CM

## 2015-01-01 DIAGNOSIS — Z8249 Family history of ischemic heart disease and other diseases of the circulatory system: Secondary | ICD-10-CM | POA: Diagnosis not present

## 2015-01-01 DIAGNOSIS — Z743 Need for continuous supervision: Secondary | ICD-10-CM | POA: Diagnosis not present

## 2015-01-01 DIAGNOSIS — E11610 Type 2 diabetes mellitus with diabetic neuropathic arthropathy: Secondary | ICD-10-CM

## 2015-01-01 DIAGNOSIS — K921 Melena: Secondary | ICD-10-CM

## 2015-01-01 DIAGNOSIS — Z803 Family history of malignant neoplasm of breast: Secondary | ICD-10-CM | POA: Diagnosis not present

## 2015-01-01 DIAGNOSIS — R0902 Hypoxemia: Secondary | ICD-10-CM | POA: Diagnosis not present

## 2015-01-01 DIAGNOSIS — A419 Sepsis, unspecified organism: Secondary | ICD-10-CM

## 2015-01-01 DIAGNOSIS — Z23 Encounter for immunization: Secondary | ICD-10-CM

## 2015-01-01 DIAGNOSIS — R300 Dysuria: Secondary | ICD-10-CM

## 2015-01-01 DIAGNOSIS — M6281 Muscle weakness (generalized): Secondary | ICD-10-CM

## 2015-01-01 LAB — CBC WITH DIFFERENTIAL/PLATELET
Basophils Absolute: 0 10*3/uL (ref 0.0–0.1)
Basophils Relative: 1 % (ref 0–1)
EOS ABS: 0.1 10*3/uL (ref 0.0–0.7)
Eosinophils Relative: 2 % (ref 0–5)
HCT: 28.3 % — ABNORMAL LOW (ref 36.0–46.0)
HEMOGLOBIN: 9.2 g/dL — AB (ref 12.0–15.0)
Lymphocytes Relative: 31 % (ref 12–46)
Lymphs Abs: 1.9 10*3/uL (ref 0.7–4.0)
MCH: 27.1 pg (ref 26.0–34.0)
MCHC: 32.5 g/dL (ref 30.0–36.0)
MCV: 83.5 fL (ref 78.0–100.0)
MONOS PCT: 31 % — AB (ref 3–12)
Monocytes Absolute: 1.9 10*3/uL — ABNORMAL HIGH (ref 0.1–1.0)
Neutro Abs: 2.1 10*3/uL (ref 1.7–7.7)
Neutrophils Relative %: 35 % — ABNORMAL LOW (ref 43–77)
PLATELETS: 87 10*3/uL — AB (ref 150–400)
RBC: 3.39 MIL/uL — ABNORMAL LOW (ref 3.87–5.11)
RDW: 17.3 % — ABNORMAL HIGH (ref 11.5–15.5)
Smear Review: DECREASED
WBC: 6 10*3/uL (ref 4.0–10.5)

## 2015-01-01 LAB — C DIFFICILE QUICK SCREEN W PCR REFLEX
C DIFFICILE (CDIFF) TOXIN: NEGATIVE
C DIFFICLE (CDIFF) ANTIGEN: NEGATIVE
C Diff interpretation: NEGATIVE

## 2015-01-01 LAB — COMPREHENSIVE METABOLIC PANEL
ALK PHOS: 58 U/L (ref 38–126)
ALT: 14 U/L (ref 14–54)
AST: 33 U/L (ref 15–41)
Albumin: 3.1 g/dL — ABNORMAL LOW (ref 3.5–5.0)
Anion gap: 11 (ref 5–15)
BUN: 57 mg/dL — ABNORMAL HIGH (ref 6–20)
CHLORIDE: 98 mmol/L — AB (ref 101–111)
CO2: 25 mmol/L (ref 22–32)
Calcium: 9.5 mg/dL (ref 8.9–10.3)
Creatinine, Ser: 1.69 mg/dL — ABNORMAL HIGH (ref 0.44–1.00)
GFR calc Af Amer: 34 mL/min — ABNORMAL LOW (ref >60)
GFR calc non Af Amer: 29 mL/min — ABNORMAL LOW (ref >60)
Glucose, Bld: 104 mg/dL — ABNORMAL HIGH (ref 65–99)
POTASSIUM: 3.9 mmol/L (ref 3.5–5.1)
SODIUM: 134 mmol/L — AB (ref 135–145)
Total Bilirubin: 1.3 mg/dL — ABNORMAL HIGH (ref 0.3–1.2)
Total Protein: 6 g/dL — ABNORMAL LOW (ref 6.5–8.1)

## 2015-01-01 LAB — URINALYSIS, ROUTINE W REFLEX MICROSCOPIC
Glucose, UA: NEGATIVE mg/dL
Hgb urine dipstick: NEGATIVE
Ketones, ur: NEGATIVE mg/dL
LEUKOCYTES UA: NEGATIVE
NITRITE: NEGATIVE
PROTEIN: NEGATIVE mg/dL
Specific Gravity, Urine: >1.03 — ABNORMAL HIGH (ref 1.005–1.030)
Urobilinogen, UA: 1 mg/dL (ref 0.0–1.0)
pH: 5 (ref 5.0–8.0)

## 2015-01-01 LAB — CBC
HCT: 30 % — ABNORMAL LOW (ref 36.0–46.0)
HEMOGLOBIN: 10 g/dL — AB (ref 12.0–15.0)
MCH: 27.8 pg (ref 26.0–34.0)
MCHC: 33.3 g/dL (ref 30.0–36.0)
MCV: 83.3 fL (ref 78.0–100.0)
PLATELETS: 79 10*3/uL — AB (ref 150–400)
RBC: 3.6 MIL/uL — AB (ref 3.87–5.11)
RDW: 17.5 % — ABNORMAL HIGH (ref 11.5–15.5)
WBC: 7.2 10*3/uL (ref 4.0–10.5)

## 2015-01-01 LAB — BASIC METABOLIC PANEL
Anion gap: 13 (ref 5–15)
BUN: 63 mg/dL — ABNORMAL HIGH (ref 6–20)
CO2: 23 mmol/L (ref 22–32)
Calcium: 9.8 mg/dL (ref 8.9–10.3)
Chloride: 98 mmol/L — ABNORMAL LOW (ref 101–111)
Creatinine, Ser: 1.86 mg/dL — ABNORMAL HIGH (ref 0.44–1.00)
GFR calc non Af Amer: 26 mL/min — ABNORMAL LOW (ref >60)
GFR, EST AFRICAN AMERICAN: 30 mL/min — AB (ref >60)
GLUCOSE: 97 mg/dL (ref 65–99)
POTASSIUM: 4.3 mmol/L (ref 3.5–5.1)
SODIUM: 134 mmol/L — AB (ref 135–145)

## 2015-01-01 LAB — PROTIME-INR
INR: 2.19 — ABNORMAL HIGH (ref 0.00–1.49)
PROTHROMBIN TIME: 24.2 s — AB (ref 11.6–15.2)

## 2015-01-01 MED ORDER — ONDANSETRON HCL 4 MG PO TABS: 4.0000 mg | ORAL_TABLET | Freq: Four times a day (QID) | ORAL | Status: DC | PRN

## 2015-01-01 MED ORDER — HYDROCODONE-ACETAMINOPHEN 7.5-325 MG PO TABS
1.0000 | ORAL_TABLET | Freq: Two times a day (BID) | ORAL | Status: DC | PRN
  Administered 2015-01-02 – 2015-01-09 (×7): 1 via ORAL
  Filled 2015-01-01 (×7): qty 1

## 2015-01-01 MED ORDER — SODIUM CHLORIDE 0.9 % IJ SOLN
3.0000 mL | Freq: Two times a day (BID) | INTRAMUSCULAR | Status: DC
  Administered 2015-01-02 – 2015-01-09 (×10): 3 mL via INTRAVENOUS

## 2015-01-01 MED ORDER — GLUCERNA SHAKE PO LIQD
237.0000 mL | Freq: Three times a day (TID) | ORAL | Status: DC
  Administered 2015-01-01 – 2015-01-09 (×13): 237 mL via ORAL

## 2015-01-01 MED ORDER — NALOXONE HCL 0.4 MG/ML IJ SOLN
0.4000 mg | Freq: Once | INTRAMUSCULAR | Status: AC
  Administered 2015-01-01: 0.4 mg via INTRAVENOUS
  Filled 2015-01-01: qty 1

## 2015-01-01 MED ORDER — ONDANSETRON HCL 4 MG/2ML IJ SOLN: 4.0000 mg | Freq: Four times a day (QID) | INTRAMUSCULAR | Status: DC | PRN

## 2015-01-01 MED ORDER — MONTELUKAST SODIUM 10 MG PO TABS: 10.0000 mg | ORAL_TABLET | Freq: Every evening | ORAL | Status: DC

## 2015-01-01 MED ORDER — WARFARIN SODIUM 2 MG PO TABS
4.0000 mg | ORAL_TABLET | Freq: Once | ORAL | Status: AC
  Administered 2015-01-01: 4 mg via ORAL
  Filled 2015-01-01: qty 2
  Filled 2015-01-01: qty 1

## 2015-01-01 MED ORDER — GABAPENTIN 100 MG PO CAPS
100.0000 mg | ORAL_CAPSULE | Freq: Two times a day (BID) | ORAL | Status: DC
  Administered 2015-01-01 – 2015-01-09 (×15): 100 mg via ORAL
  Filled 2015-01-01 (×15): qty 1

## 2015-01-01 MED ORDER — ALLOPURINOL 300 MG PO TABS
300.0000 mg | ORAL_TABLET | Freq: Every day | ORAL | Status: DC
  Administered 2015-01-02 – 2015-01-09 (×8): 300 mg via ORAL
  Filled 2015-01-01 (×8): qty 1

## 2015-01-01 MED ORDER — WARFARIN SODIUM 4 MG PO TABS: 4.0000 mg | ORAL_TABLET | Freq: Every evening | ORAL | Status: DC

## 2015-01-01 MED ORDER — ATORVASTATIN CALCIUM 40 MG PO TABS
40.0000 mg | ORAL_TABLET | Freq: Every day | ORAL | Status: DC
  Administered 2015-01-01 – 2015-01-08 (×8): 40 mg via ORAL
  Filled 2015-01-01 (×9): qty 1

## 2015-01-01 MED ORDER — HYDROCORTISONE 2.5 % RE CREA
1.0000 "application " | TOPICAL_CREAM | Freq: Four times a day (QID) | RECTAL | Status: DC | PRN
  Filled 2015-01-01: qty 28.35

## 2015-01-01 MED ORDER — SODIUM CHLORIDE 0.9 % IV BOLUS (SEPSIS): 500.0000 mL | Freq: Once | INTRAVENOUS | Status: DC

## 2015-01-01 MED ORDER — SODIUM CHLORIDE 0.9 % IV SOLN
1000.0000 mL | Freq: Once | INTRAVENOUS | Status: AC
  Administered 2015-01-01: 500 mL via INTRAVENOUS

## 2015-01-01 MED ORDER — LORATADINE 10 MG PO TABS: 10.0000 mg | ORAL_TABLET | Freq: Every day | ORAL | Status: DC | PRN

## 2015-01-01 MED ORDER — SODIUM CHLORIDE 0.9 % IV BOLUS (SEPSIS)
500.0000 mL | Freq: Once | INTRAVENOUS | Status: AC
Start: 2015-01-01 — End: 2015-01-01
  Administered 2015-01-01: 500 mL via INTRAVENOUS

## 2015-01-01 MED ORDER — METOPROLOL TARTRATE 25 MG PO TABS
12.5000 mg | ORAL_TABLET | Freq: Two times a day (BID) | ORAL | Status: DC
  Administered 2015-01-01 – 2015-01-09 (×16): 12.5 mg via ORAL
  Filled 2015-01-01 (×16): qty 1

## 2015-01-01 MED ORDER — SODIUM CHLORIDE 0.9 % IV SOLN
INTRAVENOUS | Status: DC
Start: 2015-01-01 — End: 2015-01-03
  Administered 2015-01-02: 22:00:00 via INTRAVENOUS
  Administered 2015-01-02: 800 mL via INTRAVENOUS

## 2015-01-01 MED ORDER — WARFARIN - PHARMACIST DOSING INPATIENT: Status: DC

## 2015-01-01 MED ORDER — MONTELUKAST SODIUM 10 MG PO TABS
10.0000 mg | ORAL_TABLET | Freq: Every evening | ORAL | Status: DC
  Administered 2015-01-01 – 2015-01-08 (×8): 10 mg via ORAL
  Filled 2015-01-01 (×8): qty 1

## 2015-01-01 MED ORDER — SODIUM CHLORIDE 0.9 % IV SOLN
Freq: Once | INTRAVENOUS | Status: AC
  Administered 2015-01-01: 18:00:00 via INTRAVENOUS

## 2015-01-01 MED ORDER — SODIUM CHLORIDE 0.9 % IV SOLN: INTRAVENOUS | Status: DC

## 2015-01-01 MED ORDER — PRO-STAT SUGAR FREE PO LIQD
30.0000 mL | Freq: Two times a day (BID) | ORAL | Status: DC
  Administered 2015-01-02 – 2015-01-08 (×14): 30 mL via ORAL
  Filled 2015-01-01 (×15): qty 30

## 2015-01-01 NOTE — ED Notes (Addendum)
Pt is moaning and groaning c/o "it hurts". Pt will not open eyes or give clear explanation of what hurts. Pt not responding appropriately and seems to be mumbling words but not making sentences. Pt's mental status seems to have declined from initial time in ED. Dr. Lacinda Axon made aware and said he will be in to see pt. Family at bedside and very concerned about mother's mental status changes.

## 2015-01-01 NOTE — ED Notes (Signed)
Per [facility] pt started showing signs of lethargy and increased generalized weakness after lunch. Labs this morning showed "increased creatinine and low hemoglobin". EMS transported pt to ED. Pt reports weakness. Pt's eyes closed, but easily arouses and is oriented x 4. CBG on EMS 115.

## 2015-01-01 NOTE — ED Notes (Signed)
MD at bedside. 

## 2015-01-01 NOTE — Progress Notes (Signed)
ANTICOAGULATION CONSULT NOTE - Initial Consult  Pharmacy Consult for Coumadin (chronic Rx PTA) Indication: stroke  Allergies  Allergen Reactions  . Ultram [Tramadol] Shortness Of Breath    Patient Measurements: Height: 5\' 5"  (165.1 cm) Weight: 225 lb (102.059 kg) IBW/kg (Calculated) : 57  Vital Signs: Temp: 98.3 F (36.8 C) (08/12 1508) Temp Source: Oral (08/12 1508) BP: 97/52 mmHg (08/12 2000) Pulse Rate: 94 (08/12 2000)  Labs:  Recent Labs  12/30/14 0755 12/30/14 1115 12/31/14 0755 01/01/15 0700 01/01/15 1529 01/01/15 1645  HGB 9.9*  --   --  9.2* 10.0*  --   HCT 30.7*  --   --  28.3* 30.0*  --   PLT 75*  --   --  87* 79*  --   LABPROT  --  18.3* 17.6* 24.2*  --   --   INR  --  1.52* 1.43 2.19*  --   --   CREATININE  --   --   --  1.69*  --  1.86*    Estimated Creatinine Clearance: 31.9 mL/min (by C-G formula based on Cr of 1.86).   Medical History: Past Medical History  Diagnosis Date  . CHF (congestive heart failure)   . Diabetes mellitus without complication   . COPD (chronic obstructive pulmonary disease)   . Hypertension   . Asthma   . Shortness of breath   . Stroke     2013  . Anxiety   . Pneumonia   . Gout   . 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 Anticoagulant long-term use     Medications:  See PTA med list  Assessment: 74yo female on chronic Coumadin PTA.  INR therapeutic on admission.  Home dose reportedly 4mg  daily.  Goal of Therapy:  INR 2-3 Monitor platelets by anticoagulation protocol: Yes   Plan:  Coumadin 4mg  po x 1 INR daily  [place] Crane, Viana Sleep A 01/01/2015,8:15 PM

## 2015-01-01 NOTE — H&P (Signed)
[facility] History and Physical  [name]:[id number] DOB: [birth year] DOA: 01/01/2015  Referring physician: ER, Dr. Lacinda Axon PCP: [name], MD   Chief Complaint: Altered mental status  HPI: Brittany Clarke is a 74 y.o. female  This is a 74 year old lady who, according to her children who are at the bedside, has had alteration in her mental status over the last 3-4 days. She has had decreased alertness and decreased by mouth intake. She is normally conversant and able to walk with a walker. She is has been in the skilled nursing facility for the last 20 days or so receiving rehabilitation from a stroke in 2013. She did not have any history of nausea, vomiting, fever or abdominal pain. There is no history of dyspnea or chest pain. Evaluation in the emergency room shows it to be somewhat hypotensive and her renal function has deteriorated. She is now being admitted for further management.   Review of Systems:  Apart from symptoms above, all systems negative.  Past Medical History  Diagnosis Date  . CHF (congestive heart failure)   . Diabetes mellitus without complication   . COPD (chronic obstructive pulmonary disease)   . Hypertension   . Asthma   . Shortness of breath   . Stroke     2013  . Anxiety   . Pneumonia   . Gout   . 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 Anticoagulant long-term use    Past Surgical History  Procedure Laterality Date  . Tubal ligation    . Colonoscopy  04/04/2002    GYF:VCBSWHQP hemorrhoids, otherwise, normal rectum/Scattered left sided diverticula/Somewhat adenomatous appearing ileocecal valve which is probably normal but this area was biopsied. The remainder of the colonic mucosa appeared   normal  . Trigger finger surgery      twice  . Colonoscopy with esophagogastroduodenoscopy (egd) N/A 02/20/2013    Procedure: COLONOSCOPY WITH ESOPHAGOGASTRODUODENOSCOPY (EGD);  Surgeon: Daneil Dolin, MD;  Location: [facility];  Service: Endoscopy;  Laterality: N/A;  . Breast surgery Left 2005    lumpectomy and removed lymph nodes   Social History:  reports that she has never smoked. She has never used smokeless tobacco. She reports that she does not drink alcohol or use illicit drugs.  Allergies  Allergen Reactions  . Ultram [Tramadol] Shortness Of Breath    Family History  Problem Relation Age of Onset  . Colon cancer Neg Hx   . Liver disease Neg Hx   . Heart disease Mother   . Hypertension Mother   . Cancer Father     lung  . Cancer Brother     prostate  . Diabetes Brother   . Diabetes Sister   . Cancer Sister     breast cancer  . Hypertension Sister   . Diabetes Sister   . Hypertension Sister   . Diabetes Sister   . Hypertension Sister   . Hypertension Sister   . Cancer Brother   . Diabetes Brother   . Diabetes Brother     Prior to Admission medications   Medication Sig Start Date End Date Taking? Authorizing Provider  allopurinol (ZYLOPRIM) 300 MG tablet TAKE ONE (1) TABLET EACH DAY 10/08/14  Yes [name], MD  Amino Acids-Protein Hydrolys (FEEDING SUPPLEMENT, PRO-STAT SUGAR FREE 64,) LIQD Take 30 mLs by mouth 2 (two) times daily.   Yes Historical Provider, MD  atorvastatin (LIPITOR) 40 MG tablet TAKE ONE (  1) TABLET EACH DAY 11/30/14  Yes [name], MD  baclofen (LIORESAL) 10 MG tablet TAKE 1/2 TABLET 3 TIMES DAILY 11/30/14  Yes [name], MD  feeding supplement (GLUCERNA SHAKE) LIQD Take 237 mLs by mouth 3 (three) times daily between meals. Patient taking differently: Take 120 mLs by mouth See admin instructions. Given with medications per patient request per [facility] 06/12/12  Yes Bobby Rumpf York, PA-C  furosemide (LASIX) 20 MG tablet Take 20 mg by mouth every other day.  12/02/12  Yes Vernie Shanks, MD  gabapentin (NEURONTIN) 100 MG capsule Take 100 mg by mouth 2 (two) times daily.   Yes Historical Provider, MD  gabapentin (NEURONTIN) 400 MG capsule TAKE 1  CAPSULE DAILY EVERY EVENING 10/08/14  Yes [name], MD  HYDROcodone-acetaminophen [facility]) 7.5-325 MG per tablet Take one tablet by mouth twice daily as needed for pain. Do not exceed 3gm of APAP/24 form all sources. Patient taking differently: Take 1 tablet by mouth 2 (two) times daily as needed for moderate pain or severe pain. Take one tablet by mouth twice daily as needed for pain. Do not exceed 3gm of APAP/24 form all sources. 12/28/14  Yes Tiffany L Reed, DO  hydrocortisone (ANUSOL-HC) 2.5 % rectal cream Place 1 application rectally 4 (four) times daily as needed for hemorrhoids or itching.   Yes Historical Provider, MD  loratadine (CLARITIN) 10 MG tablet TAKE ONE (1) TABLET EACH DAY 07/06/14  Yes [name], MD  losartan (COZAAR) 25 MG tablet TAKE ONE (1) TABLET EACH DAY 11/30/14  Yes [name], MD  metFORMIN (GLUCOPHAGE) 500 MG tablet TAKE ONE TABLET TWICE A DAY WITH FOOD 11/30/14  Yes [name], MD  metoprolol tartrate (LOPRESSOR) 25 MG tablet TAKE 1/2 TABLET TWICE DAILY 11/30/14  Yes [name], MD  montelukast (SINGULAIR) 10 MG tablet TAKE 1 TABLET DAILY IN THE EVENING 11/30/14  Yes [name], MD  warfarin (COUMADIN) 4 MG tablet Take 4 mg by mouth every evening.   Yes Historical Provider, MD   Physical Exam: Filed Vitals:   01/01/15 1713 01/01/15 1743 01/01/15 1800 01/01/15 1900  BP: 104/65 113/64 114/58 98/55  Pulse: 81 85 86 91  Temp:      TempSrc:      Resp:  20 16 18   Height:      Weight:      SpO2: 96% 94% 95% 92%    Wt Readings from Last 3 Encounters:  01/01/15 102.059 kg (225 lb)  12/02/14 102.059 kg (225 lb)  11/12/14 102.059 kg (225 lb)    General:  Appears very drowsy. She does respond to painful stimuli with wincing. There is no verbalization. She does not open her eyes. She does look clinically dehydrated. She is somewhat hypotensive. Eyes: PERRL, normal lids, irises & conjunctiva ENT: grossly normal hearing, lips & tongue Neck:  no LAD, masses or thyromegaly Cardiovascular: RRR, no m/r/g. No LE edema. Telemetry: SR, no arrhythmias  Respiratory: CTA bilaterally, no w/r/r. Normal respiratory effort. Abdomen: soft, ntnd Skin: no rash or induration seen on limited exam Musculoskeletal: grossly normal tone BUE/BLE Psychiatric: Not examined. Neurologic: grossly non-focal.          Labs on Admission:  Basic Metabolic Panel:  Recent Labs Lab 12/28/14 1430 01/01/15 0700 01/01/15 1645  NA 138 134* 134*  K 4.2 3.9 4.3  CL 103 98* 98*  CO2 25 25 23   GLUCOSE 84 104* 97  BUN 27* 57* 63*  CREATININE 0.82 1.69* 1.86*  CALCIUM 9.7 9.5 9.8   Liver Function Tests:  Recent Labs Lab 12/28/14 1430 01/01/15 0700  AST 31 33  ALT 13* 14  ALKPHOS 72 58  BILITOT 1.3* 1.3*  PROT 6.1* 6.0*  ALBUMIN 3.2* 3.1*   No results for input(s): LIPASE, AMYLASE in the last 168 hours. No results for input(s): AMMONIA in the last 168 hours. CBC:  Recent Labs Lab 12/28/14 1430 12/30/14 0755 01/01/15 0700 01/01/15 1529  WBC 5.3 6.1 6.0 7.2  NEUTROABS 2.1  --  2.1  --   HGB 9.9* 9.9* 9.2* 10.0*  HCT 30.5* 30.7* 28.3* 30.0*  MCV 84.5 84.1 83.5 83.3  PLT 75* 75* 87* 79*   Cardiac Enzymes: No results for input(s): CKTOTAL, CKMB, CKMBINDEX, TROPONINI in the last 168 hours.  BNP (last 3 results) No results for input(s): BNP in the last 8760 hours.  ProBNP (last 3 results) No results for input(s): PROBNP in the last 8760 hours.  CBG: No results for input(s): GLUCAP in the last 168 hours.  Radiological Exams on Admission: Ct Head Wo Contrast  01/01/2015   CLINICAL DATA:  Altered mental status  EXAM: CT HEAD WITHOUT CONTRAST  TECHNIQUE: Contiguous axial images were obtained from the base of the skull through the vertex without intravenous contrast.  COMPARISON:  None.  FINDINGS: No evidence of parenchymal hemorrhage or extra-axial fluid collection. No mass lesion, mass effect, or midline shift.  No CT evidence of acute  infarction.  Encephalomalacic changes/hypodensity in the left frontoparietal region nodes (series 3/ images 25 and 19), compatible with prior left MCA distribution infarct.  Subcortical white matter and periventricular small vessel ischemic changes. Intracranial atherosclerosis.  Cerebral volume is within normal limits.  No ventriculomegaly.  New complete opacification of the left maxillary sinus. Mastoid air cells are clear.  No evidence of calvarial fracture.  IMPRESSION: No evidence of acute intracranial abnormality.  Encephalomalacic changes related to old left MCA distribution infarct.  Small vessel ischemic changes.   Electronically Signed   By: Julian Hy M.D.   On: 01/01/2015 19:09   Dg Chest Portable 1 View  01/01/2015   CLINICAL DATA:  Altered mental status, elevated creatinine  EXAM: PORTABLE CHEST - 1 VIEW  COMPARISON:  06/10/2012  FINDINGS: Lungs are essentially clear. No focal consolidation. No pleural effusion or pneumothorax.  Cardiomegaly.  Surgical clips in the right chest wall/axilla.  IMPRESSION: No evidence of acute cardiopulmonary disease.   Electronically Signed   By: Julian Hy M.D.   On: 01/01/2015 19:25      Assessment/Plan   1. Altered mental status. The etiology may be related to dehydration. CT brain scan does not show a new stroke. There is no pneumonia on chest x-ray. She may have medication effects which are causing the problem. Her daughter is concerned that she has been receiving too much opiates. We will monitor closely. Hold sedating medications. 2. Dehydration/acute renal failure. She will be treated with intravenous fluids. Monitor renal function closely. 3. Diabetes. This is diet controlled and appears. 4. History of CVA. She is on chronic anticoagulations therapy which she will continue. 5. Hypertension. She is somewhat hypotensive and we will hold some of her antihypertensives medications.  Further recommendations will depend on patient's  hospital progress.   Code Status: Full code.   DVT Prophylaxis: Warfarin.  Family Communication: I discussed the plan with family members at the bedside.   Disposition Plan: Back to the skilled nursing home when medically stable.  Time spent: 60 minutes.  Doree Albee [facility] Pager [phone].

## 2015-01-01 NOTE — ED Provider Notes (Signed)
CSN: [id number]     Arrival date & time 01/01/15  1455 History   First MD Initiated Contact with Patient 01/01/15 1528     Chief Complaint  Patient presents with  . Fatigue     (Consider location/radiation/quality/duration/timing/severity/associated sxs/prior Treatment) HPI..... Level V caveat for altered mental status. History obtained from daughter. Daughter reports patient has  been doing fairly well at nursing home for rehabilitation purposes. She is status post a stroke in 2013 leaning to the right sided deficit. She has deteriorated over the past 2-3 days. Decreased alertness. Decreased eating. She is normally conversant and able to walk with a walker. Patient has no specific complaints.  Past Medical History  Diagnosis Date  . CHF (congestive heart failure)   . Diabetes mellitus without complication   . COPD (chronic obstructive pulmonary disease)   . Hypertension   . Asthma   . Shortness of breath   . Stroke     2013  . Anxiety   . Pneumonia   . Gout   . 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 Anticoagulant long-term use    Past Surgical History  Procedure Laterality Date  . Tubal ligation    . Colonoscopy  04/04/2002    UTM:LYYTKPTW hemorrhoids, otherwise, normal rectum/Scattered left sided diverticula/Somewhat adenomatous appearing ileocecal valve which is probably normal but this area was biopsied. The remainder of the colonic mucosa appeared   normal  . Trigger finger surgery      twice  . Colonoscopy with esophagogastroduodenoscopy (egd) N/A 02/20/2013    Procedure: COLONOSCOPY WITH ESOPHAGOGASTRODUODENOSCOPY (EGD);  Surgeon: Daneil Dolin, MD;  Location: [facility];  Service: Endoscopy;  Laterality: N/A;  . Breast surgery Left 2005    lumpectomy and removed lymph nodes   Family History  Problem Relation Age of Onset  . Colon cancer Neg Hx   . Liver disease Neg Hx   . Heart disease Mother   . Hypertension Mother   . Cancer Father     lung  . Cancer Brother     prostate  . Diabetes Brother   . Diabetes Sister   . Cancer Sister     breast cancer  . Hypertension Sister   . Diabetes Sister   . Hypertension Sister   . Diabetes Sister   . Hypertension Sister   . Hypertension Sister   . Cancer Brother   . Diabetes Brother   . Diabetes Brother    Social History  Substance Use Topics  . Smoking status: Never Smoker   . Smokeless tobacco: Never Used  . Alcohol Use: No   OB History    No data available     Review of Systems  Unable to perform ROS: Mental status change      Allergies  Ultram  Home Medications   Prior to Admission medications   Medication Sig Start Date End Date Taking? Authorizing Provider  allopurinol (ZYLOPRIM) 300 MG tablet TAKE ONE (1) TABLET EACH DAY 10/08/14  Yes [name], MD  Amino Acids-Protein Hydrolys (FEEDING SUPPLEMENT, PRO-STAT SUGAR FREE 64,) LIQD Take 30 mLs by mouth 2 (two) times daily.   Yes Historical Provider, MD  atorvastatin (LIPITOR) 40 MG tablet TAKE ONE (1) TABLET EACH DAY 11/30/14  Yes [name], MD  baclofen (LIORESAL) 10 MG tablet TAKE 1/2 TABLET 3 TIMES DAILY 11/30/14  Yes [name], MD  feeding supplement (GLUCERNA SHAKE) LIQD Take 237 mLs by  mouth 3 (three) times daily between meals. Patient taking differently: Take 120 mLs by mouth See admin instructions. Given with medications per patient request per [facility] 06/12/12  Yes Bobby Rumpf York, PA-C  furosemide (LASIX) 20 MG tablet Take 20 mg by mouth every other day.  12/02/12  Yes Vernie Shanks, MD  gabapentin (NEURONTIN) 100 MG capsule Take 100 mg by mouth 2 (two) times daily.   Yes Historical Provider, MD  gabapentin (NEURONTIN) 400 MG capsule TAKE 1 CAPSULE DAILY EVERY EVENING 10/08/14  Yes [name], MD  HYDROcodone-acetaminophen [facility]) 7.5-325 MG per tablet Take one tablet by mouth twice daily as needed for pain. Do not exceed 3gm of APAP/24 form all sources. Patient taking differently:  Take 1 tablet by mouth 2 (two) times daily as needed for moderate pain or severe pain. Take one tablet by mouth twice daily as needed for pain. Do not exceed 3gm of APAP/24 form all sources. 12/28/14  Yes Tiffany L Reed, DO  hydrocortisone (ANUSOL-HC) 2.5 % rectal cream Place 1 application rectally 4 (four) times daily as needed for hemorrhoids or itching.   Yes Historical Provider, MD  loratadine (CLARITIN) 10 MG tablet TAKE ONE (1) TABLET EACH DAY 07/06/14  Yes [name], MD  losartan (COZAAR) 25 MG tablet TAKE ONE (1) TABLET EACH DAY 11/30/14  Yes [name], MD  metFORMIN (GLUCOPHAGE) 500 MG tablet TAKE ONE TABLET TWICE A DAY WITH FOOD 11/30/14  Yes [name], MD  metoprolol tartrate (LOPRESSOR) 25 MG tablet TAKE 1/2 TABLET TWICE DAILY 11/30/14  Yes [name], MD  montelukast (SINGULAIR) 10 MG tablet TAKE 1 TABLET DAILY IN THE EVENING 11/30/14  Yes [name], MD  warfarin (COUMADIN) 4 MG tablet Take 4 mg by mouth every evening.   Yes Historical Provider, MD   BP 113/64 mmHg  Pulse 85  Temp(Src) 98.3 F (36.8 C) (Oral)  Resp 20  Ht 5\' 5"  (1.651 m)  Wt 225 lb (102.059 kg)  BMI 37.44 kg/m2  SpO2 94% Physical Exam  Constitutional:  Obese, sleepy, opens eyes but mumbles answers to questions  HENT:  Head: Normocephalic and atraumatic.  Eyes: Conjunctivae and EOM are normal. Pupils are equal, round, and reactive to light.  Neck: Normal range of motion. Neck supple.  Cardiovascular: Normal rate and regular rhythm.   Pulmonary/Chest: Effort normal and breath sounds normal.  Abdominal: Soft. Bowel sounds are normal.  Musculoskeletal:  unable  Neurological:  unable  Skin: Skin is warm and dry.  Psychiatric:  Flat affect  Nursing note and vitals reviewed.   ED Course  Procedures (including critical care time) Labs Review Labs Reviewed  BASIC METABOLIC PANEL - Abnormal; Notable for the following:    Sodium 134 (*)    Chloride 98 (*)    BUN 63 (*)     Creatinine, Ser 1.86 (*)    GFR calc non Af Amer 26 (*)    GFR calc Af Amer 30 (*)    All other components within normal limits  CBC - Abnormal; Notable for the following:    RBC 3.60 (*)    Hemoglobin 10.0 (*)    HCT 30.0 (*)    RDW 17.5 (*)    Platelets 79 (*)    All other components within normal limits  URINALYSIS, ROUTINE W REFLEX MICROSCOPIC (NOT AT [facility]) - Abnormal; Notable for the following:    Specific Gravity, Urine >1.030 (*)    Bilirubin Urine SMALL (*)  All other components within normal limits  C DIFFICILE QUICK SCREEN W PCR REFLEX    Imaging Review No results found. I, Nakeya Adinolfi, personally reviewed and evaluated these images and lab results as part of my medical decision-making.   EKG Interpretation   Date/Time:  Friday January 01 2015 15:18:51 EDT Ventricular Rate:  83 PR Interval:  152 QRS Duration: 107 QT Interval:  370 QTC Calculation: 435 R Axis:   -34 Text Interpretation:  Sinus rhythm Abnormal R-wave progression, early  transition Left ventricular hypertrophy Borderline ST elevation, lateral  leads Confirmed by Lacinda Axon  MD, Colleen Kotlarz ([id number]) on 01/01/2015 3:32:19 PM      MDM   Final diagnoses:  Altered mental status, unspecified altered mental status type    Uncertain etiology of patient's altered mental status. Creatinine noted to be elevated at 1.86. Specific gravity greater than 1.03. Will hydrate. CT head pending. Admit to general medicine.    Nat Christen, MD 01/01/15 [phone]

## 2015-01-01 NOTE — Progress Notes (Signed)
Patient ID: Brittany Clarke, female   DOB: 1940-10-17, 74 y.o.   MRN: [id number]  This encounter was created in error - please disregard.

## 2015-01-02 DIAGNOSIS — I10 Essential (primary) hypertension: Secondary | ICD-10-CM

## 2015-01-02 DIAGNOSIS — E860 Dehydration: Secondary | ICD-10-CM

## 2015-01-02 DIAGNOSIS — G9340 Encephalopathy, unspecified: Secondary | ICD-10-CM

## 2015-01-02 LAB — COMPREHENSIVE METABOLIC PANEL
ALK PHOS: 68 U/L (ref 38–126)
ALT: 14 U/L (ref 14–54)
AST: 34 U/L (ref 15–41)
Albumin: 3.1 g/dL — ABNORMAL LOW (ref 3.5–5.0)
Anion gap: 11 (ref 5–15)
BILIRUBIN TOTAL: 1.5 mg/dL — AB (ref 0.3–1.2)
BUN: 48 mg/dL — ABNORMAL HIGH (ref 6–20)
CO2: 22 mmol/L (ref 22–32)
Calcium: 9.5 mg/dL (ref 8.9–10.3)
Chloride: 106 mmol/L (ref 101–111)
Creatinine, Ser: 1.12 mg/dL — ABNORMAL HIGH (ref 0.44–1.00)
GFR calc Af Amer: 55 mL/min — ABNORMAL LOW (ref >60)
GFR calc non Af Amer: 48 mL/min — ABNORMAL LOW (ref >60)
GLUCOSE: 96 mg/dL (ref 65–99)
POTASSIUM: 4.1 mmol/L (ref 3.5–5.1)
SODIUM: 139 mmol/L (ref 135–145)
TOTAL PROTEIN: 6.2 g/dL — AB (ref 6.5–8.1)

## 2015-01-02 LAB — CBC
HEMATOCRIT: 27.3 % — AB (ref 36.0–46.0)
Hemoglobin: 9 g/dL — ABNORMAL LOW (ref 12.0–15.0)
MCH: 27.4 pg (ref 26.0–34.0)
MCHC: 33 g/dL (ref 30.0–36.0)
MCV: 83.2 fL (ref 78.0–100.0)
Platelets: 94 10*3/uL — ABNORMAL LOW (ref 150–400)
RBC: 3.28 MIL/uL — ABNORMAL LOW (ref 3.87–5.11)
RDW: 17.5 % — AB (ref 11.5–15.5)
WBC: 6 10*3/uL (ref 4.0–10.5)

## 2015-01-02 LAB — PROTIME-INR
INR: 3.45 — ABNORMAL HIGH (ref 0.00–1.49)
PROTHROMBIN TIME: 34 s — AB (ref 11.6–15.2)

## 2015-01-02 NOTE — Progress Notes (Signed)
[facility] PROGRESS NOTE  Brittany Clarke YHC:[id number] DOB: 05/16/1941 DOA: 01/01/2015 PCP: [name], MD  Assessment/Plan: 1. Acute encephalopathy. Possible related to dehydration/medications. CT brain shows no evidence of new CVA. CXR shows no evidence of Pneumonia.  Will continue to monitor medications and continue to hold sedating medications. Weaned off oxygen, clinically appears to be improving  2. ARF. Likely due to dehydration/ace inhibitor. Improving with IVF. Creatinine has trended down since admission. Monitor renal function. Continue to hold lasix and ace inhibitors for now  3. DM. Stable, continue SSI 4. Hx of CVA. Continue anticoagulation therapy 5. HTN. Pt was found to be hypotensive on admission. Blood pressure improved with IVF. Will resume antihypertensive medications as tolerated. Will monitor  6. HLD. Continue statin  7. Deconditioning. Will request PT consult   Code Status: FULL DVT prophylaxis:  Warfarin  Family Communication: family at bedside, care plan was discussed and there are no questions at this time Disposition Plan: Transfer to telemetry    Consultants:  PT  Procedures:    Antibiotics:    HPI/Subjective: She reports no SOB, cough, n/v/d, abdominal pain.  She denies pain overall. She reports being ambulatory at [facility] yesterday. Her family, bedside, reports her being confused at [facility] yesterday    Objective: Filed Vitals:   01/02/15 0700  BP: 118/58  Pulse: 87  Temp:   Resp: 14    Intake/Output Summary (Last 24 hours) at 01/02/15 0721 Last data filed at 01/01/15 1519  Gross per 24 hour  Intake      0 ml  Output    150 ml  Net   -150 ml   Filed Weights   01/01/15 1508 01/01/15 2102 01/02/15 0500  Weight: 102.059 kg (225 lb) 101.5 kg (223 lb 12.3 oz) 101.9 kg (224 lb 10.4 oz)    Exam:   General:  Appears calm and comfortable, afebrile, NAD, lying in bed  Cardiovascular: RRR, no m/r/g, s1,  s2  Respiratory: CTAB, no w/r/r, normal work of breathing   Abdomen: soft, ntnd, normal bowel sounds  Musculoskeletal: no LE edema  Data Reviewed: Basic Metabolic Panel:  Recent Labs Lab 12/28/14 1430 01/01/15 0700 01/01/15 1645 01/02/15 0636  NA 138 134* 134* 139  K 4.2 3.9 4.3 4.1  CL 103 98* 98* 106  CO2 25 25 23 22   GLUCOSE 84 104* 97 96  BUN 27* 57* 63* 48*  CREATININE 0.82 1.69* 1.86* 1.12*  CALCIUM 9.7 9.5 9.8 9.5   Liver Function Tests:  Recent Labs Lab 12/28/14 1430 01/01/15 0700 01/02/15 0636  AST 31 33 34  ALT 13* 14 14  ALKPHOS 72 58 68  BILITOT 1.3* 1.3* 1.5*  PROT 6.1* 6.0* 6.2*  ALBUMIN 3.2* 3.1* 3.1*   No results for input(s): LIPASE, AMYLASE in the last 168 hours. No results for input(s): AMMONIA in the last 168 hours. CBC:  Recent Labs Lab 12/28/14 1430 12/30/14 0755 01/01/15 0700 01/01/15 1529 01/02/15 0636  WBC 5.3 6.1 6.0 7.2 6.0  NEUTROABS 2.1  --  2.1  --   --   HGB 9.9* 9.9* 9.2* 10.0* 9.0*  HCT 30.5* 30.7* 28.3* 30.0* 27.3*  MCV 84.5 84.1 83.5 83.3 83.2  PLT 75* 75* 87* 79* 94*   Cardiac Enzymes: No results for input(s): CKTOTAL, CKMB, CKMBINDEX, TROPONINI in the last 168 hours. BNP (last 3 results) No results for input(s): BNP in the last 8760 hours.  ProBNP (last 3 results) No results for input(s): PROBNP in the  last 8760 hours.  CBG: No results for input(s): GLUCAP in the last 168 hours.  Recent Results (from the past 240 hour(s))  C difficile quick scan w PCR reflex     Status: None   Collection Time: 01/01/15  3:06 PM  Result Value Ref Range Status   C Diff antigen NEGATIVE NEGATIVE Final   C Diff toxin NEGATIVE NEGATIVE Final   C Diff interpretation Negative for toxigenic C. difficile  Final     Studies: Ct Head Wo Contrast  01/01/2015   CLINICAL DATA:  Altered mental status  EXAM: CT HEAD WITHOUT CONTRAST  TECHNIQUE: Contiguous axial images were obtained from the base of the skull through the vertex  without intravenous contrast.  COMPARISON:  None.  FINDINGS: No evidence of parenchymal hemorrhage or extra-axial fluid collection. No mass lesion, mass effect, or midline shift.  No CT evidence of acute infarction.  Encephalomalacic changes/hypodensity in the left frontoparietal region nodes (series 3/ images 25 and 19), compatible with prior left MCA distribution infarct.  Subcortical white matter and periventricular small vessel ischemic changes. Intracranial atherosclerosis.  Cerebral volume is within normal limits.  No ventriculomegaly.  New complete opacification of the left maxillary sinus. Mastoid air cells are clear.  No evidence of calvarial fracture.  IMPRESSION: No evidence of acute intracranial abnormality.  Encephalomalacic changes related to old left MCA distribution infarct.  Small vessel ischemic changes.   Electronically Signed   By: Julian Hy M.D.   On: 01/01/2015 19:09   Dg Chest Portable 1 View  01/01/2015   CLINICAL DATA:  Altered mental status, elevated creatinine  EXAM: PORTABLE CHEST - 1 VIEW  COMPARISON:  06/10/2012  FINDINGS: Lungs are essentially clear. No focal consolidation. No pleural effusion or pneumothorax.  Cardiomegaly.  Surgical clips in the right chest wall/axilla.  IMPRESSION: No evidence of acute cardiopulmonary disease.   Electronically Signed   By: Julian Hy M.D.   On: 01/01/2015 19:25    Scheduled Meds: . allopurinol  300 mg Oral Daily  . atorvastatin  40 mg Oral q1800  . feeding supplement (GLUCERNA SHAKE)  237 mL Oral TID BM  . feeding supplement (PRO-STAT SUGAR FREE 64)  30 mL Oral BID  . gabapentin  100 mg Oral BID  . metoprolol tartrate  12.5 mg Oral BID  . montelukast  10 mg Oral QPM  . sodium chloride  3 mL Intravenous Q12H  . Warfarin - Pharmacist Dosing Inpatient   Does not apply Q24H   Continuous Infusions: . sodium chloride      Active Problems:   HTN (hypertension)   DM (diabetes mellitus)   COPD (chronic obstructive  pulmonary disease)   Chronic anticoagulation   Altered mental status   Dehydration    Time spent: 30 minutes     [name], M.D  [facility] Pager [phone]. If 7PM-7AM, please contact night-coverage at [email or web address], password [facility] 01/02/2015, 7:21 AM  LOS: 1 day     I, Arielle Khosrowpour, acting a scribe, recorded this note contemporaneously in the presence of Dr. [name], M.D. On 01/02/2015 at 7:21 AM  I have reviewed the above documentation for accuracy and completeness, and I agree with the above.  MEMON,JEHANZEB

## 2015-01-02 NOTE — Progress Notes (Signed)
ANTICOAGULATION CONSULT NOTE - follow up  Pharmacy Consult for Coumadin (chronic Rx PTA) Indication: stroke  Allergies  Allergen Reactions  . Ultram [Tramadol] Shortness Of Breath    Patient Measurements: Height: 5\' 5"  (165.1 cm) Weight: 224 lb 10.4 oz (101.9 kg) IBW/kg (Calculated) : 57  Vital Signs: Temp: 98.9 F (37.2 C) (08/13 0723) Temp Source: Oral (08/13 0723) BP: 118/58 mmHg (08/13 0700) Pulse Rate: 87 (08/13 0700)  Labs:  Recent Labs  12/31/14 0755  01/01/15 0700 01/01/15 1529 01/01/15 1645 01/02/15 0636  HGB  --   < > 9.2* 10.0*  --  9.0*  HCT  --   --  28.3* 30.0*  --  27.3*  PLT  --   --  87* 79*  --  94*  LABPROT 17.6*  --  24.2*  --   --  34.0*  INR 1.43  --  2.19*  --   --  3.45*  CREATININE  --   --  1.69*  --  1.86* 1.12*  < > = values in this interval not displayed.  Estimated Creatinine Clearance: 53 mL/min (by C-G formula based on Cr of 1.12).   Medical History: Past Medical History  Diagnosis Date  . CHF (congestive heart failure)   . Diabetes mellitus without complication   . COPD (chronic obstructive pulmonary disease)   . Hypertension   . Asthma   . Shortness of breath   . Stroke     2013  . Anxiety   . Pneumonia   . Gout   . 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 Anticoagulant long-term use     Medications:  See PTA med list  Assessment: 74yo female on chronic Coumadin PTA.  INR therapeutic on admission but has now trended up to SUPRAtherapeutic range.  Home dose reportedly 4mg  daily.  Increase in INR could be due to acute illness.  CBC appears stable.  Goal of Therapy:  INR 2-3 Monitor platelets by anticoagulation protocol: Yes   Plan:  HOLD coumadin today INR daily  Hart Robinsons A 01/02/2015,8:19 AM

## 2015-01-02 NOTE — Progress Notes (Signed)
Utilization review Completed Saidi Santacroce RN BSN   

## 2015-01-02 NOTE — Progress Notes (Signed)
Gave report to  [facility] on 300 who will be taking care of patient in room 305

## 2015-01-03 DIAGNOSIS — K921 Melena: Secondary | ICD-10-CM | POA: Diagnosis present

## 2015-01-03 DIAGNOSIS — J449 Chronic obstructive pulmonary disease, unspecified: Secondary | ICD-10-CM

## 2015-01-03 DIAGNOSIS — D649 Anemia, unspecified: Secondary | ICD-10-CM

## 2015-01-03 DIAGNOSIS — D696 Thrombocytopenia, unspecified: Secondary | ICD-10-CM

## 2015-01-03 LAB — BASIC METABOLIC PANEL
Anion gap: 7 (ref 5–15)
BUN: 25 mg/dL — AB (ref 6–20)
CHLORIDE: 108 mmol/L (ref 101–111)
CO2: 24 mmol/L (ref 22–32)
Calcium: 9.9 mg/dL (ref 8.9–10.3)
Creatinine, Ser: 0.6 mg/dL (ref 0.44–1.00)
GFR calc Af Amer: >60 mL/min (ref >60)
GFR calc non Af Amer: >60 mL/min (ref >60)
Glucose, Bld: 113 mg/dL — ABNORMAL HIGH (ref 65–99)
POTASSIUM: 3.8 mmol/L (ref 3.5–5.1)
Sodium: 139 mmol/L (ref 135–145)

## 2015-01-03 LAB — CBC
HCT: 27.5 % — ABNORMAL LOW (ref 36.0–46.0)
HEMOGLOBIN: 8.9 g/dL — AB (ref 12.0–15.0)
MCH: 27.4 pg (ref 26.0–34.0)
MCHC: 32.4 g/dL (ref 30.0–36.0)
MCV: 84.6 fL (ref 78.0–100.0)
Platelets: 82 10*3/uL — ABNORMAL LOW (ref 150–400)
RBC: 3.25 MIL/uL — ABNORMAL LOW (ref 3.87–5.11)
RDW: 18.1 % — AB (ref 11.5–15.5)
WBC: 5 10*3/uL (ref 4.0–10.5)

## 2015-01-03 LAB — GLUCOSE, CAPILLARY
Glucose-Capillary: 135 mg/dL — ABNORMAL HIGH (ref 65–99)
Glucose-Capillary: 92 mg/dL (ref 65–99)

## 2015-01-03 LAB — PROTIME-INR
INR: 4.03 — ABNORMAL HIGH (ref 0.00–1.49)
PROTHROMBIN TIME: 38.2 s — AB (ref 11.6–15.2)

## 2015-01-03 LAB — MRSA PCR SCREENING: MRSA BY PCR: NEGATIVE

## 2015-01-03 MED ORDER — POLYETHYLENE GLYCOL 3350 17 G PO PACK
17.0000 g | PACK | Freq: Every day | ORAL | Status: DC
  Administered 2015-01-04 – 2015-01-09 (×6): 17 g via ORAL
  Filled 2015-01-03 (×7): qty 1

## 2015-01-03 MED ORDER — FUROSEMIDE 20 MG PO TABS
20.0000 mg | ORAL_TABLET | Freq: Every day | ORAL | Status: DC
Start: 2015-01-03 — End: 2015-01-09
  Administered 2015-01-03 – 2015-01-09 (×7): 20 mg via ORAL
  Filled 2015-01-03 (×7): qty 1

## 2015-01-03 MED ORDER — DOCUSATE SODIUM 100 MG PO CAPS
100.0000 mg | ORAL_CAPSULE | Freq: Two times a day (BID) | ORAL | Status: DC
  Administered 2015-01-03 – 2015-01-09 (×10): 100 mg via ORAL
  Filled 2015-01-03 (×11): qty 1

## 2015-01-03 NOTE — Progress Notes (Signed)
[facility] PROGRESS NOTE  Brittany Clarke TKP:[id number] DOB: 1940/12/08 DOA: 01/01/2015 PCP: [name], MD  Assessment/Plan: 1. Acute encephalopathy. Possible related to dehydration/medications. CT brain shows no evidence of new CVA. CXR shows no evidence of Pneumonia.  Will continue to monitor medications and continue to hold sedating medications. Appears to have resolved and mental status back to baseline. 2. ARF. Likely due to dehydration/ace inhibitor. Resolved with IVF. Creatinine has trended down since admission and has now normalized. Will discontinue IVF and resume lasix. 3. Chronic anemia. Baseline HGB appears to be around 10 currently 8.9. She is on anticoagulation for history of stroke. She was found to have heme positive stool, no gross bleeding has been reported. Will have GI consult. She thinks she may have had a colonoscopy 02/2014 but she is unsure.  4. DM. Stable, continue SSI. 5. Hx of CVA. Patient is on coumadin and inr is supratherapeutic at 4. Coumadin dosed per pharmacy. Continue anticoagulation therapy for now. 6. HTN. Pt was found to be hypotensive on admission. Blood pressure improved with IVF. Metoprolol has been resumed but ace inhibitors are still on hold, blood pressure stable. 7. HLD. Continue Lipitor. 8. Deconditioning.   Code Status: FULL DVT prophylaxis:  Warfarin  Family Communication: Patient was alone, care plan was discussed and there are no questions at this time Disposition Plan: Family wishes to take the pt home with home services. Anticipate discharge in the next hours.    Consultants:  PT  GI  Procedures:    Antibiotics:    HPI/Subjective: Feels better the yesterday and was able to have a bowel movement yesterday. She did not notice any blood in the stool. Denies any dizziness or being light headed. No reports of chest pain, shortness of breath, n/v/d, or abd pain.  Objective: Filed Vitals:   01/03/15 0614  BP: 102/64   Pulse: 84  Temp: 99.5 F (37.5 C)  Resp: 21    Intake/Output Summary (Last 24 hours) at 01/03/15 0647 Last data filed at 01/02/15 1700  Gross per 24 hour  Intake    715 ml  Output      0 ml  Net    715 ml   Filed Weights   01/01/15 1508 01/01/15 2102 01/02/15 0500  Weight: 102.059 kg (225 lb) 101.5 kg (223 lb 12.3 oz) 101.9 kg (224 lb 10.4 oz)    Exam: General: NAD, looks comfortable sitting up in bed.  Cardiovascular: RRR, S1, S2  Respiratory:  Bilateral crackles at bases. No wheezing Abdomen: soft, non tender, no distention , bowel sounds normal Musculoskeletal:1+ pitting edema bilaterally  Data Reviewed: Basic Metabolic Panel:  Recent Labs Lab 12/28/14 1430 01/01/15 0700 01/01/15 1645 01/02/15 0636  NA 138 134* 134* 139  K 4.2 3.9 4.3 4.1  CL 103 98* 98* 106  CO2 25 25 23 22   GLUCOSE 84 104* 97 96  BUN 27* 57* 63* 48*  CREATININE 0.82 1.69* 1.86* 1.12*  CALCIUM 9.7 9.5 9.8 9.5   Liver Function Tests:  Recent Labs Lab 12/28/14 1430 01/01/15 0700 01/02/15 0636  AST 31 33 34  ALT 13* 14 14  ALKPHOS 72 58 68  BILITOT 1.3* 1.3* 1.5*  PROT 6.1* 6.0* 6.2*  ALBUMIN 3.2* 3.1* 3.1*   No results for input(s): LIPASE, AMYLASE in the last 168 hours. No results for input(s): AMMONIA in the last 168 hours. CBC:  Recent Labs Lab 12/28/14 1430 12/30/14 0755 01/01/15 0700 01/01/15 1529 01/02/15 0636  WBC 5.3 6.1 6.0  7.2 6.0  NEUTROABS 2.1  --  2.1  --   --   HGB 9.9* 9.9* 9.2* 10.0* 9.0*  HCT 30.5* 30.7* 28.3* 30.0* 27.3*  MCV 84.5 84.1 83.5 83.3 83.2  PLT 75* 75* 87* 79* 94*   Cardiac Enzymes: No results for input(s): CKTOTAL, CKMB, CKMBINDEX, TROPONINI in the last 168 hours. BNP (last 3 results) No results for input(s): BNP in the last 8760 hours.  ProBNP (last 3 results) No results for input(s): PROBNP in the last 8760 hours.  CBG: No results for input(s): GLUCAP in the last 168 hours.  Recent Results (from the past 240 hour(s))  C  difficile quick scan w PCR reflex     Status: None   Collection Time: 01/01/15  3:06 PM  Result Value Ref Range Status   C Diff antigen NEGATIVE NEGATIVE Final   C Diff toxin NEGATIVE NEGATIVE Final   C Diff interpretation Negative for toxigenic C. difficile  Final  MRSA PCR Screening     Status: None   Collection Time: 01/02/15  9:30 PM  Result Value Ref Range Status   MRSA by PCR NEGATIVE NEGATIVE Final    Comment:        The GeneXpert MRSA Assay (FDA approved for NASAL specimens only), is one component of a comprehensive MRSA colonization surveillance program. It is not intended to diagnose MRSA infection nor to guide or monitor treatment for MRSA infections.      Studies: Ct Head Wo Contrast  01/01/2015   CLINICAL DATA:  Altered mental status  EXAM: CT HEAD WITHOUT CONTRAST  TECHNIQUE: Contiguous axial images were obtained from the base of the skull through the vertex without intravenous contrast.  COMPARISON:  None.  FINDINGS: No evidence of parenchymal hemorrhage or extra-axial fluid collection. No mass lesion, mass effect, or midline shift.  No CT evidence of acute infarction.  Encephalomalacic changes/hypodensity in the left frontoparietal region nodes (series 3/ images 25 and 19), compatible with prior left MCA distribution infarct.  Subcortical white matter and periventricular small vessel ischemic changes. Intracranial atherosclerosis.  Cerebral volume is within normal limits.  No ventriculomegaly.  New complete opacification of the left maxillary sinus. Mastoid air cells are clear.  No evidence of calvarial fracture.  IMPRESSION: No evidence of acute intracranial abnormality.  Encephalomalacic changes related to old left MCA distribution infarct.  Small vessel ischemic changes.   Electronically Signed   By: Julian Hy M.D.   On: 01/01/2015 19:09   Dg Chest Portable 1 View  01/01/2015   CLINICAL DATA:  Altered mental status, elevated creatinine  EXAM: PORTABLE CHEST - 1  VIEW  COMPARISON:  06/10/2012  FINDINGS: Lungs are essentially clear. No focal consolidation. No pleural effusion or pneumothorax.  Cardiomegaly.  Surgical clips in the right chest wall/axilla.  IMPRESSION: No evidence of acute cardiopulmonary disease.   Electronically Signed   By: Julian Hy M.D.   On: 01/01/2015 19:25    Scheduled Meds: . allopurinol  300 mg Oral Daily  . atorvastatin  40 mg Oral q1800  . feeding supplement (GLUCERNA SHAKE)  237 mL Oral TID BM  . feeding supplement (PRO-STAT SUGAR FREE 64)  30 mL Oral BID  . gabapentin  100 mg Oral BID  . metoprolol tartrate  12.5 mg Oral BID  . montelukast  10 mg Oral QPM  . sodium chloride  3 mL Intravenous Q12H  . Warfarin - Pharmacist Dosing Inpatient   Does not apply Q24H   Continuous Infusions: .  sodium chloride 50 mL/hr at 01/02/15 2211    Active Problems:   HTN (hypertension)   DM (diabetes mellitus)   COPD (chronic obstructive pulmonary disease)   Chronic anticoagulation   Altered mental status   Dehydration    Time spent: 25 minutes     [name], M.D  [facility] Pager [phone]. If 7PM-7AM, please contact night-coverage at [email or web address], password [facility] 01/03/2015, 6:47 AM  LOS: 2 days     I, Jessica D. Leonie Green, acting as scribe, recorded this note contemporaneously in the presence of Dr. [name], M.D. on 01/03/2015.  I have reviewed the above documentation for accuracy and completeness, and I agree with the above.  Gerrit Heck

## 2015-01-03 NOTE — Consult Note (Addendum)
Referring Brittany Clarke: No ref. Philippe Gang found Primary Care Physician:  [name], MD Primary Gastroenterologist:  Dr. Gala Romney  Reason for Consultation:  Blood in stool.  HPI: Pleasant 74 year old lady with multiple medical problems including CVA, congestive heart failure and hypertension now admitted from the nursing home with decline in mental status and acute renal failure.  Hemoglobin this morning 8.9;  was in the 11 range last month. Hemoccult-positive. On Coumadin secondary to CVA. INR 4 today. Patient reports seeing intermittent blood in the toilet bowl with normal-appearing stool and occasionally wipes a little blood on the toilet tissue. Denies melena. Patient states she takes MiraLAX daily to achieve daily bowel function. Denies having to strain. No associated abdominal pain or hematemesis.  Denies GERD, odynophagia or early satiety nausea or vomiting. Denies abdominal pain.  Rarely gets "choked" trying to swallow her meals. She is on a regular heart healthy diet at this time and seems to be tolerating it well.  Patient evaluated for dysphagia and hematochezia 2014. Colonoscopy demonstrated prominent diverticulosis with some suspicion for diverticular etiology but no other findings. EGD for dysphagia revealed H. 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  She was treated with Pylera.  Past Medical History  Diagnosis Date  . CHF (congestive heart failure)   . Diabetes mellitus without complication   . COPD (chronic obstructive pulmonary disease)   . Hypertension   . Asthma   . Shortness of breath   . Stroke     2013  . Anxiety   . Pneumonia   . Gout   . 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 Anticoagulant long-term use     Past Surgical History  Procedure Laterality Date  . Tubal ligation    . Colonoscopy  04/04/2002    OZY:YQMGNOIB hemorrhoids, otherwise, normal rectum/Scattered left sided diverticula/Somewhat  adenomatous appearing ileocecal valve which is probably normal but this area was biopsied. The remainder of the colonic mucosa appeared   normal  . Trigger finger surgery      twice  . Colonoscopy with esophagogastroduodenoscopy (egd) N/A 02/20/2013    Procedure: COLONOSCOPY WITH ESOPHAGOGASTRODUODENOSCOPY (EGD);  Surgeon: Daneil Dolin, MD;  Location: [facility];  Service: Endoscopy;  Laterality: N/A;  . Breast surgery Left 2005    lumpectomy and removed lymph nodes    Prior to Admission medications   Medication Sig Start Date End Date Taking? Authorizing Jordi Lacko  allopurinol (ZYLOPRIM) 300 MG tablet TAKE ONE (1) TABLET EACH DAY 10/08/14  Yes [name], MD  Amino Acids-Protein Hydrolys (FEEDING SUPPLEMENT, PRO-STAT SUGAR FREE 64,) LIQD Take 30 mLs by mouth 2 (two) times daily.   Yes Historical Shavonna Corella, MD  atorvastatin (LIPITOR) 40 MG tablet TAKE ONE (1) TABLET EACH DAY 11/30/14  Yes [name], MD  baclofen (LIORESAL) 10 MG tablet TAKE 1/2 TABLET 3 TIMES DAILY 11/30/14  Yes [name], MD  feeding supplement (GLUCERNA SHAKE) LIQD Take 237 mLs by mouth 3 (three) times daily between meals. Patient taking differently: Take 120 mLs by mouth See admin instructions. Given with medications per patient request per [facility] 06/12/12  Yes Bobby Rumpf York, PA-C  furosemide (LASIX) 20 MG tablet Take 20 mg by mouth every other day.  12/02/12  Yes Vernie Shanks, MD  gabapentin (NEURONTIN) 100 MG capsule Take 100 mg by mouth 2 (two) times daily.   Yes Historical Jailah Willis, MD  gabapentin (NEURONTIN) 400 MG capsule  TAKE 1 CAPSULE DAILY EVERY EVENING 10/08/14  Yes [name], MD  HYDROcodone-acetaminophen [facility]) 7.5-325 MG per tablet Take one tablet by mouth twice daily as needed for pain. Do not exceed 3gm of APAP/24 form all sources. Patient taking differently: Take 1 tablet by mouth 2 (two) times daily as needed for moderate pain or severe pain. Take one tablet by mouth twice daily as  needed for pain. Do not exceed 3gm of APAP/24 form all sources. 12/28/14  Yes Tiffany L Reed, DO  hydrocortisone (ANUSOL-HC) 2.5 % rectal cream Place 1 application rectally 4 (four) times daily as needed for hemorrhoids or itching.   Yes Historical Geza Beranek, MD  loratadine (CLARITIN) 10 MG tablet TAKE ONE (1) TABLET EACH DAY 07/06/14  Yes [name], MD  losartan (COZAAR) 25 MG tablet TAKE ONE (1) TABLET EACH DAY 11/30/14  Yes [name], MD  metFORMIN (GLUCOPHAGE) 500 MG tablet TAKE ONE TABLET TWICE A DAY WITH FOOD 11/30/14  Yes [name], MD  metoprolol tartrate (LOPRESSOR) 25 MG tablet TAKE 1/2 TABLET TWICE DAILY 11/30/14  Yes [name], MD  montelukast (SINGULAIR) 10 MG tablet TAKE 1 TABLET DAILY IN THE EVENING 11/30/14  Yes [name], MD  warfarin (COUMADIN) 4 MG tablet Take 4 mg by mouth every evening.   Yes Historical Sherrica Niehaus, MD    Current Facility-Administered Medications  Medication Dose Route Frequency Emelly Wurtz Last Rate Last Dose  . 0.9 %  sodium chloride infusion   Intravenous Continuous [name], MD 50 mL/hr at 01/02/15 2211    . allopurinol (ZYLOPRIM) tablet 300 mg  300 mg Oral Daily Nimish C Anastasio Champion, MD   300 mg at 01/03/15 0915  . atorvastatin (LIPITOR) tablet 40 mg  40 mg Oral q1800 Nimish C Anastasio Champion, MD   40 mg at 01/02/15 1702  . feeding supplement (GLUCERNA SHAKE) (GLUCERNA SHAKE) liquid 237 mL  237 mL Oral TID BM Nimish C Gosrani, MD   237 mL at 01/02/15 2000  . feeding supplement (PRO-STAT SUGAR FREE 64) liquid 30 mL  30 mL Oral BID Nimish C Gosrani, MD   30 mL at 01/02/15 2212  . furosemide (LASIX) tablet 20 mg  20 mg Oral Daily [name], MD      . gabapentin (NEURONTIN) capsule 100 mg  100 mg Oral BID Doree Albee, MD   100 mg at 01/03/15 0916  . HYDROcodone-acetaminophen (NORCO) 7.5-325 MG per tablet 1 tablet  1 tablet Oral BID PRN Doree Albee, MD   1 tablet at 01/02/15 1438  . hydrocortisone (ANUSOL-HC) 2.5 % rectal cream 1  application  1 application Rectal QID PRN Nimish Luther Parody, MD      . loratadine (CLARITIN) tablet 10 mg  10 mg Oral Daily PRN Nimish C Gosrani, MD      . metoprolol tartrate (LOPRESSOR) tablet 12.5 mg  12.5 mg Oral BID Nimish C Anastasio Champion, MD   12.5 mg at 01/03/15 0915  . montelukast (SINGULAIR) tablet 10 mg  10 mg Oral QPM Nimish C Gosrani, MD   10 mg at 01/02/15 1702  . ondansetron (ZOFRAN) tablet 4 mg  4 mg Oral Q6H PRN Nimish Luther Parody, MD       Or  . ondansetron (ZOFRAN) injection 4 mg  4 mg Intravenous Q6H PRN Nimish C Gosrani, MD      . sodium chloride 0.9 % injection 3 mL  3 mL Intravenous Q12H Nimish Luther Parody, MD   3 mL  at 01/02/15 2200  . Warfarin - Pharmacist Dosing Inpatient   Does not apply Q24H Doree Albee, MD   Stopped at 01/02/15 1600    Allergies as of 01/01/2015 - Review Complete 01/01/2015  Allergen Reaction Noted  . Ultram [tramadol] Shortness Of Breath 06/10/2012    Family History  Problem Relation Age of Onset  . Colon cancer Neg Hx   . Liver disease Neg Hx   . Heart disease Mother   . Hypertension Mother   . Cancer Father     lung  . Cancer Brother     prostate  . Diabetes Brother   . Diabetes Sister   . Cancer Sister     breast cancer  . Hypertension Sister   . Diabetes Sister   . Hypertension Sister   . Diabetes Sister   . Hypertension Sister   . Hypertension Sister   . Cancer Brother   . Diabetes Brother   . Diabetes Brother     Social History   Social History  . Marital Status: Legally Separated    Spouse Name: N/A  . Number of Children: 5  . Years of Education: N/A   Occupational History  . Not on file.   Social History Main Topics  . Smoking status: Never Smoker   . Smokeless tobacco: Never Used  . Alcohol Use: No  . Drug Use: No  . Sexual Activity: No   Other Topics Concern  . Not on file   Social History Narrative    Review of Systems: Gen: Denies any fever, weight loss, and sleep disorder CV: Denies chest pain,  angina, palpitations, syncope,  Resp: Denies wheezing, coughing up blood, and pleurisy. GI: Denies vomiting blood, jaundice, and fecal incontinence.   Denies odynophagia.   Physical Exam: Vital signs in last 24 hours: Temp:  [98.3 F (36.8 C)-99.5 F (37.5 C)] 99.5 F (37.5 C) (08/14 0614) Pulse Rate:  [68-95] 84 (08/14 0614) Resp:  [16-21] 21 (08/14 0614) BP: (89-117)/(42-64) 102/64 mmHg (08/14 0614) SpO2:  [96 %-99 %] 96 % (08/14 0614) Last BM Date: 01/02/15 General:   pleasant and cooperative in NAD.  Neck:  Supple; no masses or thyromegaly. Lungs:  Clear throughout to auscultation.   No wheezes, crackles, or rhonchi. No acute distress. Heart:  Regular rate and rhythm; no murmurs, clicks, rubs,  or gallops. Abdomen:  Obese. Positive bowel sounds. Soft and nontender without appreciable mass or organomegaly.  Pulses:  Normal pulses noted. Extremities:  Without clubbing or edema.  Intake/Output from previous day: 08/13 0701 - 08/14 0700 In: 715 [P.O.:240; I.V.:475] Out: -  Intake/Output this shift:    Lab Results:  Recent Labs  01/01/15 1529 01/02/15 0636 01/03/15 0554  WBC 7.2 6.0 5.0  HGB 10.0* 9.0* 8.9*  HCT 30.0* 27.3* 27.5*  PLT 79* 94* 82*   BMET  Recent Labs  01/01/15 1645 01/02/15 0636 01/03/15 0554  NA 134* 139 139  K 4.3 4.1 3.8  CL 98* 106 108  CO2 23 22 24   GLUCOSE 97 96 113*  BUN 63* 48* 25*  CREATININE 1.86* 1.12* 0.60  CALCIUM 9.8 9.5 9.9   LFT  Recent Labs  01/02/15 0636  PROT 6.2*  ALBUMIN 3.1*  AST 34  ALT 14  ALKPHOS 68  BILITOT 1.5*   PT/INR  Recent Labs  01/02/15 0636 01/03/15 0554  LABPROT 34.0* 38.2*  INR 3.45* 4.03*    Impression:  Pleasant 74 year old lady with multiple medical problems including history of  a CVA admitted with decline in mental status in the setting of acute renal failure. Renal function improving with hydration. Decline in hemoglobin noted. Patient describes paper hematochezia in the setting of  a supratherapeutic INR.  Rare "choking episodes". Esophagus widely patent on 2014 EGD.  GI evaluation fairly recently as outlined above. Patient is chronically constipated - states MiraLAX works well for her when she takes it daily.  Recommendations:  Would strive for therapeutic INR. Continue MiraLAX 17 g orally daily. Add Colace 100 mg twice daily. Follow hemoglobin trend. Consider speech/swallowing evaluation if "choking" episodes remain an issue.  Thanks for the opportunity to see this nice lady once again. Will follow with you while she is here.   Addendum:   Nursing staff reports patient has actually been having loose, nonbloody stools over the past couple of days. C. Difficile came back negative. Therefore, we'll hold MiraLax and Colace for now.        Notice:  This dictation was prepared with Dragon dictation along with smaller phrase technology. Any transcriptional errors that result from this process are unintentional and may not be corrected upon review.

## 2015-01-04 ENCOUNTER — Inpatient Hospital Stay (HOSPITAL_COMMUNITY): Payer: Medicare Other

## 2015-01-04 ENCOUNTER — Ambulatory Visit: Payer: Medicare Other | Admitting: Family Medicine

## 2015-01-04 DIAGNOSIS — R509 Fever, unspecified: Secondary | ICD-10-CM

## 2015-01-04 DIAGNOSIS — N179 Acute kidney failure, unspecified: Secondary | ICD-10-CM | POA: Diagnosis present

## 2015-01-04 DIAGNOSIS — K921 Melena: Secondary | ICD-10-CM

## 2015-01-04 DIAGNOSIS — G9340 Encephalopathy, unspecified: Secondary | ICD-10-CM | POA: Diagnosis present

## 2015-01-04 DIAGNOSIS — D6489 Other specified anemias: Secondary | ICD-10-CM

## 2015-01-04 LAB — BASIC METABOLIC PANEL
Anion gap: 9 (ref 5–15)
BUN: 21 mg/dL — ABNORMAL HIGH (ref 6–20)
CO2: 24 mmol/L (ref 22–32)
CREATININE: 0.59 mg/dL (ref 0.44–1.00)
Calcium: 9.6 mg/dL (ref 8.9–10.3)
Chloride: 103 mmol/L (ref 101–111)
GFR calc Af Amer: >60 mL/min (ref >60)
GFR calc non Af Amer: >60 mL/min (ref >60)
Glucose, Bld: 102 mg/dL — ABNORMAL HIGH (ref 65–99)
Potassium: 3.7 mmol/L (ref 3.5–5.1)
SODIUM: 136 mmol/L (ref 135–145)

## 2015-01-04 LAB — CBC
HCT: 25.8 % — ABNORMAL LOW (ref 36.0–46.0)
Hemoglobin: 8.5 g/dL — ABNORMAL LOW (ref 12.0–15.0)
MCH: 27.5 pg (ref 26.0–34.0)
MCHC: 32.9 g/dL (ref 30.0–36.0)
MCV: 83.5 fL (ref 78.0–100.0)
PLATELETS: 84 10*3/uL — AB (ref 150–400)
RBC: 3.09 MIL/uL — ABNORMAL LOW (ref 3.87–5.11)
RDW: 18.2 % — ABNORMAL HIGH (ref 11.5–15.5)
WBC: 5.7 10*3/uL (ref 4.0–10.5)

## 2015-01-04 LAB — URINALYSIS, ROUTINE W REFLEX MICROSCOPIC
Bilirubin Urine: NEGATIVE
Glucose, UA: NEGATIVE mg/dL
Hgb urine dipstick: NEGATIVE
Ketones, ur: NEGATIVE mg/dL
Leukocytes, UA: NEGATIVE
Nitrite: NEGATIVE
Protein, ur: NEGATIVE mg/dL
Specific Gravity, Urine: 1.02 (ref 1.005–1.030)
Urobilinogen, UA: 2 mg/dL — ABNORMAL HIGH (ref 0.0–1.0)
pH: 5.5 (ref 5.0–8.0)

## 2015-01-04 LAB — PROCALCITONIN: Procalcitonin: 0.11 ng/mL

## 2015-01-04 LAB — LACTIC ACID, PLASMA
Lactic Acid, Venous: 1.9 mmol/L (ref 0.5–2.0)
Lactic Acid, Venous: 1.7 mmol/L (ref 0.5–2.0)

## 2015-01-04 LAB — PROTIME-INR
INR: 4.53 — ABNORMAL HIGH (ref 0.00–1.49)
Prothrombin Time: 41.7 seconds — ABNORMAL HIGH (ref 11.6–15.2)

## 2015-01-04 LAB — APTT: aPTT: 62 seconds — ABNORMAL HIGH (ref 24–37)

## 2015-01-04 MED ORDER — VANCOMYCIN HCL IN DEXTROSE 1-5 GM/200ML-% IV SOLN
1000.0000 mg | Freq: Two times a day (BID) | INTRAVENOUS | Status: DC
  Administered 2015-01-04 – 2015-01-06 (×5): 1000 mg via INTRAVENOUS
  Filled 2015-01-04 (×6): qty 200

## 2015-01-04 MED ORDER — PIPERACILLIN-TAZOBACTAM 3.375 G IVPB
3.3750 g | Freq: Once | INTRAVENOUS | Status: DC
  Filled 2015-01-04 (×2): qty 50

## 2015-01-04 MED ORDER — PIPERACILLIN-TAZOBACTAM 3.375 G IVPB
3.3750 g | Freq: Three times a day (TID) | INTRAVENOUS | Status: DC
  Administered 2015-01-04 – 2015-01-07 (×9): 3.375 g via INTRAVENOUS
  Filled 2015-01-04 (×9): qty 50

## 2015-01-04 MED ORDER — VANCOMYCIN HCL 10 G IV SOLR
1500.0000 mg | Freq: Once | INTRAVENOUS | Status: AC
  Administered 2015-01-04: 1500 mg via INTRAVENOUS
  Filled 2015-01-04: qty 1500

## 2015-01-04 MED ORDER — VANCOMYCIN HCL IN DEXTROSE 1-5 GM/200ML-% IV SOLN
1000.0000 mg | Freq: Once | INTRAVENOUS | Status: DC
  Filled 2015-01-04 (×2): qty 200

## 2015-01-04 MED ORDER — HYDROCORTISONE ACETATE 25 MG RE SUPP
25.0000 mg | Freq: Two times a day (BID) | RECTAL | Status: DC
  Administered 2015-01-04 – 2015-01-09 (×10): 25 mg via RECTAL
  Filled 2015-01-04 (×11): qty 1

## 2015-01-04 NOTE — Progress Notes (Signed)
ANTICOAGULATION CONSULT NOTE - follow up  Pharmacy Consult for Coumadin (chronic Rx PTA) Indication: stroke  Allergies  Allergen Reactions  . Ultram [Tramadol] Shortness Of Breath   Patient Measurements: Height: 5\' 5"  (165.1 cm) Weight: 224 lb 10.4 oz (101.9 kg) IBW/kg (Calculated) : 57  Vital Signs: Temp: 98.7 F (37.1 C) (08/15 1015) Temp Source: Oral (08/15 1015) BP: 114/47 mmHg (08/15 0627) Pulse Rate: 98 (08/15 0627)  Labs:  Recent Labs  01/02/15 0636 01/03/15 0554 01/04/15 0545 01/04/15 0720  HGB 9.0* 8.9* 8.5*  --   HCT 27.3* 27.5* 25.8*  --   PLT 94* 82* 84*  --   APTT  --   --   --  62*  LABPROT 34.0* 38.2* 41.7*  --   INR 3.45* 4.03* 4.53*  --   CREATININE 1.12* 0.60 0.59  --    Estimated Creatinine Clearance: 74.2 mL/min (by C-G formula based on Cr of 0.59).  Medical History: Past Medical History  Diagnosis Date  . CHF (congestive heart failure)   . Diabetes mellitus without complication   . COPD (chronic obstructive pulmonary disease)   . Hypertension   . Asthma   . Shortness of breath   . Stroke     2013  . Anxiety   . Pneumonia   . Gout   . 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 Anticoagulant long-term use    Medications:  See PTA med list  Assessment: 74yo female on chronic Coumadin PTA.  INR therapeutic on admission but has now trended up to SUPRAtherapeutic range.  Home dose reportedly 4mg  daily.  Increase in INR could be due to acute illness.  CBC appears stable although low.  GI is following.  No overt bleed reported.  Goal of Therapy:  INR 2-3 Monitor platelets by anticoagulation protocol: Yes   Plan:  HOLD coumadin today INR daily  [place] Crane, Denaya Horn A 01/04/2015,10:47 AM

## 2015-01-04 NOTE — Progress Notes (Signed)
    Subjective: No overt GI bleeding. No abdominal pain. Notes chronic dysphagia since stroke in 2013 but better overall. No acute changes in swallowing. Ate small amount of eggs for breakfast.   Objective: Vital signs in last 24 hours: Temp:  [99.2 F (37.3 C)-102.1 F (38.9 C)] 102.1 F (38.9 C) (08/15 0627) Pulse Rate:  [86-98] 98 (08/15 0627) Resp:  [20] 20 (08/15 0627) BP: (95-114)/(45-59) 114/47 mmHg (08/15 0627) SpO2:  [94 %-99 %] 98 % (08/15 0627) Last BM Date: 01/02/15 General:   Alert and oriented, pleasant Head:  Normocephalic and atraumatic. Eyes:  No icterus, sclera clear. Conjuctiva pink.  Mouth:  Without lesions, mucosa pink and moist. Edentulous.  Abdomen:  Bowel sounds present, soft, non-tender, non-distended. Obese.  Neurologic:  Alert and  oriented x4 Psych:  Alert and cooperative. Normal mood and affect.  Intake/Output from previous day:   Intake/Output this shift:    Lab Results:  Recent Labs  01/02/15 0636 01/03/15 0554 01/04/15 0545  WBC 6.0 5.0 5.7  HGB 9.0* 8.9* 8.5*  HCT 27.3* 27.5* 25.8*  PLT 94* 82* 84*   BMET  Recent Labs  01/02/15 0636 01/03/15 0554 01/04/15 0545  NA 139 139 136  K 4.1 3.8 3.7  CL 106 108 103  CO2 22 24 24   GLUCOSE 96 113* 102*  BUN 48* 25* 21*  CREATININE 1.12* 0.60 0.59  CALCIUM 9.5 9.9 9.6   LFT  Recent Labs  01/02/15 0636  PROT 6.2*  ALBUMIN 3.1*  AST 34  ALT 14  ALKPHOS 68  BILITOT 1.5*   PT/INR  Recent Labs  01/03/15 0554 01/04/15 0545  LABPROT 38.2* 41.7*  INR 4.03* 4.53*     Studies/Results: Dg Chest Port 1 View  01/04/2015   CLINICAL DATA:  Shortness of breath.  Sepsis.  EXAM: PORTABLE CHEST - 1 VIEW  COMPARISON:  01/01/2015  FINDINGS: Heart size is normal. Mediastinal shadows are normal except for calcification and unfolding of the thoracic aorta. The lungs are clear. The vascularity is normal. No effusions. No acute bone finding.  IMPRESSION: No active disease.   Electronically  Signed   By: Nelson Chimes M.D.   On: 01/04/2015 07:15    Assessment: Pleasant 74 year old lady with multiple medical problems including history of a CVA admitted with decline in mental status in the setting of acute renal failure. History of chronic anemia with drift in Hgb, heme positive stool, and reports of chronic low-volume hematochezia in the setting of chronic anticoagulation. No concerning GI bleeding per nursing and staff. Colonoscopy fairly up-to-date as of 2014. No need for lower GI evaluation. Coumadin dosing per pharmacy. INR remains supratherapeutic.   Chronic dysphagia: since stroke in 2013. No acute symptoms. Consider speech evaluation as outpatient if any worsening symptoms. EGD on file from 2014 with empiric dilation.   Plan: Will follow peripherally Coumadin dosing per pharmacy Scheduled Anusol cream BID  Consider speech evaluation Change diet to soft    Orvil Feil, ANP-BC [facility]     LOS: 3 days    01/04/2015, 8:40 AM

## 2015-01-04 NOTE — Care Management Important Message (Signed)
Important Message  Patient Details  Name: Brittany Clarke MRN: [id number] Date of Birth: 06-10-1940   Medicare Important Message Given:  Yes-second notification given    Sherald Barge, RN 01/04/2015, 2:05 PM

## 2015-01-04 NOTE — Progress Notes (Signed)
ANTIBIOTIC CONSULT NOTE - INITIAL  Pharmacy Consult for Vancomycin and Zosyn Indication: FUO  Allergies  Allergen Reactions  . Ultram [Tramadol] Shortness Of Breath    Patient Measurements: Height: 5\' 5"  (165.1 cm) Weight: 224 lb 10.4 oz (101.9 kg) IBW/kg (Calculated) : 57  Vital Signs: Temp: 98.7 F (37.1 C) (08/15 1015) Temp Source: Oral (08/15 1015) BP: 107/79 mmHg (08/15 1100) Pulse Rate: 98 (08/15 1100) Intake/Output from previous day:   Intake/Output from this shift: Total I/O In: 1033 [P.O.:480; I.V.:3; IV Piggyback:550] Out: -   Labs:  Recent Labs  01/02/15 0636 01/03/15 0554 01/04/15 0545  WBC 6.0 5.0 5.7  HGB 9.0* 8.9* 8.5*  PLT 94* 82* 84*  CREATININE 1.12* 0.60 0.59   Estimated Creatinine Clearance: 74.2 mL/min (by C-G formula based on Cr of 0.59). No results for input(s): VANCOTROUGH, VANCOPEAK, VANCORANDOM, GENTTROUGH, GENTPEAK, GENTRANDOM, TOBRATROUGH, TOBRAPEAK, TOBRARND, AMIKACINPEAK, AMIKACINTROU, AMIKACIN in the last 72 hours.   Microbiology: Recent Results (from the past 720 hour(s))  Culture, Urine     Status: None   Collection Time: 12/15/14  8:40 PM  Result Value Ref Range Status   Specimen Description URINE, CLEAN CATCH  Final   Special Requests NONE  Final   Culture   Final    MULTIPLE SPECIES PRESENT, SUGGEST RECOLLECTION Performed at [facility]    Report Status 12/17/2014 FINAL  Final  C difficile quick scan w PCR reflex     Status: None   Collection Time: 01/01/15  3:06 PM  Result Value Ref Range Status   C Diff antigen NEGATIVE NEGATIVE Final   C Diff toxin NEGATIVE NEGATIVE Final   C Diff interpretation Negative for toxigenic C. difficile  Final  MRSA PCR Screening     Status: None   Collection Time: 01/02/15  9:30 PM  Result Value Ref Range Status   MRSA by PCR NEGATIVE NEGATIVE Final    Comment:        The GeneXpert MRSA Assay (FDA approved for NASAL specimens only), is one component of a comprehensive MRSA  colonization surveillance program. It is not intended to diagnose MRSA infection nor to guide or monitor treatment for MRSA infections.   Culture, blood (x 2)     Status: None (Preliminary result)   Collection Time: 01/04/15  7:28 AM  Result Value Ref Range Status   Specimen Description BLOOD  Final   Special Requests NONE  Final   Culture NO GROWTH < 12 HOURS  Final   Report Status PENDING  Incomplete  Culture, blood (x 2)     Status: None (Preliminary result)   Collection Time: 01/04/15  7:30 AM  Result Value Ref Range Status   Specimen Description BLOOD  Final   Special Requests NONE  Final   Culture NO GROWTH < 12 HOURS  Final   Report Status PENDING  Incomplete    Medical History: Past Medical History  Diagnosis Date  . CHF (congestive heart failure)   . Diabetes mellitus without complication   . COPD (chronic obstructive pulmonary disease)   . Hypertension   . Asthma   . Shortness of breath   . Stroke     2013  . Anxiety   . Pneumonia   . Gout   . 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 Anticoagulant long-term use    Anti-infectives    Start     Dose/Rate Route Frequency Ordered Stop   01/04/15 2200  vancomycin (VANCOCIN) IVPB 1000 mg/200 mL premix     1,000 mg 200 mL/hr over 60 Minutes Intravenous Every 12 hours 01/04/15 1103     01/04/15 1000  vancomycin (VANCOCIN) 1,500 mg in sodium chloride 0.9 % 500 mL IVPB     1,500 mg 250 mL/hr over 120 Minutes Intravenous  Once 01/04/15 0914     01/04/15 1000  piperacillin-tazobactam (ZOSYN) IVPB 3.375 g     3.375 g 12.5 mL/hr over 240 Minutes Intravenous Every 8 hours 01/04/15 0915     01/04/15 0700  piperacillin-tazobactam (ZOSYN) IVPB 3.375 g  Status:  Discontinued     3.375 g 12.5 mL/hr over 240 Minutes Intravenous  Once 01/04/15 0646 01/04/15 1349   01/04/15 0700  vancomycin (VANCOCIN) IVPB 1000 mg/200 mL premix  Status:  Discontinued     1,000 mg 200 mL/hr over 60 Minutes Intravenous   Once 01/04/15 0646 01/04/15 1049     Assessment: 74yo female, obese with good renal fxn.  Admitted with acute encephalopathy likely related to dehydration, SCr has improved with hydration.  Pt spiked a fever, source unclear, CXR shows no infiltrate, cultures pending.  Goal of Therapy:  Vancomycin trough level 15-20 mcg/ml  Plan:  Zosyn 3.375gm IV q8h, each dose over 4 hrs Vancomycin 1500mg  IV now x 1 then Vancomycin 1000mg  IV q12hrs Check trough at steady state Monitor labs, renal fxn, and c/s  [place] Crane, Sherryn Pollino A 01/04/2015,11:10 AM

## 2015-01-04 NOTE — Evaluation (Signed)
Physical Therapy Evaluation Patient Details Name: Brittany Clarke MRN: [id number] DOB: 05-23-1940 Today's Date: 01/04/2015   History of Present Illness  This is a 74 year old lady who, according to her children who are at the bedside, has had alteration in her mental status over the last 3-4 days. She has had decreased alertness and decreased by mouth intake. She is normally conversant and able to walk with a quad cane. She is has been in the skilled nursing facility for the last 20 days or so receiving rehabilitation from a stroke in 2013. She did not have any history of nausea, vomiting, fever or abdominal pain. There is no history of dyspnea or chest pain. Evaluation in the emergency room shows it to be somewhat hypotensive and her renal function has deteriorated. She is now being admitted for further management.  Clinical Impression   Pt was seen for evaluation.  She was alert and oriented, able to follow directions.  Pt is found to have profound weakness throughout with right side weaker than left due to an old stroke.  She needs max assist to role to the right and to sit at EOB with head of bed fully elevated.  Pt is unable to sit unsupported, leans far to the left.  She is morbidly obese and will need a lift to transfer bed to chair.    Follow Up Recommendations Home health PT;SNF (MD reports that family plans to take pt home although she would be very appropriate for SNF)    Equipment Recommendations  Other (comment) Harrel Lemon lift)    Recommendations for Other Services   OT    Precautions / Restrictions Precautions Precautions: Fall Restrictions Weight Bearing Restrictions: No      Mobility  Bed Mobility Overal bed mobility: Needs Assistance Bed Mobility: Rolling;Supine to Sit Rolling: Max assist   Supine to sit: Max assist;HOB elevated     General bed mobility comments: once at EOB pt is unable to sit unsupported with mod to max assist...she leans far to the  left  Transfers                 General transfer comment: lift equipment will need to be used for transfer OOB  Ambulation/Gait    unable                     Wheelchair Mobility    Modified Rankin (Stroke Patients Only)       Balance Overall balance assessment: Needs assistance Sitting-balance support: Single extremity supported;Feet supported Sitting balance-Leahy Scale: Poor Sitting balance - Comments: leans left                                     Pertinent Vitals/Pain Pain Assessment: No/denies pain    Home Living Family/patient expects to be discharged to:: Private residence Living Arrangements: Alone Available Help at Discharge: Family;Personal care attendant;Available 24 hours/day (per pt report) Type of Home: Apartment Home Access: Level entry     Home Layout: One level Home Equipment: Wheelchair - manual;Cane - quad;Bedside commode;Hospital bed;Tub bench;Other (comment) (lift chair)      Prior Function Level of Independence: Needs assistance   Gait / Transfers Assistance Needed: per pt, she ambulated with a quad cane and SBA prior to going to [facility] 3 weeks ago...she states that she was in the bed or a w/c most of the time at [facility] and now can't walk at  all  ADL's / Homemaking Assistance Needed: assist with all ADLs        Hand Dominance   Dominant Hand: Right    Extremity/Trunk Assessment   Upper Extremity Assessment: Generalized weakness;RUE deficits/detail RUE Deficits / Details: old stroke with no function of hand, minimal strength at shoulder or elbow         Lower Extremity Assessment: Generalized weakness;RLE deficits/detail RLE Deficits / Details: strength 2-/5 due to old stroke with decreased knee joint ROM (flexion to 80 degrees)    Cervical / Trunk Assessment: Kyphotic  Communication   Communication: No difficulties  Cognition Arousal/Alertness: Awake/alert Behavior During Therapy: WFL for  tasks assessed/performed Overall Cognitive Status: Within Functional Limits for tasks assessed                                    Assessment/Plan    PT Assessment Patient needs continued PT services  PT Diagnosis Generalized weakness;Hemiplegia dominant side   PT Problem List Decreased strength;Decreased range of motion;Decreased activity tolerance;Decreased balance;Decreased mobility;Obesity;Impaired tone  PT Treatment Interventions Functional mobility training;Therapeutic activities;Therapeutic exercise;Balance training   PT Goals (Current goals can be found in the Care Plan section) Acute Rehab PT Goals Patient Stated Goal: wants to be able to walk PT Goal Formulation: With patient Time For Goal Achievement: 01/18/15 Potential to Achieve Goals: Poor    Frequency Min 3X/week   Barriers to discharge   none known                   End of Session   Activity Tolerance: Patient limited by fatigue Patient left: in bed;with call bell/phone within reach;with bed alarm set      Functional Assessment Tool Used: clinical judgement Functional Limitation: Mobility: Walking and moving around Mobility: Walking and Moving Around Current Status (T0569): At least 80 percent but less than 100 percent impaired, limited or restricted Mobility: Walking and Moving Around Goal Status [phone]): At least 60 percent but less than 80 percent impaired, limited or restricted    Time: 0955-1032 PT Time Calculation (min) (ACUTE ONLY): 37 min   Charges:   PT Evaluation $Initial PT Evaluation Tier I: 1 Procedure     PT G Codes:   PT G-Codes **NOT FOR INPATIENT CLASS** Functional Assessment Tool Used: clinical judgement Functional Limitation: Mobility: Walking and moving around Mobility: Walking and Moving Around Current Status (X6553): At least 80 percent but less than 100 percent impaired, limited or restricted Mobility: Walking and Moving Around Goal Status [phone]): At least  60 percent but less than 80 percent impaired, limited or restricted    Sable Feil  PT 01/04/2015, 10:48 AM [phone]

## 2015-01-04 NOTE — Progress Notes (Signed)
[facility] PROGRESS NOTE  Brittany Clarke BLT:[id number] DOB: [birth year] DOA: 01/01/2015 PCP: [name], MD  Assessment/Plan: 1. Acute encephalopathy. Likely related to dehydration and ARF.  Appears to have resolved and mental status back to baseline. 2. ARF. Resolved with IVF. Creatinine normal at 0.6. Home dose of Lasix resumed. 3. Chronic anemia. Baseline HGB appears to be around 10 currently 8.5. She is on anticoagulation for history of stroke. She was found to have heme positive stool, no gross bleeding has been reported. GI consulted and did not recommend further inpatient evaluations.  Start Anusol cream BID.  Last colonoscopy 2014.  Follow hemoglobin trend.  4. DM. Stable, continue SSI. 5. Hx of CVA. Patient is on coumadin and INR is supratherapeutic at 4.53. Coumadin dosed per pharmacy. Continue anticoagulation therapy for now. 6. HTN. Pt was found to be hypotensive on admission. Blood pressure improved with IVF. Metoprolol has been resumed but ACE inhibitors are still on hold, blood pressure stable. 7. HLD. Continue Lipitor. 8. Deconditioning.  9. Febrile.  Etiology unclear.CXR shows no infiltrate, blood cultures pending.  Repeat UA. Urine culture ordered, pending.  Start Vanc and Zosyn.  Follow-up cultures.  Code Status: FULL DVT prophylaxis:  Warfarin  Family Communication: Patient was alone, care plan was discussed and there are no questions at this time Disposition Plan: Family wishes to take the pt home with home services. Anticipate discharge in the next 24-48 hours.    Consultants:  PT  GI     Procedures:    Antibiotics:  Vancomycin 8/15>>  Zosyn 8/15>>  HPI/Subjective: Pt sts "ok" this morning.  Denies SOB.  Denies cough.  No n/v/d.  +Dysuria.    Objective: Filed Vitals:   01/03/15 2237  BP: 112/45  Pulse: 92  Temp: 99.2 F (37.3 C)  Resp: 20   No intake or output data in the 24 hours ending 01/04/15 0627 Filed Weights   01/01/15 1508  01/01/15 2102 01/02/15 0500  Weight: 102.059 kg (225 lb) 101.5 kg (223 lb 12.3 oz) 101.9 kg (224 lb 10.4 oz)    Exam: General: NAD, looks comfortable lying in bed.  Cardiovascular: RRR, S1, S2  Respiratory: CTAB Abdomen: soft, no distention, bowel sounds normal, Mild tenderness to deep palpation diffusely throughout the abdomen. Musculoskeletal: 1+ pitting edema bilaterally  Data Reviewed: Basic Metabolic Panel:  Recent Labs Lab 12/28/14 1430 01/01/15 0700 01/01/15 1645 01/02/15 0636 01/03/15 0554  NA 138 134* 134* 139 139  K 4.2 3.9 4.3 4.1 3.8  CL 103 98* 98* 106 108  CO2 25 25 23 22 24   GLUCOSE 84 104* 97 96 113*  BUN 27* 57* 63* 48* 25*  CREATININE 0.82 1.69* 1.86* 1.12* 0.60  CALCIUM 9.7 9.5 9.8 9.5 9.9   Liver Function Tests:  Recent Labs Lab 12/28/14 1430 01/01/15 0700 01/02/15 0636  AST 31 33 34  ALT 13* 14 14  ALKPHOS 72 58 68  BILITOT 1.3* 1.3* 1.5*  PROT 6.1* 6.0* 6.2*  ALBUMIN 3.2* 3.1* 3.1*   No results for input(s): LIPASE, AMYLASE in the last 168 hours. No results for input(s): AMMONIA in the last 168 hours. CBC:  Recent Labs Lab 12/28/14 1430 12/30/14 0755 01/01/15 0700 01/01/15 1529 01/02/15 0636 01/03/15 0554  WBC 5.3 6.1 6.0 7.2 6.0 5.0  NEUTROABS 2.1  --  2.1  --   --   --   HGB 9.9* 9.9* 9.2* 10.0* 9.0* 8.9*  HCT 30.5* 30.7* 28.3* 30.0* 27.3* 27.5*  MCV 84.5 84.1 83.5  83.3 83.2 84.6  PLT 75* 75* 87* 79* 94* 82*   Cardiac Enzymes: No results for input(s): CKTOTAL, CKMB, CKMBINDEX, TROPONINI in the last 168 hours. BNP (last 3 results) No results for input(s): BNP in the last 8760 hours.  ProBNP (last 3 results) No results for input(s): PROBNP in the last 8760 hours.  CBG:  Recent Labs Lab 01/03/15 1145 01/03/15 1627  GLUCAP 135* 92    Recent Results (from the past 240 hour(s))  C difficile quick scan w PCR reflex     Status: None   Collection Time: 01/01/15  3:06 PM  Result Value Ref Range Status   C Diff antigen  NEGATIVE NEGATIVE Final   C Diff toxin NEGATIVE NEGATIVE Final   C Diff interpretation Negative for toxigenic C. difficile  Final  MRSA PCR Screening     Status: None   Collection Time: 01/02/15  9:30 PM  Result Value Ref Range Status   MRSA by PCR NEGATIVE NEGATIVE Final    Comment:        The GeneXpert MRSA Assay (FDA approved for NASAL specimens only), is one component of a comprehensive MRSA colonization surveillance program. It is not intended to diagnose MRSA infection nor to guide or monitor treatment for MRSA infections.      Studies: No results found.  Scheduled Meds: . allopurinol  300 mg Oral Daily  . atorvastatin  40 mg Oral q1800  . docusate sodium  100 mg Oral BID  . feeding supplement (GLUCERNA SHAKE)  237 mL Oral TID BM  . feeding supplement (PRO-STAT SUGAR FREE 64)  30 mL Oral BID  . furosemide  20 mg Oral Daily  . gabapentin  100 mg Oral BID  . metoprolol tartrate  12.5 mg Oral BID  . montelukast  10 mg Oral QPM  . polyethylene glycol  17 g Oral Daily  . sodium chloride  3 mL Intravenous Q12H  . Warfarin - Pharmacist Dosing Inpatient   Does not apply Q24H   Continuous Infusions:    Active Problems:   Anemia   HTN (hypertension)   DM (diabetes mellitus)   COPD (chronic obstructive pulmonary disease)   Chronic anticoagulation   Thrombocytopenia   Altered mental status   Dehydration   Blood in stool    Time spent: 25 minutes     [name], M.D  [facility] Pager [phone]. If 7PM-7AM, please contact night-coverage at [email or web address], password [facility] 01/04/2015, 6:27 AM  LOS: 3 days     I, Brittany Clarke, acting as scribe, recorded this note contemporaneously in the presence of Dr. [name], M.D. on 01/04/2015.  I have reviewed the above documentation for accuracy and completeness, and I agree with the above.  [name], M.D.

## 2015-01-04 NOTE — Care Management Note (Signed)
Case Management Note  Patient Details  Name: Brittany Clarke MRN: [id number] Date of Birth: 02/08/1941  Expected Discharge Date:   01/06/2015               Expected Discharge Plan:  Home/Self Care  In-House Referral:  Clinical Social Work  Discharge planning Services  CM Consult  Post Acute Care Choice:  Durable Medical Equipment, Home Health Choice offered to:  Adult Children  DME Arranged:  Other see comment Optician, dispensing) DME Agency:  [place] Arranged:  RN, PT [facility] Agency:  [place]  Status of Service:  In process, will continue to follow  Medicare Important Message Given:    Date Medicare IM Given:    Medicare IM give by:    Date Additional Medicare IM Given:    Additional Medicare Important Message give by:     If discussed at [place] of Stay Meetings, dates discussed:    Additional Comments: Pt is from [facility]. Pt and family wish for patient to return home at discharge. Pt has CAP aid 7 days a weekend then family or PD care providers are with patient when aid is not there. Pt has wheelchair, hospital bed, bsc at home. Pt will need HH RN PT and hoyer lift at DC. Pt referred agency choice to family. Pt has 5 children and stated it didn't matter which child I spoke with. CM called daughter Estell Harpin, as she was listed first, she states pt's CAP aid can resume services at any time. They would prefer [facility] be HH and DME agency. Romualdo Bolk made aware of referral for [facility] and Ernestina Columbia made aware of referral for hoyer lift, both will obtain pt info from chart. Pt's daughter made aware that pt will be bed bound and total care, daughter states she was close to that before when she was at home. CM will Cont to follow for DC planning needs.   Sherald Barge, RN 01/04/2015, 11:24 AM

## 2015-01-05 DIAGNOSIS — N179 Acute kidney failure, unspecified: Principal | ICD-10-CM

## 2015-01-05 LAB — CBC
HEMATOCRIT: 25.3 % — AB (ref 36.0–46.0)
HEMOGLOBIN: 8.3 g/dL — AB (ref 12.0–15.0)
MCH: 27.2 pg (ref 26.0–34.0)
MCHC: 32.8 g/dL (ref 30.0–36.0)
MCV: 83 fL (ref 78.0–100.0)
PLATELETS: 71 10*3/uL — AB (ref 150–400)
RBC: 3.05 MIL/uL — AB (ref 3.87–5.11)
RDW: 18.6 % — ABNORMAL HIGH (ref 11.5–15.5)
WBC: 6.1 10*3/uL (ref 4.0–10.5)

## 2015-01-05 LAB — PROTIME-INR
INR: 5.72 (ref 0.00–1.49)
Prothrombin Time: 49.7 seconds — ABNORMAL HIGH (ref 11.6–15.2)

## 2015-01-05 LAB — BASIC METABOLIC PANEL
ANION GAP: 8 (ref 5–15)
BUN: 15 mg/dL (ref 6–20)
CHLORIDE: 103 mmol/L (ref 101–111)
CO2: 24 mmol/L (ref 22–32)
Calcium: 9.3 mg/dL (ref 8.9–10.3)
Creatinine, Ser: 0.65 mg/dL (ref 0.44–1.00)
GFR calc non Af Amer: >60 mL/min (ref >60)
Glucose, Bld: 118 mg/dL — ABNORMAL HIGH (ref 65–99)
POTASSIUM: 3.5 mmol/L (ref 3.5–5.1)
SODIUM: 135 mmol/L (ref 135–145)

## 2015-01-05 NOTE — Progress Notes (Signed)
[place] for Coumadin (chronic Rx PTA) Indication: stroke  Allergies  Allergen Reactions  . Ultram [Tramadol] Shortness Of Breath   Patient Measurements: Height: 5\' 5"  (165.1 cm) Weight: 224 lb 10.4 oz (101.9 kg) IBW/kg (Calculated) : 57  Vital Signs: Temp: 98.9 F (37.2 C) (08/16 0610) Temp Source: Oral (08/16 0610) BP: 124/48 mmHg (08/16 0610) Pulse Rate: 84 (08/16 0610)  Labs:  Recent Labs  01/03/15 0554 01/04/15 0545 01/04/15 0720 01/05/15 0539  HGB 8.9* 8.5*  --  8.3*  HCT 27.5* 25.8*  --  25.3*  PLT 82* 84*  --  71*  APTT  --   --  62*  --   LABPROT 38.2* 41.7*  --  49.7*  INR 4.03* 4.53*  --  5.72*  CREATININE 0.60 0.59  --  0.65   Estimated Creatinine Clearance: 74.2 mL/min (by C-G formula based on Cr of 0.65).  Medical History: Past Medical History  Diagnosis Date  . CHF (congestive heart failure)   . Diabetes mellitus without complication   . COPD (chronic obstructive pulmonary disease)   . Hypertension   . Asthma   . Shortness of breath   . Stroke     2013  . Anxiety   . Pneumonia   . Gout   . 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 Anticoagulant long-term use    Medications:  See PTA med list  Assessment: 74yo female on chronic Coumadin PTA.  INR therapeutic on admission but has now trended up to SUPRAtherapeutic range.  Home dose reportedly 4mg  daily.  Increase in INR could be due to acute illness.  CBC appears stable although low.  GI is following.  No overt bleed reported.  Goal of Therapy:  INR 2-3   Plan:  HOLD coumadin again today INR daily  [name] 01/05/2015,9:24 AM

## 2015-01-05 NOTE — Progress Notes (Signed)
[facility] PROGRESS NOTE  Brittany Clarke YTK:[id number] DOB: 1940/10/09 DOA: 01/01/2015 PCP: [name], MD  Summary 53 yof who was admitted to the hospital with acute encephalopathy and ARF which was felt to be related to dehydration. She was started on IVF with improvement of encephalopathy back to baseline and resolution of ARF. She was found to have heme positive stool on anticoagulation. Seen by GI, who did not recommend any further inpatient evaluation at this time. She developed fever during her hospital stay and is currently on abx, if she remains a febrile over the next 24 hours and cx remain negative she can likely be discharge home tomorrow.  Assessment/Plan: 1. Acute encephalopathy. Likely related to dehydration and ARF.  Appears to have resolved and mental status back to baseline. 2. ARF. Resolved with IVF. Creatinine normal at 0.65. Home dose of Lasix resumed. 3. Chronic anemia. Baseline HGB appears to be around 10 currently 8.3. She is on anticoagulation for history of stroke. She was found to have heme positive stool, no gross bleeding has been reported. GI consulted and did not recommend further inpatient evaluations unless she develops gross bleeding.  Started on  Anusol cream BID.  Last colonoscopy 2014.  Follow hemoglobin trend.  4. Thrombocytopenia. Chronic issue. Appears stable. Follow up as outpatient. 5. DM. Stable, continue SSI. 6. Hx of CVA. Patient is on coumadin and INR is supratherapeutic at 4.53. Coumadin dosed per pharmacy. Continue anticoagulation therapy for now. 7. HTN. Pt was found to be hypotensive on admission. Blood pressure improved with IVF. Metoprolol has been resumed but ACE inhibitors are still on hold, blood pressure stable. 8. HLD. Continue Lipitor. 9. Deconditioning.  10. Febrile. Resolved  Etiology was unclear.CXR showed no infiltrate, Repeat UA unremarkable. Urine culture ordered, pending. Blood culture showing no growth, day one. Started  Vanc and Zosyn.If cultures show no growth by tomorrow and she remains afebrile, can likely discontinue abx.   Code Status: FULL DVT prophylaxis:  Warfarin  Family Communication: Patient was alone, care plan was discussed and there are no questions at this time Disposition Plan:  Anticipate discharge in the next 24 hours. PT recommended SNF, although family wants to take the pt home. She will need home health services arranged.    Consultants:  PT  GI     Procedures:    Antibiotics:  Vancomycin 8/15>>  Zosyn 8/15>>  HPI/Subjective: Feeling better then she was, able to eat without difficulty. Swelling in her legs have improved. No reports of chest pain, coughing, shortness of breath, n/v/d, or abd pain.  Objective: Filed Vitals:   01/05/15 0610  BP: 124/48  Pulse: 84  Temp: 98.9 F (37.2 C)  Resp: 20    Intake/Output Summary (Last 24 hours) at 01/05/15 0649 Last data filed at 01/04/15 1502  Gross per 24 hour  Intake   1703 ml  Output      0 ml  Net   1703 ml   Filed Weights   01/01/15 1508 01/01/15 2102 01/02/15 0500  Weight: 102.059 kg (225 lb) 101.5 kg (223 lb 12.3 oz) 101.9 kg (224 lb 10.4 oz)    Exam: General: NAD, looks comfortable lying in bed.  Cardiovascular: RRR, S1, S2  Respiratory: CTAB Abdomen: soft, no distention, bowel sounds normal, Mild tenderness to deep palpation diffusely throughout the abdomen. Musculoskeletal: 1+ pitting edema bilaterally  Data Reviewed: Basic Metabolic Panel:  Recent Labs Lab 01/01/15 1645 01/02/15 0636 01/03/15 0554 01/04/15 0545 01/05/15 0539  NA 134* 139 139  136 135  K 4.3 4.1 3.8 3.7 3.5  CL 98* 106 108 103 103  CO2 23 22 24 24 24   GLUCOSE 97 96 113* 102* 118*  BUN 63* 48* 25* 21* 15  CREATININE 1.86* 1.12* 0.60 0.59 0.65  CALCIUM 9.8 9.5 9.9 9.6 9.3   Liver Function Tests:  Recent Labs Lab 01/01/15 0700 01/02/15 0636  AST 33 34  ALT 14 14  ALKPHOS 58 68  BILITOT 1.3* 1.5*  PROT 6.0* 6.2*   ALBUMIN 3.1* 3.1*   No results for input(s): LIPASE, AMYLASE in the last 168 hours. No results for input(s): AMMONIA in the last 168 hours. CBC:  Recent Labs Lab 01/01/15 0700 01/01/15 1529 01/02/15 0636 01/03/15 0554 01/04/15 0545  WBC 6.0 7.2 6.0 5.0 5.7  NEUTROABS 2.1  --   --   --   --   HGB 9.2* 10.0* 9.0* 8.9* 8.5*  HCT 28.3* 30.0* 27.3* 27.5* 25.8*  MCV 83.5 83.3 83.2 84.6 83.5  PLT 87* 79* 94* 82* 84*   Cardiac Enzymes: No results for input(s): CKTOTAL, CKMB, CKMBINDEX, TROPONINI in the last 168 hours. BNP (last 3 results) No results for input(s): BNP in the last 8760 hours.  ProBNP (last 3 results) No results for input(s): PROBNP in the last 8760 hours.  CBG:  Recent Labs Lab 01/03/15 1145 01/03/15 1627  GLUCAP 135* 92    Recent Results (from the past 240 hour(s))  C difficile quick scan w PCR reflex     Status: None   Collection Time: 01/01/15  3:06 PM  Result Value Ref Range Status   C Diff antigen NEGATIVE NEGATIVE Final   C Diff toxin NEGATIVE NEGATIVE Final   C Diff interpretation Negative for toxigenic C. difficile  Final  MRSA PCR Screening     Status: None   Collection Time: 01/02/15  9:30 PM  Result Value Ref Range Status   MRSA by PCR NEGATIVE NEGATIVE Final    Comment:        The GeneXpert MRSA Assay (FDA approved for NASAL specimens only), is one component of a comprehensive MRSA colonization surveillance program. It is not intended to diagnose MRSA infection nor to guide or monitor treatment for MRSA infections.   Culture, blood (x 2)     Status: None (Preliminary result)   Collection Time: 01/04/15  7:28 AM  Result Value Ref Range Status   Specimen Description BLOOD  Final   Special Requests NONE  Final   Culture NO GROWTH < 12 HOURS  Final   Report Status PENDING  Incomplete  Culture, blood (x 2)     Status: None (Preliminary result)   Collection Time: 01/04/15  7:30 AM  Result Value Ref Range Status   Specimen Description  BLOOD  Final   Special Requests NONE  Final   Culture NO GROWTH < 12 HOURS  Final   Report Status PENDING  Incomplete     Studies: Dg Chest Port 1 View  01/04/2015   CLINICAL DATA:  Shortness of breath.  Sepsis.  EXAM: PORTABLE CHEST - 1 VIEW  COMPARISON:  01/01/2015  FINDINGS: Heart size is normal. Mediastinal shadows are normal except for calcification and unfolding of the thoracic aorta. The lungs are clear. The vascularity is normal. No effusions. No acute bone finding.  IMPRESSION: No active disease.   Electronically Signed   By: Nelson Chimes M.D.   On: 01/04/2015 07:15    Scheduled Meds: . allopurinol  300 mg Oral Daily  .  atorvastatin  40 mg Oral q1800  . docusate sodium  100 mg Oral BID  . feeding supplement (GLUCERNA SHAKE)  237 mL Oral TID BM  . feeding supplement (PRO-STAT SUGAR FREE 64)  30 mL Oral BID  . furosemide  20 mg Oral Daily  . gabapentin  100 mg Oral BID  . hydrocortisone  25 mg Rectal BID  . metoprolol tartrate  12.5 mg Oral BID  . montelukast  10 mg Oral QPM  . piperacillin-tazobactam (ZOSYN)  IV  3.375 g Intravenous Q8H  . polyethylene glycol  17 g Oral Daily  . sodium chloride  3 mL Intravenous Q12H  . vancomycin  1,000 mg Intravenous Q12H  . Warfarin - Pharmacist Dosing Inpatient   Does not apply Q24H   Continuous Infusions:    Active Problems:   Anemia   HTN (hypertension)   DM (diabetes mellitus)   COPD (chronic obstructive pulmonary disease)   Chronic anticoagulation   Thrombocytopenia   Altered mental status   Dehydration   Blood in stool   Acute encephalopathy   Acute renal failure   Fever    Time spent: 25 minutes     [name], M.D  [facility] Pager [phone]. If 7PM-7AM, please contact night-coverage at [email or web address], password [facility] 01/05/2015, 6:49 AM  LOS: 4 days     I, Jessica D. Leonie Green, acting as scribe, recorded this note contemporaneously in the presence of Dr. [name], M.D. on 01/05/2015.  I have  reviewed the above documentation for accuracy and completeness, and I agree with the above.  [name], M.D.

## 2015-01-05 NOTE — Progress Notes (Signed)
Physical Therapy Treatment Patient Details Name: Brittany Clarke MRN: [id number] DOB: 28-Nov-1940 Today's Date: 01/05/2015    History of Present Illness This is a 74 year old lady who, according to her children who are at the bedside, has had alteration in her mental status over the last 3-4 days. She has had decreased alertness and decreased by mouth intake. She is normally conversant and able to walk with a walker. She is has been in the skilled nursing facility for the last 20 days or so receiving rehabilitation from a stroke in 2013. She did not have any history of nausea, vomiting, fever or abdominal pain. There is no history of dyspnea or chest pain. Evaluation in the emergency room shows it to be somewhat hypotensive and her renal function has deteriorated. She is now being admitted for further management.    PT Comments    Pt states that she feels better today.  She was able to participate in generalized ROM/strengthening exercise to the neck and extremities.  She continues to have significant stiffness/pain with movement of the right knee due to OA.  She was instructed in rolling left to right and is now able to complete this with min assist.  She transferred supine to sit with max assist but was able to sit independently with fair assist.  We were going to try to stand her up in the [place]ascade Lift device but she declined.  She was fearful that her feet would slip without her shoes on. (They are currently not available).  Once she is in her home setting her mobility should progress.  Follow Up Recommendations  Home health PT     Equipment Recommendations  Other (comment) (hoyer lift)    Recommendations for Other Services  none     Precautions / Restrictions Precautions Precautions: Fall Precaution Comments:   Restrictions Weight Bearing Restrictions: No    Mobility  Bed Mobility Overal bed mobility: Needs Assistance Bed Mobility: Rolling Rolling: Min assist   Supine to sit: Max  assist;HOB elevated     General bed mobility comments: pt is now able to roll left to right with min assist when directed as to correct technique  Transfers Overall transfer level: Needs assistance               General transfer comment: The CNA has been asked to transfer pt to a chair using the maxi move lift this afternoon                                    Balance Overall balance assessment: Needs assistance Sitting-balance support: Single extremity supported Sitting balance-Leahy Scale: Fair Sitting balance - Comments: pt is now able to sit erectly without support x 10 minutes...she does fall backward if balance is challenged                            Cognition Arousal/Alertness: Awake/alert Behavior During Therapy: WFL for tasks assessed/performed Overall Cognitive Status: Within Functional Limits for tasks assessed                      Exercises General Exercises - Upper Extremity Shoulder Flexion: AAROM;Both;10 reps;Supine General Exercises - Lower Extremity Ankle Circles/Pumps: AROM;AAROM;Both;10 reps;Supine Short Arc Quad: AAROM;Both;10 reps;Supine Heel Slides: AAROM;Both;10 reps;Supine Hip ABduction/ADduction: AAROM;Both;10 reps;Supine            Pertinent Vitals/Pain Pain Assessment:  No/denies pain                                      PT Goals (current goals can now be found in the care plan section) Progress towards PT goals: PT to reassess next treatment (current goals met)    Frequency  Min 3X/week    PT Plan Current plan remains appropriate                 End of Session   Activity Tolerance: Patient tolerated treatment well Patient left: in bed;with call bell/phone within reach;with family/visitor present     Time: 6203-5597 PT Time Calculation (min) (ACUTE ONLY): 42 min  Charges:  $Therapeutic Exercise: 8-22 mins $Therapeutic Activity: 8-22 mins                    G CodesSable Feil PT 01/05/2015, 12:05 PM [phone]

## 2015-01-06 ENCOUNTER — Inpatient Hospital Stay (HOSPITAL_COMMUNITY): Payer: Medicare Other

## 2015-01-06 DIAGNOSIS — D699 Hemorrhagic condition, unspecified: Secondary | ICD-10-CM

## 2015-01-06 DIAGNOSIS — R791 Abnormal coagulation profile: Secondary | ICD-10-CM | POA: Diagnosis not present

## 2015-01-06 DIAGNOSIS — D6832 Hemorrhagic disorder due to extrinsic circulating anticoagulants: Secondary | ICD-10-CM | POA: Diagnosis present

## 2015-01-06 DIAGNOSIS — R509 Fever, unspecified: Secondary | ICD-10-CM

## 2015-01-06 DIAGNOSIS — T45515A Adverse effect of anticoagulants, initial encounter: Secondary | ICD-10-CM

## 2015-01-06 LAB — URINE CULTURE: Culture: NO GROWTH

## 2015-01-06 LAB — CBC
HCT: 25.8 % — ABNORMAL LOW (ref 36.0–46.0)
HEMOGLOBIN: 8.5 g/dL — AB (ref 12.0–15.0)
MCH: 27.2 pg (ref 26.0–34.0)
MCHC: 32.9 g/dL (ref 30.0–36.0)
MCV: 82.7 fL (ref 78.0–100.0)
PLATELETS: 73 10*3/uL — AB (ref 150–400)
RBC: 3.12 MIL/uL — AB (ref 3.87–5.11)
RDW: 18.8 % — ABNORMAL HIGH (ref 11.5–15.5)
WBC: 6.9 10*3/uL (ref 4.0–10.5)

## 2015-01-06 LAB — BASIC METABOLIC PANEL
ANION GAP: 8 (ref 5–15)
BUN: 15 mg/dL (ref 6–20)
CHLORIDE: 102 mmol/L (ref 101–111)
CO2: 25 mmol/L (ref 22–32)
CREATININE: 0.79 mg/dL (ref 0.44–1.00)
Calcium: 9.4 mg/dL (ref 8.9–10.3)
GFR calc non Af Amer: >60 mL/min (ref >60)
Glucose, Bld: 110 mg/dL — ABNORMAL HIGH (ref 65–99)
POTASSIUM: 3.1 mmol/L — AB (ref 3.5–5.1)
SODIUM: 135 mmol/L (ref 135–145)

## 2015-01-06 LAB — INFLUENZA PANEL BY PCR (TYPE A & B)
H1N1FLUPCR: NOT DETECTED
INFLAPCR: NEGATIVE
INFLBPCR: NEGATIVE

## 2015-01-06 LAB — PROTIME-INR
INR: 6.85 (ref 0.00–1.49)
Prothrombin Time: 56.9 seconds — ABNORMAL HIGH (ref 11.6–15.2)

## 2015-01-06 LAB — RAPID STREP SCREEN (MED CTR MEBANE ONLY): Streptococcus, Group A Screen (Direct): NEGATIVE

## 2015-01-06 LAB — MAGNESIUM: MAGNESIUM: 1 mg/dL — AB (ref 1.7–2.4)

## 2015-01-06 MED ORDER — PERFLUTREN LIPID MICROSPHERE
1.0000 mL | INTRAVENOUS | Status: AC | PRN
  Administered 2015-01-06: 3 mL via INTRAVENOUS
  Filled 2015-01-06: qty 10

## 2015-01-06 MED ORDER — MAGNESIUM SULFATE 2 GM/50ML IV SOLN
2.0000 g | Freq: Once | INTRAVENOUS | Status: AC
  Administered 2015-01-06: 2 g via INTRAVENOUS
  Filled 2015-01-06: qty 50

## 2015-01-06 MED ORDER — POTASSIUM CHLORIDE CRYS ER 20 MEQ PO TBCR
40.0000 meq | EXTENDED_RELEASE_TABLET | ORAL | Status: AC
  Administered 2015-01-06 (×2): 40 meq via ORAL
  Filled 2015-01-06 (×2): qty 2

## 2015-01-06 MED ORDER — POTASSIUM CHLORIDE CRYS ER 20 MEQ PO TBCR: 40.0000 meq | EXTENDED_RELEASE_TABLET | ORAL | Status: DC

## 2015-01-06 MED ORDER — RISAQUAD PO CAPS
1.0000 | ORAL_CAPSULE | Freq: Every day | ORAL | Status: DC
  Administered 2015-01-06 – 2015-01-09 (×4): 1 via ORAL
  Filled 2015-01-06 (×4): qty 1

## 2015-01-06 MED ORDER — PANTOPRAZOLE SODIUM 40 MG PO TBEC
40.0000 mg | DELAYED_RELEASE_TABLET | Freq: Every day | ORAL | Status: DC
  Administered 2015-01-06 – 2015-01-09 (×4): 40 mg via ORAL
  Filled 2015-01-06 (×4): qty 1

## 2015-01-06 NOTE — Progress Notes (Signed)
CRITICAL VALUE ALERT  Critical value received: PT 56.9, INR 6.85  Date of notification: 01-06-15  Time of notification: 0630  Critical value read back: yes  Nurse who received alert:  b Milana Obey  MD notified (1st page):  Dr. Maudie Mercury  Time of first page:  [phone]  MD notified (2nd page):  Time of second page:  Responding MD:  Dr. Maudie Mercury  Time MD responded: [phone]

## 2015-01-06 NOTE — Progress Notes (Signed)
[facility] for Coumadin (chronic Rx PTA) Indication: stroke  Allergies  Allergen Reactions  . Ultram [Tramadol] Shortness Of Breath   Patient Measurements: Height: 5\' 5"  (165.1 cm) Weight: 224 lb 10.4 oz (101.9 kg) IBW/kg (Calculated) : 57  Vital Signs: Temp: 99.3 F (37.4 C) (08/17 0622) Temp Source: Oral (08/17 0622) BP: 100/64 mmHg (08/17 0622) Pulse Rate: 89 (08/17 0622)  Labs:  Recent Labs  01/04/15 0545 01/04/15 0720 01/05/15 0539 01/06/15 0517  HGB 8.5*  --  8.3* 8.5*  HCT 25.8*  --  25.3* 25.8*  PLT 84*  --  71* 73*  APTT  --  62*  --   --   LABPROT 41.7*  --  49.7* 56.9*  INR 4.53*  --  5.72* 6.85*  CREATININE 0.59  --  0.65 0.79   Estimated Creatinine Clearance: 74.2 mL/min (by C-G formula based on Cr of 0.79).  Medical History: Past Medical History  Diagnosis Date  . CHF (congestive heart failure)   . Diabetes mellitus without complication   . COPD (chronic obstructive pulmonary disease)   . Hypertension   . Asthma   . Shortness of breath   . Stroke     2013  . Anxiety   . Pneumonia   . Gout   . 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 Anticoagulant long-term use    Medications:  Medications Prior to Admission  Medication Sig Dispense Refill  . allopurinol (ZYLOPRIM) 300 MG tablet TAKE ONE (1) TABLET EACH DAY 30 tablet 2  . Amino Acids-Protein Hydrolys (FEEDING SUPPLEMENT, PRO-STAT SUGAR FREE 64,) LIQD Take 30 mLs by mouth 2 (two) times daily.    Marland Kitchen atorvastatin (LIPITOR) 40 MG tablet TAKE ONE (1) TABLET EACH DAY 30 tablet 0  . baclofen (LIORESAL) 10 MG tablet TAKE 1/2 TABLET 3 TIMES DAILY 45 each 0  . feeding supplement (GLUCERNA SHAKE) LIQD Take 237 mLs by mouth 3 (three) times daily between meals. (Patient taking differently: Take 120 mLs by mouth See admin instructions. Given with medications per patient request per [facility]) 60 Can 1  . furosemide (LASIX) 20 MG tablet Take 20 mg by mouth  every other day.     . gabapentin (NEURONTIN) 100 MG capsule Take 100 mg by mouth 2 (two) times daily.    Marland Kitchen gabapentin (NEURONTIN) 400 MG capsule TAKE 1 CAPSULE DAILY EVERY EVENING 30 capsule 2  . HYDROcodone-acetaminophen (NORCO) 7.5-325 MG per tablet Take one tablet by mouth twice daily as needed for pain. Do not exceed 3gm of APAP/24 form all sources. (Patient taking differently: Take 1 tablet by mouth 2 (two) times daily as needed for moderate pain or severe pain. Take one tablet by mouth twice daily as needed for pain. Do not exceed 3gm of APAP/24 form all sources.) 60 tablet 0  . hydrocortisone (ANUSOL-HC) 2.5 % rectal cream Place 1 application rectally 4 (four) times daily as needed for hemorrhoids or itching.    . loratadine (CLARITIN) 10 MG tablet TAKE ONE (1) TABLET EACH DAY 30 tablet 10  . losartan (COZAAR) 25 MG tablet TAKE ONE (1) TABLET EACH DAY 30 tablet 2  . metFORMIN (GLUCOPHAGE) 500 MG tablet TAKE ONE TABLET TWICE A DAY WITH FOOD 60 tablet 0  . metoprolol tartrate (LOPRESSOR) 25 MG tablet TAKE 1/2 TABLET TWICE DAILY 30 tablet 2  . montelukast (SINGULAIR) 10 MG tablet TAKE 1 TABLET DAILY IN THE EVENING 30 tablet 1  .  warfarin (COUMADIN) 4 MG tablet Take 4 mg by mouth every evening.     Assessment: 74yo female on chronic Coumadin PTA.  INR therapeutic on admission but has now trended up to SUPRAtherapeutic range.  Home dose reportedly 4mg  daily.  Increase in INR could be due to acute illness.  CBC appears stable although low.  GI is following.  No overt bleed reported.  Goal of Therapy:  INR 2-3   Plan:  HOLD coumadin again today INR daily  [name] 01/06/2015,11:52 AM

## 2015-01-06 NOTE — Progress Notes (Signed)
[facility] PROGRESS NOTE  Brittany Clarke VFI:[id number] DOB: 08/20/1940 DOA: 01/01/2015 PCP: [name], MD  Assessment/Plan:  Acute encephalopathy. Likely related to dehydration and ARF. resolved.   ARF. Resolved with IVF. Home dose of Lasix resumed.  Chronic anemia. Baseline HGB appears to be around 10 currently 8.5. She is on anticoagulation for history of stroke. She was found to have heme positive stool, no gross bleeding.. GI consulted and did not recommend further inpatient evaluations unless she develops gross bleeding. Started on Anusol cream BID. Last colonoscopy 2014. Continue to follow hemoglobin trend.   Thrombocytopenia. Chronic issue. Appears stable. Follow up as outpatient.  DM. Random glucose 110. Stable, continue SSI. Recent A1c 6.3.  Hx of CVA. Patient is on coumadin and INR is supratherapeutic and trending up at 6.85. May be related to antibioutic. Coumadin dosed per pharmacy. Holding coumadin. No s/sx bleeding. monitor  HTN. Pt was found to be hypotensive on admission. Blood pressure improved with IVF. Metoprolol has been resumed but ACE inhibitors are still on hold, blood pressure stable.  HLD. Continue Lipitor.  Deconditioning.   Febrile. Max temp 101.2 last night.  Etiology remains unclear. Previous CXR showed no infiltrate, Repeat UA unremarkable. Urine culture pending. Blood culture showing no growth, day 2. Continue  Vanc and Zosyn day #3. Will repeat chest xray encourage IS.   Hypokalemia: may be related to resumption of lasix. Will replete and recheck  Hypomagnesemia: Mg 1.0. Will replete and recheck.   Code Status: FULL  Family Communication: none present Disposition Plan:    Consultants:  GI  Procedures:  none  Antibiotics:  Vancomycin 8/15>>  Zosyn 8/15>>  HPI/Subjective: Awake denies pain/discomfort.   Objective: Filed Vitals:   01/06/15 0622  BP: 100/64  Pulse: 89  Temp: 99.3 F (37.4 C)  Resp: 19     Intake/Output Summary (Last 24 hours) at 01/06/15 1006 Last data filed at 01/06/15 0900  Gross per 24 hour  Intake    420 ml  Output      0 ml  Net    420 ml   Filed Weights   01/01/15 1508 01/01/15 2102 01/02/15 0500  Weight: 102.059 kg (225 lb) 101.5 kg (223 lb 12.3 oz) 101.9 kg (224 lb 10.4 oz)    Exam:   General:  Obese appears comfortable  Cardiovascular: RRR no MGR trace LE edema  Respiratory: normal effort but slightly shallow BS distant no crackesl  Abdomen: obese soft +BS non-tender to palpation  Musculoskeletal: joints without swelling/erythema   Data Reviewed: Basic Metabolic Panel:  Recent Labs Lab 01/02/15 0636 01/03/15 0554 01/04/15 0545 01/05/15 0539 01/06/15 0517  NA 139 139 136 135 135  K 4.1 3.8 3.7 3.5 3.1*  CL 106 108 103 103 102  CO2 22 24 24 24 25   GLUCOSE 96 113* 102* 118* 110*  BUN 48* 25* 21* 15 15  CREATININE 1.12* 0.60 0.59 0.65 0.79  CALCIUM 9.5 9.9 9.6 9.3 9.4  MG  --   --   --   --  1.0*   Liver Function Tests:  Recent Labs Lab 01/01/15 0700 01/02/15 0636  AST 33 34  ALT 14 14  ALKPHOS 58 68  BILITOT 1.3* 1.5*  PROT 6.0* 6.2*  ALBUMIN 3.1* 3.1*   No results for input(s): LIPASE, AMYLASE in the last 168 hours. No results for input(s): AMMONIA in the last 168 hours. CBC:  Recent Labs Lab 01/01/15 0700  01/02/15 0636 01/03/15 0554 01/04/15 0545 01/05/15 0539 01/06/15 4166  WBC 6.0  < > 6.0 5.0 5.7 6.1 6.9  NEUTROABS 2.1  --   --   --   --   --   --   HGB 9.2*  < > 9.0* 8.9* 8.5* 8.3* 8.5*  HCT 28.3*  < > 27.3* 27.5* 25.8* 25.3* 25.8*  MCV 83.5  < > 83.2 84.6 83.5 83.0 82.7  PLT 87*  < > 94* 82* 84* 71* 73*  < > = values in this interval not displayed. Cardiac Enzymes: No results for input(s): CKTOTAL, CKMB, CKMBINDEX, TROPONINI in the last 168 hours. BNP (last 3 results) No results for input(s): BNP in the last 8760 hours.  ProBNP (last 3 results) No results for input(s): PROBNP in the last 8760  hours.  CBG:  Recent Labs Lab 01/03/15 1145 01/03/15 1627  GLUCAP 135* 92    Recent Results (from the past 240 hour(s))  C difficile quick scan w PCR reflex     Status: None   Collection Time: 01/01/15  3:06 PM  Result Value Ref Range Status   C Diff antigen NEGATIVE NEGATIVE Final   C Diff toxin NEGATIVE NEGATIVE Final   C Diff interpretation Negative for toxigenic C. difficile  Final  MRSA PCR Screening     Status: None   Collection Time: 01/02/15  9:30 PM  Result Value Ref Range Status   MRSA by PCR NEGATIVE NEGATIVE Final    Comment:        The GeneXpert MRSA Assay (FDA approved for NASAL specimens only), is one component of a comprehensive MRSA colonization surveillance program. It is not intended to diagnose MRSA infection nor to guide or monitor treatment for MRSA infections.   Culture, blood (x 2)     Status: None (Preliminary result)   Collection Time: 01/04/15  7:28 AM  Result Value Ref Range Status   Specimen Description BLOOD LEFT HAND  Final   Special Requests BOTTLES DRAWN AEROBIC AND ANAEROBIC 5CC  Final   Culture NO GROWTH 1 DAY  Final   Report Status PENDING  Incomplete  Culture, blood (x 2)     Status: None (Preliminary result)   Collection Time: 01/04/15  7:30 AM  Result Value Ref Range Status   Specimen Description BLOOD LEFT WRIST  Final   Special Requests BOTTLES DRAWN AEROBIC AND ANAEROBIC 5CC  Final   Culture NO GROWTH 1 DAY  Final   Report Status PENDING  Incomplete  Urine culture     Status: None (Preliminary result)   Collection Time: 01/04/15  3:15 PM  Result Value Ref Range Status   Specimen Description URINE, CATHETERIZED  Final   Special Requests NONE  Final   Culture   Final    NO GROWTH < 12 HOURS Performed at [facility]    Report Status PENDING  Incomplete     Studies: No results found.  Scheduled Meds: . allopurinol  300 mg Oral Daily  . atorvastatin  40 mg Oral q1800  . docusate sodium  100 mg Oral BID  .  feeding supplement (GLUCERNA SHAKE)  237 mL Oral TID BM  . feeding supplement (PRO-STAT SUGAR FREE 64)  30 mL Oral BID  . furosemide  20 mg Oral Daily  . gabapentin  100 mg Oral BID  . hydrocortisone  25 mg Rectal BID  . metoprolol tartrate  12.5 mg Oral BID  . montelukast  10 mg Oral QPM  . piperacillin-tazobactam (ZOSYN)  IV  3.375 g Intravenous Q8H  .  polyethylene glycol  17 g Oral Daily  . potassium chloride  40 mEq Oral Q4H  . sodium chloride  3 mL Intravenous Q12H  . vancomycin  1,000 mg Intravenous Q12H  . Warfarin - Pharmacist Dosing Inpatient   Does not apply Q24H   Continuous Infusions:   Principal Problem:   Acute encephalopathy Active Problems:   Hypokalemia   History of CVA (cerebrovascular accident)   Anemia   HTN (hypertension)   DM (diabetes mellitus)   COPD (chronic obstructive pulmonary disease)   Chronic anticoagulation   Thrombocytopenia   Altered mental status   Dehydration   Blood in stool   Acute renal failure   Fever   Hypomagnesemia   Elevated INR    Time spent: 35 minutes    [facility] Pager [phone]. If 7PM-7AM, please contact night-coverage at [email or web address], password [facility] 01/06/2015, 10:06 AM  LOS: 5 days

## 2015-01-07 ENCOUNTER — Inpatient Hospital Stay (HOSPITAL_COMMUNITY): Payer: Medicare Other

## 2015-01-07 LAB — IRON AND TIBC
IRON: 31 ug/dL (ref 28–170)
Saturation Ratios: 12 % (ref 10.4–31.8)
TIBC: 265 ug/dL (ref 250–450)
UIBC: 234 ug/dL

## 2015-01-07 LAB — MAGNESIUM: Magnesium: 1.5 mg/dL — ABNORMAL LOW (ref 1.7–2.4)

## 2015-01-07 LAB — COMPREHENSIVE METABOLIC PANEL
ALBUMIN: 2.8 g/dL — AB (ref 3.5–5.0)
ALK PHOS: 59 U/L (ref 38–126)
ALT: 12 U/L — ABNORMAL LOW (ref 14–54)
ANION GAP: 8 (ref 5–15)
AST: 39 U/L (ref 15–41)
BILIRUBIN TOTAL: 1.9 mg/dL — AB (ref 0.3–1.2)
BUN: 18 mg/dL (ref 6–20)
CALCIUM: 9.5 mg/dL (ref 8.9–10.3)
CO2: 25 mmol/L (ref 22–32)
Chloride: 103 mmol/L (ref 101–111)
Creatinine, Ser: 0.86 mg/dL (ref 0.44–1.00)
GFR calc Af Amer: >60 mL/min (ref >60)
GLUCOSE: 108 mg/dL — AB (ref 65–99)
Potassium: 4 mmol/L (ref 3.5–5.1)
Sodium: 136 mmol/L (ref 135–145)
TOTAL PROTEIN: 5.8 g/dL — AB (ref 6.5–8.1)

## 2015-01-07 LAB — CBC
HEMATOCRIT: 25.4 % — AB (ref 36.0–46.0)
Hemoglobin: 8.4 g/dL — ABNORMAL LOW (ref 12.0–15.0)
MCH: 27.2 pg (ref 26.0–34.0)
MCHC: 33.1 g/dL (ref 30.0–36.0)
MCV: 82.2 fL (ref 78.0–100.0)
PLATELETS: 78 10*3/uL — AB (ref 150–400)
RBC: 3.09 MIL/uL — ABNORMAL LOW (ref 3.87–5.11)
RDW: 18.9 % — AB (ref 11.5–15.5)
WBC: 7.2 10*3/uL (ref 4.0–10.5)

## 2015-01-07 LAB — GLUCOSE, CAPILLARY: GLUCOSE-CAPILLARY: 95 mg/dL (ref 65–99)

## 2015-01-07 LAB — PROTIME-INR
INR: 7.32 (ref 0.00–1.49)
Prothrombin Time: 59.9 seconds — ABNORMAL HIGH (ref 11.6–15.2)

## 2015-01-07 LAB — VITAMIN B12: Vitamin B-12: 466 pg/mL (ref 180–914)

## 2015-01-07 LAB — VANCOMYCIN, TROUGH: VANCOMYCIN TR: 33 ug/mL — AB (ref 10.0–20.0)

## 2015-01-07 LAB — TSH: TSH: 0.861 u[IU]/mL (ref 0.350–4.500)

## 2015-01-07 MED ORDER — MAGNESIUM OXIDE 400 (241.3 MG) MG PO TABS
400.0000 mg | ORAL_TABLET | Freq: Every day | ORAL | Status: AC
Start: 2015-01-07 — End: 2015-01-08
  Administered 2015-01-07 – 2015-01-08 (×2): 400 mg via ORAL
  Filled 2015-01-07 (×2): qty 1

## 2015-01-07 NOTE — Progress Notes (Signed)
Bed side ultrasound being done at this time.

## 2015-01-07 NOTE — Progress Notes (Signed)
PT Cancellation Note  Patient Details Name: Brittany Clarke MRN: [id number] DOB: 05-Aug-1940   Cancelled Treatment:    Reason Eval/Treat Not Completed: Patient at procedure or test/unavailable.  Having echocardiogram.   Sable Feil  [phone] 01/07/2015, 4:02 PM [phone]

## 2015-01-07 NOTE — Progress Notes (Signed)
[facility] PROGRESS NOTE  Brittany Clarke KGM:[id number] DOB: 26-Feb-1941 DOA: 01/01/2015 PCP: [name], MD  Assessment/Plan:  Acute encephalopathy. Likely related to dehydration and ARF. resolved.   ARF. Resolved. Home dose of Lasix resumed.  Chronic anemia. Baseline HGB appears to be around 10 currently 8.5. She is on anticoagulation for history of stroke. She was found to have heme positive stool, no gross bleeding..GI consulted and did not recommend further inpatient evaluations unless she develops gross bleeding. Started on Anusol cream BID. Last colonoscopy 2014. Continue to follow hemoglobin trend.   Thrombocytopenia. Chronic issue but current level below baseline. Platelets trending up slightly. TSH within limits of normal. B12 pending.   DM. Random glucose 110. Stable, continue SSI. Recent A1c 6.3.  Hx of CVA. Patient is on coumadin and INR remains supratherapeutic and trending up at  7.32. May be related to antibioutic. Holding coumadin. No s/sx bleeding. Will stop antibiotics and monitor.   HTN. Pt was found to be hypotensive on admission. Blood pressure improved with IVF. Metoprolol has been resumed but ACE inhibitors are still on hold, blood pressure stable.  HLD. Continue Lipitor.  Deconditioning.   Febrile. Max temp 99.7. Etiology remains unclear. repeat CXR showed no infiltrate or edema, Repeat UA unremarkable. Urine culture pending. Blood culture showing no growth, day 2. Await bilateral LE venous US, echo with EF 65 % grade 1 diastolic dysfunction, influenza panel negative and rapid strep test negative.   Hypokalemia: may be related to resumption of lasix. repleted and resolved. monitor  Hypomagnesemia: Mg 1.5 s/p 2gm IV 01/06/15. Will provide 400mg  mag orally daily x2 days. recheck  Code Status: full Family Communication: none present Disposition Plan: home hopefully 24 48 hours   Consultants:  GI  Procedures:   Echo: Wall thickness  was normal. Systolic function was vigorous. The estimated ejection fraction was in the range of 65% to 70%. Wall motion was normal; there were no regional wall motion abnormalities. Doppler parameters are consistent with abnormal left ventricular relaxation (grade 1 diastolic dysfunction).none  Antibiotics:  Vancomycin 8/15>>01/07/15  Zosyn 8/15>>01/07/15  HPI/Subjective: Lying in bed watching TV. Reports feeling "chill" denies pain/discomfort  Objective: Filed Vitals:   01/07/15 0839  BP: 109/66  Pulse: 92  Temp:   Resp:     Intake/Output Summary (Last 24 hours) at 01/07/15 1237 Last data filed at 01/07/15 0930  Gross per 24 hour  Intake    290 ml  Output    105 ml  Net    185 ml   Filed Weights   01/01/15 1508 01/01/15 2102 01/02/15 0500  Weight: 102.059 kg (225 lb) 101.5 kg (223 lb 12.3 oz) 101.9 kg (224 lb 10.4 oz)    Exam:   General:  Obese alert appears comfortable  Cardiovascular: RRR no MGR trace LE edema  Respiratory: normal effort somewhat shallow. BS clear bilaterally no wheezse  Abdomen: obese soft +BS non-tender   Musculoskeletal: joins without swelling/erythema   Data Reviewed: Basic Metabolic Panel:  Recent Labs Lab 01/03/15 0554 01/04/15 0545 01/05/15 0539 01/06/15 0517 01/07/15 0533  NA 139 136 135 135 136  K 3.8 3.7 3.5 3.1* 4.0  CL 108 103 103 102 103  CO2 24 24 24 25 25   GLUCOSE 113* 102* 118* 110* 108*  BUN 25* 21* 15 15 18   CREATININE 0.60 0.59 0.65 0.79 0.86  CALCIUM 9.9 9.6 9.3 9.4 9.5  MG  --   --   --  1.0* 1.5*   Liver Function Tests:  Recent Labs Lab 01/01/15 0700 01/02/15 0636 01/07/15 0533  AST 33 34 39  ALT 14 14 12*  ALKPHOS 58 68 59  BILITOT 1.3* 1.5* 1.9*  PROT 6.0* 6.2* 5.8*  ALBUMIN 3.1* 3.1* 2.8*   No results for input(s): LIPASE, AMYLASE in the last 168 hours. No results for input(s): AMMONIA in the last 168 hours. CBC:  Recent Labs Lab 01/01/15 0700  01/03/15 0554 01/04/15 0545  01/05/15 0539 01/06/15 0517 01/07/15 0533  WBC 6.0  < > 5.0 5.7 6.1 6.9 7.2  NEUTROABS 2.1  --   --   --   --   --   --   HGB 9.2*  < > 8.9* 8.5* 8.3* 8.5* 8.4*  HCT 28.3*  < > 27.5* 25.8* 25.3* 25.8* 25.4*  MCV 83.5  < > 84.6 83.5 83.0 82.7 82.2  PLT 87*  < > 82* 84* 71* 73* 78*  < > = values in this interval not displayed. Cardiac Enzymes: No results for input(s): CKTOTAL, CKMB, CKMBINDEX, TROPONINI in the last 168 hours. BNP (last 3 results) No results for input(s): BNP in the last 8760 hours.  ProBNP (last 3 results) No results for input(s): PROBNP in the last 8760 hours.  CBG:  Recent Labs Lab 01/03/15 1145 01/03/15 1627  GLUCAP 135* 92    Recent Results (from the past 240 hour(s))  C difficile quick scan w PCR reflex     Status: None   Collection Time: 01/01/15  3:06 PM  Result Value Ref Range Status   C Diff antigen NEGATIVE NEGATIVE Final   C Diff toxin NEGATIVE NEGATIVE Final   C Diff interpretation Negative for toxigenic C. difficile  Final  MRSA PCR Screening     Status: None   Collection Time: 01/02/15  9:30 PM  Result Value Ref Range Status   MRSA by PCR NEGATIVE NEGATIVE Final    Comment:        The GeneXpert MRSA Assay (FDA approved for NASAL specimens only), is one component of a comprehensive MRSA colonization surveillance program. It is not intended to diagnose MRSA infection nor to guide or monitor treatment for MRSA infections.   Culture, blood (x 2)     Status: None (Preliminary result)   Collection Time: 01/04/15  7:28 AM  Result Value Ref Range Status   Specimen Description BLOOD LEFT HAND  Final   Special Requests BOTTLES DRAWN AEROBIC AND ANAEROBIC 5CC  Final   Culture NO GROWTH 3 DAYS  Final   Report Status PENDING  Incomplete  Culture, blood (x 2)     Status: None (Preliminary result)   Collection Time: 01/04/15  7:30 AM  Result Value Ref Range Status   Specimen Description BLOOD LEFT WRIST  Final   Special Requests BOTTLES DRAWN  AEROBIC AND ANAEROBIC 5CC  Final   Culture NO GROWTH 3 DAYS  Final   Report Status PENDING  Incomplete  Urine culture     Status: None   Collection Time: 01/04/15  3:15 PM  Result Value Ref Range Status   Specimen Description URINE, CATHETERIZED  Final   Special Requests NONE  Final   Culture   Final    NO GROWTH 2 DAYS Performed at [facility]    Report Status 01/06/2015 FINAL  Final  Rapid strep screen (not at [facility])     Status: None   Collection Time: 01/06/15  3:20 PM  Result Value Ref Range Status   Streptococcus, Group A Screen (Direct)  NEGATIVE NEGATIVE Final    Comment: (NOTE) A Rapid Antigen test may result negative if the antigen level in the sample is below the detection level of this test. The FDA has not cleared this test as a stand-alone test therefore the rapid antigen negative result has reflexed to a Group A Strep culture.      Studies: Dg Chest Port 1 View  01/06/2015   CLINICAL DATA:  Fever  EXAM: PORTABLE CHEST - 1 VIEW  COMPARISON:  January 04, 2015  FINDINGS: There is no edema or consolidation. Heart size and pulmonary vascularity are normal. No adenopathy. There are surgical clips in the right axillary region. No bone lesions.  IMPRESSION: No edema or consolidation.   Electronically Signed   By: Lowella Grip III M.D.   On: 01/06/2015 11:03    Scheduled Meds: . acidophilus  1 capsule Oral Daily  . allopurinol  300 mg Oral Daily  . atorvastatin  40 mg Oral q1800  . docusate sodium  100 mg Oral BID  . feeding supplement (GLUCERNA SHAKE)  237 mL Oral TID BM  . feeding supplement (PRO-STAT SUGAR FREE 64)  30 mL Oral BID  . furosemide  20 mg Oral Daily  . gabapentin  100 mg Oral BID  . hydrocortisone  25 mg Rectal BID  . magnesium oxide  400 mg Oral Daily  . metoprolol tartrate  12.5 mg Oral BID  . montelukast  10 mg Oral QPM  . pantoprazole  40 mg Oral Daily  . polyethylene glycol  17 g Oral Daily  . sodium chloride  3 mL Intravenous Q12H  .  Warfarin - Pharmacist Dosing Inpatient   Does not apply Q24H   Continuous Infusions:   Principal Problem:   Acute encephalopathy Active Problems:   Hypokalemia   Morbid obesity   History of CVA (cerebrovascular accident)   Anemia   HTN (hypertension)   DM II (diabetes mellitus, type II), controlled   COPD (chronic obstructive pulmonary disease)   Chronic anticoagulation   Thrombocytopenia   Altered mental status   Dehydration   Blood in stool   Acute renal failure   Fever   Hypomagnesemia   Elevated INR   Warfarin-induced coagulopathy    Time spent: 30 minutes    [facility] Pager [phone]. If 7PM-7AM, please contact night-coverage at [email or web address], password [facility] 01/07/2015, 12:37 PM  LOS: 6 days

## 2015-01-07 NOTE — Progress Notes (Signed)
Notified by lab of Critical PT 59.9 INR 7.32 Notified MD and instructed to hold today's dose of coumadin.

## 2015-01-07 NOTE — Progress Notes (Signed)
[place] for Coumadin (chronic Rx PTA) Indication: stroke  Allergies  Allergen Reactions  . Ultram [Tramadol] Shortness Of Breath   Patient Measurements: Height: 5\' 5"  (165.1 cm) Weight: 224 lb 10.4 oz (101.9 kg) IBW/kg (Calculated) : 57  Vital Signs: Temp: 99.3 F (37.4 C) (08/18 0509) Temp Source: Oral (08/18 0509) BP: 109/66 mmHg (08/18 0839) Pulse Rate: 92 (08/18 0839)  Labs:  Recent Labs  01/05/15 0539 01/06/15 0517 01/07/15 0533  HGB 8.3* 8.5* 8.4*  HCT 25.3* 25.8* 25.4*  PLT 71* 73* 78*  LABPROT 49.7* 56.9* 59.9*  INR 5.72* 6.85* 7.32*  CREATININE 0.65 0.79 0.86   Estimated Creatinine Clearance: 69 mL/min (by C-G formula based on Cr of 0.86).  Medical History: Past Medical History  Diagnosis Date  . CHF (congestive heart failure)   . Diabetes mellitus without complication   . COPD (chronic obstructive pulmonary disease)   . Hypertension   . Asthma   . Shortness of breath   . Stroke     2013  . Anxiety   . Pneumonia   . Gout   . 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 Anticoagulant long-term use    Medications:  Medications Prior to Admission  Medication Sig Dispense Refill  . allopurinol (ZYLOPRIM) 300 MG tablet TAKE ONE (1) TABLET EACH DAY 30 tablet 2  . Amino Acids-Protein Hydrolys (FEEDING SUPPLEMENT, PRO-STAT SUGAR FREE 64,) LIQD Take 30 mLs by mouth 2 (two) times daily.    Marland Kitchen atorvastatin (LIPITOR) 40 MG tablet TAKE ONE (1) TABLET EACH DAY 30 tablet 0  . baclofen (LIORESAL) 10 MG tablet TAKE 1/2 TABLET 3 TIMES DAILY 45 each 0  . feeding supplement (GLUCERNA SHAKE) LIQD Take 237 mLs by mouth 3 (three) times daily between meals. (Patient taking differently: Take 120 mLs by mouth See admin instructions. Given with medications per patient request per [facility]) 60 Can 1  . furosemide (LASIX) 20 MG tablet Take 20 mg by mouth every other day.     . gabapentin (NEURONTIN) 100 MG capsule Take 100  mg by mouth 2 (two) times daily.    Marland Kitchen gabapentin (NEURONTIN) 400 MG capsule TAKE 1 CAPSULE DAILY EVERY EVENING 30 capsule 2  . HYDROcodone-acetaminophen (NORCO) 7.5-325 MG per tablet Take one tablet by mouth twice daily as needed for pain. Do not exceed 3gm of APAP/24 form all sources. (Patient taking differently: Take 1 tablet by mouth 2 (two) times daily as needed for moderate pain or severe pain. Take one tablet by mouth twice daily as needed for pain. Do not exceed 3gm of APAP/24 form all sources.) 60 tablet 0  . hydrocortisone (ANUSOL-HC) 2.5 % rectal cream Place 1 application rectally 4 (four) times daily as needed for hemorrhoids or itching.    . loratadine (CLARITIN) 10 MG tablet TAKE ONE (1) TABLET EACH DAY 30 tablet 10  . losartan (COZAAR) 25 MG tablet TAKE ONE (1) TABLET EACH DAY 30 tablet 2  . metFORMIN (GLUCOPHAGE) 500 MG tablet TAKE ONE TABLET TWICE A DAY WITH FOOD 60 tablet 0  . metoprolol tartrate (LOPRESSOR) 25 MG tablet TAKE 1/2 TABLET TWICE DAILY 30 tablet 2  . montelukast (SINGULAIR) 10 MG tablet TAKE 1 TABLET DAILY IN THE EVENING 30 tablet 1  . warfarin (COUMADIN) 4 MG tablet Take 4 mg by mouth every evening.     Assessment: 74yo female on chronic Coumadin PTA.  INR therapeutic on admission but has  now trended up to SUPRAtherapeutic range.  Home dose reportedly 4mg  daily.  Increase in INR could be due to acute illness.  CBC appears stable although low.  GI is following.  No overt bleed reported.  Goal of Therapy:  INR 2-3   Plan:  HOLD coumadin again today INR daily  [place] Crane, Brodie Scovell A 01/07/2015,10:21 AM

## 2015-01-08 DIAGNOSIS — N179 Acute kidney failure, unspecified: Secondary | ICD-10-CM | POA: Diagnosis present

## 2015-01-08 DIAGNOSIS — R791 Abnormal coagulation profile: Secondary | ICD-10-CM

## 2015-01-08 DIAGNOSIS — Z7901 Long term (current) use of anticoagulants: Secondary | ICD-10-CM

## 2015-01-08 DIAGNOSIS — D500 Iron deficiency anemia secondary to blood loss (chronic): Secondary | ICD-10-CM

## 2015-01-08 LAB — BASIC METABOLIC PANEL
Anion gap: 8 (ref 5–15)
BUN: 19 mg/dL (ref 6–20)
CALCIUM: 9.8 mg/dL (ref 8.9–10.3)
CO2: 26 mmol/L (ref 22–32)
CREATININE: 0.81 mg/dL (ref 0.44–1.00)
Chloride: 102 mmol/L (ref 101–111)
GFR calc non Af Amer: >60 mL/min (ref >60)
Glucose, Bld: 80 mg/dL (ref 65–99)
Potassium: 3.8 mmol/L (ref 3.5–5.1)
SODIUM: 136 mmol/L (ref 135–145)

## 2015-01-08 LAB — PROTIME-INR
INR: 5.79 — AB (ref 0.00–1.49)
PROTHROMBIN TIME: 50.2 s — AB (ref 11.6–15.2)

## 2015-01-08 LAB — CULTURE, GROUP A STREP: STREP A CULTURE: NEGATIVE

## 2015-01-08 LAB — CBC
HCT: 25.3 % — ABNORMAL LOW (ref 36.0–46.0)
Hemoglobin: 8.2 g/dL — ABNORMAL LOW (ref 12.0–15.0)
MCH: 26.7 pg (ref 26.0–34.0)
MCHC: 32.4 g/dL (ref 30.0–36.0)
MCV: 82.4 fL (ref 78.0–100.0)
Platelets: 74 10*3/uL — ABNORMAL LOW (ref 150–400)
RBC: 3.07 MIL/uL — ABNORMAL LOW (ref 3.87–5.11)
RDW: 19.3 % — AB (ref 11.5–15.5)
WBC: 6.6 10*3/uL (ref 4.0–10.5)

## 2015-01-08 LAB — MAGNESIUM: MAGNESIUM: 1.5 mg/dL — AB (ref 1.7–2.4)

## 2015-01-08 NOTE — Progress Notes (Signed)
Physical Therapy Treatment Patient Details Name: Brittany Clarke MRN: [id number] DOB: [birth year] Today's Date: 01/08/2015    History of Present Illness This is a 74 year old lady who, according to her children who are at the bedside, has had alteration in her mental status over the last 3-4 days. She has had decreased alertness and decreased by mouth intake. She is normally conversant and able to walk with a walker. She is has been in the skilled nursing facility for the last 20 days or so receiving rehabilitation from a stroke in 2013. She did not have any history of nausea, vomiting, fever or abdominal pain. There is no history of dyspnea or chest pain. Evaluation in the emergency room shows it to be somewhat hypotensive and her renal function has deteriorated. She is now being admitted for further management.    PT Comments    Pt was found supine in bed with c/o right anterior thigh pain.  She had asked for pain med and was awaiting.  She was agreeable to work on gentle bed exercise today.  Due to elevated INR and low grade fever she was not appropriate to do any bed mobility/transfers.  She tolerated therapeutic exercise to LUE and LEs well with no increased pain.  RN medicated her for leg pain during my visit.  Follow Up Recommendations  Home health PT     Equipment Recommendations    Hoyer lift   Recommendations for Other Services  none     Precautions / Restrictions Precautions Precautions: Fall Precaution Comments: INR=5.9 today so no bed mobility or transfers to be done Restrictions Weight Bearing Restrictions: No    Mobility  Bed Mobility               General bed mobility comments: no bed mobility due to elevated INR  Transfers                    Ambulation/Gait                 Stairs            Wheelchair Mobility    Modified Rankin (Stroke Patients Only)       Balance                                    Cognition  Arousal/Alertness: Awake/alert Behavior During Therapy: WFL for tasks assessed/performed Overall Cognitive Status: Within Functional Limits for tasks assessed                      Exercises General Exercises - Lower Extremity Ankle Circles/Pumps: AROM;AAROM;Both;10 reps;Supine Quad Sets: AROM;Both;10 reps;Supine Gluteal Sets: AROM;Both;10 reps;Supine Short Arc Quad: AAROM;Both;10 reps;Supine;AROM Heel Slides: AAROM;Both;10 reps;Supine Hip ABduction/ADduction: AAROM;Both;10 reps;Supine    General Comments        Pertinent Vitals/Pain Pain Assessment: 0-10 Pain Score: 8  Pain Location: right thigh Pain Descriptors / Indicators: Aching Pain Intervention(s): Repositioned;Patient requesting pain meds-RN notified;RN gave pain meds during session    Home Living                      Prior Function            PT Goals (current goals can now be found in the care plan section) Progress towards PT goals: Not progressing toward goals - comment (elevated INR, unable to mobilize)    Frequency  Min 3X/week  PT Plan Current plan remains appropriate    Co-evaluation             End of Session   Activity Tolerance: Patient tolerated treatment well;No increased pain Patient left: in bed;with call bell/phone within reach;with bed alarm set     Time: 1500-1530 PT Time Calculation (min) (ACUTE ONLY): 30 min  Charges:  $Therapeutic Exercise: 23-37 mins                    G CodesSable Feil  PT 01/08/2015, 3:43 PM [phone]

## 2015-01-08 NOTE — Progress Notes (Signed)
[place] for Coumadin (chronic Rx PTA) Indication: stroke  Allergies  Allergen Reactions  . Ultram [Tramadol] Shortness Of Breath   Patient Measurements: Height: 5\' 5"  (165.1 cm) Weight: 224 lb 10.4 oz (101.9 kg) IBW/kg (Calculated) : 57  Vital Signs: Temp: 98 F (36.7 C) (08/19 0558) Temp Source: Oral (08/19 0558) BP: 127/59 mmHg (08/19 0558) Pulse Rate: 94 (08/19 0558)  Labs:  Recent Labs  01/06/15 0517 01/07/15 0533 01/08/15 0536  HGB 8.5* 8.4* 8.2*  HCT 25.8* 25.4* 25.3*  PLT 73* 78* 74*  LABPROT 56.9* 59.9* 50.2*  INR 6.85* 7.32* 5.79*  CREATININE 0.79 0.86 0.81   Estimated Creatinine Clearance: 73.2 mL/min (by C-G formula based on Cr of 0.81).  Medical History: Past Medical History  Diagnosis Date  . CHF (congestive heart failure)   . Diabetes mellitus without complication   . COPD (chronic obstructive pulmonary disease)   . Hypertension   . Asthma   . Shortness of breath   . Stroke     2013  . Anxiety   . Pneumonia   . Gout   . 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 Anticoagulant long-term use    Medications:  Medications Prior to Admission  Medication Sig Dispense Refill  . allopurinol (ZYLOPRIM) 300 MG tablet TAKE ONE (1) TABLET EACH DAY 30 tablet 2  . Amino Acids-Protein Hydrolys (FEEDING SUPPLEMENT, PRO-STAT SUGAR FREE 64,) LIQD Take 30 mLs by mouth 2 (two) times daily.    Marland Kitchen atorvastatin (LIPITOR) 40 MG tablet TAKE ONE (1) TABLET EACH DAY 30 tablet 0  . baclofen (LIORESAL) 10 MG tablet TAKE 1/2 TABLET 3 TIMES DAILY 45 each 0  . feeding supplement (GLUCERNA SHAKE) LIQD Take 237 mLs by mouth 3 (three) times daily between meals. (Patient taking differently: Take 120 mLs by mouth See admin instructions. Given with medications per patient request per [facility]) 60 Can 1  . furosemide (LASIX) 20 MG tablet Take 20 mg by mouth every other day.     . gabapentin (NEURONTIN) 100 MG capsule Take 100  mg by mouth 2 (two) times daily.    Marland Kitchen gabapentin (NEURONTIN) 400 MG capsule TAKE 1 CAPSULE DAILY EVERY EVENING 30 capsule 2  . HYDROcodone-acetaminophen (NORCO) 7.5-325 MG per tablet Take one tablet by mouth twice daily as needed for pain. Do not exceed 3gm of APAP/24 form all sources. (Patient taking differently: Take 1 tablet by mouth 2 (two) times daily as needed for moderate pain or severe pain. Take one tablet by mouth twice daily as needed for pain. Do not exceed 3gm of APAP/24 form all sources.) 60 tablet 0  . hydrocortisone (ANUSOL-HC) 2.5 % rectal cream Place 1 application rectally 4 (four) times daily as needed for hemorrhoids or itching.    . loratadine (CLARITIN) 10 MG tablet TAKE ONE (1) TABLET EACH DAY 30 tablet 10  . losartan (COZAAR) 25 MG tablet TAKE ONE (1) TABLET EACH DAY 30 tablet 2  . metFORMIN (GLUCOPHAGE) 500 MG tablet TAKE ONE TABLET TWICE A DAY WITH FOOD 60 tablet 0  . metoprolol tartrate (LOPRESSOR) 25 MG tablet TAKE 1/2 TABLET TWICE DAILY 30 tablet 2  . montelukast (SINGULAIR) 10 MG tablet TAKE 1 TABLET DAILY IN THE EVENING 30 tablet 1  . warfarin (COUMADIN) 4 MG tablet Take 4 mg by mouth every evening.     Assessment: 74yo female on chronic Coumadin PTA.  INR therapeutic on admission but has  now trended up to SUPRAtherapeutic range.  Home dose reportedly 4mg  daily.  Increase in INR could be due to acute illness, has started to trend down. CBC appears stable although low.  GI is following.  No overt bleed reported.  Goal of Therapy:  INR 2-3   Plan:  HOLD coumadin again today INR daily  [name] 01/08/2015,9:01 AM

## 2015-01-08 NOTE — Progress Notes (Signed)
[facility] PROGRESS NOTE  Brittany Clarke RUE:[id number] DOB: 1940/11/30 DOA: 01/01/2015 PCP: Brittany Honour, MD    Code Status: Full code Family Communication: Discussed with daughter on 8/18. Disposition Plan: Discharged home when clinically appropriate.   Consultants:  GI  Procedures: 2-D echocardiogram on 8/1 17/16.  - Left ventricle: The cavity size was normal. Wall thickness was normal. Systolic function was vigorous. The estimated ejection fraction was in the range of 65% to 70%. Wall motion was normal; there were no regional wall motion abnormalities. Doppler parameters are consistent with abnormal left ventricular relaxation (grade 1 diastolic dysfunction). - Aortic valve: Valve area (VTI): 3.45 cm^2. Valve area (Vmax): 2.91 cm^2. - Pulmonic valve: There was moderate regurgitation. - Pulmonary arteries: PA peak pressure: 34 mm Hg (S). PASP is borderline elevated. - Echocontrast is used to enhance visualization.  Antibiotics:  Vancomycin 8/15>>01/07/15  Zosyn 8/15>>01/07/15  HPI/Subjective: Patient has no complaints of chest pain, shortness of breath, cough, or diarrhea. She is wanting to know when she'll go home.  Objective: Filed Vitals:   01/08/15 1116  BP: 114/59  Pulse: 108  Temp: 99.9 F (37.7 C)  Resp: 18   oxygen saturation 94% on room air.  Intake/Output Summary (Last 24 hours) at 01/08/15 1259 Last data filed at 01/08/15 0905  Gross per 24 hour  Intake    120 ml  Output      6 ml  Net    114 ml   Filed Weights   01/01/15 1508 01/01/15 2102 01/02/15 0500  Weight: 102.059 kg (225 lb) 101.5 kg (223 lb 12.3 oz) 101.9 kg (224 lb 10.4 oz)    Exam:   General:  Pleasant obese 74 year old African-American woman in no acute distress.  Cardiovascular: S1, S2, with a soft systolic murmur.  Respiratory: Clear anteriorly with decreased breath sounds in the bases.  Abdomen: Obese, positive bowel sounds, soft, nontender,  nondistended.  Musculoskeletal/extremities: Trace of pedal edema. No acute hot red joints.  Neurologic: She is alert and oriented to herself, hospital, and president. Her speech is clear.   Data Reviewed: Basic Metabolic Panel:  Recent Labs Lab 01/04/15 0545 01/05/15 0539 01/06/15 0517 01/07/15 0533 01/08/15 0536  NA 136 135 135 136 136  K 3.7 3.5 3.1* 4.0 3.8  CL 103 103 102 103 102  CO2 24 24 25 25 26   GLUCOSE 102* 118* 110* 108* 80  BUN 21* 15 15 18 19   CREATININE 0.59 0.65 0.79 0.86 0.81  CALCIUM 9.6 9.3 9.4 9.5 9.8  MG  --   --  1.0* 1.5* 1.5*   Liver Function Tests:  Recent Labs Lab 01/02/15 0636 01/07/15 0533  AST 34 39  ALT 14 12*  ALKPHOS 68 59  BILITOT 1.5* 1.9*  PROT 6.2* 5.8*  ALBUMIN 3.1* 2.8*   No results for input(s): LIPASE, AMYLASE in the last 168 hours. No results for input(s): AMMONIA in the last 168 hours. CBC:  Recent Labs Lab 01/04/15 0545 01/05/15 0539 01/06/15 0517 01/07/15 0533 01/08/15 0536  WBC 5.7 6.1 6.9 7.2 6.6  HGB 8.5* 8.3* 8.5* 8.4* 8.2*  HCT 25.8* 25.3* 25.8* 25.4* 25.3*  MCV 83.5 83.0 82.7 82.2 82.4  PLT 84* 71* 73* 78* 74*   Cardiac Enzymes: No results for input(s): CKTOTAL, CKMB, CKMBINDEX, TROPONINI in the last 168 hours. BNP (last 3 results) No results for input(s): BNP in the last 8760 hours.  ProBNP (last 3 results) No results for input(s): PROBNP in the last 8760 hours.  CBG:  Recent Labs Lab 01/03/15 1145 01/03/15 1627 01/07/15 2142  GLUCAP 135* 92 95    Recent Results (from the past 240 hour(s))  C difficile quick scan w PCR reflex     Status: None   Collection Time: 01/01/15  3:06 PM  Result Value Ref Range Status   C Diff antigen NEGATIVE NEGATIVE Final   C Diff toxin NEGATIVE NEGATIVE Final   C Diff interpretation Negative for toxigenic C. difficile  Final  MRSA PCR Screening     Status: None   Collection Time: 01/02/15  9:30 PM  Result Value Ref Range Status   MRSA by PCR NEGATIVE  NEGATIVE Final    Comment:        The GeneXpert MRSA Assay (FDA approved for NASAL specimens only), is one component of a comprehensive MRSA colonization surveillance program. It is not intended to diagnose MRSA infection nor to guide or monitor treatment for MRSA infections.   Culture, blood (x 2)     Status: None (Preliminary result)   Collection Time: 01/04/15  7:28 AM  Result Value Ref Range Status   Specimen Description BLOOD LEFT HAND  Final   Special Requests BOTTLES DRAWN AEROBIC AND ANAEROBIC 5CC  Final   Culture NO GROWTH 4 DAYS  Final   Report Status PENDING  Incomplete  Culture, blood (x 2)     Status: None (Preliminary result)   Collection Time: 01/04/15  7:30 AM  Result Value Ref Range Status   Specimen Description BLOOD LEFT WRIST  Final   Special Requests BOTTLES DRAWN AEROBIC AND ANAEROBIC 5CC  Final   Culture NO GROWTH 4 DAYS  Final   Report Status PENDING  Incomplete  Urine culture     Status: None   Collection Time: 01/04/15  3:15 PM  Result Value Ref Range Status   Specimen Description URINE, CATHETERIZED  Final   Special Requests NONE  Final   Culture   Final    NO GROWTH 2 DAYS Performed at [facility]    Report Status 01/06/2015 FINAL  Final  Rapid strep screen (not at [facility])     Status: None   Collection Time: 01/06/15  3:20 PM  Result Value Ref Range Status   Streptococcus, Group A Screen (Direct) NEGATIVE NEGATIVE Final    Comment: (NOTE) A Rapid Antigen test may result negative if the antigen level in the sample is below the detection level of this test. The FDA has not cleared this test as a stand-alone test therefore the rapid antigen negative result has reflexed to a Group A Strep culture.   Culture, Group A Strep     Status: None   Collection Time: 01/06/15  3:20 PM  Result Value Ref Range Status   Strep A Culture Negative  Final    Comment: (NOTE) Performed At: [facility] [place], [place]  [id number] [name] MD HW:[id number]      Studies: US Venous Img Lower Bilateral  01/07/2015   CLINICAL DATA:  Fever and bilateral lower extremity swelling  EXAM: BILATERAL LOWER EXTREMITY VENOUS DOPPLER ULTRASOUND  TECHNIQUE: Gray-scale sonography with graded compression, as well as color Doppler and duplex ultrasound were performed to evaluate the lower extremity deep venous systems from the level of the common femoral vein and including the common femoral, femoral, profunda femoral, popliteal and calf veins including the posterior tibial, peroneal and gastrocnemius veins when visible. The superficial great saphenous vein was also interrogated. Spectral Doppler was utilized to  evaluate flow at rest and with distal augmentation maneuvers in the common femoral, femoral and popliteal veins.  COMPARISON:  None.  FINDINGS: RIGHT LOWER EXTREMITY  Common Femoral Vein: No evidence of thrombus. Normal compressibility, respiratory phasicity and response to augmentation.  Saphenofemoral Junction: No evidence of thrombus. Normal compressibility and flow on color Doppler imaging.  Profunda Femoral Vein: No evidence of thrombus. Normal compressibility and flow on color Doppler imaging.  Femoral Vein: No evidence of thrombus. Normal compressibility, respiratory phasicity and response to augmentation.  Popliteal Vein: No evidence of thrombus. Normal compressibility, respiratory phasicity and response to augmentation.  Calf Veins: Not well visualized  Superficial Great Saphenous Vein: No evidence of thrombus. Normal compressibility and flow on color Doppler imaging.  Venous Reflux:  None.  Other Findings: Prominent lymph nodes are noted in in the right inguinal region.  LEFT LOWER EXTREMITY  Common Femoral Vein: No evidence of thrombus. Normal compressibility, respiratory phasicity and response to augmentation.  Saphenofemoral Junction: No evidence of thrombus. Normal compressibility and flow on color Doppler  imaging.  Profunda Femoral Vein: No evidence of thrombus. Normal compressibility and flow on color Doppler imaging.  Femoral Vein: No evidence of thrombus. Normal compressibility, respiratory phasicity and response to augmentation.  Popliteal Vein: No evidence of thrombus. Normal compressibility, respiratory phasicity and response to augmentation.  Calf Veins: Not well visualized  Superficial Great Saphenous Vein: No evidence of thrombus. Normal compressibility and flow on color Doppler imaging.  Venous Reflux:  None.  Other Findings: Prominent lymph nodes are noted in the left inguinal region  IMPRESSION: No evidence of deep venous thrombosis. Evaluation of the calf veins is limited. Mildly prominent lymph nodes are noted in the inguinal regions bilaterally. This is likely reactive in nature.   Electronically Signed   By: Inez Catalina M.D.   On: 01/07/2015 16:30    Scheduled Meds: . acidophilus  1 capsule Oral Daily  . allopurinol  300 mg Oral Daily  . atorvastatin  40 mg Oral q1800  . docusate sodium  100 mg Oral BID  . feeding supplement (GLUCERNA SHAKE)  237 mL Oral TID BM  . feeding supplement (PRO-STAT SUGAR FREE 64)  30 mL Oral BID  . furosemide  20 mg Oral Daily  . gabapentin  100 mg Oral BID  . hydrocortisone  25 mg Rectal BID  . metoprolol tartrate  12.5 mg Oral BID  . montelukast  10 mg Oral QPM  . pantoprazole  40 mg Oral Daily  . polyethylene glycol  17 g Oral Daily  . sodium chloride  3 mL Intravenous Q12H  . Warfarin - Pharmacist Dosing Inpatient   Does not apply Q24H   Continuous Infusions:    Assessment and plan:  Principal Problem:   Acute encephalopathy Active Problems:   FUO (fever of unknown origin)   Thrombocytopenia   Elevated INR   Warfarin-induced coagulopathy   Hypokalemia   Hypomagnesemia   Morbid obesity   History of CVA (cerebrovascular accident)   Anemia   HTN (hypertension)   DM II (diabetes mellitus, type II), controlled   COPD (chronic  obstructive pulmonary disease)   Chronic anticoagulation   Altered mental status   Dehydration   Blood in stool   AKI (acute kidney injury)   Brief history:  Patient is a 74 year old woman with a history of diabetes mellitus, obesity, hypertension. 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 Apparently, she was started on Coumadin at that  time. She was hospitalized at [facility] and then went to [facility], then went to a nursing facility in [facility], and then returned home. While at home, she deteriorated and her family transferred her to the [facility] for rehabilitation on 12/14/2014. On 01/01/2015, she presented to the ED for altered mental status and lethargy. In the ED, she was afebrile and her blood pressure was on the lower end of normal, but otherwise seemed dynamically stable. Her lab data were significant for BUN of 63, creatinine of 1.86, platelet count of 79, and a relatively unremarkable urinalysis. CT of her head revealed encephalomalacia in the old left MCA distribution, small vessel ischemic changes, but no acute intracranial abnormality. Her chest x-ray revealed no acute changes. -Note, her family believed that she was receiving too much medication for pain which could've been contributing to her lethargy. She was admitted for further evaluation and management.   1. Acute encephalopathy. Currently resolved status post treatment of dehydration and acute renal failure. Etiology likely from dehydration and acute renal failure, but underlying encephalopathy from opiate medications and/or depression from being in the SNF could be playing a part  Fever of unknown origin. Patient was afebrile on admission, but she developed a fever. Blood cultures were ordered and are negative to date. Urine culture was ordered and is negative today. Additional studies were ordered and revealed unremarkable chest x-ray, negative venous Doppler  ultrasound of the lower extremities, negative influenza panel, negative rapid strep test, and a 2-D echocardiogram revealed no significant valvular abnormalities. -Patient was started on broad-spectrum antibiotic therapy with Zosyn and vancomycin. She still spiked through these antibiotics. -Antibiotics were discontinued and the patient is currently being monitored. -She had become afebrile, but her temperature is now trending up again. We'll continue supportive treatment without restarting antibiotics for now. There appears to be no obvious source of fever.  Supratherapeutic INR/warfarin-induced coagulopathy. Patient's INR increased to a high of 7.32. She is chronically anticoagulated with Coumadin due to a previous stroke. Coumadin is currently on hold. - Her INR was 2.19 on 01/01/2015. She has a history of supratherapeutic INR at the SNF with a high of 7.99 on 12/29/14. The follow-up INR the next day on 12/30/14 was 1.52; apparently vitamin K was given at the SNF. -Her INR is trending downward off of antibiotics. We'll continue to hold Coumadin.  Chronic anemia of chronic disease, superimposed on possible chronic GI bleeding. -Her colonoscopy in 2014 revealed diverticulosis with some suspicion for diverticular bleed. EGD revealed H. 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 Patient has a history of chronic anemia.Patient's baseline hemoglobin appears to be approximately 10. It has drifted down slowly to the 8-8.5 range. Anemia panel revealed a total iron of 31, TIBC 265, normal vitamin B12. - She is chronically anticoagulated on Coumadin for history of a previous stroke. She was found to have a guaiac positive stool following admission. GI was consulted. They did not recommend further inpatient evaluation unless she developed gross bleeding. They believe that she may have chronic low grade hematochezia from her hemorrhoids. Therefore, Anusol cream twice a day was  ordered. -Protonix was added empirically.  Thrombocytopenia. The patient's baseline platelet count is 107-126 per labs in July 2016. On admission, it decreased to 75. So far the range has been 70-75. Vitamin B12 level and TSH were ordered for evaluation and will both within normal limits. -Etiology of the patient's thrombocytopenia is unclear as she may have a subacute myelodysplastic syndrome, but this  cannot be confirmed. -Her platelet count has been stable in the 70s over the past several days. We'll continue to monitor.  Acute kidney injury, secondary to prerenal azotemia. Resolved. Home dose of Lasix was resumed.  Diabetes mellitus, type II. Patient's capillary blood glucose has been relatively controlled. Her hemoglobin A1c is 6.3. We'll continue sliding scale NovoLog.   HTN.  Patient's blood pressure was relatively low normal on admission. Following IV fluid hydration, it has improved. Metoprolol has been restarted, but her ARB is still being held.  Time spent: 30 minutes.    [facility] Pager [phone] . If 7PM-7AM, please contact night-coverage at [email or web address], password [facility] 01/08/2015, 12:59 PM  LOS: 7 days

## 2015-01-08 NOTE — Progress Notes (Signed)
Update: Dr. Caryn Section requested pharmacy call [facility] concerning elevated INR on 12/29/14 (7.99) and INR on 12/30/14 (1.52). MD concerned if Vit K given (or anything else). After talking with [facility], discovered Vit K 2.5 mg po given on 12/29/14, no other actions taken. Send message to Dr. Caryn Section with findings  Abner Greenspan, Ariel Dimitri [facility]

## 2015-01-08 NOTE — Progress Notes (Signed)
CRITICAL VALUE ALERT  Critical value received:  INR 5.79  Date of notification:  01/08/15  Time of notification:  *0735  Critical value read back:Yes  Nurse who received alert:  Vista Deck  MD notified (1st page):  Dr. Caryn Section  Time of first page:  [phone]

## 2015-01-08 NOTE — Progress Notes (Signed)
Subjective: States she's feeling better today. Denies abdominal pain. No signs/symptoms of overt GI bleed per patient or staff.  Objective: Vital signs in last 24 hours: Temp:  [98 F (36.7 C)-99.9 F (37.7 C)] 99.9 F (37.7 C) (08/19 1116) Pulse Rate:  [92-108] 108 (08/19 1116) Resp:  [18] 18 (08/19 1116) BP: (98-127)/(48-59) 114/59 mmHg (08/19 1116) SpO2:  [95 %-97 %] 95 % (08/19 1116) Last BM Date: 01/07/15 General:   Alert and oriented, pleasant Head:  Normocephalic and atraumatic. Heart:  S1, S2 present, no murmurs noted.  Lungs: Clear to auscultation bilaterally, without wheezing, rales, or rhonchi.  Abdomen:  Bowel sounds present, soft, rounded, non-tender, non-distended. No HSM or hernias noted. No rebound or guarding. No masses appreciated  Neurologic:  Alert and  oriented x4;  grossly normal neurologically. Skin:  Warm and dry, intact without significant lesions.  Psych:  Alert and cooperative. Normal mood and affect.  Intake/Output from previous day: 08/18 0701 - 08/19 0700 In: 410 [P.O.:360; IV Piggyback:50] Out: 9 [Urine:6; Stool:3] Intake/Output this shift: Total I/O In: 0  Out: 1 [Urine:1]  Lab Results:  Recent Labs  01/06/15 0517 01/07/15 0533 01/08/15 0536  WBC 6.9 7.2 6.6  HGB 8.5* 8.4* 8.2*  HCT 25.8* 25.4* 25.3*  PLT 73* 78* 74*   BMET  Recent Labs  01/06/15 0517 01/07/15 0533 01/08/15 0536  NA 135 136 136  K 3.1* 4.0 3.8  CL 102 103 102  CO2 25 25 26   GLUCOSE 110* 108* 80  BUN 15 18 19   CREATININE 0.79 0.86 0.81  CALCIUM 9.4 9.5 9.8   LFT  Recent Labs  01/07/15 0533  PROT 5.8*  ALBUMIN 2.8*  AST 39  ALT 12*  ALKPHOS 59  BILITOT 1.9*   PT/INR  Recent Labs  01/07/15 0533 01/08/15 0536  LABPROT 59.9* 50.2*  INR 7.32* 5.79*   Hepatitis Panel No results for input(s): HEPBSAG, HCVAB, HEPAIGM, HEPBIGM in the last 72 hours.   Studies/Results: US Venous Img Lower Bilateral  01/07/2015   CLINICAL DATA:  Fever and  bilateral lower extremity swelling  EXAM: BILATERAL LOWER EXTREMITY VENOUS DOPPLER ULTRASOUND  TECHNIQUE: Gray-scale sonography with graded compression, as well as color Doppler and duplex ultrasound were performed to evaluate the lower extremity deep venous systems from the level of the common femoral vein and including the common femoral, femoral, profunda femoral, popliteal and calf veins including the posterior tibial, peroneal and gastrocnemius veins when visible. The superficial great saphenous vein was also interrogated. Spectral Doppler was utilized to evaluate flow at rest and with distal augmentation maneuvers in the common femoral, femoral and popliteal veins.  COMPARISON:  None.  FINDINGS: RIGHT LOWER EXTREMITY  Common Femoral Vein: No evidence of thrombus. Normal compressibility, respiratory phasicity and response to augmentation.  Saphenofemoral Junction: No evidence of thrombus. Normal compressibility and flow on color Doppler imaging.  Profunda Femoral Vein: No evidence of thrombus. Normal compressibility and flow on color Doppler imaging.  Femoral Vein: No evidence of thrombus. Normal compressibility, respiratory phasicity and response to augmentation.  Popliteal Vein: No evidence of thrombus. Normal compressibility, respiratory phasicity and response to augmentation.  Calf Veins: Not well visualized  Superficial Great Saphenous Vein: No evidence of thrombus. Normal compressibility and flow on color Doppler imaging.  Venous Reflux:  None.  Other Findings: Prominent lymph nodes are noted in in the right inguinal region.  LEFT LOWER EXTREMITY  Common Femoral Vein: No evidence of thrombus. Normal compressibility, respiratory phasicity and response  to augmentation.  Saphenofemoral Junction: No evidence of thrombus. Normal compressibility and flow on color Doppler imaging.  Profunda Femoral Vein: No evidence of thrombus. Normal compressibility and flow on color Doppler imaging.  Femoral Vein: No evidence  of thrombus. Normal compressibility, respiratory phasicity and response to augmentation.  Popliteal Vein: No evidence of thrombus. Normal compressibility, respiratory phasicity and response to augmentation.  Calf Veins: Not well visualized  Superficial Great Saphenous Vein: No evidence of thrombus. Normal compressibility and flow on color Doppler imaging.  Venous Reflux:  None.  Other Findings: Prominent lymph nodes are noted in the left inguinal region  IMPRESSION: No evidence of deep venous thrombosis. Evaluation of the calf veins is limited. Mildly prominent lymph nodes are noted in the inguinal regions bilaterally. This is likely reactive in nature.   Electronically Signed   By: Inez Catalina M.D.   On: 01/07/2015 16:30    Assessment: Pleasant 74 year old lady with multiple medical problems including history of a CVA admitted with decline in mental status in the setting of acute renal failure. History of chronic anemia with drift in Hgb, heme positive stool, and reports of chronic low-volume hematochezia in the setting of chronic anticoagulation. No concerning GI bleeding per nursing and staff. Colonoscopy fairly up-to-date as of 2014. INR remains supratherapeutic although it is improving somewhat. H/H is stable. Thrombocytopenia, stable over the last 2 months with no acute drop this admission.  Chronic dysphagia: since stroke in 2013. No acute symptoms. Consider speech evaluation as outpatient if any worsening symptoms. EGD on file from 2014 with empiric dilation.     Plan: 1. Continued supportive measures. 2. Notify if any GI bleed 3. Follow-up with GI as outpatient 4. We will sign off at this time, consult if ay further GI needs.   Walden Field, AGNP-C Adult & Gerontological Nurse Practitioner [facility]    LOS: 7 days    01/08/2015, 1:04 PM

## 2015-01-08 NOTE — Care Management Important Message (Signed)
Important Message  Patient Details  Name: Krystian Ferrentino MRN: [id number] Date of Birth: 1940-08-20   Medicare Important Message Given:  Yes-third notification given    Joylene Draft, RN 01/08/2015, 8:52 AM

## 2015-01-09 LAB — CBC
HEMATOCRIT: 25.8 % — AB (ref 36.0–46.0)
Hemoglobin: 8.6 g/dL — ABNORMAL LOW (ref 12.0–15.0)
MCH: 27.5 pg (ref 26.0–34.0)
MCHC: 33.3 g/dL (ref 30.0–36.0)
MCV: 82.4 fL (ref 78.0–100.0)
PLATELETS: 76 10*3/uL — AB (ref 150–400)
RBC: 3.13 MIL/uL — AB (ref 3.87–5.11)
RDW: 19.6 % — ABNORMAL HIGH (ref 11.5–15.5)
WBC: 6.8 10*3/uL (ref 4.0–10.5)

## 2015-01-09 LAB — GLUCOSE, CAPILLARY: Glucose-Capillary: 87 mg/dL (ref 65–99)

## 2015-01-09 LAB — CULTURE, BLOOD (ROUTINE X 2)
CULTURE: NO GROWTH
Culture: NO GROWTH

## 2015-01-09 LAB — PROTIME-INR
INR: 4.32 — ABNORMAL HIGH (ref 0.00–1.49)
Prothrombin Time: 40.3 seconds — ABNORMAL HIGH (ref 11.6–15.2)

## 2015-01-09 MED ORDER — FUROSEMIDE 20 MG PO TABS: 20.0000 mg | ORAL_TABLET | ORAL | Status: DC

## 2015-01-09 MED ORDER — WARFARIN SODIUM 4 MG PO TABS: ORAL_TABLET | ORAL | Status: DC

## 2015-01-09 MED ORDER — HYDROCODONE-ACETAMINOPHEN 7.5-325 MG PO TABS: ORAL_TABLET | ORAL | Status: DC

## 2015-01-09 MED ORDER — PANTOPRAZOLE SODIUM 40 MG PO TBEC: 40.0000 mg | DELAYED_RELEASE_TABLET | Freq: Every day | ORAL | Status: DC

## 2015-01-09 MED ORDER — GABAPENTIN 100 MG PO CAPS: 100.0000 mg | ORAL_CAPSULE | Freq: Two times a day (BID) | ORAL | Status: DC

## 2015-01-09 MED ORDER — POLYETHYLENE GLYCOL 3350 17 G PO PACK: 17.0000 g | PACK | Freq: Every day | ORAL | Status: DC

## 2015-01-09 MED ORDER — HYDROCORTISONE 2.5 % RE CREA: 1.0000 "application " | TOPICAL_CREAM | Freq: Two times a day (BID) | RECTAL | Status: DC

## 2015-01-09 NOTE — Progress Notes (Signed)
Called pt's daughter to inform her of patient's discharge. Pt's daughter was asked if pt should be transported via [facility] and patient's daughter declined. Pt's daughter stated that both herself and her brother would transport patient via personal vehicle.

## 2015-01-09 NOTE — Progress Notes (Signed)
Pt's IV catheter removed and intact. Pt's IV site clean dry and intact. Discharge instructions, medications, and follow up appointments reviewed and discussed with pt's son and daughter. All questions answered and no further questions at this time. Pt in stable condition and in no acute distress at this time. Pt escorted by nurse tech.

## 2015-01-09 NOTE — Discharge Summary (Signed)
Physician Discharge Summary  Brittany Clarke EHU:[id number] DOB: 1941-04-12 DOA: 01/01/2015  PCP: [name], MD  Admit date: 01/01/2015 Discharge date: 01/09/2015  Time spent: Greater than 30 minutes  Recommendations for Outpatient Follow-up:  1. Recommend close follow-up of the patient's INR as it had been supratherapeutic during the hospital course. Consider stopping warfarin and treating patient with antiplatelets therapy. 2. Recommend follow-up of the patient's hemoglobin/hematocrit and platelet count.  3.   Discharge Diagnoses:  1. Acute encephalopathy, likely from dehydration and acute kidney injury. 2. Fever of unknown origin. 3. Supratherapeutic INR with warfarin induced coagulopathy. Patient's INR was 4.32 at the time of discharge. 4. History of left brain stroke with right-sided hemiparesis in 2013-treated with warfarin. 5. Chronic blood loss anemia, superimposed on anemia of chronic disease. 6. History of diverticulosis and H. pylori gastritis in 2014. 7. Subacute/chronic thrombocytopenia. 8. Acute kidney injury secondary to prerenal azotemia. 9. Type 2 diabetes mellitus. Hemoglobin A1c was 6.3. 10. Essential hypertension with low-normal blood pressures during hospitalization. 11. Hypokalemia and hypomagnesemia. 12. COPD. Remained stable. 13. Morbid obesity. 14. Chronic deconditioning.   Discharge Condition: Improved.  Diet recommendation: Heart healthy/carbohydrate modified.  Filed Weights   01/01/15 1508 01/01/15 2102 01/02/15 0500  Weight: 102.059 kg (225 lb) 101.5 kg (223 lb 12.3 oz) 101.9 kg (224 lb 10.4 oz)    History of present illness:  Patient is a 74 year old woman with a history of diabetes mellitus, obesity, hypertension. 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 Apparently, she was started on Coumadin at that time. She was hospitalized at [facility] and then went to [facility], then went to a  nursing facility in [facility], and then returned home. While at home, she deteriorated and her family transferred her to the [facility] for rehabilitation on 12/14/2014. On 01/01/2015, she presented to the ED for altered mental status and lethargy. In the ED, she was afebrile and her blood pressure was on the lower end of normal, but otherwise hemodynamically stable. Her lab data were significant for BUN of 63, creatinine of 1.86, platelet count of 79, and a relatively unremarkable urinalysis. CT of her head revealed encephalomalacia in the old left MCA distribution, small vessel ischemic changes, but no acute intracranial abnormality. Her chest x-ray revealed no acute changes. -Note, her family believed that she was receiving too much medication for pain which could've been contributing to her lethargy. She was admitted for further evaluation and management.  Hospital Course:   1. Acute encephalopathy. The patient was started on IV fluids for hydration. Her chronic opiates were withheld and changed to when necessary. Her TSH and vitamin B12 were assessed for deficiency and both were within normal limits. Following IV fluid hydration and supportive treatment, her encephalopathy completely resolved. Fever of unknown origin. Patient was afebrile on admission, but she developed a fever. Blood cultures were ordered and negative 5 days. Urine culture was ordered and was negative as well.  Additional studies were ordered and revealed unremarkable chest x-ray, negative venous Doppler ultrasound of the lower extremities, negative influenza panel, negative rapid strep test, and the 2-D echocardiogram revealed no significant valvular abnormalities. -Patient was started on broad-spectrum antibiotic therapy with Zosyn and vancomycin. She still spiked through these antibiotics. -Antibiotics were discontinued and the patient was monitored off of them. -She had become afebrile, but her temperature spiked briefly to  100.3. It trended down and was within normal limits at the time of discharge. Etiology of  her fever was unknown. Supratherapeutic INR/warfarin-induced coagulopathy. Patient's INR increased to a high of 7.32. She is chronically anticoagulated with Coumadin due to a previous stroke. Coumadin was withheld and her INR was monitored daily. Of note, her INR was 2.19 on 01/01/2015. She has a history of supratherapeutic INR at the SNF with a high of 7.99 on 12/29/14. The follow-up INR at the SNF the next day on 12/30/14 was 1.52; apparently vitamin K was given at the SNF. -Her INR trended downward to 4.32 without any intervention other than holding the Coumadin. There was no evidence of gross bleeding. -Would recommend a consideration for stopping Coumadin and treating the patient with antiplatelets therapy. However, this decision will be deferred to her PCP. I discussed this with her family. At the time of discharge, I also informed them that she would need to have her INR rechecked in 3-5 days to decide if Coumadin should be restarted. Chronic anemia of chronic disease, superimposed on possible chronic GI bleeding. -Her colonoscopy in 2014 revealed diverticulosis with some suspicion for diverticular bleed. EGD revealed H. 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 Patient has a history of chronic anemia. Her baseline hemoglobin appears to be approximately 10. It had drifted down slowly to the 8-8.5 range. Anemia panel revealed a total iron of 31, TIBC 265, normal vitamin B12. - She is chronically anticoagulated on Coumadin for history of a previous stroke. She was found to have a guaiac positive stool following admission. GI was consulted. They did not recommend further inpatient evaluation unless she developed gross bleeding. They believed that she may have chronic low grade hematochezia from her hemorrhoids. Therefore, Anusol cream twice a day was ordered. Protonix was added  empirically. Thrombocytopenia. The patient's baseline platelet count is 107-126 per labs in July 2016. On admission, it decreased to 75. So far the range has been 70-78. Vitamin B12 level and TSH were ordered for evaluation and will both within normal limits. -Etiology of the patient's thrombocytopenia was unclear as she may have a subacute myelodysplastic syndrome, but this could not be confirmed. Acute kidney injury, secondary to prerenal azotemia. The patient was started on IV fluids and her Lasix was withheld following admission. Her renal function improved with a creatinine that normalized to 0.81. Lasix was restarted upon discharge. Diabetes mellitus, type II. Metformin was withheld and the patient was treated with sliding scale NovoLog. Her capillary blood glucose was relatively controlled during the hospital course. Her hemoglobin A1c is 6.3. She was instructed to resume metformin at the time of discharge.  HTN.  Patient's blood pressure was relatively low normal on admission. Following IV fluid hydration, it had improved. Metoprolol was restarted while losartan was withheld. At discharge, she was instructed to restart both as her blood pressure had improved.     Procedures: 2-D echocardiogram on 8/1 17/16.  - Left ventricle: The cavity size was normal. Wall thickness was normal. Systolic function was vigorous. The estimated ejection fraction was in the range of 65% to 70%. Wall motion was normal; there were no regional wall motion abnormalities. Doppler parameters are consistent with abnormal left ventricular relaxation (grade 1 diastolic dysfunction). - Aortic valve: Valve area (VTI): 3.45 cm^2. Valve area (Vmax): 2.91 cm^2. - Pulmonic valve: There was moderate regurgitation. - Pulmonary arteries: PA peak pressure: 34 mm Hg (S). PASP is borderline elevated. - Echocontrast is used to enhance visualization.  Consultations:  Gastroenterology  Discharge  Exam: Filed Vitals:   01/09/15 1358  BP: 114/52  Pulse: 86  Temp: 99.1 F (37.3 C)  Resp: 18   oxygen saturation 96% on room air.   General: Pleasant obese 74 year old African-American woman in no acute distress.  Cardiovascular: S1, S2, with a soft systolic murmur.  Respiratory: Clear anteriorly with decreased breath sounds in the bases.  Abdomen: Obese, positive bowel sounds, soft, nontender, nondistended.  Musculoskeletal/extremities: Trace of pedal edema. No acute hot red joints.  Neurologic: She is alert and oriented 3.  Discharge Instructions   Discharge Instructions    Diet - low sodium heart healthy    Complete by:  As directed      Diet Carb Modified    Complete by:  As directed      Discharge instructions    Complete by:  As directed   DO NOT RESTART WARFARIN YET BECAUSE YOUR BLOOD IS STILL "TOO THIN". YOU'LL NEED A FOLLOW-UP BLOOD TEST IN 3-5 DAYS BY YOUR PRIMARY CARE DOCTOR BEFORE TO DETERMINE IF IT NEEDS TO BE RESTARTED.     Increase activity slowly    Complete by:  As directed           Current Discharge Medication List    START taking these medications   Details  pantoprazole (PROTONIX) 40 MG tablet Take 1 tablet (40 mg total) by mouth daily. Qty: 30 tablet, Refills: 3    polyethylene glycol (MIRALAX / GLYCOLAX) packet Take 17 g by mouth daily.      CONTINUE these medications which have CHANGED   Details  furosemide (LASIX) 20 MG tablet Take 1 tablet (20 mg total) by mouth every other day. For fluid buildup Qty: 30 tablet, Refills: 3    gabapentin (NEURONTIN) 100 MG capsule Take 1 capsule (100 mg total) by mouth 2 (two) times daily. Qty: 60 capsule, Refills: 3    HYDROcodone-acetaminophen (NORCO) 7.5-325 MG per tablet Take one tablet by mouth twice daily as needed for pain. Do not exceed 3gm of APAP/24 form all sources. Qty: 40 tablet, Refills: 0   Associated Diagnoses: Charcot foot due to diabetes mellitus    hydrocortisone (ANUSOL-HC)  2.5 % rectal cream Place 1 application rectally 2 (two) times daily. Qty: 30 g, Refills: 3    warfarin (COUMADIN) 4 MG tablet DO NOT RESTART THIS MEDICATION UNTIL YOUR DOCTOR CHECKS YOUR BLOOD TEST (INR).      CONTINUE these medications which have NOT CHANGED   Details  allopurinol (ZYLOPRIM) 300 MG tablet TAKE ONE (1) TABLET EACH DAY Qty: 30 tablet, Refills: 2    Amino Acids-Protein Hydrolys (FEEDING SUPPLEMENT, PRO-STAT SUGAR FREE 64,) LIQD Take 30 mLs by mouth 2 (two) times daily.    atorvastatin (LIPITOR) 40 MG tablet TAKE ONE (1) TABLET EACH DAY Qty: 30 tablet, Refills: 0    feeding supplement (GLUCERNA SHAKE) LIQD Take 237 mLs by mouth 3 (three) times daily between meals. Qty: 60 Can, Refills: 1    loratadine (CLARITIN) 10 MG tablet TAKE ONE (1) TABLET EACH DAY Qty: 30 tablet, Refills: 10    losartan (COZAAR) 25 MG tablet TAKE ONE (1) TABLET EACH DAY Qty: 30 tablet, Refills: 2    metFORMIN (GLUCOPHAGE) 500 MG tablet TAKE ONE TABLET TWICE A DAY WITH FOOD Qty: 60 tablet, Refills: 0    metoprolol tartrate (LOPRESSOR) 25 MG tablet TAKE 1/2 TABLET TWICE DAILY Qty: 30 tablet, Refills: 2    montelukast (SINGULAIR) 10 MG tablet TAKE 1 TABLET DAILY IN THE EVENING Qty: 30 tablet, Refills: 1      STOP taking these  medications     baclofen (LIORESAL) 10 MG tablet        Allergies  Allergen Reactions  . Ultram [Tramadol] Shortness Of Breath   Follow-up Information    Follow up with [name], MD On 01/19/2015.   Specialty:  Family Medicine   Why:  at 4:00 pm   Contact information:   [place] [address] [phone]       Follow up with [place].   Contact information:   [address] [place] [address] [phone]        The results of significant diagnostics from this hospitalization (including imaging, microbiology, ancillary and laboratory) are listed below for reference.    Significant Diagnostic  Studies: Dg Abd 1 View  12/17/2014   CLINICAL DATA:  Constipation, history of stroke  EXAM: ABDOMEN - 1 VIEW  COMPARISON:  None.  FINDINGS: Aortic calcification. No abnormally dilated loops of bowel. Stool retained throughout the large bowel.  IMPRESSION: Moderate to large stool burden   Electronically Signed   By: Skipper Cliche M.D.   On: 12/17/2014 12:41   Ct Head Wo Contrast  01/01/2015   CLINICAL DATA:  Altered mental status  EXAM: CT HEAD WITHOUT CONTRAST  TECHNIQUE: Contiguous axial images were obtained from the base of the skull through the vertex without intravenous contrast.  COMPARISON:  None.  FINDINGS: No evidence of parenchymal hemorrhage or extra-axial fluid collection. No mass lesion, mass effect, or midline shift.  No CT evidence of acute infarction.  Encephalomalacic changes/hypodensity in the left frontoparietal region nodes (series 3/ images 25 and 19), compatible with prior left MCA distribution infarct.  Subcortical white matter and periventricular small vessel ischemic changes. Intracranial atherosclerosis.  Cerebral volume is within normal limits.  No ventriculomegaly.  New complete opacification of the left maxillary sinus. Mastoid air cells are clear.  No evidence of calvarial fracture.  IMPRESSION: No evidence of acute intracranial abnormality.  Encephalomalacic changes related to old left MCA distribution infarct.  Small vessel ischemic changes.   Electronically Signed   By: Julian Hy M.D.   On: 01/01/2015 19:09   US Venous Img Lower Bilateral  01/07/2015   CLINICAL DATA:  Fever and bilateral lower extremity swelling  EXAM: BILATERAL LOWER EXTREMITY VENOUS DOPPLER ULTRASOUND  TECHNIQUE: Gray-scale sonography with graded compression, as well as color Doppler and duplex ultrasound were performed to evaluate the lower extremity deep venous systems from the level of the common femoral vein and including the common femoral, femoral, profunda femoral, popliteal and calf veins  including the posterior tibial, peroneal and gastrocnemius veins when visible. The superficial great saphenous vein was also interrogated. Spectral Doppler was utilized to evaluate flow at rest and with distal augmentation maneuvers in the common femoral, femoral and popliteal veins.  COMPARISON:  None.  FINDINGS: RIGHT LOWER EXTREMITY  Common Femoral Vein: No evidence of thrombus. Normal compressibility, respiratory phasicity and response to augmentation.  Saphenofemoral Junction: No evidence of thrombus. Normal compressibility and flow on color Doppler imaging.  Profunda Femoral Vein: No evidence of thrombus. Normal compressibility and flow on color Doppler imaging.  Femoral Vein: No evidence of thrombus. Normal compressibility, respiratory phasicity and response to augmentation.  Popliteal Vein: No evidence of thrombus. Normal compressibility, respiratory phasicity and response to augmentation.  Calf Veins: Not well visualized  Superficial Great Saphenous Vein: No evidence of thrombus. Normal compressibility and flow on color Doppler imaging.  Venous Reflux:  None.  Other Findings:  Prominent lymph nodes are noted in in the right inguinal region.  LEFT LOWER EXTREMITY  Common Femoral Vein: No evidence of thrombus. Normal compressibility, respiratory phasicity and response to augmentation.  Saphenofemoral Junction: No evidence of thrombus. Normal compressibility and flow on color Doppler imaging.  Profunda Femoral Vein: No evidence of thrombus. Normal compressibility and flow on color Doppler imaging.  Femoral Vein: No evidence of thrombus. Normal compressibility, respiratory phasicity and response to augmentation.  Popliteal Vein: No evidence of thrombus. Normal compressibility, respiratory phasicity and response to augmentation.  Calf Veins: Not well visualized  Superficial Great Saphenous Vein: No evidence of thrombus. Normal compressibility and flow on color Doppler imaging.  Venous Reflux:  None.  Other  Findings: Prominent lymph nodes are noted in the left inguinal region  IMPRESSION: No evidence of deep venous thrombosis. Evaluation of the calf veins is limited. Mildly prominent lymph nodes are noted in the inguinal regions bilaterally. This is likely reactive in nature.   Electronically Signed   By: Inez Catalina M.D.   On: 01/07/2015 16:30   Dg Chest Port 1 View  01/06/2015   CLINICAL DATA:  Fever  EXAM: PORTABLE CHEST - 1 VIEW  COMPARISON:  January 04, 2015  FINDINGS: There is no edema or consolidation. Heart size and pulmonary vascularity are normal. No adenopathy. There are surgical clips in the right axillary region. No bone lesions.  IMPRESSION: No edema or consolidation.   Electronically Signed   By: Lowella Grip III M.D.   On: 01/06/2015 11:03   Dg Chest Port 1 View  01/04/2015   CLINICAL DATA:  Shortness of breath.  Sepsis.  EXAM: PORTABLE CHEST - 1 VIEW  COMPARISON:  01/01/2015  FINDINGS: Heart size is normal. Mediastinal shadows are normal except for calcification and unfolding of the thoracic aorta. The lungs are clear. The vascularity is normal. No effusions. No acute bone finding.  IMPRESSION: No active disease.   Electronically Signed   By: Nelson Chimes M.D.   On: 01/04/2015 07:15   Dg Chest Portable 1 View  01/01/2015   CLINICAL DATA:  Altered mental status, elevated creatinine  EXAM: PORTABLE CHEST - 1 VIEW  COMPARISON:  06/10/2012  FINDINGS: Lungs are essentially clear. No focal consolidation. No pleural effusion or pneumothorax.  Cardiomegaly.  Surgical clips in the right chest wall/axilla.  IMPRESSION: No evidence of acute cardiopulmonary disease.   Electronically Signed   By: Julian Hy M.D.   On: 01/01/2015 19:25    Microbiology: Recent Results (from the past 240 hour(s))  C difficile quick scan w PCR reflex     Status: None   Collection Time: 01/01/15  3:06 PM  Result Value Ref Range Status   C Diff antigen NEGATIVE NEGATIVE Final   C Diff toxin NEGATIVE NEGATIVE  Final   C Diff interpretation Negative for toxigenic C. difficile  Final  MRSA PCR Screening     Status: None   Collection Time: 01/02/15  9:30 PM  Result Value Ref Range Status   MRSA by PCR NEGATIVE NEGATIVE Final    Comment:        The GeneXpert MRSA Assay (FDA approved for NASAL specimens only), is one component of a comprehensive MRSA colonization surveillance program. It is not intended to diagnose MRSA infection nor to guide or monitor treatment for MRSA infections.   Culture, blood (x 2)     Status: None   Collection Time: 01/04/15  7:28 AM  Result Value Ref Range Status  Specimen Description BLOOD LEFT HAND  Final   Special Requests BOTTLES DRAWN AEROBIC AND ANAEROBIC 5CC  Final   Culture NO GROWTH 5 DAYS  Final   Report Status 01/09/2015 FINAL  Final  Culture, blood (x 2)     Status: None   Collection Time: 01/04/15  7:30 AM  Result Value Ref Range Status   Specimen Description BLOOD LEFT WRIST  Final   Special Requests BOTTLES DRAWN AEROBIC AND ANAEROBIC 5CC  Final   Culture NO GROWTH 5 DAYS  Final   Report Status 01/09/2015 FINAL  Final  Urine culture     Status: None   Collection Time: 01/04/15  3:15 PM  Result Value Ref Range Status   Specimen Description URINE, CATHETERIZED  Final   Special Requests NONE  Final   Culture   Final    NO GROWTH 2 DAYS Performed at [facility]    Report Status 01/06/2015 FINAL  Final  Rapid strep screen (not at [facility])     Status: None   Collection Time: 01/06/15  3:20 PM  Result Value Ref Range Status   Streptococcus, Group A Screen (Direct) NEGATIVE NEGATIVE Final    Comment: (NOTE) A Rapid Antigen test may result negative if the antigen level in the sample is below the detection level of this test. The FDA has not cleared this test as a stand-alone test therefore the rapid antigen negative result has reflexed to a Group A Strep culture.   Culture, Group A Strep     Status: None   Collection Time: 01/06/15  3:20  PM  Result Value Ref Range Status   Strep A Culture Negative  Final    Comment: (NOTE) Performed At: [facility] [place], [place] [id number] [name] MD XE:[id number]      Labs: Basic Metabolic Panel:  Recent Labs Lab 01/04/15 0545 01/05/15 0539 01/06/15 0517 01/07/15 0533 01/08/15 0536  NA 136 135 135 136 136  K 3.7 3.5 3.1* 4.0 3.8  CL 103 103 102 103 102  CO2 24 24 25 25 26   GLUCOSE 102* 118* 110* 108* 80  BUN 21* 15 15 18 19   CREATININE 0.59 0.65 0.79 0.86 0.81  CALCIUM 9.6 9.3 9.4 9.5 9.8  MG  --   --  1.0* 1.5* 1.5*   Liver Function Tests:  Recent Labs Lab 01/07/15 0533  AST 39  ALT 12*  ALKPHOS 59  BILITOT 1.9*  PROT 5.8*  ALBUMIN 2.8*   No results for input(s): LIPASE, AMYLASE in the last 168 hours. No results for input(s): AMMONIA in the last 168 hours. CBC:  Recent Labs Lab 01/05/15 0539 01/06/15 0517 01/07/15 0533 01/08/15 0536 01/09/15 0557  WBC 6.1 6.9 7.2 6.6 6.8  HGB 8.3* 8.5* 8.4* 8.2* 8.6*  HCT 25.3* 25.8* 25.4* 25.3* 25.8*  MCV 83.0 82.7 82.2 82.4 82.4  PLT 71* 73* 78* 74* 76*   Cardiac Enzymes: No results for input(s): CKTOTAL, CKMB, CKMBINDEX, TROPONINI in the last 168 hours. BNP: BNP (last 3 results) No results for input(s): BNP in the last 8760 hours.  ProBNP (last 3 results) No results for input(s): PROBNP in the last 8760 hours.  CBG:  Recent Labs Lab 01/03/15 1145 01/03/15 1627 01/07/15 2142 01/09/15 0003  GLUCAP 135* 92 95 87       Signed:  Charlynn Salih  [facility] 01/09/2015, 2:10 PM

## 2015-01-09 NOTE — Progress Notes (Signed)
[place] for Coumadin (chronic Rx PTA) Indication: stroke  Allergies  Allergen Reactions  . Ultram [Tramadol] Shortness Of Breath   Patient Measurements: Height: 5\' 5"  (165.1 cm) Weight: 224 lb 10.4 oz (101.9 kg) IBW/kg (Calculated) : 57  Vital Signs: Temp: 98.9 F (37.2 C) (08/20 0436) Temp Source: Oral (08/20 0436) BP: 106/53 mmHg (08/20 0436) Pulse Rate: 95 (08/20 0436)  Labs:  Recent Labs  01/07/15 0533 01/08/15 0536 01/09/15 0557  HGB 8.4* 8.2* 8.6*  HCT 25.4* 25.3* 25.8*  PLT 78* 74* 76*  LABPROT 59.9* 50.2* 40.3*  INR 7.32* 5.79* 4.32*  CREATININE 0.86 0.81  --    Estimated Creatinine Clearance: 73.2 mL/min (by C-G formula based on Cr of 0.81).  Medical History: Past Medical History  Diagnosis Date  . CHF (congestive heart failure)   . Diabetes mellitus without complication   . COPD (chronic obstructive pulmonary disease)   . Hypertension   . Asthma   . Shortness of breath   . Stroke     2013  . Anxiety   . Pneumonia   . Gout   . 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 Anticoagulant long-term use    Medications:  Medications Prior to Admission  Medication Sig Dispense Refill  . allopurinol (ZYLOPRIM) 300 MG tablet TAKE ONE (1) TABLET EACH DAY 30 tablet 2  . Amino Acids-Protein Hydrolys (FEEDING SUPPLEMENT, PRO-STAT SUGAR FREE 64,) LIQD Take 30 mLs by mouth 2 (two) times daily.    Marland Kitchen atorvastatin (LIPITOR) 40 MG tablet TAKE ONE (1) TABLET EACH DAY 30 tablet 0  . baclofen (LIORESAL) 10 MG tablet TAKE 1/2 TABLET 3 TIMES DAILY 45 each 0  . feeding supplement (GLUCERNA SHAKE) LIQD Take 237 mLs by mouth 3 (three) times daily between meals. (Patient taking differently: Take 120 mLs by mouth See admin instructions. Given [facility] medications [facility]) 60 Can 1  . furosemide (LASIX) 20 MG tablet Take 20 mg by mouth every other day.     . gabapentin (NEURONTIN) 100 MG capsule Take 100  mg by mouth 2 (two) times daily.    Marland Kitchen gabapentin (NEURONTIN) 400 MG capsule TAKE 1 CAPSULE DAILY EVERY EVENING 30 capsule 2  . HYDROcodone-acetaminophen (NORCO) 7.5-325 MG per tablet Take one tablet by mouth twice daily as needed for pain. Do not exceed 3gm of APAP/24 form all sources. (Patient taking differently: Take 1 tablet by mouth 2 (two) times daily as needed for moderate pain or severe pain. Take one tablet by mouth twice daily as needed for pain. Do not exceed 3gm of APAP/24 form all sources.) 60 tablet 0  . hydrocortisone (ANUSOL-HC) 2.5 % rectal cream Place 1 application rectally 4 (four) times daily as needed for hemorrhoids or itching.    . loratadine (CLARITIN) 10 MG tablet TAKE ONE (1) TABLET EACH DAY 30 tablet 10  . losartan (COZAAR) 25 MG tablet TAKE ONE (1) TABLET EACH DAY 30 tablet 2  . metFORMIN (GLUCOPHAGE) 500 MG tablet TAKE ONE TABLET TWICE A DAY WITH FOOD 60 tablet 0  . metoprolol tartrate (LOPRESSOR) 25 MG tablet TAKE 1/2 TABLET TWICE DAILY 30 tablet 2  . montelukast (SINGULAIR) 10 MG tablet TAKE 1 TABLET DAILY IN THE EVENING 30 tablet 1  . warfarin (COUMADIN) 4 MG tablet Take 4 mg by mouth every evening.     Assessment: 74yo female on chronic Coumadin PTA.  INR therapeutic on admission  but has now trended up to SUPRAtherapeutic range.  Increase in INR could be due to acute illness, has started to trend down. CBC stable although low.  GI signed off, no overt bleed reported.  Goal of Therapy:  INR 2-3   Plan:  HOLD coumadin again today INR daily  Pricilla Clarke 01/09/2015,10:51 AM

## 2015-01-11 ENCOUNTER — Inpatient Hospital Stay (HOSPITAL_COMMUNITY): Payer: Medicare Other

## 2015-01-11 ENCOUNTER — Telehealth: Payer: Self-pay | Admitting: Family Medicine

## 2015-01-11 ENCOUNTER — Emergency Department (HOSPITAL_COMMUNITY): Payer: Medicare Other

## 2015-01-11 ENCOUNTER — Inpatient Hospital Stay (HOSPITAL_COMMUNITY)
Admission: EM | Admit: 2015-01-11 | Discharge: 2015-01-15 | DRG: 071 | Disposition: A | Discharge status: Home Health | Payer: Medicare Other | Attending: Internal Medicine | Admitting: Internal Medicine

## 2015-01-11 ENCOUNTER — Encounter (HOSPITAL_COMMUNITY): Payer: Self-pay | Admitting: *Deleted

## 2015-01-11 DIAGNOSIS — R4020 Unspecified coma: Secondary | ICD-10-CM | POA: Diagnosis not present

## 2015-01-11 DIAGNOSIS — E860 Dehydration: Secondary | ICD-10-CM | POA: Diagnosis present

## 2015-01-11 DIAGNOSIS — I519 Heart disease, unspecified: Secondary | ICD-10-CM | POA: Diagnosis not present

## 2015-01-11 DIAGNOSIS — G5790 Unspecified mononeuropathy of unspecified lower limb: Secondary | ICD-10-CM | POA: Diagnosis present

## 2015-01-11 DIAGNOSIS — R4182 Altered mental status, unspecified: Secondary | ICD-10-CM | POA: Diagnosis not present

## 2015-01-11 DIAGNOSIS — I509 Heart failure, unspecified: Secondary | ICD-10-CM | POA: Diagnosis present

## 2015-01-11 DIAGNOSIS — R531 Weakness: Secondary | ICD-10-CM | POA: Diagnosis not present

## 2015-01-11 DIAGNOSIS — G9340 Encephalopathy, unspecified: Secondary | ICD-10-CM | POA: Diagnosis present

## 2015-01-11 DIAGNOSIS — Z8673 Personal history of transient ischemic attack (TIA), and cerebral infarction without residual deficits: Secondary | ICD-10-CM

## 2015-01-11 DIAGNOSIS — D649 Anemia, unspecified: Secondary | ICD-10-CM | POA: Diagnosis present

## 2015-01-11 DIAGNOSIS — D696 Thrombocytopenia, unspecified: Secondary | ICD-10-CM | POA: Diagnosis present

## 2015-01-11 DIAGNOSIS — E11610 Type 2 diabetes mellitus with diabetic neuropathic arthropathy: Secondary | ICD-10-CM | POA: Diagnosis present

## 2015-01-11 DIAGNOSIS — F419 Anxiety disorder, unspecified: Secondary | ICD-10-CM | POA: Diagnosis present

## 2015-01-11 DIAGNOSIS — J45909 Unspecified asthma, uncomplicated: Secondary | ICD-10-CM | POA: Diagnosis not present

## 2015-01-11 DIAGNOSIS — M109 Gout, unspecified: Secondary | ICD-10-CM | POA: Diagnosis not present

## 2015-01-11 DIAGNOSIS — J449 Chronic obstructive pulmonary disease, unspecified: Secondary | ICD-10-CM | POA: Diagnosis not present

## 2015-01-11 DIAGNOSIS — Z79899 Other long term (current) drug therapy: Secondary | ICD-10-CM | POA: Diagnosis not present

## 2015-01-11 DIAGNOSIS — Z888 Allergy status to other drugs, medicaments and biological substances status: Secondary | ICD-10-CM | POA: Diagnosis not present

## 2015-01-11 DIAGNOSIS — R0902 Hypoxemia: Secondary | ICD-10-CM | POA: Diagnosis not present

## 2015-01-11 DIAGNOSIS — E119 Type 2 diabetes mellitus without complications: Secondary | ICD-10-CM | POA: Diagnosis not present

## 2015-01-11 DIAGNOSIS — Z515 Encounter for palliative care: Secondary | ICD-10-CM

## 2015-01-11 DIAGNOSIS — D638 Anemia in other chronic diseases classified elsewhere: Secondary | ICD-10-CM | POA: Diagnosis not present

## 2015-01-11 DIAGNOSIS — N179 Acute kidney failure, unspecified: Secondary | ICD-10-CM | POA: Diagnosis not present

## 2015-01-11 DIAGNOSIS — Z833 Family history of diabetes mellitus: Secondary | ICD-10-CM | POA: Diagnosis not present

## 2015-01-11 DIAGNOSIS — Z7901 Long term (current) use of anticoagulants: Secondary | ICD-10-CM

## 2015-01-11 DIAGNOSIS — E861 Hypovolemia: Secondary | ICD-10-CM | POA: Diagnosis present

## 2015-01-11 DIAGNOSIS — G9341 Metabolic encephalopathy: Secondary | ICD-10-CM | POA: Diagnosis not present

## 2015-01-11 DIAGNOSIS — I10 Essential (primary) hypertension: Secondary | ICD-10-CM | POA: Diagnosis not present

## 2015-01-11 DIAGNOSIS — N390 Urinary tract infection, site not specified: Secondary | ICD-10-CM | POA: Diagnosis not present

## 2015-01-11 DIAGNOSIS — G4733 Obstructive sleep apnea (adult) (pediatric): Secondary | ICD-10-CM | POA: Diagnosis not present

## 2015-01-11 DIAGNOSIS — R40243 Glasgow coma scale score 3-8: Secondary | ICD-10-CM | POA: Diagnosis not present

## 2015-01-11 LAB — URINALYSIS, ROUTINE W REFLEX MICROSCOPIC
GLUCOSE, UA: NEGATIVE mg/dL
Hgb urine dipstick: NEGATIVE
KETONES UR: NEGATIVE mg/dL
Nitrite: NEGATIVE
PH: 5 (ref 5.0–8.0)
Urobilinogen, UA: 1 mg/dL (ref 0.0–1.0)

## 2015-01-11 LAB — COMPREHENSIVE METABOLIC PANEL
ALBUMIN: 3.1 g/dL — AB (ref 3.5–5.0)
ALK PHOS: 65 U/L (ref 38–126)
ALT: 16 U/L (ref 14–54)
AST: 47 U/L — AB (ref 15–41)
Anion gap: 11 (ref 5–15)
BILIRUBIN TOTAL: 2 mg/dL — AB (ref 0.3–1.2)
BUN: 38 mg/dL — AB (ref 6–20)
CALCIUM: 11.2 mg/dL — AB (ref 8.9–10.3)
CO2: 24 mmol/L (ref 22–32)
Chloride: 101 mmol/L (ref 101–111)
Creatinine, Ser: 1.57 mg/dL — ABNORMAL HIGH (ref 0.44–1.00)
GFR calc Af Amer: 37 mL/min — ABNORMAL LOW (ref >60)
GFR calc non Af Amer: 32 mL/min — ABNORMAL LOW (ref >60)
GLUCOSE: 96 mg/dL (ref 65–99)
POTASSIUM: 3.9 mmol/L (ref 3.5–5.1)
Sodium: 136 mmol/L (ref 135–145)
TOTAL PROTEIN: 6.2 g/dL — AB (ref 6.5–8.1)

## 2015-01-11 LAB — PROTIME-INR
INR: 1.98 — ABNORMAL HIGH (ref 0.00–1.49)
Prothrombin Time: 22.4 seconds — ABNORMAL HIGH (ref 11.6–15.2)

## 2015-01-11 LAB — CBC WITH DIFFERENTIAL/PLATELET
BASOS ABS: 0 10*3/uL (ref 0.0–0.1)
Basophils Relative: 0 % (ref 0–1)
Eosinophils Absolute: 0.1 10*3/uL (ref 0.0–0.7)
Eosinophils Relative: 1 % (ref 0–5)
HEMATOCRIT: 27 % — AB (ref 36.0–46.0)
HEMOGLOBIN: 8.8 g/dL — AB (ref 12.0–15.0)
LYMPHS PCT: 28 % (ref 12–46)
Lymphs Abs: 2.2 10*3/uL (ref 0.7–4.0)
MCH: 27.1 pg (ref 26.0–34.0)
MCHC: 32.6 g/dL (ref 30.0–36.0)
MCV: 83.1 fL (ref 78.0–100.0)
Monocytes Absolute: 2.1 10*3/uL — ABNORMAL HIGH (ref 0.1–1.0)
Monocytes Relative: 27 % — ABNORMAL HIGH (ref 3–12)
NEUTROS ABS: 3.4 10*3/uL (ref 1.7–7.7)
Neutrophils Relative %: 44 % (ref 43–77)
Platelets: 72 10*3/uL — ABNORMAL LOW (ref 150–400)
RBC: 3.25 MIL/uL — AB (ref 3.87–5.11)
RDW: 20.2 % — ABNORMAL HIGH (ref 11.5–15.5)
WBC: 7.8 10*3/uL (ref 4.0–10.5)

## 2015-01-11 LAB — URINE MICROSCOPIC-ADD ON

## 2015-01-11 LAB — I-STAT CG4 LACTIC ACID, ED: LACTIC ACID, VENOUS: 2.09 mmol/L — AB (ref 0.5–2.0)

## 2015-01-11 MED ORDER — SODIUM CHLORIDE 0.9 % IV SOLN
Freq: Once | INTRAVENOUS | Status: AC
  Administered 2015-01-11: 15:00:00 via INTRAVENOUS

## 2015-01-11 MED ORDER — HYDROCORTISONE 2.5 % RE CREA
1.0000 "application " | TOPICAL_CREAM | Freq: Two times a day (BID) | RECTAL | Status: DC
  Filled 2015-01-11: qty 28.35

## 2015-01-11 MED ORDER — DEXTROSE 5 % IV SOLN: 1.0000 g | INTRAVENOUS | Status: DC

## 2015-01-11 MED ORDER — WARFARIN 0.5 MG HALF TABLET
0.5000 mg | ORAL_TABLET | Freq: Once | ORAL | Status: DC
  Filled 2015-01-11: qty 1

## 2015-01-11 MED ORDER — ALLOPURINOL 300 MG PO TABS
300.0000 mg | ORAL_TABLET | Freq: Every day | ORAL | Status: DC
  Filled 2015-01-11: qty 1

## 2015-01-11 MED ORDER — INSULIN ASPART 100 UNIT/ML ~~LOC~~ SOLN: 0.0000 [IU] | Freq: Every day | SUBCUTANEOUS | Status: DC

## 2015-01-11 MED ORDER — ONDANSETRON HCL 4 MG/2ML IJ SOLN
4.0000 mg | Freq: Four times a day (QID) | INTRAMUSCULAR | Status: DC | PRN
  Administered 2015-01-12: 4 mg via INTRAVENOUS
  Filled 2015-01-11: qty 2

## 2015-01-11 MED ORDER — WARFARIN - PHARMACIST DOSING INPATIENT: Status: DC

## 2015-01-11 MED ORDER — INSULIN ASPART 100 UNIT/ML ~~LOC~~ SOLN
0.0000 [IU] | Freq: Three times a day (TID) | SUBCUTANEOUS | Status: DC
  Administered 2015-01-13: 3 [IU] via SUBCUTANEOUS

## 2015-01-11 MED ORDER — MONTELUKAST SODIUM 10 MG PO TABS
10.0000 mg | ORAL_TABLET | Freq: Every evening | ORAL | Status: DC
  Administered 2015-01-13 – 2015-01-14 (×2): 10 mg via ORAL
  Filled 2015-01-11 (×4): qty 1

## 2015-01-11 MED ORDER — WARFARIN SODIUM 5 MG PO TABS: 2.5000 mg | ORAL_TABLET | Freq: Once | ORAL | Status: DC

## 2015-01-11 MED ORDER — POLYETHYLENE GLYCOL 3350 17 G PO PACK
17.0000 g | PACK | Freq: Every day | ORAL | Status: DC
  Filled 2015-01-11: qty 1

## 2015-01-11 MED ORDER — SODIUM CHLORIDE 0.9 % IV SOLN
INTRAVENOUS | Status: DC
  Administered 2015-01-12: 22:00:00 via INTRAVENOUS

## 2015-01-11 MED ORDER — LORATADINE 10 MG PO TABS
10.0000 mg | ORAL_TABLET | Freq: Every day | ORAL | Status: DC | PRN
  Filled 2015-01-11: qty 1

## 2015-01-11 MED ORDER — ONDANSETRON HCL 4 MG PO TABS: 4.0000 mg | ORAL_TABLET | Freq: Four times a day (QID) | ORAL | Status: DC | PRN

## 2015-01-11 MED ORDER — METOPROLOL TARTRATE 12.5 MG HALF TABLET
12.5000 mg | ORAL_TABLET | Freq: Two times a day (BID) | ORAL | Status: DC
  Administered 2015-01-13 – 2015-01-14 (×4): 12.5 mg via ORAL
  Filled 2015-01-11 (×5): qty 1

## 2015-01-11 MED ORDER — ATORVASTATIN CALCIUM 40 MG PO TABS: 40.0000 mg | ORAL_TABLET | Freq: Every day | ORAL | Status: DC

## 2015-01-11 NOTE — Telephone Encounter (Signed)
Appointment given for tomorrow with Dr.Miller. I also called [facility] and arranged for them to bring her tomorrow.

## 2015-01-11 NOTE — ED Provider Notes (Signed)
Assumed patient care in sign out. 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 She was hospitalized at [facility] and then went to [facility]. From this initial hospitalization she went to a nursing home in [facility], then eventually home.  She had been getting outpatient rehab until she was admitted to [facility] for inpatient rehab about a month ago because she was having increasing difficulty with walking.   Admitted to hospital 01/01/15 - 01/09/15 at [facility] with dehydration/encepahlopathy. Discharged to home where she has caretakers available 24 hrs/day. Now again with decreased mental status of unclear etiology. Afebrile. HD stable. Apparently patient told daughter recently that she was tired of living. No specific concern for intentional self harm though. CT head w/o acute abnormality. Would not fit into out MRI machine.  Bun/Cr up again consistent with dehydration. Other lab abnormalities, but these appear close to baseline. Bacteria on UA but many squamous cells. Culture sent. Abx deferred at this time. Started IVF. Family unhappy with prior hospitalization at [facility]. Long discussion with previous ED provider. Family insistent on transfer to [facility]. Does not necessitate higher level of care but will discuss with transfer center at [facility] per family request.    Discussed with Dr Hall Busing, hospitalist at [facility] condition doesn't require higher level of care. Advised that currently they are currently "on a case by case basis" because of bed availability and will not accept transfer at this time. Family updated.   Virgel Manifold, MD 01/11/15 [phone]

## 2015-01-11 NOTE — ED Notes (Signed)
Pt's daughter at desk stating they want their mom transferred to [facility]. States they told EMS not to bring pt here to begin with. EDP aware and will speak with family.

## 2015-01-11 NOTE — ED Provider Notes (Signed)
CSN: [id number]     Arrival date & time 01/11/15  1249 History   First MD Initiated Contact with Patient 01/11/15 1248     Chief Complaint  Patient presents with  . Loss of Consciousness     (Consider location/radiation/quality/duration/timing/severity/associated sxs/prior Treatment) HPI   Brittany Clarke is a 74 y.o. female who is here for evaluation of unresponsive state. Patient lives in her home, with caregivers, who were with her 24/7. This morning the day care giver arrived, and found that the patient was unresponsive. She aroused a little bit, with verbal stimulation followed by a cool cloth on her face, but quickly "fell back asleep". The time of onset of the altered mental status, is unknown. The patient told her daughter yesterday that she was tired of living, and the daughter thinks that she has "given up". The patient is unable to give any history.  Level V Caveat- altered mental status    Past Medical History  Diagnosis Date  . CHF (congestive heart failure)   . Diabetes mellitus without complication   . COPD (chronic obstructive pulmonary disease)   . Hypertension   . Asthma   . Shortness of breath   . Stroke     2013  . Anxiety   . Pneumonia   . Gout   . 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 Anticoagulant long-term use    Past Surgical History  Procedure Laterality Date  . Tubal ligation    . Colonoscopy  04/04/2002    ASN:[id number] hemorrhoids, otherwise, normal rectum/Scattered left sided diverticula/Somewhat adenomatous appearing ileocecal valve which is probably normal but this area was biopsied. The remainder of the colonic mucosa appeared   normal  . Trigger finger surgery      twice  . Colonoscopy with esophagogastroduodenoscopy (egd) N/A 02/20/2013    Procedure: COLONOSCOPY WITH ESOPHAGOGASTRODUODENOSCOPY (EGD);  Surgeon: Daneil Dolin, MD;  Location: [facility];  Service: Endoscopy;  Laterality: N/A;  . Breast surgery Left  2005    lumpectomy and removed lymph nodes   Family History  Problem Relation Age of Onset  . Colon cancer Neg Hx   . Liver disease Neg Hx   . Heart disease Mother   . Hypertension Mother   . Cancer Father     lung  . Cancer Brother     prostate  . Diabetes Brother   . Diabetes Sister   . Cancer Sister     breast cancer  . Hypertension Sister   . Diabetes Sister   . Hypertension Sister   . Diabetes Sister   . Hypertension Sister   . Hypertension Sister   . Cancer Brother   . Diabetes Brother   . Diabetes Brother    Social History  Substance Use Topics  . Smoking status: Never Smoker   . Smokeless tobacco: Never Used  . Alcohol Use: No   OB History    No data available     Review of Systems  Unable to perform ROS     Allergies  Ultram  Home Medications   Prior to Admission medications   Medication Sig Start Date End Date Taking? Authorizing Provider  allopurinol (ZYLOPRIM) 300 MG tablet TAKE ONE (1) TABLET EACH DAY 10/08/14   [name], MD  Amino Acids-Protein Hydrolys (FEEDING SUPPLEMENT, PRO-STAT SUGAR FREE 64,) LIQD Take 30 mLs by mouth 2 (two) times daily.    Historical Provider, MD  atorvastatin (LIPITOR) 40  MG tablet TAKE ONE (1) TABLET EACH DAY 11/30/14   [name], MD  feeding supplement (GLUCERNA SHAKE) LIQD Take 237 mLs by mouth 3 (three) times daily between meals. Patient taking differently: Take 120 mLs by mouth See admin instructions. Given with medications per patient request per [facility] 06/12/12   Melton Alar, PA-C  furosemide (LASIX) 20 MG tablet Take 1 tablet (20 mg total) by mouth every other day. For fluid buildup 01/09/15   Rexene Alberts, MD  gabapentin (NEURONTIN) 100 MG capsule Take 1 capsule (100 mg total) by mouth 2 (two) times daily. 01/09/15   Rexene Alberts, MD  HYDROcodone-acetaminophen ([place]) 7.5-325 MG per tablet Take one tablet by mouth twice daily as needed for pain. Do not exceed 3gm of APAP/24 form all sources. 01/09/15    Rexene Alberts, MD  hydrocortisone (ANUSOL-HC) 2.5 % rectal cream Place 1 application rectally 2 (two) times daily. 01/09/15   Rexene Alberts, MD  loratadine (CLARITIN) 10 MG tablet TAKE ONE (1) TABLET EACH DAY 07/06/14   [name], MD  losartan (COZAAR) 25 MG tablet TAKE ONE (1) TABLET EACH DAY 11/30/14   [name], MD  metFORMIN (GLUCOPHAGE) 500 MG tablet TAKE ONE TABLET TWICE A DAY WITH FOOD 11/30/14   [name], MD  metoprolol tartrate (LOPRESSOR) 25 MG tablet TAKE 1/2 TABLET TWICE DAILY 11/30/14   [name], MD  montelukast (SINGULAIR) 10 MG tablet TAKE 1 TABLET DAILY IN THE EVENING 11/30/14   [name], MD  pantoprazole (PROTONIX) 40 MG tablet Take 1 tablet (40 mg total) by mouth daily. 01/09/15   Rexene Alberts, MD  polyethylene glycol (MIRALAX / Floria Raveling) packet Take 17 g by mouth daily. 01/09/15   Rexene Alberts, MD  warfarin (COUMADIN) 4 MG tablet DO NOT RESTART THIS MEDICATION UNTIL YOUR DOCTOR CHECKS YOUR BLOOD TEST (INR). 01/09/15   Rexene Alberts, MD   BP 100/51 mmHg  Pulse 78  Temp(Src) 95.5 F (35.3 C) (Rectal)  Resp 16  SpO2 95% Physical Exam  Constitutional: She appears well-developed and well-nourished.  HENT:  Head: Normocephalic and atraumatic.  Right Ear: External ear normal.  Left Ear: External ear normal.  Eyes: Conjunctivae and EOM are normal. Pupils are equal, round, and reactive to light.  Neck: Normal range of motion and phonation normal. Neck supple.  Cardiovascular: Normal rate, regular rhythm and normal heart sounds.   Pulmonary/Chest: Effort normal and breath sounds normal. She exhibits no bony tenderness.  Abdominal: Soft. There is no tenderness.  Musculoskeletal: Normal range of motion.  Neurological: No sensory deficit.  She is alert only to painful stimuli, whereupon she briskly pushes and away from her chest. She also speaks following painful stimuli, but the words are unintelligible. She easily moves her left arm, but not her  right arm.  Skin: Skin is warm, dry and intact.  Psychiatric:  She is obtunded  Nursing note and vitals reviewed.   ED Course  Procedures (including critical care time) Medications  0.9 %  sodium chloride infusion ( Intravenous New Bag/Given 01/11/15 1454)    Patient Vitals for the past 24 hrs:  BP Temp Temp src Pulse Resp SpO2  01/11/15 1530 (!) 105/54 mmHg - - 73 16 100 %  01/11/15 1500 (!) 102/51 mmHg - - - 16 -  01/11/15 1445 99/69 mmHg - - 72 15 100 %  01/11/15 1444 - (!) 96.9 F (36.1 C) Rectal - - -  01/11/15 1405 (!) 100/51 mmHg - -  78 16 95 %  01/11/15 1400 99/56 mmHg - - (!) 39 16 (!) 80 %  01/11/15 1340 - (!) 95.5 F (35.3 C) Rectal - - -  01/11/15 1330 105/60 mmHg - - 78 17 96 %  01/11/15 1300 - - - 80 19 (!) 89 %  01/11/15 1250 92/66 mmHg 98.1 F (36.7 C) Oral 82 18 94 %    14:15- I discussed the situation with 2 daughters, who are in the room with the patient. One daughter saw the patient, over the weekend. The other had not seen her since last Thursday. The history noted above in the history of present illness, was gained at this time, through this discussion. I explained the process of evaluation, and they agreed with the current plan.   3:49 PM Reevaluation with update and discussion. After initial assessment and treatment, an updated evaluation reveals patient is unchanged clinically. 5 family members are now in the room. 2 of the children, daughters, are extremely angry, and apparently have been demanding that she be immediately transferred to [facility], to see her stroke doctor. They reiterated that to me. I informed them that there was no indication yet for transfer, to [facility]. At this point, they agreed to MRI testing, at which time we can contact [facility], with the results. I informed the family members that [facility] may or may not be willing to accept her in transfer. The daughters continued to complain about the delay in transfer, and began to  talk about the care that she received during the recent hospitalization, totaling 8 day stay. They described problems with nursing care as well as frustration with lack of ultimate diagnosis. They also now state that the patient was really no better after discharge, and they think that something was missed on the evaluation during the hospitalization. It is clear that the sisters who are complaining about the care, do not have a clear understanding of the mechanics of evaluation and hospitalization. [place] Review Labs Reviewed  CBC WITH DIFFERENTIAL/PLATELET - Abnormal; Notable for the following:    RBC 3.25 (*)    Hemoglobin 8.8 (*)    HCT 27.0 (*)    RDW 20.2 (*)    Platelets 72 (*)    Monocytes Relative 27 (*)    Monocytes Absolute 2.1 (*)    All other components within normal limits  I-STAT CG4 LACTIC ACID, ED - Abnormal; Notable for the following:    Lactic Acid, Venous 2.09 (*)    All other components within normal limits  URINE CULTURE  COMPREHENSIVE METABOLIC PANEL  URINALYSIS, ROUTINE W REFLEX MICROSCOPIC (NOT AT [facility])    Imaging Review Dg Chest Port 1 View  01/11/2015   CLINICAL DATA:  Altered mental status. Patient is unable to give information.  EXAM: PORTABLE CHEST - 1 VIEW  COMPARISON:  01/06/2015  FINDINGS: Postsurgical changes are seen within the right chest wall, query prior right breast/axilla surgery.  Cardiomediastinal silhouette is normal for portable technique. Mediastinal contours appear widened, which may be due to portable technique and low lung volumes.  There is no evidence of focal airspace consolidation, pleural effusion or pneumothorax. The lung volumes are low.  Osseous structures are without acute abnormality. Soft tissues are grossly normal.  IMPRESSION: Low lung volumes.  Widened mediastinal contours, which may be partially artifactual. Attention on follow-up is recommended.   Electronically Signed   By: Fidela Salisbury M.D.   On:  01/11/2015 13:26  I have personally reviewed and evaluated these images and lab results as part of my medical decision-making.   EKG Interpretation   Date/Time:  Monday January 11 2015 12:53:58 EDT Ventricular Rate:  82 PR Interval:  150 QRS Duration: 108 QT Interval:  374 QTC Calculation: 437 R Axis:   -33 Text Interpretation:  Sinus rhythm Ventricular bigeminy Abnormal R-wave  progression, late transition Left ventricular hypertrophy ST depr,  consider ischemia, inferior leads Since last tracing inserior ST  depression is new Confirmed by Eulis Foster  MD, Brown Dunlap ([id number]) on 01/11/2015  1:59:36 PM      MDM   Final diagnoses:  Altered mental status, unspecified altered mental status type    Altered mental status, differential includes occult CVA, encephalopathy of unknown cause, and depression. Doubt sepsis, serious bacterial infection, toxic/poisoning encephalopathy. Comprehensive evaluation is been undertaken in the emergency department setting. Patient had recent hospitalization with extensive evaluation.  Nursing Notes Reviewed/ Care Coordinated Applicable Imaging Reviewed Interpretation of Laboratory Data incorporated into ED treatment   Plan: as per oncoming provider team after MRI Brain returns.    Daleen Bo, MD 01/13/15 [phone]

## 2015-01-11 NOTE — ED Notes (Signed)
Family updated on patient status with MD and nursing director at the bedside.

## 2015-01-11 NOTE — ED Notes (Signed)
Pt taken to MRI but MRI was unable to be completed because of pt size.  MD made aware.

## 2015-01-11 NOTE — ED Notes (Signed)
Lab at bedside

## 2015-01-11 NOTE — ED Notes (Signed)
Pt was brought in from home by EMS for unconscious. Pt is arousble to pain and stimuli.  Pt was recently treated for UTI at this hospital. MD at bedside

## 2015-01-11 NOTE — ED Notes (Signed)
Family raised concern over care of the patient. AC called in to talk with the family.

## 2015-01-11 NOTE — ED Notes (Signed)
Pt in and out cathed to obtain urine specimen.

## 2015-01-11 NOTE — H&P (Addendum)
[facility] History and Physical  Brittany Clarke FAO:[id number] DOB: 05-Jul-1940 DOA: 01/11/2015  Referring physician: ER PCP: [name], MD   Chief Complaint: Altered mental status  HPI: Brittany Clarke is a 74 y.o. female  This is a 74 year old lady who I admitted to the hospital 10 days ago with altered mental status. I felt that she was dehydrated at that time. She spent approximately 8 days in the hospital and was discharged 2 days ago to the skilled nursing facility. Prior to discharge, she was alert, smiling and appeared to be back to her usual self. Now she comes with decreased responsiveness. She is unable to give me a history and there are no family members at the bedside. Evaluation in the emergency room shows her to have worsening renal function to some degree and relative hypotension. She is now being admitted for further management.   Review of Systems:  Unable to obtain review of systems because of alteration in mental status.  Past Medical History  Diagnosis Date  . CHF (congestive heart failure)   . Diabetes mellitus without complication   . COPD (chronic obstructive pulmonary disease)   . Hypertension   . Asthma   . Shortness of breath   . Stroke     2013  . Anxiety   . Pneumonia   . Gout   . 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 Anticoagulant long-term use    Past Surgical History  Procedure Laterality Date  . Tubal ligation    . Colonoscopy  04/04/2002    ONG:EXBMWUXL hemorrhoids, otherwise, normal rectum/Scattered left sided diverticula/Somewhat adenomatous appearing ileocecal valve which is probably normal but this area was biopsied. The remainder of the colonic mucosa appeared   normal  . Trigger finger surgery      twice  . Colonoscopy with esophagogastroduodenoscopy (egd) N/A 02/20/2013    Procedure: COLONOSCOPY WITH ESOPHAGOGASTRODUODENOSCOPY (EGD);  Surgeon: Daneil Dolin, MD;  Location: [facility];  Service:  Endoscopy;  Laterality: N/A;  . Breast surgery Left 2005    lumpectomy and removed lymph nodes   Social History:  reports that she has never smoked. She has never used smokeless tobacco. She reports that she does not drink alcohol or use illicit drugs.  Allergies  Allergen Reactions  . Ultram [Tramadol] Shortness Of Breath    Family History  Problem Relation Age of Onset  . Colon cancer Neg Hx   . Liver disease Neg Hx   . Heart disease Mother   . Hypertension Mother   . Cancer Father     lung  . Cancer Brother     prostate  . Diabetes Brother   . Diabetes Sister   . Cancer Sister     breast cancer  . Hypertension Sister   . Diabetes Sister   . Hypertension Sister   . Diabetes Sister   . Hypertension Sister   . Hypertension Sister   . Cancer Brother   . Diabetes Brother   . Diabetes Brother     Prior to Admission medications   Medication Sig Start Date End Date Taking? Authorizing Provider  allopurinol (ZYLOPRIM) 300 MG tablet TAKE ONE (1) TABLET EACH DAY 10/08/14  Yes [name], MD  atorvastatin (LIPITOR) 40 MG tablet TAKE ONE (1) TABLET EACH DAY 11/30/14  Yes [name], MD  gabapentin (NEURONTIN) 100 MG capsule Take 1 capsule (100 mg total) by mouth 2 (two) times daily. 01/09/15  Yes Rexene Alberts, MD  HYDROcodone-acetaminophen ([place]) 7.5-325 MG per tablet Take one tablet by mouth twice daily as needed for pain. Do not exceed 3gm of APAP/24 form all sources. 01/09/15  Yes Rexene Alberts, MD  hydrocortisone (ANUSOL-HC) 2.5 % rectal cream Place 1 application rectally 2 (two) times daily. 01/09/15  Yes Rexene Alberts, MD  loratadine (CLARITIN) 10 MG tablet TAKE ONE (1) TABLET EACH DAY 07/06/14  Yes [name], MD  losartan (COZAAR) 25 MG tablet TAKE ONE (1) TABLET EACH DAY 11/30/14  Yes [name], MD  metFORMIN (GLUCOPHAGE) 500 MG tablet TAKE ONE TABLET TWICE A DAY WITH FOOD 11/30/14  Yes [name], MD  metoprolol tartrate (LOPRESSOR) 25 MG tablet  TAKE 1/2 TABLET TWICE DAILY 11/30/14  Yes [name], MD  montelukast (SINGULAIR) 10 MG tablet TAKE 1 TABLET DAILY IN THE EVENING 11/30/14  Yes [name], MD  polyethylene glycol (MIRALAX / GLYCOLAX) packet Take 17 g by mouth daily. 01/09/15  Yes Rexene Alberts, MD  furosemide (LASIX) 20 MG tablet Take 1 tablet (20 mg total) by mouth every other day. For fluid buildup Patient not taking: Reported on 01/11/2015 01/09/15   Rexene Alberts, MD  pantoprazole (PROTONIX) 40 MG tablet Take 1 tablet (40 mg total) by mouth daily. Patient not taking: Reported on 01/11/2015 01/09/15   Rexene Alberts, MD  warfarin (COUMADIN) 4 MG tablet DO NOT RESTART THIS MEDICATION UNTIL YOUR DOCTOR CHECKS YOUR BLOOD TEST (INR). 01/09/15   Rexene Alberts, MD   Physical Exam: Filed Vitals:   01/11/15 1730 01/11/15 1750 01/11/15 1800 01/11/15 1830  BP: 101/52  102/52 102/55  Pulse: 74  75 80  Temp:  97.7 F (36.5 C)    TempSrc:  Rectal    Resp: 16  18 13   SpO2: 100%  100% 100%    Wt Readings from Last 3 Encounters:  01/02/15 101.9 kg (224 lb 10.4 oz)  12/02/14 102.059 kg (225 lb)  11/12/14 102.059 kg (225 lb)    General:  Appears somewhat clinically dehydrated. She is obese. She is unresponsive but does respond to sternal rub by grimacing. She does not open her eyes. There does not appear to be any focal neurological signs. Eyes: PERRL, normal lids, irises & conjunctiva ENT: grossly normal hearing, lips & tongue Neck: no LAD, masses or thyromegaly Cardiovascular: RRR, no m/r/g. No LE edema. Telemetry: SR, no arrhythmias  Respiratory: CTA bilaterally, no w/r/r. Normal respiratory effort. Abdomen: soft, ntnd Skin: no rash or induration seen on limited exam Musculoskeletal: grossly normal tone BUE/BLE Psychiatric: Not examined. Neurologic: grossly non-focal.          Labs on Admission:  Basic Metabolic Panel:  Recent Labs Lab 01/05/15 0539 01/06/15 0517 01/07/15 0533 01/08/15 0536 01/11/15 1310    NA 135 135 136 136 136  K 3.5 3.1* 4.0 3.8 3.9  CL 103 102 103 102 101  CO2 24 25 25 26 24   GLUCOSE 118* 110* 108* 80 96  BUN 15 15 18 19  38*  CREATININE 0.65 0.79 0.86 0.81 1.57*  CALCIUM 9.3 9.4 9.5 9.8 11.2*  MG  --  1.0* 1.5* 1.5*  --    Liver Function Tests:  Recent Labs Lab 01/07/15 0533 01/11/15 1310  AST 39 47*  ALT 12* 16  ALKPHOS 59 65  BILITOT 1.9* 2.0*  PROT 5.8* 6.2*  ALBUMIN 2.8* 3.1*   No results for input(s): LIPASE, AMYLASE in the last 168 hours. No results for input(s): AMMONIA  in the last 168 hours. CBC:  Recent Labs Lab 01/06/15 0517 01/07/15 0533 01/08/15 0536 01/09/15 0557 01/11/15 1310  WBC 6.9 7.2 6.6 6.8 7.8  NEUTROABS  --   --   --   --  3.4  HGB 8.5* 8.4* 8.2* 8.6* 8.8*  HCT 25.8* 25.4* 25.3* 25.8* 27.0*  MCV 82.7 82.2 82.4 82.4 83.1  PLT 73* 78* 74* 76* 72*   Cardiac Enzymes: No results for input(s): CKTOTAL, CKMB, CKMBINDEX, TROPONINI in the last 168 hours.  BNP (last 3 results) No results for input(s): BNP in the last 8760 hours.  ProBNP (last 3 results) No results for input(s): PROBNP in the last 8760 hours.  CBG:  Recent Labs Lab 01/07/15 2142 01/09/15 0003  GLUCAP 95 87    Radiological Exams on Admission: Ct Head Wo Contrast  01/11/2015   CLINICAL DATA:  Altered mental status, unconsciousness, recent UTI.  EXAM: CT HEAD WITHOUT CONTRAST  TECHNIQUE: Contiguous axial images were obtained from the base of the skull through the vertex without intravenous contrast.  COMPARISON:  08/01/2014  FINDINGS: No mass effect or midline shift. No evidence of acute intracranial hemorrhage, or infarction. No abnormal extra-axial fluid collections. Basal cisterns are preserved. There are stable extensive changes of encephalomalacia in the left frontoparietal region, compatible with prior MCA distribution infarct. An area of hypoattenuation posterior to the area of encephalomalacia in the left parietal lobe is also stable, and may represent  a more subacute component. Periventricular small vessel white matter changes are also stable. There has been a probable remote infarct in the right basal ganglia.  No depressed skull fractures. Left maxillary sinus opacification is again noted. The remainder of the paranasal sinuses and mastoid air cells are normally aerated.  IMPRESSION: Extensive but stable changes in the left frontoparietal region, likely from chronic left MCA infarct. A more subacute appearing component posteriorly is also stable.  Probable right basal ganglia chronic infarct.  No evidence of acute intracranial hemorrhage or infarction.  Persistent opacification of the left maxillary sinus.   Electronically Signed   By: Fidela Salisbury M.D.   On: 01/11/2015 14:48   Dg Chest Port 1 View  01/11/2015   CLINICAL DATA:  Altered mental status. Patient is unable to give information.  EXAM: PORTABLE CHEST - 1 VIEW  COMPARISON:  01/06/2015  FINDINGS: Postsurgical changes are seen within the right chest wall, query prior right breast/axilla surgery.  Cardiomediastinal silhouette is normal for portable technique. Mediastinal contours appear widened, which may be due to portable technique and low lung volumes.  There is no evidence of focal airspace consolidation, pleural effusion or pneumothorax. The lung volumes are low.  Osseous structures are without acute abnormality. Soft tissues are grossly normal.  IMPRESSION: Low lung volumes.  Widened mediastinal contours, which may be partially artifactual. Attention on follow-up is recommended.   Electronically Signed   By: Fidela Salisbury M.D.   On: 01/11/2015 13:26      Assessment/Plan   1. Acute encephalopathy. I think this may be multifactorial. Certainly she is somewhat hypovolemic/dehydrated. She will be given IV fluids. I discussed the case with Dr. Caryn Section, hospitalist who had looked after on most recent admission, and we both wonder whether she is having seizures as she does have  fairly extensive cerebrovascular disease and her alteration in mental status is representative of this. Certainly, no obvious seizure was witnessed at the skilled nursing facility. I will order EEG, start IV fluids and ask neurology to see the patient  in consultation. 2. Diabetes. Sliding-scale insulin. 3. Hypertension. Hold ARB and furosemide for the time being. 4. Anemia of chronic disease. Stable. 5. Cerebrovascular disease. On chronic anticoagulation therapy with warfarin. Continue. Pharmacy will dose.  Further recommendations will depend on patient's hospital progress.   Code Status: Full code.   DVT Prophylaxis: Warfarin.  Family Communication: No family members at the bedside.   Disposition Plan: Depending on progress.   Time spent: 60 minutes.  Doree Albee [facility] Pager [phone].   Addendum: I spoke with the emergency room physician, Dr. Wilson Singer. He tells me that the family absolutely do not want to stay at this hospital and wished to be transferred to [facility]. I will facilitate this transfer based on family request.

## 2015-01-11 NOTE — ED Notes (Signed)
Report given to Franne Forts, RN

## 2015-01-11 NOTE — ED Notes (Signed)
MD at bedside. 

## 2015-01-11 NOTE — ED Notes (Signed)
Pt placed on 2 L via [place] because of intermittent drop in oxygen saturation.

## 2015-01-11 NOTE — ED Notes (Signed)
Pt pulse ox adjusted and oxygen increased to 95%.

## 2015-01-11 NOTE — Progress Notes (Signed)
[name] for Coumadin (chronic Rx PTA) Indication: stroke  Allergies  Allergen Reactions  . Ultram [Tramadol] Shortness Of Breath   Patient Measurements:    Vital Signs: Temp: 97.7 F (36.5 C) (08/22 1750) Temp Source: Rectal (08/22 1750) BP: 107/52 mmHg (08/22 1930) Pulse Rate: 83 (08/22 1930)  Labs:  Recent Labs  01/09/15 0557 01/11/15 1310 01/11/15 1720  HGB 8.6* 8.8*  --   HCT 25.8* 27.0*  --   PLT 76* 72*  --   LABPROT 40.3*  --  22.4*  INR 4.32*  --  1.98*  CREATININE  --  1.57*  --    Estimated Creatinine Clearance: 37.8 mL/min (by C-G formula based on Cr of 1.57).  Medical History: Past Medical History  Diagnosis Date  . CHF (congestive heart failure)   . Diabetes mellitus without complication   . COPD (chronic obstructive pulmonary disease)   . Hypertension   . Asthma   . Shortness of breath   . Stroke     2013  . Anxiety   . Pneumonia   . Gout   . 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 Anticoagulant long-term use    Medications:   (Not in a hospital admission) Assessment: 74yo female on chronic Coumadin PTA.  INR today 1.98.  She was instructed to hold coumadin as an outpatient as INR had increased to 7.32. Her CT is negative for bleed.  She will be admitted today for decreased responsiveness.  Goal of Therapy:  INR 2-3   Plan:  Coumadin 0.5 mg po today INR daily Monitor for bleeding complications  Brittany Clarke 01/11/2015,7:56 PM

## 2015-01-11 NOTE — ED Notes (Signed)
Family updated that pt has not been accepted to [facility] states the have no furthur questions/comments at this time.

## 2015-01-11 NOTE — ED Notes (Signed)
IV established. Unable to obtain blood specimen

## 2015-01-12 ENCOUNTER — Inpatient Hospital Stay (HOSPITAL_COMMUNITY)
Admit: 2015-01-12 | Discharge: 2015-01-12 | Disposition: A | Discharge status: Home / Self Care | Payer: Medicare Other | Attending: Internal Medicine | Admitting: Internal Medicine

## 2015-01-12 ENCOUNTER — Encounter (HOSPITAL_COMMUNITY): Payer: Self-pay | Admitting: General Practice

## 2015-01-12 ENCOUNTER — Ambulatory Visit: Payer: Medicare Other | Admitting: Family Medicine

## 2015-01-12 DIAGNOSIS — E119 Type 2 diabetes mellitus without complications: Secondary | ICD-10-CM

## 2015-01-12 DIAGNOSIS — G9340 Encephalopathy, unspecified: Secondary | ICD-10-CM

## 2015-01-12 LAB — BLOOD GAS, ARTERIAL
Acid-base deficit: 0.5 mmol/L (ref 0.0–2.0)
Bicarbonate: 23.6 mEq/L (ref 20.0–24.0)
Drawn by: 244801
O2 Content: 2 L/min
O2 Saturation: 99.1 %
PCO2 ART: 38.7 mmHg (ref 35.0–45.0)
PH ART: 7.402 (ref 7.350–7.450)
Patient temperature: 98.6
TCO2: 24.8 mmol/L (ref 0–100)
pO2, Arterial: 119 mmHg — ABNORMAL HIGH (ref 80.0–100.0)

## 2015-01-12 LAB — GLUCOSE, CAPILLARY
GLUCOSE-CAPILLARY: 102 mg/dL — AB (ref 65–99)
GLUCOSE-CAPILLARY: 103 mg/dL — AB (ref 65–99)
GLUCOSE-CAPILLARY: 105 mg/dL — AB (ref 65–99)
GLUCOSE-CAPILLARY: 100 mg/dL — AB (ref 65–99)

## 2015-01-12 LAB — PROTIME-INR
INR: 2.11 — ABNORMAL HIGH (ref 0.00–1.49)
PROTHROMBIN TIME: 23.5 s — AB (ref 11.6–15.2)

## 2015-01-12 LAB — CORTISOL: Cortisol, Plasma: 26.7 ug/dL

## 2015-01-12 LAB — CBG MONITORING, ED: GLUCOSE-CAPILLARY: 92 mg/dL (ref 65–99)

## 2015-01-12 LAB — AMMONIA: Ammonia: 31 umol/L (ref 9–35)

## 2015-01-12 MED ORDER — KETOROLAC TROMETHAMINE 15 MG/ML IJ SOLN
15.0000 mg | Freq: Once | INTRAMUSCULAR | Status: AC
  Administered 2015-01-12: 15 mg via INTRAVENOUS

## 2015-01-12 MED ORDER — KETOROLAC TROMETHAMINE 15 MG/ML IJ SOLN
INTRAMUSCULAR | Status: AC
Start: 2015-01-12 — End: 2015-01-13
  Filled 2015-01-12: qty 1

## 2015-01-12 MED ORDER — WARFARIN 0.5 MG HALF TABLET
0.5000 mg | ORAL_TABLET | Freq: Once | ORAL | Status: AC
  Filled 2015-01-12: qty 1

## 2015-01-12 NOTE — Care Management Note (Signed)
Case Management Note  Patient Details  Name: Brittany Clarke MRN: [id number] Date of Birth: Feb 19, 1941  Subjective/Objective:                    Action/Plan: Spoke with Miranda at [facility], who verified that patient was set up with them for HHPT/RN at discharge on 01/09/15. Patient was readmitted prior to the initial home health visit. CM will continue to follow for discharge needs.  Expected Discharge Date:  01/15/15               Expected Discharge Plan:   (Patient was recently discharged home with [facility]. Was readmitted prior to initial visit. Awaiting PT/OT evals for disposition.)  In-House Referral:  Clinical Social Work  Discharge planning Services  CM Consult  Post Acute Care Choice:    Choice offered to:     DME Arranged:    DME Agency:     HH Arranged:    HH Agency:     Status of Service:  In process, will continue to follow  Medicare Important Message Given:    Date Medicare IM Given:    Medicare IM give by:    Date Additional Medicare IM Given:    Additional Medicare Important Message give by:     If discussed at [place] of Stay Meetings, dates discussed:    Additional Comments:  Rolm Baptise, RN 01/12/2015, 4:32 PM

## 2015-01-12 NOTE — Progress Notes (Signed)
ANTICOAGULATION CONSULT NOTE - Follow Up Consult  Pharmacy Consult for Coumadin  Indication: hx of stroke  Allergies  Allergen Reactions  . Ultram [Tramadol] Shortness Of Breath    Patient Measurements: Height: 5\' 6"  (167.6 cm) Weight: 217 lb 3.2 oz (98.521 kg) IBW/kg (Calculated) : 59.3   Vital Signs: Temp: 98.1 F (36.7 C) (08/23 1042) Temp Source: Oral (08/23 1042) BP: 91/63 mmHg (08/23 1042) Pulse Rate: 108 (08/23 1042)  Labs:  Recent Labs  01/11/15 1310 01/11/15 1720 01/12/15 1140  HGB 8.8*  --   --   HCT 27.0*  --   --   PLT 72*  --   --   LABPROT  --  22.4* 23.5*  INR  --  1.98* 2.11*  CREATININE 1.57*  --   --     Estimated Creatinine Clearance: 37.8 mL/min (by C-G formula based on Cr of 1.57).   Assessment: 73 YOF readmitted with decreased responsiveness transfer from [facility] to [facility] for further evaluation.  She is on coumadin PTA for hx of stroke. Noted INR up to 7.3 after one dose of coumadin 4mg  on 8/12, has been holding since then, INR 1.98 on 8/22 PM at [facility], ordered 0.5 mg, but not given. Will dose cautiously. INR 2.11 today.  Goal of Therapy:  INR 2-3 Monitor platelets by anticoagulation protocol: Yes   Plan:  Coumadin 0.5 mg po x 1 F/u Daily PT/INR  [name], PharmD, BCPS  Clinical Pharmacist  Pager: [phone]   01/12/2015,12:35 PM

## 2015-01-12 NOTE — Progress Notes (Signed)
EEG Completed; Results Pending  

## 2015-01-12 NOTE — Progress Notes (Signed)
RT Note: RT came to obtain an ABG from patient per MD order. Family members are at bedside rolling patient around in the bed and bathing and dressing her. RT is unable to get to the patient to obtain lab work and she is very agitated and worked up currently. RT spoke to the patients RN Remo Lipps and notified him of the issue. He states that the family has been at bedside getting the patient worked up all day and states that he has spoken to them about it. Rt explained to the POA the need for the ABG and that we will come back once they are done. Remo Lipps is at bedside and RT will come back to attempt the ABG again. Rt will continue to monitor.

## 2015-01-12 NOTE — Consult Note (Signed)
NEURO HOSPITALIST CONSULT NOTE   Referring physician: Conley Canal   Reason for Consult: Encephalopathy  HPI:                                                                                                                                          Brittany Clarke is an 74 y.o. female who was seen at [place] on 01-01-15 for "alteration in her mental status over the last 3-4 days. She has had decreased alertness and decreased by mouth intake. She is normally conversant and able to walk with a walker. She is has been in the skilled nursing facility for the last 20 days or so receiving rehabilitation from a stroke in 2013. " CT head at that time was negative, per chart family was afraid she may be taking too many opiates. She has chronic anemia and is on coumadin for previous CVA. On 8-16 per note she was back to her baseline. She was discharged on 01-09-2015. Patient was brought back to [facility] on 8/22 due to being only arousal to noxious stimuli. Family demanded she be transferred to [facility] but due to low acuity she was not accepted at [facility] and transferred to [facility]. MRI was attempted at [facility] but unable to obtain due to her size.    Previous procedures obtained. Echo 01/06/2015--EF 65-70% no PFO LE venous doppler-- NO DVT UA 8-12   Negative and UC negative BC 8-15 No Growth Magnesium 8-17 was 1.0 Influenza panel, group A strep negative 8-18 labs included B12, TSH WNL  On this visit  CMP  Unrevealing  UA shows trace leukocytes--UC pending MRI brain --No acute stroke, remote L MA stroke from 2013  Past Medical History  Diagnosis Date  . CHF (congestive heart failure)   . Diabetes mellitus without complication   . COPD (chronic obstructive pulmonary disease)   . Hypertension   . Asthma   . Shortness of breath   . Stroke     2013  . Anxiety   . Pneumonia   . Gout   . 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 Anticoagulant long-term use      Past Surgical History  Procedure Laterality Date  . Tubal ligation    . Colonoscopy  04/04/2002    DXI:PJASNKNL hemorrhoids, otherwise, normal rectum/Scattered left sided diverticula/Somewhat adenomatous appearing ileocecal valve which is probably normal but this area was biopsied. The remainder of the colonic mucosa appeared   normal  . Trigger finger surgery      twice  . Colonoscopy with esophagogastroduodenoscopy (egd) N/A 02/20/2013    Procedure: COLONOSCOPY WITH ESOPHAGOGASTRODUODENOSCOPY (EGD);  Surgeon: Daneil Dolin, MD;  Location: [facility] ENDO [facility];  Service: Endoscopy;  Laterality: N/A;  . Breast surgery Left  2005    lumpectomy and removed lymph nodes    Family History  Problem Relation Age of Onset  . Colon cancer Neg Hx   . Liver disease Neg Hx   . Heart disease Mother   . Hypertension Mother   . Cancer Father     lung  . Cancer Brother     prostate  . Diabetes Brother   . Diabetes Sister   . Cancer Sister     breast cancer  . Hypertension Sister   . Diabetes Sister   . Hypertension Sister   . Diabetes Sister   . Hypertension Sister   . Hypertension Sister   . Cancer Brother   . Diabetes Brother   . Diabetes Brother     Social History:  reports that she has never smoked. She has never used smokeless tobacco. She reports that she does not drink alcohol or use illicit drugs.  Allergies  Allergen Reactions  . Ultram [Tramadol] Shortness Of Breath    MEDICATIONS:                                                                                                                     Prior to Admission:  Prescriptions prior to admission  Medication Sig Dispense Refill Last Dose  . allopurinol (ZYLOPRIM) 300 MG tablet TAKE ONE (1) TABLET EACH DAY 30 tablet 2 01/10/2015 at Unknown time  . atorvastatin (LIPITOR) 40 MG tablet TAKE ONE (1) TABLET EACH DAY 30 tablet 0 01/10/2015 at Unknown time  . gabapentin (NEURONTIN) 100 MG capsule Take 1 capsule (100 mg total) by  mouth 2 (two) times daily. 60 capsule 3 01/10/2015 at Unknown time  . HYDROcodone-acetaminophen (NORCO) 7.5-325 MG per tablet Take one tablet by mouth twice daily as needed for pain. Do not exceed 3gm of APAP/24 form all sources. 40 tablet 0 Past Week at Unknown time  . hydrocortisone (ANUSOL-HC) 2.5 % rectal cream Place 1 application rectally 2 (two) times daily. 30 g 3 01/10/2015 at Unknown time  . loratadine (CLARITIN) 10 MG tablet TAKE ONE (1) TABLET EACH DAY 30 tablet 10 01/10/2015 at Unknown time  . losartan (COZAAR) 25 MG tablet TAKE ONE (1) TABLET EACH DAY 30 tablet 2 01/10/2015 at Unknown time  . metFORMIN (GLUCOPHAGE) 500 MG tablet TAKE ONE TABLET TWICE A DAY WITH FOOD 60 tablet 0 01/10/2015 at Unknown time  . metoprolol tartrate (LOPRESSOR) 25 MG tablet TAKE 1/2 TABLET TWICE DAILY 30 tablet 2 01/10/2015 at 2100  . montelukast (SINGULAIR) 10 MG tablet TAKE 1 TABLET DAILY IN THE EVENING 30 tablet 1 01/10/2015 at Unknown time  . polyethylene glycol (MIRALAX / GLYCOLAX) packet Take 17 g by mouth daily.   Past Week at Unknown time  . furosemide (LASIX) 20 MG tablet Take 1 tablet (20 mg total) by mouth every other day. For fluid buildup (Patient not taking: Reported on 01/11/2015) 30 tablet 3 Completed Course at Unknown time  . pantoprazole (PROTONIX) 40 MG tablet Take 1 tablet (40  mg total) by mouth daily. (Patient not taking: Reported on 01/11/2015) 30 tablet 3   . warfarin (COUMADIN) 4 MG tablet DO NOT RESTART THIS MEDICATION UNTIL YOUR DOCTOR CHECKS YOUR BLOOD TEST (INR).   HOLD   Scheduled: . allopurinol  300 mg Oral Daily  . atorvastatin  40 mg Oral q1800  . hydrocortisone  1 application Rectal BID  . insulin aspart  0-20 Units Subcutaneous TID WC  . insulin aspart  0-5 Units Subcutaneous QHS  . metoprolol tartrate  12.5 mg Oral BID  . montelukast  10 mg Oral QPM  . polyethylene glycol  17 g Oral Daily  . warfarin  0.5 mg Oral Once  . Warfarin - Pharmacist Dosing Inpatient   Does not apply  Q24H     ROS:                                                                                                                                       History obtained from family  General ROS: negative for - chills, fatigue, fever, night sweats, weight gain or weight loss Psychological ROS: negative for - behavioral disorder, hallucinations, memory difficulties, mood swings or suicidal ideation Ophthalmic ROS: negative for - blurry vision, double vision, eye pain or loss of vision ENT ROS: negative for - epistaxis, nasal discharge, oral lesions, sore throat, tinnitus or vertigo Allergy and Immunology ROS: negative for - hives or itchy/watery eyes Hematological and Lymphatic ROS: negative for - bleeding problems, bruising or swollen lymph nodes Endocrine ROS: negative for - galactorrhea, hair pattern changes, polydipsia/polyuria or temperature intolerance Respiratory ROS: negative for - cough, hemoptysis, shortness of breath or wheezing Cardiovascular ROS: negative for - chest pain, dyspnea on exertion, edema or irregular heartbeat Gastrointestinal ROS: negative for - abdominal pain, diarrhea, hematemesis, nausea/vomiting or stool incontinence Genito-Urinary ROS: negative for - dysuria, hematuria, incontinence or urinary frequency/urgency Musculoskeletal ROS: negative for - joint swelling or muscular weakness Neurological ROS: as noted in HPI Dermatological ROS: negative for rash and skin lesion changes   Blood pressure 106/55, pulse 93, temperature 97.9 F (36.6 C), temperature source Oral, resp. rate 20, height 5\' 6"  (1.676 m), weight 98.521 kg (217 lb 3.2 oz), SpO2 100 %.   Neurologic Examination:                                                                                                      HEENT-  Normocephalic, no lesions, without obvious abnormality.  Normal external eye and conjunctiva.  Normal TM's bilaterally.  Normal auditory canals and external ears. Normal external nose,  mucus membranes and septum.  Normal pharynx. Cardiovascular- S1, S2 normal, pulses palpable throughout   Lungs- chest clear, no wheezing, rales, normal symmetric air entry Abdomen- normal findings: bowel sounds normal Extremities- no edema Lymph-no adenopathy palpable Musculoskeletal-no joint tenderness, deformity or swelling Skin-warm and dry, no hyperpigmentation, vitiligo, or suspicious lesions  Neurological Examination Mental Status: Drowsy, will yell to discomfort follows no commands, speaks nonsensical speech with intermittent profanity. . Cranial Nerves: II: Discs flat bilaterally; No blink to threatl, pupils equal, round, reactive to light and accommodation III,IV, VI: holding eyelids closed, Doll's reflex intact V,VII: facemetric, winces to pain bilaterally VIII: hearing normal bilaterally IX,X: unable to visualize XI: bilateral shoulder shrug XII: midline tongue extension Motor: Right : Upper extremity   0/5    Left:     Upper extremity   5/5  Lower extremity   15     Lower extremity   5/5 Tone and bulk:normal tone throughout; no atrophy noted Sensory: winces to pain bilaterally Deep Tendon Reflexes: 1+ and symmetric throughout UE no KJ right ankle has sustained clonus and no left AJ Plantars: Mute bilaterally Cerebellar: Unable to obtain Gait: unable to assess      Lab Results: Basic Metabolic Panel:  Recent Labs Lab 01/06/15 0517 01/07/15 0533 01/08/15 0536 01/11/15 1310  NA 135 136 136 136  K 3.1* 4.0 3.8 3.9  CL 102 103 102 101  CO2 25 25 26 24   GLUCOSE 110* 108* 80 96  BUN 15 18 19  38*  CREATININE 0.79 0.86 0.81 1.57*  CALCIUM 9.4 9.5 9.8 11.2*  MG 1.0* 1.5* 1.5*  --     Liver Function Tests:  Recent Labs Lab 01/07/15 0533 01/11/15 1310  AST 39 47*  ALT 12* 16  ALKPHOS 59 65  BILITOT 1.9* 2.0*  PROT 5.8* 6.2*  ALBUMIN 2.8* 3.1*   No results for input(s): LIPASE, AMYLASE in the last 168 hours. No results for input(s): AMMONIA in the  last 168 hours.  CBC:  Recent Labs Lab 01/06/15 0517 01/07/15 0533 01/08/15 0536 01/09/15 0557 01/11/15 1310  WBC 6.9 7.2 6.6 6.8 7.8  NEUTROABS  --   --   --   --  3.4  HGB 8.5* 8.4* 8.2* 8.6* 8.8*  HCT 25.8* 25.4* 25.3* 25.8* 27.0*  MCV 82.7 82.2 82.4 82.4 83.1  PLT 73* 78* 74* 76* 72*    Cardiac Enzymes: No results for input(s): CKTOTAL, CKMB, CKMBINDEX, TROPONINI in the last 168 hours.  Lipid Panel: No results for input(s): CHOL, TRIG, HDL, CHOLHDL, VLDL, LDLCALC in the last 168 hours.  CBG:  Recent Labs Lab 01/07/15 2142 01/09/15 0003 01/12/15 0240 01/12/15 0648  GLUCAP 95 87 92 102*    Microbiology: Results for orders placed or performed during the hospital encounter of 01/01/15  C difficile quick scan w PCR reflex     Status: None   Collection Time: 01/01/15  3:06 PM  Result Value Ref Range Status   C Diff antigen NEGATIVE NEGATIVE Final   C Diff toxin NEGATIVE NEGATIVE Final   C Diff interpretation Negative for toxigenic C. difficile  Final  MRSA PCR Screening     Status: None   Collection Time: 01/02/15  9:30 PM  Result Value Ref Range Status   MRSA by PCR NEGATIVE NEGATIVE Final    Comment:        The GeneXpert MRSA Assay (FDA approved for NASAL specimens  only), is one component of a comprehensive MRSA colonization surveillance program. It is not intended to diagnose MRSA infection nor to guide or monitor treatment for MRSA infections.   Culture, blood (x 2)     Status: None   Collection Time: 01/04/15  7:28 AM  Result Value Ref Range Status   Specimen Description BLOOD LEFT HAND  Final   Special Requests BOTTLES DRAWN AEROBIC AND ANAEROBIC 5CC  Final   Culture NO GROWTH 5 DAYS  Final   Report Status 01/09/2015 FINAL  Final  Culture, blood (x 2)     Status: None   Collection Time: 01/04/15  7:30 AM  Result Value Ref Range Status   Specimen Description BLOOD LEFT WRIST  Final   Special Requests BOTTLES DRAWN AEROBIC AND ANAEROBIC 5CC   Final   Culture NO GROWTH 5 DAYS  Final   Report Status 01/09/2015 FINAL  Final  Urine culture     Status: None   Collection Time: 01/04/15  3:15 PM  Result Value Ref Range Status   Specimen Description URINE, CATHETERIZED  Final   Special Requests NONE  Final   Culture   Final    NO GROWTH 2 DAYS Performed at [facility]    Report Status 01/06/2015 FINAL  Final  Rapid strep screen (not at [facility])     Status: None   Collection Time: 01/06/15  3:20 PM  Result Value Ref Range Status   Streptococcus, Group A Screen (Direct) NEGATIVE NEGATIVE Final    Comment: (NOTE) A Rapid Antigen test may result negative if the antigen level in the sample is below the detection level of this test. The FDA has not cleared this test as a stand-alone test therefore the rapid antigen negative result has reflexed to a Group A Strep culture.   Culture, Group A Strep     Status: None   Collection Time: 01/06/15  3:20 PM  Result Value Ref Range Status   Strep A Culture Negative  Final    Comment: (NOTE) Performed At: [facility] [place], [place] [id number] [name] MD UX:[id number]     Coagulation Studies:  Recent Labs  01/11/15 1720  LABPROT 22.4*  INR 1.98*    Imaging: Ct Head Wo Contrast  01/11/2015   CLINICAL DATA:  Altered mental status, unconsciousness, recent UTI.  EXAM: CT HEAD WITHOUT CONTRAST  TECHNIQUE: Contiguous axial images were obtained from the base of the skull through the vertex without intravenous contrast.  COMPARISON:  08/01/2014  FINDINGS: No mass effect or midline shift. No evidence of acute intracranial hemorrhage, or infarction. No abnormal extra-axial fluid collections. Basal cisterns are preserved. There are stable extensive changes of encephalomalacia in the left frontoparietal region, compatible with prior MCA distribution infarct. An area of hypoattenuation posterior to the area of encephalomalacia in the left parietal lobe is  also stable, and may represent a more subacute component. Periventricular small vessel white matter changes are also stable. There has been a probable remote infarct in the right basal ganglia.  No depressed skull fractures. Left maxillary sinus opacification is again noted. The remainder of the paranasal sinuses and mastoid air cells are normally aerated.  IMPRESSION: Extensive but stable changes in the left frontoparietal region, likely from chronic left MCA infarct. A more subacute appearing component posteriorly is also stable.  Probable right basal ganglia chronic infarct.  No evidence of acute intracranial hemorrhage or infarction.  Persistent opacification of the left maxillary sinus.  Electronically Signed   By: Fidela Salisbury M.D.   On: 01/11/2015 14:48   Mr Brain Wo Contrast  01/12/2015   CLINICAL DATA:  Initial evaluation for acute altered mental status.  EXAM: MRI HEAD WITHOUT CONTRAST  TECHNIQUE: Multiplanar, multiecho pulse sequences of the brain and surrounding structures were obtained without intravenous contrast.  COMPARISON:  Prior CT from earlier the same day as well as previous MRI from 02/08/2009 and CT from 11/12/2014.  FINDINGS: Study is degraded by motion artifact.  Diffuse prominence of the CSF containing spaces is compatible with generalized cerebral atrophy. Patchy and confluent T2/FLAIR hyperintensity within the periventricular and deep white matter of both cerebral hemispheres most consistent with chronic small vessel ischemic disease. Encephalomalacia within the posterior left frontal lobe and parasagittal left frontal parietal region seen, most consistent with remote ischemic infarct. Associated gliosis and chronic hemosiderin staining present.  No abnormal foci of restricted diffusion to suggest acute intracranial infarct. Gray-white matter differentiation maintained. Normal intravascular flow voids are maintained. No acute intracranial hemorrhage.  No mass lesion, midline  shift, or mass effect. No hydrocephalus. No extra-axial fluid collection.  Craniocervical junction within normal limits. Degenerative spondylolysis present within the upper cervical spine without significant stenosis.  Incidental note made of a partially empty sella. No acute abnormality about the orbits.  Chronic left maxillary sinus disease noted. Paranasal sinuses are otherwise clear. No mastoid effusion. Inner ear structures grossly normal.  Bone marrow signal intensity within normal limits. Scalp soft tissues unremarkable.  Incidental note made of enlarged bilateral cervical lymph nodes at level 2, measuring up to 17 mm on the left and 11 mm on the right, indeterminate. These may be similar relative to prior CT from 11/12/2014.  IMPRESSION: 1. No acute intracranial process identified. 2. Remote left MCA territory infarct involving the posterior left frontal and parietal lobes. 3. Generalized cerebral atrophy with mild to moderate chronic small vessel ischemic disease. 4. Enlarged bilateral level II cervical lymph nodes, incompletely evaluated on this exam, but may be similar relative to prior CT from 11/12/2014. These are of uncertain significance.   Electronically Signed   By: Jeannine Boga M.D.   On: 01/12/2015 03:12   Dg Chest Port 1 View  01/11/2015   CLINICAL DATA:  Altered mental status. Patient is unable to give information.  EXAM: PORTABLE CHEST - 1 VIEW  COMPARISON:  01/06/2015  FINDINGS: Postsurgical changes are seen within the right chest wall, query prior right breast/axilla surgery.  Cardiomediastinal silhouette is normal for portable technique. Mediastinal contours appear widened, which may be due to portable technique and low lung volumes.  There is no evidence of focal airspace consolidation, pleural effusion or pneumothorax. The lung volumes are low.  Osseous structures are without acute abnormality. Soft tissues are grossly normal.  IMPRESSION: Low lung volumes.  Widened mediastinal  contours, which may be partially artifactual. Attention on follow-up is recommended.   Electronically Signed   By: Fidela Salisbury M.D.   On: 01/11/2015 13:26       Assessment and plan per attending neurologist  Etta Quill PA-C [facility] [phone]  01/12/2015, 10:42 AM   Assessment/Plan: 74 year old female with altered mental status which appears to be most consistent with a toxic metabolic encephalopathy.   1) ammonia, cortisol 2) ABG 3) neurology will continue to follow   Roland Rack, MD [facility] [phone]  If 7pm- 7am, please page neurology on call as listed in [place].

## 2015-01-12 NOTE — Procedures (Signed)
ELECTROENCEPHALOGRAM REPORT  Patient: Brittany Clarke       Room #: 3F58 EEG No. ID: [id number] Age: 74 y.o.        Sex: female Referring Physician: Remus Blake Report Date:  01/12/2015        Interpreting Physician: Anthony Sar  History: Kendahl Bumgardner is an 74 y.o. female with a history of congestive heart failure, diabetes mellitus, COPD, hypertension and stroke, admitted for altered mental status with encephalopathy.  Indications for study:  Assess severity of encephalopathy; rule out partial seizure activity.  Technique: This is an 18 channel routine scalp EEG performed at the bedside with bipolar and monopolar montages arranged in accordance to the international 10/20 system of electrode placement.   Description: This EEG recording was performed during wakefulness and light sleep. Predominant background activity consisted of moderate amplitude diffuse mixed delta and theta activity with frequent triphasic moderate to slightly high amplitude triphasic sharp waves recorded as well. During light sleep though was continued mixed delta and theta activity of moderate amplitude with less pronounced triphasic wave activity. Photic stimulation was not performed. No epileptiform discharges were recorded.  Interpretation: This EEG study is abnormal with moderately severe generalized continuous nonspecific slowing of cerebral activity. Triphasic sharp waves are most often seen with moderate to severe metabolic encephalopathy, particularly with hepatic and renal failure, but can be seen with other metabolic conditions as well. No evidence of epileptic activity was recorded.   Rush Farmer M.D. [facility] [phone]

## 2015-01-12 NOTE — Evaluation (Signed)
Clinical/Bedside Swallow Evaluation Patient Details  Name: Brittany Clarke MRN: [id number] Date of Birth: 1940-10-28  Today's Date: 01/12/2015 Time: SLP Start Time (ACUTE ONLY): 1045 SLP Stop Time (ACUTE ONLY): 1130 SLP Time Calculation (min) (ACUTE ONLY): 45 min  Past Medical History:  Past Medical History  Diagnosis Date  . CHF (congestive heart failure)   . Diabetes mellitus without complication   . COPD (chronic obstructive pulmonary disease)   . Hypertension   . Asthma   . Shortness of breath   . Stroke     2013  . Anxiety   . Pneumonia   . Gout   . 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 Anticoagulant long-term use    Past Surgical History:  Past Surgical History  Procedure Laterality Date  . Tubal ligation    . Colonoscopy  04/04/2002    SHF:WYOVZCHY hemorrhoids, otherwise, normal rectum/Scattered left sided diverticula/Somewhat adenomatous appearing ileocecal valve which is probably normal but this area was biopsied. The remainder of the colonic mucosa appeared   normal  . Trigger finger surgery      twice  . Colonoscopy with esophagogastroduodenoscopy (egd) N/A 02/20/2013    Procedure: COLONOSCOPY WITH ESOPHAGOGASTRODUODENOSCOPY (EGD);  Surgeon: Daneil Dolin, MD;  Location: [facility];  Service: Endoscopy;  Laterality: N/A;  . Breast surgery Left 2005    lumpectomy and removed lymph nodes   HPI:  74 year old female readmitted 01/11/15 due to AMS, decreased renal function, hypotension. Pt was just recently admitted due to AMS. PMH significant for multiple co-morbidities, including CHF, DM, COPD, HTN, SOB, CVA (2013), PNA (2014), breast CA.    Assessment / Plan / Recommendation Clinical Impression  Oral care was attempted with suction, 2 family members had to assist SLP due to pt combativeness. SLP was able to clean only pt's lips thoroughly. Pt strongly resistant to allowing swab inside her mouth.  Oral cavity was noted to be quite dry. Family  reports pt has dentures, but does not wear them.  Pt was unable to follow directions, and speech was difficult to understand. Pt was presented with 1 ice chip  and a small amount of water, however, pt made no attempt to take ice or water. Recommend NPO at this time, until pt is more accepting of oral care and po trials. RN and family members aware.     Aspiration Risk  Severe    Diet Recommendation NPO   Medication Administration: Via alternative means    Other  Recommendations Oral Care Recommendations: Oral care QID;Staff/trained caregiver to provide oral care   Follow Up Recommendations       Frequency and Duration min 1 x/week  1 week   Pertinent Vitals/Pain Pt grimacing with stimulation, but unable to provide information re: pain    SLP Swallow Goals  Demonstrate po readiness   Swallow Study Prior Functional Status   No history of swallowing difficulty per family. Pt ate only eggs, potatoes, pasta, and chips each day.    General Date of Onset: 01/11/15 Other Pertinent Information: 74 year old female readmitted 01/11/15 due to AMS, decreased renal function, hypotension. Pt was just recently admitted due to AMS. PMH significant for multiple co-morbidities, including CHF, DM, COPD, HTN, SOB, CVA (2013), PNA (2014), breast CA.  Type of Study: Bedside swallow evaluation Previous Swallow Assessment: none found Diet Prior to this Study: NPO Temperature Spikes Noted: No Respiratory Status: Supplemental O2 delivered via (comment) ([place] @ 2L)  History of Recent Intubation: No Behavior/Cognition: Confused;Agitated;Doesn't follow directions;Uncooperative Oral Cavity - Dentition: Edentulous Self-Feeding Abilities: Refused PO Patient Positioning: Upright in bed Baseline Vocal Quality: Normal Volitional Cough: Cognitively unable to elicit Volitional Swallow: Unable to elicit    Oral/Motor/Sensory Function Overall Oral Motor/Sensory Function: Other (comment) (unable to assess due to  mental status)   Ice Chips Ice chips: Impaired Presentation: Spoon Oral Phase Impairments: Poor awareness of bolus   Thin Liquid Thin Liquid: Not tested    Nectar Thick Nectar Thick Liquid: Not tested   Honey Thick Honey Thick Liquid: Not tested   Puree Puree: Not tested   Solid   GO   Lawrence Roldan B. Quentin [facility], CCC-SLP [phone] Solid: Not tested       Shonna Chock 01/12/2015,11:44 AM

## 2015-01-12 NOTE — Progress Notes (Signed)
Initial Nutrition Assessment  DOCUMENTATION CODES:   Obesity unspecified, Non-severe (moderate) malnutrition in context of acute illness/injury  INTERVENTION:  Diet advancement per MD/SLP  If expected that pt will remain NPO >/=3 day, recommend initiating TF via NGT: Initiate Vital AF 1.2 @ 20 ml/hr via NGT and increase by 10 ml every 4 hours to goal rate of 55 ml/hr.   Tube feeding regimen provides 1584 kcal (99% of needs), 99 grams of protein, and 1069 ml of H2O.    NUTRITION DIAGNOSIS:   Malnutrition related to lethargy/confusion, poor appetite, inability to eat as evidenced by energy intake < 75% for > 7 days, mild depletion of muscle mass, percent weight loss.   GOAL:   Patient will meet greater than or equal to 90% of their needs    MONITOR:   Diet advancement, Labs, Weight trends, Skin, PO intake  REASON FOR ASSESSMENT:   Low Braden    ASSESSMENT:   74 year old female readmitted 01/11/15 due to AMS, decreased renal function, hypotension. Pt was just recently admitted due to AMS. PMH significant for multiple co-morbidities, including CHF, DM, COPD, HTN, SOB, CVA (2013), PNA (2014), breast CA.   Per pt's family at bedside patient was eating very little PTA while at [facility] and [facility]. Weight history shows pt has lost 8 lbs in the past 10 days- 4% weight loss is significant for time frame. Per physical exam, pt does show signs of mild/moderate muscle wasting. Per SLP assessment, pt refused PO's of ice chips.  Labs: elevated calcium, low GFR  Diet Order:  Diet clear liquid Room service appropriate?: Yes; Fluid consistency:: Thin  Skin:  Reviewed, no issues  Last BM:  8/23  Height:   Ht Readings from Last 1 Encounters:  01/12/15 5\' 6"  (1.676 m)    Weight:   Wt Readings from Last 1 Encounters:  01/12/15 217 lb 3.2 oz (98.521 kg)    Ideal Body Weight:  59.1 kg  BMI:  Body mass index is 35.07 kg/(m^2).  Estimated Nutritional Needs:    Kcal:  1600-1800  Protein:  100-110 grams  Fluid:  1.6-1.8 L/day  EDUCATION NEEDS:   No education needs identified at this time  [place], LDN Inpatient Clinical Dietitian Pager: [phone] After Hours Pager: [phone]

## 2015-01-12 NOTE — Progress Notes (Signed)
[facility] PROGRESS NOTE  Brittany Clarke WPY:[id number] DOB: 11-Aug-1940 DOA: 01/11/2015 PCP: [name], MD  Assessment/Plan:  Principal Problem:   Acute encephalopathy: check ABG. Check ammonia level.  Neurology consulted. Hold any potentially sedating medications. EEG with generalized slowing. Extensively worked up last admission. Neurology consulted. Give IVF. Worsened by dehydration. Speech therapy recommending NPO for now. Active Problems:   Anemia   HTN (hypertension)   DM II (diabetes mellitus, type II), controlled   Charcot foot due to diabetes mellitus   AKI (acute kidney injury) thrombocytopenia  Code Status:  full Family Communication:  Multiple at bedside Disposition Plan:  home  Consultants:  neurology  Procedures:     Antibiotics:    HPI/Subjective: Per, RN, confused, agitated.  Objective: Filed Vitals:   01/12/15 1042  BP: 91/63  Pulse: 108  Temp: 98.1 F (36.7 C)  Resp: 20    Intake/Output Summary (Last 24 hours) at 01/12/15 1636 Last data filed at 01/12/15 1200  Gross per 24 hour  Intake      0 ml  Output      0 ml  Net      0 ml   Filed Weights   01/12/15 0600  Weight: 98.521 kg (217 lb 3.2 oz)    Exam:   General:  Eyes closed. Agitated when examined  Cardiovascular: RRR without MGR  Respiratory: CTA without WRR  Abdomen: S nt, nd  Ext: no CCE  Basic Metabolic Panel:  Recent Labs Lab 01/06/15 0517 01/07/15 0533 01/08/15 0536 01/11/15 1310  NA 135 136 136 136  K 3.1* 4.0 3.8 3.9  CL 102 103 102 101  CO2 25 25 26 24   GLUCOSE 110* 108* 80 96  BUN 15 18 19  38*  CREATININE 0.79 0.86 0.81 1.57*  CALCIUM 9.4 9.5 9.8 11.2*  MG 1.0* 1.5* 1.5*  --    Liver Function Tests:  Recent Labs Lab 01/07/15 0533 01/11/15 1310  AST 39 47*  ALT 12* 16  ALKPHOS 59 65  BILITOT 1.9* 2.0*  PROT 5.8* 6.2*  ALBUMIN 2.8* 3.1*   No results for input(s): LIPASE, AMYLASE in the last 168 hours.  Recent Labs Lab  01/12/15 1145  AMMONIA 31   CBC:  Recent Labs Lab 01/06/15 0517 01/07/15 0533 01/08/15 0536 01/09/15 0557 01/11/15 1310  WBC 6.9 7.2 6.6 6.8 7.8  NEUTROABS  --   --   --   --  3.4  HGB 8.5* 8.4* 8.2* 8.6* 8.8*  HCT 25.8* 25.4* 25.3* 25.8* 27.0*  MCV 82.7 82.2 82.4 82.4 83.1  PLT 73* 78* 74* 76* 72*   Cardiac Enzymes: No results for input(s): CKTOTAL, CKMB, CKMBINDEX, TROPONINI in the last 168 hours. BNP (last 3 results) No results for input(s): BNP in the last 8760 hours.  ProBNP (last 3 results) No results for input(s): PROBNP in the last 8760 hours.  CBG:  Recent Labs Lab 01/09/15 0003 01/12/15 0240 01/12/15 0648 01/12/15 1134 01/12/15 1623  GLUCAP 87 92 102* 103* 105*    Recent Results (from the past 240 hour(s))  MRSA PCR Screening     Status: None   Collection Time: 01/02/15  9:30 PM  Result Value Ref Range Status   MRSA by PCR NEGATIVE NEGATIVE Final    Comment:        The GeneXpert MRSA Assay (FDA approved for NASAL specimens only), is one component of a comprehensive MRSA colonization surveillance program. It is not intended to diagnose MRSA infection nor to guide  or monitor treatment for MRSA infections.   Culture, blood (x 2)     Status: None   Collection Time: 01/04/15  7:28 AM  Result Value Ref Range Status   Specimen Description BLOOD LEFT HAND  Final   Special Requests BOTTLES DRAWN AEROBIC AND ANAEROBIC 5CC  Final   Culture NO GROWTH 5 DAYS  Final   Report Status 01/09/2015 FINAL  Final  Culture, blood (x 2)     Status: None   Collection Time: 01/04/15  7:30 AM  Result Value Ref Range Status   Specimen Description BLOOD LEFT WRIST  Final   Special Requests BOTTLES DRAWN AEROBIC AND ANAEROBIC 5CC  Final   Culture NO GROWTH 5 DAYS  Final   Report Status 01/09/2015 FINAL  Final  Urine culture     Status: None   Collection Time: 01/04/15  3:15 PM  Result Value Ref Range Status   Specimen Description URINE, CATHETERIZED  Final    Special Requests NONE  Final   Culture   Final    NO GROWTH 2 DAYS Performed at [facility]    Report Status 01/06/2015 FINAL  Final  Rapid strep screen (not at [facility])     Status: None   Collection Time: 01/06/15  3:20 PM  Result Value Ref Range Status   Streptococcus, Group A Screen (Direct) NEGATIVE NEGATIVE Final    Comment: (NOTE) A Rapid Antigen test may result negative if the antigen level in the sample is below the detection level of this test. The FDA has not cleared this test as a stand-alone test therefore the rapid antigen negative result has reflexed to a Group A Strep culture.   Culture, Group A Strep     Status: None   Collection Time: 01/06/15  3:20 PM  Result Value Ref Range Status   Strep A Culture Negative  Final    Comment: (NOTE) Performed At: [facility] [address] [place], [place] [id number] [name] MD PH:[phone]   Urine culture     Status: None (Preliminary result)   Collection Time: 01/11/15  1:41 PM  Result Value Ref Range Status   Specimen Description URINE, CATHETERIZED  Final   Special Requests NONE  Final   Culture   Final    NO GROWTH < 24 HOURS Performed at [facility]    Report Status PENDING  Incomplete     Studies: Ct Head Wo Contrast  01/11/2015   CLINICAL DATA:  Altered mental status, unconsciousness, recent UTI.  EXAM: CT HEAD WITHOUT CONTRAST  TECHNIQUE: Contiguous axial images were obtained from the base of the skull through the vertex without intravenous contrast.  COMPARISON:  08/01/2014  FINDINGS: No mass effect or midline shift. No evidence of acute intracranial hemorrhage, or infarction. No abnormal extra-axial fluid collections. Basal cisterns are preserved. There are stable extensive changes of encephalomalacia in the left frontoparietal region, compatible with prior MCA distribution infarct. An area of hypoattenuation posterior to the area of encephalomalacia in the left parietal lobe is also  stable, and may represent a more subacute component. Periventricular small vessel white matter changes are also stable. There has been a probable remote infarct in the right basal ganglia.  No depressed skull fractures. Left maxillary sinus opacification is again noted. The remainder of the paranasal sinuses and mastoid air cells are normally aerated.  IMPRESSION: Extensive but stable changes in the left frontoparietal region, likely from chronic left MCA infarct. A more subacute appearing component posteriorly  is also stable.  Probable right basal ganglia chronic infarct.  No evidence of acute intracranial hemorrhage or infarction.  Persistent opacification of the left maxillary sinus.   Electronically Signed   By: Fidela Salisbury M.D.   On: 01/11/2015 14:48   Mr Brain Wo Contrast  01/12/2015   CLINICAL DATA:  Initial evaluation for acute altered mental status.  EXAM: MRI HEAD WITHOUT CONTRAST  TECHNIQUE: Multiplanar, multiecho pulse sequences of the brain and surrounding structures were obtained without intravenous contrast.  COMPARISON:  Prior CT from earlier the same day as well as previous MRI from 02/08/2009 and CT from 11/12/2014.  FINDINGS: Study is degraded by motion artifact.  Diffuse prominence of the CSF containing spaces is compatible with generalized cerebral atrophy. Patchy and confluent T2/FLAIR hyperintensity within the periventricular and deep white matter of both cerebral hemispheres most consistent with chronic small vessel ischemic disease. Encephalomalacia within the posterior left frontal lobe and parasagittal left frontal parietal region seen, most consistent with remote ischemic infarct. Associated gliosis and chronic hemosiderin staining present.  No abnormal foci of restricted diffusion to suggest acute intracranial infarct. Gray-white matter differentiation maintained. Normal intravascular flow voids are maintained. No acute intracranial hemorrhage.  No mass lesion, midline shift,  or mass effect. No hydrocephalus. No extra-axial fluid collection.  Craniocervical junction within normal limits. Degenerative spondylolysis present within the upper cervical spine without significant stenosis.  Incidental note made of a partially empty sella. No acute abnormality about the orbits.  Chronic left maxillary sinus disease noted. Paranasal sinuses are otherwise clear. No mastoid effusion. Inner ear structures grossly normal.  Bone marrow signal intensity within normal limits. Scalp soft tissues unremarkable.  Incidental note made of enlarged bilateral cervical lymph nodes at level 2, measuring up to 17 mm on the left and 11 mm on the right, indeterminate. These may be similar relative to prior CT from 11/12/2014.  IMPRESSION: 1. No acute intracranial process identified. 2. Remote left MCA territory infarct involving the posterior left frontal and parietal lobes. 3. Generalized cerebral atrophy with mild to moderate chronic small vessel ischemic disease. 4. Enlarged bilateral level II cervical lymph nodes, incompletely evaluated on this exam, but may be similar relative to prior CT from 11/12/2014. These are of uncertain significance.   Electronically Signed   By: Jeannine Boga M.D.   On: 01/12/2015 03:12   Dg Chest Port 1 View  01/11/2015   CLINICAL DATA:  Altered mental status. Patient is unable to give information.  EXAM: PORTABLE CHEST - 1 VIEW  COMPARISON:  01/06/2015  FINDINGS: Postsurgical changes are seen within the right chest wall, query prior right breast/axilla surgery.  Cardiomediastinal silhouette is normal for portable technique. Mediastinal contours appear widened, which may be due to portable technique and low lung volumes.  There is no evidence of focal airspace consolidation, pleural effusion or pneumothorax. The lung volumes are low.  Osseous structures are without acute abnormality. Soft tissues are grossly normal.  IMPRESSION: Low lung volumes.  Widened mediastinal  contours, which may be partially artifactual. Attention on follow-up is recommended.   Electronically Signed   By: Fidela Salisbury M.D.   On: 01/11/2015 13:26    Scheduled Meds: . allopurinol  300 mg Oral Daily  . atorvastatin  40 mg Oral q1800  . hydrocortisone  1 application Rectal BID  . insulin aspart  0-20 Units Subcutaneous TID WC  . insulin aspart  0-5 Units Subcutaneous QHS  . metoprolol tartrate  12.5 mg Oral BID  .  montelukast  10 mg Oral QPM  . polyethylene glycol  17 g Oral Daily  . warfarin  0.5 mg Oral ONCE-1800  . Warfarin - Pharmacist Dosing Inpatient   Does not apply Q24H   Continuous Infusions: . sodium chloride 800 mL (01/12/15 1445)    Time spent: 25 minutes  [facility] [email or web address], password [facility] 01/12/2015, 4:36 PM  LOS: 1 day

## 2015-01-13 ENCOUNTER — Telehealth: Payer: Self-pay | Admitting: Family Medicine

## 2015-01-13 LAB — BASIC METABOLIC PANEL
Anion gap: 11 (ref 5–15)
BUN: 27 mg/dL — AB (ref 6–20)
CHLORIDE: 105 mmol/L (ref 101–111)
CO2: 24 mmol/L (ref 22–32)
CREATININE: 0.96 mg/dL (ref 0.44–1.00)
Calcium: 11 mg/dL — ABNORMAL HIGH (ref 8.9–10.3)
GFR calc Af Amer: >60 mL/min (ref >60)
GFR, EST NON AFRICAN AMERICAN: 57 mL/min — AB (ref >60)
GLUCOSE: 108 mg/dL — AB (ref 65–99)
Potassium: 3.5 mmol/L (ref 3.5–5.1)
SODIUM: 140 mmol/L (ref 135–145)

## 2015-01-13 LAB — GLUCOSE, CAPILLARY
GLUCOSE-CAPILLARY: 93 mg/dL (ref 65–99)
GLUCOSE-CAPILLARY: 97 mg/dL (ref 65–99)
Glucose-Capillary: 96 mg/dL (ref 65–99)
Glucose-Capillary: 135 mg/dL — ABNORMAL HIGH (ref 65–99)

## 2015-01-13 LAB — PROTIME-INR
INR: 2.96 — AB (ref 0.00–1.49)
Prothrombin Time: 30.3 seconds — ABNORMAL HIGH (ref 11.6–15.2)

## 2015-01-13 LAB — URINE CULTURE: CULTURE: NO GROWTH

## 2015-01-13 MED ORDER — SENNOSIDES-DOCUSATE SODIUM 8.6-50 MG PO TABS: 2.0000 | ORAL_TABLET | Freq: Every evening | ORAL | Status: DC | PRN

## 2015-01-13 MED ORDER — ACETAMINOPHEN 325 MG PO TABS
650.0000 mg | ORAL_TABLET | Freq: Four times a day (QID) | ORAL | Status: DC | PRN
  Administered 2015-01-13 – 2015-01-15 (×4): 650 mg via ORAL
  Filled 2015-01-13 (×4): qty 2

## 2015-01-13 MED ORDER — GABAPENTIN 100 MG PO CAPS
100.0000 mg | ORAL_CAPSULE | Freq: Every day | ORAL | Status: DC
  Administered 2015-01-13 – 2015-01-14 (×2): 100 mg via ORAL
  Filled 2015-01-13 (×2): qty 1

## 2015-01-13 MED ORDER — ACETAMINOPHEN 325 MG PO TABS: 650.0000 mg | ORAL_TABLET | ORAL | Status: DC | PRN

## 2015-01-13 MED ORDER — RESOURCE THICKENUP CLEAR PO POWD
ORAL | Status: DC | PRN
  Filled 2015-01-13: qty 125

## 2015-01-13 NOTE — Progress Notes (Signed)
Subjective: No significant change over night. IV in right arm has hematoma and patient is holding right arm up and mumbling/groaning.   Exam: Filed Vitals:   01/13/15 0603  BP: 96/50  Pulse: 104  Temp: 99 F (37.2 C)  Resp: 20    HEENT-  Normocephalic, no lesions, without obvious abnormality.  Normal external eye and conjunctiva.  Normal TM's bilaterally.  Normal auditory canals and external ears. Normal external nose, mucus membranes and septum.  Normal pharynx. Cardiovascular- S1, S2 normal, pulses palpable throughout   Lungs- no tachypnea, retractions or cyanosis, Heart exam - S1, S2 normal, no murmur, no gallop, rate regular Abdomen- normal findings: bowel sounds normal Extremities- no clubbing     Gen: In bed, NAD MS: alert, follows no commands, [facility]  Perseverating on her left arm region where IV is placed. Moaning a lot.  CN: Face equal and symmetric, no blink to threat bilaterally, PERRLA, winces to pain bilaterally Motor: no movement of right UE and minimal movement of right lower extremity.  Holding left UE antigravity and withdraws left LE briskly from pain.  Sensory: winces to pain bilaterally   Pertinent Labs: Ammonia 31 Cortisol 26.7 ABG--PCo238.7, Po2 119, bicarb23.6  Etta Quill PA-C [facility] [phone]  She appears much improved this afternoon. Stayed awake much of the night.   Impression: 74 yo F with baseline dementia and what I suspect is hospital related delirium likely worsened by dehydration. She worsened with environmental change going from [place] to home(had been at nsg home for some time prior to [facility] hospitalization) and then again worsened when brought here. She now appears to be improving. No other etiology has been found to account for her worsening currently.   Recommendations: 1) Continue supportive care, minimize stimulation at night, keep awake during day.  2) If continues to have  night/day reversal, could try low dose neuroleptic around 6pm.  3) will continue to follow.    Roland Rack, MD [facility] [phone]  If 7pm- 7am, please page neurology on call as listed in [place].  01/13/2015, 9:43 AM

## 2015-01-13 NOTE — Progress Notes (Addendum)
Speech Language Pathology Treatment: Dysphagia  Patient Details Name: Brittany Clarke MRN: [id number] DOB: 1941/05/08 Today's Date: 01/13/2015 Time: 7564-3329 SLP Time Calculation (min) (ACUTE ONLY): 40 min  Assessment / Plan / Recommendation Clinical Impression  Per family, pt with improved mentation today - pt alert and follow some one step commands.  She continues to be severely dysarthric and nearly unintelligible.  SLP obtained assistance to reposition pt better for po trials.  Xerostomic oral cavity noted but no exudate present.   Note left arm red and swollen - warm to touch and pt complaining of pain.  RN removed IV during session and pt reported improved comfort afterward.   Provided pt with ice chip, water via tsp/cup, nectar thick juice via tsp/cup/straw, applesauce and moistened cracker.  Excessively delayed swallow response noted due to decreased oral awareness, ? Motor planning deficits and poor attention.  Family left room to help pt's attention to task, pt benefited from max verbal/visual cues to swallow.  Pt swallow delayed up to 2 minutes with first bolus but warm-up effect noted with improved timeliness of swallow as po progressed.  There were no indications of airway compromise or residuals with intake - suspect pharyngeal swallow intact.  Anticipate pt intake to be very poor - as family present stated "That's the most she has eaten in a week" after consume consumed only a few bites.  Do not recommend solids at this time due to poor oral awareness, limited vertical mastication pattern and cognition.     Pt reportedly desires water - and therefore recommend pt consume thin water via tsp between meals only.  Nectar thick liquids with meals recommended to maximize airway protection/pt comfort due to level of oral dysphagia.  MD agree to plan.    Using teach back and swallow precautions written, educated family to recommendation.  Signs posted in room and RN informed.  SlP to follow up  for dietary advancement indication.  Thanks for this consult.    HPI Other Pertinent Information: 74 year old female readmitted 01/11/15 due to AMS, decreased renal function, hypotension. Pt was just recently admitted due to AMS. PMH significant for multiple co-morbidities, including CHF, DM, COPD, HTN, SOB, CVA (2013), PNA (2014), breast CA.    Pertinent Vitals Pain Assessment: Faces Pain Score: 4  Faces Pain Scale: Hurts little more Pain Location: arm, left Pain Intervention(s): Repositioned;Other (comment);Utilized relaxation techniques;Limited activity within patient's tolerance (rn removed pt's iv)  SLP Plan  Continue with current plan of care    Recommendations Diet recommendations: Nectar-thick liquid (full liquids, tsps thin water ok between meals only, nectar thick remainder of time) Liquids provided via: Cup;Straw;Teaspoon Medication Administration: Crushed with puree Supervision: Full supervision/cueing for compensatory strategies;Trained caregiver to feed patient (family present) Compensations: Slow rate;Small sips/bites;Other (Comment);Check for pocketing (observe pt swallow before giving more ) Postural Changes and/or Swallow Maneuvers: Seated upright 90 degrees;Upright 30-60 min after meal              Oral Care Recommendations: Oral care BID Follow up Recommendations: Skilled Nursing facility Plan: Continue with current plan of care    [place], [place] [facility] SLP [phone]

## 2015-01-13 NOTE — Progress Notes (Signed)
ANTICOAGULATION CONSULT NOTE - Follow Up Consult  Pharmacy Consult for Coumadin  Indication: hx of stroke  Allergies  Allergen Reactions  . Ultram [Tramadol] Shortness Of Breath    Patient Measurements: Height: 5\' 6"  (167.6 cm) Weight: 217 lb 3.2 oz (98.521 kg) IBW/kg (Calculated) : 59.3   Vital Signs: Temp: 98.9 F (37.2 C) (08/24 1026) Temp Source: Axillary (08/24 1026) BP: 130/90 mmHg (08/24 1026) Pulse Rate: 101 (08/24 1026)  Labs:  Recent Labs  01/11/15 1310 01/11/15 1720 01/12/15 1140 01/13/15 0549  HGB 8.8*  --   --   --   HCT 27.0*  --   --   --   PLT 72*  --   --   --   LABPROT  --  22.4* 23.5* 30.3*  INR  --  1.98* 2.11* 2.96*  CREATININE 1.57*  --   --  0.96    Estimated Creatinine Clearance: 61.8 mL/min (by C-G formula based on Cr of 0.96).   Assessment: 74yo female readmitted with decreased responsiveness transfer from [facility] to [facility] for further evaluation.  She is on coumadin PTA for hx of stroke. Noted INR up to 7.3 after one dose of coumadin 4mg  on 8/12, has been holding since then, INR 1.98 on 8/22 PM at [facility] >> warfarin 0.5mg  ordered on 8/22 and 8/23 but not given on either day. INR now back up to 2.96 despite not receiving any doses of warfarin. Most recent CBC low but stable, no bleeding noted.  Goal of Therapy:  INR 2-3 Monitor platelets by anticoagulation protocol: Yes   Plan:  Hold warfarin today Monitor daily INR, Q72h CBC, s/sx of bleeding  Drucie Opitz, PharmD Clinical Pharmacist Pager: [phone] 01/13/2015 1:59 PM

## 2015-01-13 NOTE — Progress Notes (Signed)
[facility] PROGRESS NOTE  Zahirah Cheslock WTU:[id number] DOB: 23-Aug-1940 DOA: 01/11/2015 PCP: [name], MD  Assessment/Plan:  Principal Problem:   Acute encephalopathy: Extensively worked up last admission. Here, ABG, NH3, EEG ok. Dehydrated and increased bun/creat. Neurology consulted and feels likely dementia with delirium secondary to alteration of sleep/wake cycle and change of location. Improving today, but not yet back to baseline. Hold sedating medications. ST started diet Active Problems: PN: resume neurontin low dose qhs   Anemia   HTN (hypertension)   DM II (diabetes mellitus, type II), controlled   AKI (acute kidney injury) improved Thrombocytopenia, chronic  Code Status:  full Family Communication:  Multiple at bedside Disposition Plan:  home  Consultants:  neurology  Procedures:     Antibiotics:    HPI/Subjective: Per family, c/o chronic leg neuropathy pain. Improving. Ate lunch  Objective: Filed Vitals:   01/13/15 1026  BP: 130/90  Pulse: 101  Temp: 98.9 F (37.2 C)  Resp: 18   No intake or output data in the 24 hours ending 01/13/15 1457 Filed Weights   01/12/15 0600  Weight: 98.521 kg (217 lb 3.2 oz)    Exam:   General:  Eyes closed. Answers a few questions occasionally. Moaning about leg pain  Cardiovascular: RRR without MGR  Respiratory: CTA without WRR  Abdomen: S nt, nd  Ext: no left arm with echymoses  Basic Metabolic Panel:  Recent Labs Lab 01/07/15 0533 01/08/15 0536 01/11/15 1310 01/13/15 0549  NA 136 136 136 140  K 4.0 3.8 3.9 3.5  CL 103 102 101 105  CO2 25 26 24 24   GLUCOSE 108* 80 96 108*  BUN 18 19 38* 27*  CREATININE 0.86 0.81 1.57* 0.96  CALCIUM 9.5 9.8 11.2* 11.0*  MG 1.5* 1.5*  --   --    Liver Function Tests:  Recent Labs Lab 01/07/15 0533 01/11/15 1310  AST 39 47*  ALT 12* 16  ALKPHOS 59 65  BILITOT 1.9* 2.0*  PROT 5.8* 6.2*  ALBUMIN 2.8* 3.1*   No results for input(s):  LIPASE, AMYLASE in the last 168 hours.  Recent Labs Lab 01/12/15 1145  AMMONIA 31   CBC:  Recent Labs Lab 01/07/15 0533 01/08/15 0536 01/09/15 0557 01/11/15 1310  WBC 7.2 6.6 6.8 7.8  NEUTROABS  --   --   --  3.4  HGB 8.4* 8.2* 8.6* 8.8*  HCT 25.4* 25.3* 25.8* 27.0*  MCV 82.2 82.4 82.4 83.1  PLT 78* 74* 76* 72*   Cardiac Enzymes: No results for input(s): CKTOTAL, CKMB, CKMBINDEX, TROPONINI in the last 168 hours. BNP (last 3 results) No results for input(s): BNP in the last 8760 hours.  ProBNP (last 3 results) No results for input(s): PROBNP in the last 8760 hours.  CBG:  Recent Labs Lab 01/12/15 1134 01/12/15 1623 01/12/15 2147 01/13/15 0638 01/13/15 1200  GLUCAP 103* 105* 100* 96 135*    Recent Results (from the past 240 hour(s))  Culture, blood (x 2)     Status: None   Collection Time: 01/04/15  7:28 AM  Result Value Ref Range Status   Specimen Description BLOOD LEFT HAND  Final   Special Requests BOTTLES DRAWN AEROBIC AND ANAEROBIC 5CC  Final   Culture NO GROWTH 5 DAYS  Final   Report Status 01/09/2015 FINAL  Final  Culture, blood (x 2)     Status: None   Collection Time: 01/04/15  7:30 AM  Result Value Ref Range Status   Specimen Description BLOOD  LEFT WRIST  Final   Special Requests BOTTLES DRAWN AEROBIC AND ANAEROBIC 5CC  Final   Culture NO GROWTH 5 DAYS  Final   Report Status 01/09/2015 FINAL  Final  Urine culture     Status: None   Collection Time: 01/04/15  3:15 PM  Result Value Ref Range Status   Specimen Description URINE, CATHETERIZED  Final   Special Requests NONE  Final   Culture   Final    NO GROWTH 2 DAYS Performed at [facility]    Report Status 01/06/2015 FINAL  Final  Rapid strep screen (not at [facility])     Status: None   Collection Time: 01/06/15  3:20 PM  Result Value Ref Range Status   Streptococcus, Group A Screen (Direct) NEGATIVE NEGATIVE Final    Comment: (NOTE) A Rapid Antigen test may result negative if the  antigen level in the sample is below the detection level of this test. The FDA has not cleared this test as a stand-alone test therefore the rapid antigen negative result has reflexed to a Group A Strep culture.   Culture, Group A Strep     Status: None   Collection Time: 01/06/15  3:20 PM  Result Value Ref Range Status   Strep A Culture Negative  Final    Comment: (NOTE) Performed At: [facility] [address] [place], [place] [id number] [name] MD JJ:[id number]   Urine culture     Status: None   Collection Time: 01/11/15  1:41 PM  Result Value Ref Range Status   Specimen Description URINE, CATHETERIZED  Final   Special Requests NONE  Final   Culture   Final    NO GROWTH 2 DAYS Performed at [facility]    Report Status 01/13/2015 FINAL  Final  Urine culture     Status: None   Collection Time: 01/11/15  7:00 PM  Result Value Ref Range Status   Specimen Description URINE, RANDOM  Final   Special Requests NONE  Final   Culture CANCELLED BY LAB DUPLICATE REQUEST  Final   Report Status 01/13/2015 FINAL  Final     Studies: Mr Brain Wo Contrast  01/12/2015   CLINICAL DATA:  Initial evaluation for acute altered mental status.  EXAM: MRI HEAD WITHOUT CONTRAST  TECHNIQUE: Multiplanar, multiecho pulse sequences of the brain and surrounding structures were obtained without intravenous contrast.  COMPARISON:  Prior CT from earlier the same day as well as previous MRI from 02/08/2009 and CT from 11/12/2014.  FINDINGS: Study is degraded by motion artifact.  Diffuse prominence of the CSF containing spaces is compatible with generalized cerebral atrophy. Patchy and confluent T2/FLAIR hyperintensity within the periventricular and deep white matter of both cerebral hemispheres most consistent with chronic small vessel ischemic disease. Encephalomalacia within the posterior left frontal lobe and parasagittal left frontal parietal region seen, most consistent with remote  ischemic infarct. Associated gliosis and chronic hemosiderin staining present.  No abnormal foci of restricted diffusion to suggest acute intracranial infarct. Gray-white matter differentiation maintained. Normal intravascular flow voids are maintained. No acute intracranial hemorrhage.  No mass lesion, midline shift, or mass effect. No hydrocephalus. No extra-axial fluid collection.  Craniocervical junction within normal limits. Degenerative spondylolysis present within the upper cervical spine without significant stenosis.  Incidental note made of a partially empty sella. No acute abnormality about the orbits.  Chronic left maxillary sinus disease noted. Paranasal sinuses are otherwise clear. No mastoid effusion. Inner ear structures grossly normal.  Bone marrow signal intensity within normal limits. Scalp soft tissues unremarkable.  Incidental note made of enlarged bilateral cervical lymph nodes at level 2, measuring up to 17 mm on the left and 11 mm on the right, indeterminate. These may be similar relative to prior CT from 11/12/2014.  IMPRESSION: 1. No acute intracranial process identified. 2. Remote left MCA territory infarct involving the posterior left frontal and parietal lobes. 3. Generalized cerebral atrophy with mild to moderate chronic small vessel ischemic disease. 4. Enlarged bilateral level II cervical lymph nodes, incompletely evaluated on this exam, but may be similar relative to prior CT from 11/12/2014. These are of uncertain significance.   Electronically Signed   By: Jeannine Boga M.D.   On: 01/12/2015 03:12    Scheduled Meds: . insulin aspart  0-20 Units Subcutaneous TID WC  . insulin aspart  0-5 Units Subcutaneous QHS  . metoprolol tartrate  12.5 mg Oral BID  . montelukast  10 mg Oral QPM  . warfarin  0.5 mg Oral ONCE-1800  . Warfarin - Pharmacist Dosing Inpatient   Does not apply Q24H   Continuous Infusions: . sodium chloride 100 mL/hr at 01/12/15 2159    Time spent:  25 minutes  [facility] [email or web address], password [email or web address] 01/13/2015, 2:57 PM  LOS: 2 days

## 2015-01-14 LAB — GLUCOSE, CAPILLARY
GLUCOSE-CAPILLARY: 111 mg/dL — AB (ref 65–99)
GLUCOSE-CAPILLARY: 106 mg/dL — AB (ref 65–99)
Glucose-Capillary: 93 mg/dL (ref 65–99)
Glucose-Capillary: 96 mg/dL (ref 65–99)

## 2015-01-14 LAB — PROTIME-INR
INR: 4.67 — AB (ref 0.00–1.49)
PROTHROMBIN TIME: 42.7 s — AB (ref 11.6–15.2)

## 2015-01-14 MED ORDER — ENSURE ENLIVE PO LIQD
237.0000 mL | Freq: Two times a day (BID) | ORAL | Status: DC
  Administered 2015-01-14 – 2015-01-15 (×3): 237 mL via ORAL
  Filled 2015-01-14 (×5): qty 237

## 2015-01-14 NOTE — Progress Notes (Signed)
Subjective: No change over night. Appears to improve slightly as she can recognize family and the fact she is in hospital,   Exam: Filed Vitals:   01/14/15 0549  BP: 119/53  Pulse: 100  Temp: 100.4 F (38 C)  Resp: 20    HEENT-  Normocephalic, no lesions, without obvious abnormality.  Normal external eye and conjunctiva.  Normal TM's bilaterally.  Normal auditory canals and external ears. Normal external nose, mucus membranes and septum.  Normal pharynx. Cardiovascular- S1, S2 normal, pulses palpable throughout   Lungs- no tachypnea, retractions or cyanosis, Heart exam - S1, S2 normal, no murmur, no gallop, rate regular Abdomen- normal findings: bowel sounds normal  Gen: In bed, NAD MS: alert, follows no commands, [facility] Perseverating on her left arm region where IV is placed. Moaning a lot.  CN: Face equal and symmetric, no blink to threat bilaterally, PERRLA, winces to pain bilaterally Motor: no movement of right UE and minimal movement of right lower extremity. Holding left UE antigravity and withdraws left LE briskly from pain.  Sensory: winces to pain bilaterally    Pertinent Labs: none    Impression: 74 yo F with baseline dementia and what I suspect is hospital related delirium likely worsened by dehydration. She worsened with environmental change going from [place] to home(had been at nsg home for some time prior to [facility] hospitalization) and then again worsened when brought here. She now appears to be improving. No other etiology has been found to account for her worsening currently   Recommendations: 1)Continue supportive care, minimize stimulation at night, keep awake during day.  2) If continues to have night/day reversal, could try low dose neuroleptic around 6pm.   Etta Quill PA-C [facility] [phone]  01/14/2015, 9:00 AM

## 2015-01-14 NOTE — Progress Notes (Signed)
[facility] PROGRESS NOTE  Brittany Clarke NAT:[id number] DOB: 08-29-1940 DOA: 01/11/2015 PCP: [name], Clarke  Assessment/Plan:    Acute encephalopathy: Extensively worked up last admission. Here, ABG, NH3, EEG ok. Dehydrated and increased bun/creat. Neurology consulted and feels likely dementia with delirium secondary to alteration of sleep/wake cycle and change of location. Improving today, but not yet back to baseline. Hold sedating medications. ST started diet Needs palliative care- ? Baseline  PN: resume neurontin low dose qhs   Anemia   HTN (hypertension)   DM II (diabetes mellitus, type II), controlled   AKI (acute kidney injury) improved Thrombocytopenia, chronic  Code Status:  full Family Communication:  Multiple at bedside- spoke with son regarding palliative care consult-- called daughter no answer Disposition Plan:  home  Consultants:  neurology  Procedures:     Antibiotics:    HPI/Subjective: More awke- trying to work with PT  Objective: Filed Vitals:   01/14/15 1035  BP: 104/51  Pulse: 99  Temp: 99.7 F (37.6 C)  Resp: 20   No intake or output data in the 24 hours ending 01/14/15 1252 Filed Weights   01/12/15 0600  Weight: 98.521 kg (217 lb 3.2 oz)    Exam:   General:  Working with PT--- chroncially ill appearing, obese  Cardiovascular: RRR without MGR  Respiratory: CTA without WRR  Abdomen: S nt, nd  Ext: no left arm with echymoses  Basic Metabolic Panel:  Recent Labs Lab 01/08/15 0536 01/11/15 1310 01/13/15 0549  NA 136 136 140  K 3.8 3.9 3.5  CL 102 101 105  CO2 26 24 24   GLUCOSE 80 96 108*  BUN 19 38* 27*  CREATININE 0.81 1.57* 0.96  CALCIUM 9.8 11.2* 11.0*  MG 1.5*  --   --    Liver Function Tests:  Recent Labs Lab 01/11/15 1310  AST 47*  ALT 16  ALKPHOS 65  BILITOT 2.0*  PROT 6.2*  ALBUMIN 3.1*   No results for input(s): LIPASE, AMYLASE in the last 168 hours.  Recent Labs Lab  01/12/15 1145  AMMONIA 31   CBC:  Recent Labs Lab 01/08/15 0536 01/09/15 0557 01/11/15 1310  WBC 6.6 6.8 7.8  NEUTROABS  --   --  3.4  HGB 8.2* 8.6* 8.8*  HCT 25.3* 25.8* 27.0*  MCV 82.4 82.4 83.1  PLT 74* 76* 72*   Cardiac Enzymes: No results for input(s): CKTOTAL, CKMB, CKMBINDEX, TROPONINI in the last 168 hours. BNP (last 3 results) No results for input(s): BNP in the last 8760 hours.  ProBNP (last 3 results) No results for input(s): PROBNP in the last 8760 hours.  CBG:  Recent Labs Lab 01/13/15 1200 01/13/15 1701 01/13/15 2157 01/14/15 0626 01/14/15 1131  GLUCAP 135* 93 97 93 96    Recent Results (from the past 240 hour(s))  Urine culture     Status: None   Collection Time: 01/04/15  3:15 PM  Result Value Ref Range Status   Specimen Description URINE, CATHETERIZED  Final   Special Requests NONE  Final   Culture   Final    NO GROWTH 2 DAYS Performed at [facility]    Report Status 01/06/2015 FINAL  Final  Rapid strep screen (not at [facility])     Status: None   Collection Time: 01/06/15  3:20 PM  Result Value Ref Range Status   Streptococcus, Group A Screen (Direct) NEGATIVE NEGATIVE Final    Comment: (NOTE) A Rapid Antigen test may result negative if the  antigen level in the sample is below the detection level of this test. The FDA has not cleared this test as a stand-alone test therefore the rapid antigen negative result has reflexed to a Group A Strep culture.   Culture, Group A Strep     Status: None   Collection Time: 01/06/15  3:20 PM  Result Value Ref Range Status   Strep A Culture Negative  Final    Comment: (NOTE) Performed At: [facility] [address] [place], [place] [id number] Brittany Clarke JT:[id number]   Urine culture     Status: None   Collection Time: 01/11/15  1:41 PM  Result Value Ref Range Status   Specimen Description URINE, CATHETERIZED  Final   Special Requests NONE  Final   Culture   Final    NO  GROWTH 2 DAYS Performed at [facility]    Report Status 01/13/2015 FINAL  Final  Urine culture     Status: None   Collection Time: 01/11/15  7:00 PM  Result Value Ref Range Status   Specimen Description URINE, RANDOM  Final   Special Requests NONE  Final   Culture CANCELLED BY LAB DUPLICATE REQUEST  Final   Report Status 01/13/2015 FINAL  Final     Studies: No results found.  Scheduled Meds: . gabapentin  100 mg Oral QHS  . insulin aspart  0-20 Units Subcutaneous TID WC  . insulin aspart  0-5 Units Subcutaneous QHS  . metoprolol tartrate  12.5 mg Oral BID  . montelukast  10 mg Oral QPM  . Warfarin - Pharmacist Dosing Inpatient   Does not apply Q24H   Continuous Infusions: . sodium chloride 100 mL/hr at 01/12/15 2159    Time spent: 25 minutes  Izetta Sakamoto  [facility] [email or web address], password [facility] 01/14/2015, 12:52 PM  LOS: 3 days

## 2015-01-14 NOTE — Progress Notes (Signed)
Nutrition Follow-up  DOCUMENTATION CODES:   Obesity unspecified, Non-severe (moderate) malnutrition in context of acute illness/injury  INTERVENTION:  Provide Ensure Enlive po BID, each supplement provides 350 kcal and 20 grams of protein Magic Cup with meals   NUTRITION DIAGNOSIS:   Malnutrition related to lethargy/confusion, poor appetite, inability to eat as evidenced by energy intake < 75% for > 7 days, mild depletion of muscle mass, percent weight loss.  Ongoing  GOAL:   Patient will meet greater than or equal to 90% of their needs  Unmet  MONITOR:   Diet advancement, Labs, Weight trends, Skin, PO intake  REASON FOR ASSESSMENT:   Low [place]    ASSESSMENT:   74 year old female readmitted 01/11/15 due to AMS, decreased renal function, hypotension. Pt was just recently admitted due to AMS. PMH significant for multiple co-morbidities, including CHF, DM, COPD, HTN, SOB, CVA (2013), PNA (2014), breast CA.   Pt was advanced to a full liquid/nectar-thick liquid diet yesterday. Per pt's family at bedside pt only drank 4 ounces of juice for breakfast and a couple bites/sips of each food on lunch tray. Family reports pt drank Ensure supplements at home. Pt much more alert today and states she is not hungry. She likes Ensure and is agreeable to receiving between meals.   Diet Order:  Diet full liquid Room service appropriate?: Yes with Assist; Fluid consistency:: Nectar Thick  Skin:  Reviewed, no issues  Last BM:  8/24  Height:   Ht Readings from Last 1 Encounters:  01/12/15 5\' 6"  (1.676 m)    Weight:   Wt Readings from Last 1 Encounters:  01/12/15 217 lb 3.2 oz (98.521 kg)    Ideal Body Weight:  59.1 kg  BMI:  Body mass index is 35.07 kg/(m^2).  Estimated Nutritional Needs:   Kcal:  1600-1800  Protein:  100-110 grams  Fluid:  1.6-1.8 L/day  EDUCATION NEEDS:   No education needs identified at this time  [place], LDN Inpatient Clinical  Dietitian Pager: [phone] After Hours Pager: [phone]

## 2015-01-14 NOTE — Progress Notes (Addendum)
Speech Language Pathology Treatment: Dysphagia  Patient Details Name: Brittany Clarke MRN: [id number] DOB: Sep 24, 1940 Today's Date: 01/14/2015 Time: 7494-4967 SLP Time Calculation (min) (ACUTE ONLY): 22 min  Assessment / Plan / Recommendation Clinical Impression  Pt today sleepy, dysarthric and noted to have temperatures over night. RN reports pt recently received Tylenol.  Granddaughter providing pt with orange juice via straw upon SLP entrance to room.  Delayed swallow response continues but no clinical indication of airway compromise. Pt required max verbal cues to swallow.   Informed granddaughter to assure remove straw once pt has obtained adequate amount of liquid in oral cavity with min assist. Advised to consider using cup or tsp instead of straw.   Pt able to produce cough on command however cough was weak and not protective.Note full tray of THIN LIQUIDS in room and family confirms yesterday pt received THIN despite order for [place].  Family *son and granddaughter* deny pt coughing with intake yesterday.  Intake listed as 10-25%. Thickened mild and juice in front of family to educate them to proper use.   Informed family to only give pt thin water via tsp between meals - remainder of time give pt only thick liquids.  Pt falling asleep during session and therefore SLP did not provide further intake.  Advised family not to feed pt unless fully alert.   Recommend continue diet with strict aspiration precautions. SLP to follow up.     HPI Other Pertinent Information: 74 year old female readmitted 01/11/15 due to AMS, decreased renal function, hypotension. Pt was just recently admitted due to AMS. PMH significant for multiple co-morbidities, including CHF, DM, COPD, HTN, SOB, CVA (2013), PNA (2014), breast CA.   Pt had BSE yesterday and was started on full liquid - nectar consistency.  SLP follow up re: dysphagia today to determine tolerance of intake and readiness for dietary  advancemet.     Pertinent Vitals Pain Assessment: Faces Faces Pain Scale: Hurts a little bit Pain Intervention(s): Limited activity within patient's tolerance;Monitored during session  SLP Plan  Continue with current plan of care    Recommendations Diet recommendations: Nectar-thick liquid (full liquids nectar only, tsps thin water ok betwen meals) Liquids provided via: Cup;Straw;Teaspoon Medication Administration: Whole meds with puree (or crush if not contraindicated) Supervision: Patient able to self feed Compensations: Slow rate;Small sips/bites;Check for pocketing (delayed swallow - observe swallow before giving more) Postural Changes and/or Swallow Maneuvers: Upright 30-60 min after meal;Seated upright 90 degrees              Oral Care Recommendations: Oral care before and after PO Plan: Continue with current plan of care    [place], [place], [place] [facility] SLP [phone]

## 2015-01-14 NOTE — Evaluation (Signed)
Physical Therapy Evaluation Patient Details Name: Brittany Clarke MRN: [id number] DOB: 07/04/1940 Today's Date: 01/14/2015   History of Present Illness  This is a 74 year old lady who, according to her children who are at the bedside, has had alteration in her mental status over the last 3-4 days. She has had decreased alertness and decreased by mouth intake. According to her son, she is normally conversant and able to walk with a walker. She has been in the skilled nursing facility for the last 20 days or so receiving rehabilitation from a stroke in 2013. She did not have any history of nausea, vomiting, fever or abdominal pain. There is no history of dyspnea or chest pain.   Clinical Impression  Patient demonstrates deficits in functional mobility as indicated below. Will need continued skilled PT to address deficits and maximize function. Will see as indicated and progress as tolerated. At this time, patient significantly limited with mobility, will need SNF.    Follow Up Recommendations SNF;Supervision/Assistance - 24 hour    Equipment Recommendations  Other (comment) (hoyer lift)    Recommendations for Other Services       Precautions / Restrictions Precautions Precautions: Fall Precaution Comments: INR>4 Ok to see bed level per MD Restrictions Weight Bearing Restrictions: No      Mobility  Bed Mobility Overal bed mobility: Needs Assistance Bed Mobility: Supine to Sit;Sit to Supine     Supine to sit: Total assist;+2 for physical assistance Sit to supine: Total assist;+2 for physical assistance   General bed mobility comments: Patient assist to EOB using helcopter technique with chuck pad and 2 person physical assist  Transfers                 General transfer comment: did not perform, will need lift equipment  Ambulation/Gait                Stairs            Wheelchair Mobility    Modified Rankin (Stroke Patients Only)       Balance Overall  balance assessment: Needs assistance   Sitting balance-Leahy Scale: Poor Sitting balance - Comments: patient unable to maintain sitting balance without assist, posterior lean noted Postural control: Posterior lean                                   Pertinent Vitals/Pain Pain Assessment: Faces Faces Pain Scale: Hurts a little bit Pain Intervention(s): Limited activity within patient's tolerance;Monitored during session    Home Living Family/patient expects to be discharged to:: Skilled nursing facility Living Arrangements: Children Available Help at Discharge: Family;Personal care attendant;Available 24 hours/day Type of Home: Apartment Home Access: Level entry     Home Layout: One level Home Equipment: Wheelchair - manual;Cane - quad;Bedside commode;Hospital bed;Tub bench;Other (comment) (lift chair) Additional Comments: history taken from chart review of previous session    Prior Function                 Hand Dominance        Extremity/Trunk Assessment               Lower Extremity Assessment: Generalized weakness;RLE deficits/detail;LLE deficits/detail RLE Deficits / Details: strength 2-/5 due to old stroke with decreased ROM, postivie for clonus with assessment. LLE Deficits / Details: strength 2+/5 unable to perform movements against gravity.     Communication      Cognition Arousal/Alertness: Awake/alert Behavior  During Therapy: Flat affect Overall Cognitive Status: Difficult to assess Area of Impairment: Orientation;Attention;Following commands;Awareness;Problem solving Orientation Level: Disoriented to;Time;Situation Current Attention Level: Focused   Following Commands: Follows one step commands inconsistently;Follows one step commands with increased time     Problem Solving: Slow processing;Decreased initiation      General Comments      Exercises        Assessment/Plan    PT Assessment Patient needs continued PT  services  PT Diagnosis Generalized weakness;Hemiplegia dominant side   PT Problem List Decreased strength;Decreased range of motion;Decreased activity tolerance;Decreased balance;Decreased mobility;Obesity;Impaired tone  PT Treatment Interventions Functional mobility training;Therapeutic activities;Therapeutic exercise;Balance training   PT Goals (Current goals can be found in the Care Plan section) Acute Rehab PT Goals Patient Stated Goal: none stated Time For Goal Achievement: 01/28/15 Potential to Achieve Goals: Poor    Frequency Min 3X/week   Barriers to discharge        Co-evaluation PT/OT/SLP Co-Evaluation/Treatment: Yes Reason for Co-Treatment: Complexity of the patient's impairments (multi-system involvement);For patient/therapist safety PT goals addressed during session: Mobility/safety with mobility OT goals addressed during session: ADL's and self-care       End of Session   Activity Tolerance: Patient limited by fatigue;Other (comment) (INR) Patient left: in bed;with call bell/phone within reach;with bed alarm set Nurse Communication: Mobility status         Time: 8182-9937 PT Time Calculation (min) (ACUTE ONLY): 17 min   Charges:   PT Evaluation $Initial PT Evaluation Tier I: 1 Procedure     PT G CodesDuncan Dull 2015-01-27, [place], PT DPT  [phone]

## 2015-01-14 NOTE — Evaluation (Signed)
Occupational Therapy Evaluation Patient Details Name: Brittany Clarke MRN: [id number] DOB: [birth year] Today's Date: 01/14/2015    History of Present Illness This is a 74 year old lady who, according to her children who are at the bedside, has had alteration in her mental status over the last 3-4 days. She has had decreased alertness and decreased by mouth intake. According to her son, she is normally conversant and able to walk with a walker. She has been in the skilled nursing facility for the last 20 days or so receiving rehabilitation from a stroke in 2013. She did not have any history of nausea, vomiting, fever or abdominal pain. There is no history of dyspnea or chest pain.    Clinical Impression   Pt admitted with above and presents with deficits in functional mobility as indicated below.  Pt will benefit from continued OT to address deficits, maximize function and participation in ADLs.  Will follow as indicated and progress as tolerated.  Due to pt's significant limitation with mobility and self-care tasks, pt would benefit from SNF.    Follow Up Recommendations  SNF    Equipment Recommendations  None recommended by OT (TBD at next venue of care)    Recommendations for Other Services       Precautions / Restrictions Precautions Precautions: Fall Precaution Comments: INR>4 Ok to see bed level per MD Restrictions Weight Bearing Restrictions: No      Mobility Bed Mobility Overal bed mobility: Needs Assistance Bed Mobility: Supine to Sit;Sit to Supine     Supine to sit: Total assist;+2 for physical assistance Sit to supine: Total assist;+2 for physical assistance   General bed mobility comments: Patient assist to EOB using helcopter technique with chuck pad and 2 person physical assist  Transfers                 General transfer comment: did not perform, will need lift equipment    Balance Overall balance assessment: Needs assistance   Sitting balance-Leahy  Scale: Poor Sitting balance - Comments: patient unable to maintain sitting balance without assist, posterior lean noted Postural control: Posterior lean                                        Vision Vision Assessment?: Vision impaired- to be further tested in functional context Additional Comments: difficult to assess secondary to cognition, did scan to Rt and Lt visual field to follow therapist voice          Pertinent Vitals/Pain Pain Assessment: Faces Faces Pain Scale: Hurts a little bit Pain Intervention(s): Limited activity within patient's tolerance;Monitored during session     Hand Dominance Right   Extremity/Trunk Assessment Upper Extremity Assessment Upper Extremity Assessment: Generalized weakness;RUE deficits/detail;LUE deficits/detail RUE Deficits / Details: old stroke with no function of hand, able to elicit minimal shoulder and elbow mobility in gravity elminated  RUE Sensation: decreased proprioception RUE Coordination: decreased fine motor;decreased gross motor LUE Deficits / Details: decreased mobility, shoulder flexion to approx 70 degrees with visual and verbal cue, loose gross grap LUE Coordination: decreased gross motor   Lower Extremity Assessment Lower Extremity Assessment: Generalized weakness;RLE deficits/detail;LLE deficits/detail RLE Deficits / Details: strength 2-/5 due to old stroke with decreased ROM, postivie for clonus with assessment. LLE Deficits / Details: strength 2+/5 unable to perform movements against gravity.       Communication Communication Communication: No difficulties   Cognition Arousal/Alertness:  Awake/alert Behavior During Therapy: Flat affect Overall Cognitive Status: Difficult to assess Area of Impairment: Orientation;Attention;Following commands;Awareness;Problem solving Orientation Level: Disoriented to;Time;Situation Current Attention Level: Focused   Following Commands: Follows one step commands  inconsistently;Follows one step commands with increased time     Problem Solving: Slow processing;Decreased initiation                Home Living Family/patient expects to be discharged to:: Skilled nursing facility Living Arrangements: Children Available Help at Discharge: Family;Personal care attendant;Available 24 hours/day Type of Home: Apartment Home Access: Level entry     Home Layout: One level     Bathroom Shower/Tub: Tub/shower unit         Home Equipment: Wheelchair - manual;Cane - quad;Bedside commode;Hospital bed;Tub bench;Other (comment) (lift chair)   Additional Comments: history taken from chart review of previous session      Prior Functioning/Environment Level of Independence: Needs assistance  Gait / Transfers Assistance Needed: per pt's son, she ambulated with a quad cane or RW prior to going to [facility] 3 weeks ago...she states that she was in the bed or a w/c most of the time at [facility] and now can't walk at all ADL's / Homemaking Assistance Needed: assist with all ADLs        OT Diagnosis: Generalized weakness;Hemiplegia dominant side;Altered mental status   OT Problem List: Decreased strength;Decreased range of motion;Decreased activity tolerance;Impaired balance (sitting and/or standing);Impaired vision/perception;Decreased coordination;Decreased knowledge of use of DME or AE;Impaired tone;Impaired UE functional use;Pain   OT Treatment/Interventions: Self-care/ADL training;Neuromuscular education;DME and/or AE instruction;Therapeutic activities;Cognitive remediation/compensation;Patient/family education;Visual/perceptual remediation/compensation;Balance training    OT Goals(Current goals can be found in the care plan section) Acute Rehab OT Goals Patient Stated Goal: none stated Time For Goal Achievement: 01/28/15 Potential to Achieve Goals: Poor  OT Frequency: Min 3X/week           Co-evaluation PT/OT/SLP Co-Evaluation/Treatment:  Yes Reason for Co-Treatment: Complexity of the patient's impairments (multi-system involvement);For patient/therapist safety PT goals addressed during session: Mobility/safety with mobility OT goals addressed during session: ADL's and self-care      End of Session    Activity Tolerance: Patient limited by fatigue;Patient limited by lethargy;Treatment limited secondary to medical complications (Comment) (elevated INR) Patient left: in bed;with bed alarm set;with nursing/sitter in room;with family/visitor present   Time: 1761-6073 OT Time Calculation (min): 17 min Charges:  OT Evaluation $Initial OT Evaluation Tier I: 1 Procedure G-CodesSimonne Come, [id number] 01/14/2015, 11:34 AM

## 2015-01-14 NOTE — Progress Notes (Signed)
ANTICOAGULATION CONSULT NOTE - Follow Up Consult  Pharmacy Consult for Coumadin Indication: h/o CVA  Allergies  Allergen Reactions  . Ultram [Tramadol] Shortness Of Breath    Patient Measurements: Height: 5\' 6"  (167.6 cm) Weight: 217 lb 3.2 oz (98.521 kg) IBW/kg (Calculated) : 59.3  Vital Signs: Temp: 99.7 F (37.6 C) (08/25 1035) Temp Source: Oral (08/25 1035) BP: 104/51 mmHg (08/25 1035) Pulse Rate: 99 (08/25 1035)  Labs:  Recent Labs  01/12/15 1140 01/13/15 0549 01/14/15 0544  LABPROT 23.5* 30.3* 42.7*  INR 2.11* 2.96* 4.67*  CREATININE  --  0.96  --     Estimated Creatinine Clearance: 61.8 mL/min (by C-G formula based on Cr of 0.96).  Assessment: 37 YOF readmitted with decreased responsiveness transfer from [facility] to [facility] for further evaluation.  Recently hospitalized at [facility] (8/12 - 8/20)  Anticoagulation: Coumadin PTA for hx of stroke. PTA dose unknown due to elevated INR 7.12 as far back as 12/28/14. INR has continued to increase again since 8/11. 8/12: (INR 2.19) 4mg  ordered>>all doses held and INR climbed to 7.32>>then descended to 1.98 8/22: (INR 1.98) 0.5mg  ordered>>INR 2.11 8/23 (INR 2.11) 2.mg ordered>>INR climbed back up to 4.67 today  Goal of Therapy:  INR 2-3 Monitor platelets by anticoagulation protocol: Yes   Plan:  Continue to hold coumadin for now Daily PT/INR   Mirissa Lopresti S. Alford Highland, PharmD, BCPS Clinical Staff Pharmacist Pager [phone]  Eilene Ghazi Stillinger 01/14/2015,2:07 PM

## 2015-01-14 NOTE — Care Management Important Message (Addendum)
Important Message  Patient Details  Name: Brittany Clarke MRN: [id number] Date of Birth: 09-23-1940   Medicare Important Message Given:  Yes-second notification given    Delorse Lek 01/14/2015, 11:57 AM

## 2015-01-15 DIAGNOSIS — R4182 Altered mental status, unspecified: Secondary | ICD-10-CM

## 2015-01-15 DIAGNOSIS — R531 Weakness: Secondary | ICD-10-CM

## 2015-01-15 DIAGNOSIS — Z515 Encounter for palliative care: Secondary | ICD-10-CM

## 2015-01-15 LAB — PROTIME-INR
INR: 4.18 — ABNORMAL HIGH (ref 0.00–1.49)
PROTHROMBIN TIME: 39.3 s — AB (ref 11.6–15.2)

## 2015-01-15 LAB — PROTEIN ELECTROPHORESIS, SERUM
A/G Ratio: 0.9 (ref 0.7–1.7)
ALBUMIN ELP: 2.6 g/dL — AB (ref 2.9–4.4)
Alpha-1-Globulin: 0.3 g/dL (ref 0.0–0.4)
Alpha-2-Globulin: 0.5 g/dL (ref 0.4–1.0)
BETA GLOBULIN: 1 g/dL (ref 0.7–1.3)
GAMMA GLOBULIN: 1 g/dL (ref 0.4–1.8)
Globulin, Total: 2.8 g/dL (ref 2.2–3.9)
TOTAL PROTEIN ELP: 5.4 g/dL — AB (ref 6.0–8.5)

## 2015-01-15 LAB — GLUCOSE, CAPILLARY
GLUCOSE-CAPILLARY: 102 mg/dL — AB (ref 65–99)
GLUCOSE-CAPILLARY: 91 mg/dL (ref 65–99)
Glucose-Capillary: 93 mg/dL (ref 65–99)

## 2015-01-15 MED ORDER — GABAPENTIN 100 MG PO CAPS: 100.0000 mg | ORAL_CAPSULE | Freq: Every day | ORAL | Status: DC

## 2015-01-15 MED ORDER — ENSURE ENLIVE PO LIQD: 237.0000 mL | Freq: Two times a day (BID) | ORAL | Status: DC

## 2015-01-15 NOTE — Care Management Note (Signed)
Case Management Note  Patient Details  Name: Brittany Clarke MRN: [id number] Date of Birth: February 04, 1941  Subjective/Objective:                    Action/Plan: Patient will be discharging home with home health services, per family request. Miranda with [facility] was notified that patient will be returning home under their care.  Expected Discharge Date:  01/15/15               Expected Discharge Plan:  [place] (Patient was recently discharged home with [facility]. Was readmitted prior to initial visit. Awaiting PT/OT evals for disposition.)  In-House Referral:  Clinical Social Work  Discharge planning Services  CM Consult  Post Acute Care Choice:  Home Health Choice offered to:  Patient  DME Arranged:    DME Agency:     HH Arranged:  RN, PT, OT, Nurse's Aide (CSW) HH Agency:  [place]  Status of Service:  Completed, signed off  Medicare Important Message Given:  Yes-second notification given Date Medicare IM Given:    Medicare IM give by:    Date Additional Medicare IM Given:    Additional Medicare Important Message give by:     If discussed at [place] of Stay Meetings, dates discussed:    Additional Comments:  Rolm Baptise, RN 01/15/2015, 3:40 PM

## 2015-01-15 NOTE — Discharge Summary (Signed)
Physician Discharge Summary  Brittany Clarke NUU:[id number] DOB: 02/25/1941 DOA: 01/11/2015  PCP: [name], MD  Admit date: 01/11/2015 Discharge date: 01/15/2015  Time spent: 35 minutes  Recommendations for Outpatient Follow-up:  1. INR Monday 2. Consider biopsy of lymph node if no improvement: Enlarged bilateral level II cervical lymph nodes seen on MRI 3. SPEP in process 4. Home health with 24 hour care by family 7. Consider hospice for FTT if family agreeable  Discharge Diagnoses:  Principal Problem:   Acute encephalopathy Active Problems:   Anemia   HTN (hypertension)   DM II (diabetes mellitus, type II), controlled   Charcot foot due to diabetes mellitus   AKI (acute kidney injury)   UTI (lower urinary tract infection)   Discharge Condition: stable  Diet recommendation: liquid diet with aspiration precuations  Filed Weights   01/12/15 0600  Weight: 98.521 kg (217 lb 3.2 oz)    History of present illness:  Brittany Clarke is a 73 y.o. female  This is a 74 year old lady who I admitted to the hospital 10 days ago with altered mental status. I felt that she was dehydrated at that time. She spent approximately 8 days in the hospital and was discharged 2 days ago to the skilled nursing facility. Prior to discharge, she was alert, smiling and appeared to be back to her usual self. Now she comes with decreased responsiveness. She is unable to give me a history and there are no family members at the bedside. Evaluation in the emergency room shows her to have worsening renal function to some degree and relative hypotension. She is now being admitted for further management.  Hospital Course:  Acute encephalopathy: Extensively worked up last admission. Here, ABG, NH3, EEG ok. Dehydrated and increased bun/creat. Neurology consulted and feels likely dementia with delirium secondary to alteration of sleep/wake cycle and change of location. Improving.  Hold sedating medications.  ST started diet Palliative care consult but not all family available-- will see if home health social worker can refer to hospice  PN: resume neurontin low dose qhs  Anemia  HTN (hypertension)  DM II (diabetes mellitus, type II), controlled  AKI (acute kidney injury) improved Thrombocytopenia, chronic   Procedures:    Consultations:  palliative care  Discharge Exam: Filed Vitals:   01/15/15 1344  BP: 112/52  Pulse: 96  Temp: 100.3 F (37.9 C)  Resp: 20    General: awake, smiling answers simple questions Cardiovascular: rrr Respiratory: clear  Discharge Instructions   Discharge Instructions    Discharge instructions    Complete by:  As directed   24 hour care Home health Liquid diet     Increase activity slowly    Complete by:  As directed           Current Discharge Medication List    START taking these medications   Details  feeding supplement, ENSURE ENLIVE, (ENSURE ENLIVE) LIQD Take 237 mLs by mouth 2 (two) times daily between meals. Qty: 237 mL, Refills: 12      CONTINUE these medications which have CHANGED   Details  gabapentin (NEURONTIN) 100 MG capsule Take 1 capsule (100 mg total) by mouth at bedtime. Qty: 30 capsule, Refills: 0      CONTINUE these medications which have NOT CHANGED   Details  atorvastatin (LIPITOR) 40 MG tablet TAKE ONE (1) TABLET EACH DAY Qty: 30 tablet, Refills: 0    metoprolol tartrate (LOPRESSOR) 25 MG tablet TAKE 1/2 TABLET TWICE DAILY Qty: 30 tablet, Refills:  2    montelukast (SINGULAIR) 10 MG tablet TAKE 1 TABLET DAILY IN THE EVENING Qty: 30 tablet, Refills: 1      STOP taking these medications     allopurinol (ZYLOPRIM) 300 MG tablet      HYDROcodone-acetaminophen (NORCO) 7.5-325 MG per tablet      hydrocortisone (ANUSOL-HC) 2.5 % rectal cream      loratadine (CLARITIN) 10 MG tablet      losartan (COZAAR) 25 MG tablet      metFORMIN (GLUCOPHAGE) 500 MG tablet      polyethylene glycol (MIRALAX  / GLYCOLAX) packet      furosemide (LASIX) 20 MG tablet      pantoprazole (PROTONIX) 40 MG tablet      warfarin (COUMADIN) 4 MG tablet        Allergies  Allergen Reactions  . Ultram [Tramadol] Shortness Of Breath      The results of significant diagnostics from this hospitalization (including imaging, microbiology, ancillary and laboratory) are listed below for reference.    Significant Diagnostic Studies: Dg Abd 1 View  12/17/2014   CLINICAL DATA:  Constipation, history of stroke  EXAM: ABDOMEN - 1 VIEW  COMPARISON:  None.  FINDINGS: Aortic calcification. No abnormally dilated loops of bowel. Stool retained throughout the large bowel.  IMPRESSION: Moderate to large stool burden   Electronically Signed   By: Skipper Cliche M.D.   On: 12/17/2014 12:41   Ct Head Wo Contrast  01/11/2015   CLINICAL DATA:  Altered mental status, unconsciousness, recent UTI.  EXAM: CT HEAD WITHOUT CONTRAST  TECHNIQUE: Contiguous axial images were obtained from the base of the skull through the vertex without intravenous contrast.  COMPARISON:  08/01/2014  FINDINGS: No mass effect or midline shift. No evidence of acute intracranial hemorrhage, or infarction. No abnormal extra-axial fluid collections. Basal cisterns are preserved. There are stable extensive changes of encephalomalacia in the left frontoparietal region, compatible with prior MCA distribution infarct. An area of hypoattenuation posterior to the area of encephalomalacia in the left parietal lobe is also stable, and may represent a more subacute component. Periventricular small vessel white matter changes are also stable. There has been a probable remote infarct in the right basal ganglia.  No depressed skull fractures. Left maxillary sinus opacification is again noted. The remainder of the paranasal sinuses and mastoid air cells are normally aerated.  IMPRESSION: Extensive but stable changes in the left frontoparietal region, likely from chronic left  MCA infarct. A more subacute appearing component posteriorly is also stable.  Probable right basal ganglia chronic infarct.  No evidence of acute intracranial hemorrhage or infarction.  Persistent opacification of the left maxillary sinus.   Electronically Signed   By: Fidela Salisbury M.D.   On: 01/11/2015 14:48   Ct Head Wo Contrast  01/01/2015   CLINICAL DATA:  Altered mental status  EXAM: CT HEAD WITHOUT CONTRAST  TECHNIQUE: Contiguous axial images were obtained from the base of the skull through the vertex without intravenous contrast.  COMPARISON:  None.  FINDINGS: No evidence of parenchymal hemorrhage or extra-axial fluid collection. No mass lesion, mass effect, or midline shift.  No CT evidence of acute infarction.  Encephalomalacic changes/hypodensity in the left frontoparietal region nodes (series 3/ images 25 and 19), compatible with prior left MCA distribution infarct.  Subcortical white matter and periventricular small vessel ischemic changes. Intracranial atherosclerosis.  Cerebral volume is within normal limits.  No ventriculomegaly.  New complete opacification of the left maxillary sinus. Mastoid air  cells are clear.  No evidence of calvarial fracture.  IMPRESSION: No evidence of acute intracranial abnormality.  Encephalomalacic changes related to old left MCA distribution infarct.  Small vessel ischemic changes.   Electronically Signed   By: Julian Hy M.D.   On: 01/01/2015 19:09   Mr Brain Wo Contrast  01/12/2015   CLINICAL DATA:  Initial evaluation for acute altered mental status.  EXAM: MRI HEAD WITHOUT CONTRAST  TECHNIQUE: Multiplanar, multiecho pulse sequences of the brain and surrounding structures were obtained without intravenous contrast.  COMPARISON:  Prior CT from earlier the same day as well as previous MRI from 02/08/2009 and CT from 11/12/2014.  FINDINGS: Study is degraded by motion artifact.  Diffuse prominence of the CSF containing spaces is compatible with  generalized cerebral atrophy. Patchy and confluent T2/FLAIR hyperintensity within the periventricular and deep white matter of both cerebral hemispheres most consistent with chronic small vessel ischemic disease. Encephalomalacia within the posterior left frontal lobe and parasagittal left frontal parietal region seen, most consistent with remote ischemic infarct. Associated gliosis and chronic hemosiderin staining present.  No abnormal foci of restricted diffusion to suggest acute intracranial infarct. Gray-white matter differentiation maintained. Normal intravascular flow voids are maintained. No acute intracranial hemorrhage.  No mass lesion, midline shift, or mass effect. No hydrocephalus. No extra-axial fluid collection.  Craniocervical junction within normal limits. Degenerative spondylolysis present within the upper cervical spine without significant stenosis.  Incidental note made of a partially empty sella. No acute abnormality about the orbits.  Chronic left maxillary sinus disease noted. Paranasal sinuses are otherwise clear. No mastoid effusion. Inner ear structures grossly normal.  Bone marrow signal intensity within normal limits. Scalp soft tissues unremarkable.  Incidental note made of enlarged bilateral cervical lymph nodes at level 2, measuring up to 17 mm on the left and 11 mm on the right, indeterminate. These may be similar relative to prior CT from 11/12/2014.  IMPRESSION: 1. No acute intracranial process identified. 2. Remote left MCA territory infarct involving the posterior left frontal and parietal lobes. 3. Generalized cerebral atrophy with mild to moderate chronic small vessel ischemic disease. 4. Enlarged bilateral level II cervical lymph nodes, incompletely evaluated on this exam, but may be similar relative to prior CT from 11/12/2014. These are of uncertain significance.   Electronically Signed   By: Jeannine Boga M.D.   On: 01/12/2015 03:12   US Venous Img Lower  Bilateral  01/07/2015   CLINICAL DATA:  Fever and bilateral lower extremity swelling  EXAM: BILATERAL LOWER EXTREMITY VENOUS DOPPLER ULTRASOUND  TECHNIQUE: Gray-scale sonography with graded compression, as well as color Doppler and duplex ultrasound were performed to evaluate the lower extremity deep venous systems from the level of the common femoral vein and including the common femoral, femoral, profunda femoral, popliteal and calf veins including the posterior tibial, peroneal and gastrocnemius veins when visible. The superficial great saphenous vein was also interrogated. Spectral Doppler was utilized to evaluate flow at rest and with distal augmentation maneuvers in the common femoral, femoral and popliteal veins.  COMPARISON:  None.  FINDINGS: RIGHT LOWER EXTREMITY  Common Femoral Vein: No evidence of thrombus. Normal compressibility, respiratory phasicity and response to augmentation.  Saphenofemoral Junction: No evidence of thrombus. Normal compressibility and flow on color Doppler imaging.  Profunda Femoral Vein: No evidence of thrombus. Normal compressibility and flow on color Doppler imaging.  Femoral Vein: No evidence of thrombus. Normal compressibility, respiratory phasicity and response to augmentation.  Popliteal Vein: No evidence of thrombus.  Normal compressibility, respiratory phasicity and response to augmentation.  Calf Veins: Not well visualized  Superficial Great Saphenous Vein: No evidence of thrombus. Normal compressibility and flow on color Doppler imaging.  Venous Reflux:  None.  Other Findings: Prominent lymph nodes are noted in in the right inguinal region.  LEFT LOWER EXTREMITY  Common Femoral Vein: No evidence of thrombus. Normal compressibility, respiratory phasicity and response to augmentation.  Saphenofemoral Junction: No evidence of thrombus. Normal compressibility and flow on color Doppler imaging.  Profunda Femoral Vein: No evidence of thrombus. Normal compressibility and flow  on color Doppler imaging.  Femoral Vein: No evidence of thrombus. Normal compressibility, respiratory phasicity and response to augmentation.  Popliteal Vein: No evidence of thrombus. Normal compressibility, respiratory phasicity and response to augmentation.  Calf Veins: Not well visualized  Superficial Great Saphenous Vein: No evidence of thrombus. Normal compressibility and flow on color Doppler imaging.  Venous Reflux:  None.  Other Findings: Prominent lymph nodes are noted in the left inguinal region  IMPRESSION: No evidence of deep venous thrombosis. Evaluation of the calf veins is limited. Mildly prominent lymph nodes are noted in the inguinal regions bilaterally. This is likely reactive in nature.   Electronically Signed   By: Inez Catalina M.D.   On: 01/07/2015 16:30   Dg Chest Port 1 View  01/11/2015   CLINICAL DATA:  Altered mental status. Patient is unable to give information.  EXAM: PORTABLE CHEST - 1 VIEW  COMPARISON:  01/06/2015  FINDINGS: Postsurgical changes are seen within the right chest wall, query prior right breast/axilla surgery.  Cardiomediastinal silhouette is normal for portable technique. Mediastinal contours appear widened, which may be due to portable technique and low lung volumes.  There is no evidence of focal airspace consolidation, pleural effusion or pneumothorax. The lung volumes are low.  Osseous structures are without acute abnormality. Soft tissues are grossly normal.  IMPRESSION: Low lung volumes.  Widened mediastinal contours, which may be partially artifactual. Attention on follow-up is recommended.   Electronically Signed   By: Fidela Salisbury M.D.   On: 01/11/2015 13:26   Dg Chest Port 1 View  01/06/2015   CLINICAL DATA:  Fever  EXAM: PORTABLE CHEST - 1 VIEW  COMPARISON:  January 04, 2015  FINDINGS: There is no edema or consolidation. Heart size and pulmonary vascularity are normal. No adenopathy. There are surgical clips in the right axillary region. No bone  lesions.  IMPRESSION: No edema or consolidation.   Electronically Signed   By: Lowella Grip III M.D.   On: 01/06/2015 11:03   Dg Chest Port 1 View  01/04/2015   CLINICAL DATA:  Shortness of breath.  Sepsis.  EXAM: PORTABLE CHEST - 1 VIEW  COMPARISON:  01/01/2015  FINDINGS: Heart size is normal. Mediastinal shadows are normal except for calcification and unfolding of the thoracic aorta. The lungs are clear. The vascularity is normal. No effusions. No acute bone finding.  IMPRESSION: No active disease.   Electronically Signed   By: Nelson Chimes M.D.   On: 01/04/2015 07:15   Dg Chest Portable 1 View  01/01/2015   CLINICAL DATA:  Altered mental status, elevated creatinine  EXAM: PORTABLE CHEST - 1 VIEW  COMPARISON:  06/10/2012  FINDINGS: Lungs are essentially clear. No focal consolidation. No pleural effusion or pneumothorax.  Cardiomegaly.  Surgical clips in the right chest wall/axilla.  IMPRESSION: No evidence of acute cardiopulmonary disease.   Electronically Signed   By: Julian Hy M.D.   On:  01/01/2015 19:25    Microbiology: Recent Results (from the past 240 hour(s))  Rapid strep screen (not at [facility])     Status: None   Collection Time: 01/06/15  3:20 PM  Result Value Ref Range Status   Streptococcus, Group A Screen (Direct) NEGATIVE NEGATIVE Final    Comment: (NOTE) A Rapid Antigen test may result negative if the antigen level in the sample is below the detection level of this test. The FDA has not cleared this test as a stand-alone test therefore the rapid antigen negative result has reflexed to a Group A Strep culture.   Culture, Group A Strep     Status: None   Collection Time: 01/06/15  3:20 PM  Result Value Ref Range Status   Strep A Culture Negative  Final    Comment: (NOTE) Performed At: [facility] [address] [place], [place] [id number] [name] MD VO:[id number]   Urine culture     Status: None   Collection Time: 01/11/15  1:41 PM  Result  Value Ref Range Status   Specimen Description URINE, CATHETERIZED  Final   Special Requests NONE  Final   Culture   Final    NO GROWTH 2 DAYS Performed at [facility]    Report Status 01/13/2015 FINAL  Final  Urine culture     Status: None   Collection Time: 01/11/15  7:00 PM  Result Value Ref Range Status   Specimen Description URINE, RANDOM  Final   Special Requests NONE  Final   Culture CANCELLED BY LAB DUPLICATE REQUEST  Final   Report Status 01/13/2015 FINAL  Final     Labs: Basic Metabolic Panel:  Recent Labs Lab 01/11/15 1310 01/13/15 0549  NA 136 140  K 3.9 3.5  CL 101 105  CO2 24 24  GLUCOSE 96 108*  BUN 38* 27*  CREATININE 1.57* 0.96  CALCIUM 11.2* 11.0*   Liver Function Tests:  Recent Labs Lab 01/11/15 1310  AST 47*  ALT 16  ALKPHOS 65  BILITOT 2.0*  PROT 6.2*  ALBUMIN 3.1*   No results for input(s): LIPASE, AMYLASE in the last 168 hours.  Recent Labs Lab 01/12/15 1145  AMMONIA 31   CBC:  Recent Labs Lab 01/09/15 0557 01/11/15 1310  WBC 6.8 7.8  NEUTROABS  --  3.4  HGB 8.6* 8.8*  HCT 25.8* 27.0*  MCV 82.4 83.1  PLT 76* 72*   Cardiac Enzymes: No results for input(s): CKTOTAL, CKMB, CKMBINDEX, TROPONINI in the last 168 hours. BNP: BNP (last 3 results) No results for input(s): BNP in the last 8760 hours.  ProBNP (last 3 results) No results for input(s): PROBNP in the last 8760 hours.  CBG:  Recent Labs Lab 01/14/15 1131 01/14/15 1624 01/14/15 2055 01/15/15 0633 01/15/15 1157  GLUCAP 96 111* 106* 93 102*       Signed:  Tirso Laws  [facility] 01/15/2015, 1:49 PM

## 2015-01-15 NOTE — Consult Note (Signed)
Consultation Note Date: 01/15/2015   Patient Name: Brittany Clarke  DOB: Sep 07, 1940  MRN: [id number]  Age / Sex: 74 y.o., female   PCP: [name], MD Referring Physician: Geradine Girt, DO  Reason for Consultation: Establishing goals of care and Psychosocial/spiritual support  Palliative Care Assessment and Plan Summary of Established Goals of Care and Medical Treatment Preferences    Palliative Care Discussion Held Today:     This NP Wadie Lessen reviewed medical records, received report from team, assessed the patient and then meet at the patient's bedside along with Thomas/son and Patricia/daughter by telephone  to discuss diagnosis, prognosis, GOC, EOL wishes disposition and options.  A detailed discussion was had today regarding advanced directives.  Concepts specific to code status, artifical feeding and hydration, continued IV antibiotics and rehospitalization was had.  The difference between a aggressive medical intervention path  and a palliative comfort care path for this patient at this time was had.  Values and goals of care important to patient and family were attempted to be elicited.  We discussed in detail the diagnosis and natural trajectroy of failure to thrive, specific to weakened swallow and poor po intake. I offered education to this family the likelihood that patient will once again naturally dehydrate once discharged from the hospital without artifical hydration.  We discussed this decline and changes as part of the dying process.  Concept of Hospice was discussed  Natural trajectory and expectations at EOL were discussed.  Questions and concerns addressed.  Hard Choices booklet left for review. Family encouraged to call with questions or concerns.  PMT will continue to support holistically, however it is likely patient will be discharged home today.   Primary Decision Maker:  Family as a whole, no documentation   Goals of Care/Code Status/Advance Care  Planning:   Code Status:  Full Code-strongly encouraged family to consider DNR understanding poor outcomes in similar pateitnts  Artificial feeding: would consider in the future-educated on the risks and benefits of TF  Antibiotics: Yes  Diagnostics:yes  Rehospitalization: yes    Psycho-social/Spiritual:   Support System: family  Desire for further Chaplaincy support: no    Discharge Planning:  Home with Brittany Clarke, daughter reports they have an aide from [facility] every day.  Family tells me that patient is never alone that family " take turns" caring for her.       Chief Complaint: altered mental status  History of Present Illness:   74 year old  Femalhas a past medical history of CHF (congestive heart failure); Diabetes mellitus without complication; COPD (chronic obstructive pulmonary disease); Hypertension; Asthma; Shortness of breath; Anxiety; Pneumonia; Gout; Cancer; Anticoagulant long-term use; echocardiogram (12/2014); Stroke (2013); Thrombocytopenia; Encephalopathy (8/22-8/26/2016 admission); and Charcot foot due to diabetes mellitus.    Brittany Clarke is a 74 y.o. female admitted to the hospital 10 days ago with altered mental status,  she was dehydrated at that time. She spent approximately 8 days in the hospital and was discharged 2 days ago to the skilled nursing facility.    Extensively worked up last admission. Here, ABG, NH3, EEG ok. Dehydrated and increased bun/creat. Neurology consulted and feels likely dementia with delirium secondary to alteration of sleep/wake cycle and change of location. Improving.    Multiple readmissions for smililar events, family faced with advanced directives and anticipatory care needs   Allergies: Ultram   Primary Diagnoses  Present on Admission:  . Acute encephalopathy . AKI (acute kidney injury) . Anemia . Charcot foot due  to diabetes mellitus . HTN (hypertension)  Palliative Review of Systems:    -unable  to illict due to altered mental status   I have reviewed the medical record, interviewed the patient and family, and examined the patient. The following aspects are pertinent.  Past Medical History  Diagnosis Date  . CHF (congestive heart failure)   . Diabetes mellitus without complication   . COPD (chronic obstructive pulmonary disease)   . Hypertension   . Asthma   . Shortness of breath   . Stroke     2013  . Anxiety   . Pneumonia   . Gout   . 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 Anticoagulant long-term use    Social History   Social History  . Marital Status: Legally Separated    Spouse Name: N/A  . Number of Children: 5  . Years of Education: N/A   Social History Main Topics  . Smoking status: Never Smoker   . Smokeless tobacco: Never Used  . Alcohol Use: No  . Drug Use: No  . Sexual Activity: No   Other Topics Concern  . None   Social History Narrative   Family History  Problem Relation Age of Onset  . Colon cancer Neg Hx   . Liver disease Neg Hx   . Heart disease Mother   . Hypertension Mother   . Cancer Father     lung  . Cancer Brother     prostate  . Diabetes Brother   . Diabetes Sister   . Cancer Sister     breast cancer  . Hypertension Sister   . Diabetes Sister   . Hypertension Sister   . Diabetes Sister   . Hypertension Sister   . Hypertension Sister   . Cancer Brother   . Diabetes Brother   . Diabetes Brother    Scheduled Meds: . feeding supplement (ENSURE ENLIVE)  237 mL Oral BID BM  . gabapentin  100 mg Oral QHS  . insulin aspart  0-20 Units Subcutaneous TID WC  . insulin aspart  0-5 Units Subcutaneous QHS  . metoprolol tartrate  12.5 mg Oral BID  . montelukast  10 mg Oral QPM  . Warfarin - Pharmacist Dosing Inpatient   Does not apply Q24H   Continuous Infusions: . sodium chloride 100 mL/hr at 01/12/15 2159   PRN Meds:.acetaminophen, ondansetron **OR** ondansetron (ZOFRAN) IV, RESOURCE THICKENUP  CLEAR, senna-docusate Medications Prior to Admission:  Prior to Admission medications   Medication Sig Start Date End Date Taking? Authorizing Provider  allopurinol (ZYLOPRIM) 300 MG tablet TAKE ONE (1) TABLET EACH DAY 10/08/14  Yes [name], MD  atorvastatin (LIPITOR) 40 MG tablet TAKE ONE (1) TABLET EACH DAY 11/30/14  Yes [name], MD  gabapentin (NEURONTIN) 100 MG capsule Take 1 capsule (100 mg total) by mouth 2 (two) times daily. 01/09/15  Yes Rexene Alberts, MD  HYDROcodone-acetaminophen ([place]) 7.5-325 MG per tablet Take one tablet by mouth twice daily as needed for pain. Do not exceed 3gm of APAP/24 form all sources. 01/09/15  Yes Rexene Alberts, MD  hydrocortisone (ANUSOL-HC) 2.5 % rectal cream Place 1 application rectally 2 (two) times daily. 01/09/15  Yes Rexene Alberts, MD  loratadine (CLARITIN) 10 MG tablet TAKE ONE (1) TABLET EACH DAY 07/06/14  Yes [name], MD  losartan (COZAAR) 25 MG tablet TAKE ONE (1) TABLET EACH DAY 11/30/14  Yes [name], MD  metFORMIN (  GLUCOPHAGE) 500 MG tablet TAKE ONE TABLET TWICE A DAY WITH FOOD 11/30/14  Yes [name], MD  metoprolol tartrate (LOPRESSOR) 25 MG tablet TAKE 1/2 TABLET TWICE DAILY 11/30/14  Yes [name], MD  montelukast (SINGULAIR) 10 MG tablet TAKE 1 TABLET DAILY IN THE EVENING 11/30/14  Yes [name], MD  polyethylene glycol [facility] / GLYCOLAX) packet Take 17 g by mouth daily. 01/09/15  Yes Rexene Alberts, MD  furosemide (LASIX) 20 MG tablet Take 1 tablet (20 mg total) by mouth every other day. For fluid buildup Patient not taking: Reported on 01/11/2015 01/09/15   Rexene Alberts, MD  pantoprazole (PROTONIX) 40 MG tablet Take 1 tablet (40 mg total) by mouth daily. Patient not taking: Reported on 01/11/2015 01/09/15   Rexene Alberts, MD  warfarin (COUMADIN) 4 MG tablet DO NOT RESTART THIS MEDICATION UNTIL YOUR DOCTOR CHECKS YOUR BLOOD TEST (INR). 01/09/15   Rexene Alberts, MD   Allergies  Allergen Reactions    . Ultram [Tramadol] Shortness Of Breath   CBC:    Component Value Date/Time   WBC 7.8 01/11/2015 1310   WBC 5.0 12/11/2014 0902   WBC 6.9 09/04/2013 1515   HGB 8.8* 01/11/2015 1310   HGB 11.0* 09/04/2013 1515   HCT 27.0* 01/11/2015 1310   HCT 32.3* 12/11/2014 0902   HCT 35.9* 09/04/2013 1515   PLT 72* 01/11/2015 1310   MCV 83.1 01/11/2015 1310   MCV 87.9 09/04/2013 1515   NEUTROABS 3.4 01/11/2015 1310   NEUTROABS 1.8 12/11/2014 0902   LYMPHSABS 2.2 01/11/2015 1310   LYMPHSABS 1.8 12/11/2014 0902   MONOABS 2.1* 01/11/2015 1310   EOSABS 0.1 01/11/2015 1310   BASOSABS 0.0 01/11/2015 1310   BASOSABS 0.0 12/11/2014 0902   Comprehensive Metabolic Panel:    Component Value Date/Time   NA 140 01/13/2015 0549   NA 143 06/17/2014 1342   K 3.5 01/13/2015 0549   CL 105 01/13/2015 0549   CO2 24 01/13/2015 0549   BUN 27* 01/13/2015 0549   BUN 12 06/17/2014 1342   CREATININE 0.96 01/13/2015 0549   CREATININE 0.47* 10/15/2012 1347   GLUCOSE 108* 01/13/2015 0549   GLUCOSE 106* 06/17/2014 1342   CALCIUM 11.0* 01/13/2015 0549   AST 47* 01/11/2015 1310   ALT 16 01/11/2015 1310   ALKPHOS 65 01/11/2015 1310   BILITOT 2.0* 01/11/2015 1310   PROT 6.2* 01/11/2015 1310   PROT 6.6 06/17/2014 1342   ALBUMIN 3.1* 01/11/2015 1310    Physical Exam:  Vital Signs: BP 109/57 mmHg  Pulse 97  Temp(Src) 99 F (37.2 C) (Oral)  Resp 20  Ht 5\' 6"  (1.676 m)  Wt 98.521 kg (217 lb 3.2 oz)  BMI 35.07 kg/m2  SpO2 96% SpO2: SpO2: 96 % O2 Device: O2 Device: Not Delivered O2 Flow Rate: O2 Flow Rate (L/min): 2 L/min Intake/output summary: No intake or output data in the 24 hours ending 01/15/15 1248 LBM: Last BM Date: 01/14/15 Baseline Weight: Weight: 98.521 kg (217 lb 3.2 oz) Most recent weight: Weight: 98.521 kg (217 lb 3.2 oz)  Exam Findings:   General: Chronically ill appearing, lethargic,  HEENT: moist buccal membranes, no exudate CVS: RRR Resp: decreased in bases, CTA Abd: soft NT  +BS Skin: warm and dry Neuro: confused, follows only simple commands           Palliative Performance Scale: 30 % at best                Additional Data Reviewed:  Recent Labs     01/13/15  0549  NA  140  BUN  27*  CREATININE  0.96     Time In: 1230 Time Out: 1345 Time Total: 75 min  Greater than 50%  of this time was spent counseling and coordinating care related to the above assessment and plan.  Discussed with  Dr  Eliseo Squires  Signed by: Wadie Lessen, NP  Knox Royalty, NP  01/15/2015, 12:48 PM  Please contact Palliative Medicine Team phone at [phone] for questions and concerns.   See AMION for contact information

## 2015-01-15 NOTE — Progress Notes (Signed)
ANTICOAGULATION CONSULT NOTE - Follow Up Consult  Pharmacy Consult for Coumadin Indication: h/o CVA  Allergies  Allergen Reactions  . Ultram [Tramadol] Shortness Of Breath    Patient Measurements: Height: 5\' 6"  (167.6 cm) Weight: 217 lb 3.2 oz (98.521 kg) IBW/kg (Calculated) : 59.3  Vital Signs: Temp: 100.3 F (37.9 C) (08/26 1344) Temp Source: Oral (08/26 1344) BP: 112/52 mmHg (08/26 1344) Pulse Rate: 96 (08/26 1344)  Labs:  Recent Labs  01/13/15 0549 01/14/15 0544 01/15/15 0500  LABPROT 30.3* 42.7* 39.3*  INR 2.96* 4.67* 4.18*  CREATININE 0.96  --   --     Estimated Creatinine Clearance: 61.8 mL/min (by C-G formula based on Cr of 0.96).  Assessment: 74yo female readmitted with decreased responsiveness [facility] to [facility] for further evaluation.  Recently hospitalized at [facility] (8/12 - 8/20)  Anticoagulation: Coumadin PTA for hx of stroke. PTA dose unknown due to elevated INR 7.12 as far back as 12/28/14. INR has continued to remain elevated despite holding Coumadin since 01/01/2015.  INR today remains supratherapeutic at 4.18, H&H trend down, PLT relatively stable, no bleeding reported.  Goal of Therapy:  INR 2-3 Monitor platelets by anticoagulation protocol: Yes   Plan:  Continue to hold coumadin Monitor daily INR, CBC, s/sx of bleeding while inpatient Anticipate D/C today or tomorrow > Rec f/u in Coumadin clinic 8/29  Drucie Opitz, PharmD Clinical Pharmacist Pager: [phone] 01/15/2015 2:15 PM

## 2015-01-15 NOTE — Clinical Social Work Note (Signed)
CSW spoke with the pt's daughter Benjamine Mola regarding SNF placement. Benjamine Mola denied SNF placement. Benjamine Mola reported that she will take the pt's home and resume home health services. CSW informed the case manager and MD. At this time CSW will sign off.   [place], MSW, [place]

## 2015-01-15 NOTE — Clinical Social Work Note (Signed)
Clinical Social Worker arranged ambulance transport via [facility] to patient's home address. Address confirmed by patient's family.   Clinical Social Worker will sign off for now as social work intervention is no longer needed. Please consult [place] again if new need arises.  Glendon Axe, MSW, LCSWA [phone] 01/15/2015 4:45 PM

## 2015-01-15 NOTE — Progress Notes (Signed)
Speech Language Pathology Treatment: Dysphagia  Patient Details Name: Brittany Clarke MRN: [id number] DOB: August 29, 1940 Today's Date: 01/15/2015 Time: 1000-1025 SLP Time Calculation (min) (ACUTE ONLY): 25 min  Assessment / Plan / Recommendation Clinical Impression  Patient continues to present with significant oral dysphagia which resulted in prolonged bolus transit in oral cavity, delayed initiation of swallow (required moderate-maximal frequency of verbal cues to initiate swallows with purees). Patient appears with cognitive impact of oral dysphagia, with loss of attention to bolus. She initiated swallow with minimal delays when using straw sips of thin liquids, with SLP controlling amount by taking cup away after she had one sip. No overt s/s aspiration or difficulty with P.O. Intake of thin liquids and feel that patient is appropriate to be upgraded from nectar thick liquids to thin liquids. Patient is still not ready to have puree solids added to meal, and will need to continue to trial this with SLP, secondary to mod-severe oral dysphagia and delays in transit and swallow initiation of purees.   HPI Other Pertinent Information: 74 year old female readmitted 01/11/15 due to AMS, decreased renal function, hypotension. Pt was just recently admitted due to AMS. PMH significant for multiple co-morbidities, including CHF, DM, COPD, HTN, SOB, CVA (2013), PNA (2014), breast CA.   Pt had BSE yesterday and was started on full liquid - nectar consistency.  SLP follow up re: dysphagia today to determine tolerance of intake and readiness for dietary advancemet.     Pertinent Vitals Pain Assessment: No/denies pain  SLP Plan  Continue with current plan of care    Recommendations Diet recommendations: Thin liquid Liquids provided via: Cup;Straw Medication Administration: Crushed with puree Supervision: Full supervision/cueing for compensatory strategies;Staff to assist with self feeding Compensations: Minimize  environmental distractions;Slow rate;Small sips/bites;Check for pocketing Postural Changes and/or Swallow Maneuvers: Upright 30-60 min after meal;Seated upright 90 degrees              Oral Care Recommendations: Oral care before and after PO Follow up Recommendations: Skilled Nursing facility Plan: Continue with current plan of care        Sonia Baller, MA, CCC-SLP 01/15/2015 3:37 PM

## 2015-01-18 ENCOUNTER — Emergency Department (HOSPITAL_COMMUNITY): Payer: Medicare Other

## 2015-01-18 ENCOUNTER — Encounter (HOSPITAL_COMMUNITY): Payer: Self-pay | Admitting: Emergency Medicine

## 2015-01-18 ENCOUNTER — Telehealth: Payer: Self-pay | Admitting: Family Medicine

## 2015-01-18 ENCOUNTER — Inpatient Hospital Stay (HOSPITAL_COMMUNITY)
Admission: EM | Admit: 2015-01-18 | Discharge: 2015-01-22 | DRG: 811 | Disposition: A | Discharge status: Hospice | Payer: Medicare Other | Attending: Family Medicine | Admitting: Family Medicine

## 2015-01-18 DIAGNOSIS — A419 Sepsis, unspecified organism: Secondary | ICD-10-CM

## 2015-01-18 DIAGNOSIS — R109 Unspecified abdominal pain: Secondary | ICD-10-CM

## 2015-01-18 DIAGNOSIS — Z515 Encounter for palliative care: Secondary | ICD-10-CM

## 2015-01-18 DIAGNOSIS — F05 Delirium due to known physiological condition: Secondary | ICD-10-CM | POA: Diagnosis present

## 2015-01-18 DIAGNOSIS — G9340 Encephalopathy, unspecified: Secondary | ICD-10-CM | POA: Diagnosis not present

## 2015-01-18 DIAGNOSIS — D469 Myelodysplastic syndrome, unspecified: Secondary | ICD-10-CM | POA: Diagnosis not present

## 2015-01-18 DIAGNOSIS — F0390 Unspecified dementia without behavioral disturbance: Secondary | ICD-10-CM | POA: Diagnosis present

## 2015-01-18 DIAGNOSIS — Z7189 Other specified counseling: Secondary | ICD-10-CM | POA: Insufficient documentation

## 2015-01-18 DIAGNOSIS — D6832 Hemorrhagic disorder due to extrinsic circulating anticoagulants: Secondary | ICD-10-CM | POA: Diagnosis present

## 2015-01-18 DIAGNOSIS — K649 Unspecified hemorrhoids: Secondary | ICD-10-CM | POA: Diagnosis present

## 2015-01-18 DIAGNOSIS — D649 Anemia, unspecified: Secondary | ICD-10-CM | POA: Diagnosis not present

## 2015-01-18 DIAGNOSIS — I10 Essential (primary) hypertension: Secondary | ICD-10-CM | POA: Diagnosis not present

## 2015-01-18 DIAGNOSIS — J449 Chronic obstructive pulmonary disease, unspecified: Secondary | ICD-10-CM | POA: Diagnosis not present

## 2015-01-18 DIAGNOSIS — Z853 Personal history of malignant neoplasm of breast: Secondary | ICD-10-CM

## 2015-01-18 DIAGNOSIS — T45515A Adverse effect of anticoagulants, initial encounter: Secondary | ICD-10-CM | POA: Diagnosis present

## 2015-01-18 DIAGNOSIS — R509 Fever, unspecified: Secondary | ICD-10-CM

## 2015-01-18 DIAGNOSIS — F419 Anxiety disorder, unspecified: Secondary | ICD-10-CM | POA: Diagnosis not present

## 2015-01-18 DIAGNOSIS — E119 Type 2 diabetes mellitus without complications: Secondary | ICD-10-CM | POA: Diagnosis not present

## 2015-01-18 DIAGNOSIS — I69351 Hemiplegia and hemiparesis following cerebral infarction affecting right dominant side: Secondary | ICD-10-CM | POA: Diagnosis not present

## 2015-01-18 DIAGNOSIS — Z66 Do not resuscitate: Secondary | ICD-10-CM | POA: Diagnosis not present

## 2015-01-18 DIAGNOSIS — E785 Hyperlipidemia, unspecified: Secondary | ICD-10-CM | POA: Diagnosis not present

## 2015-01-18 DIAGNOSIS — M14679 Charcot's joint, unspecified ankle and foot: Secondary | ICD-10-CM | POA: Diagnosis not present

## 2015-01-18 DIAGNOSIS — K760 Fatty (change of) liver, not elsewhere classified: Secondary | ICD-10-CM | POA: Diagnosis not present

## 2015-01-18 DIAGNOSIS — J45909 Unspecified asthma, uncomplicated: Secondary | ICD-10-CM | POA: Diagnosis present

## 2015-01-18 DIAGNOSIS — E11610 Type 2 diabetes mellitus with diabetic neuropathic arthropathy: Secondary | ICD-10-CM | POA: Diagnosis not present

## 2015-01-18 DIAGNOSIS — F329 Major depressive disorder, single episode, unspecified: Secondary | ICD-10-CM | POA: Diagnosis present

## 2015-01-18 DIAGNOSIS — R627 Adult failure to thrive: Secondary | ICD-10-CM | POA: Diagnosis not present

## 2015-01-18 DIAGNOSIS — R791 Abnormal coagulation profile: Secondary | ICD-10-CM | POA: Diagnosis present

## 2015-01-18 DIAGNOSIS — M109 Gout, unspecified: Secondary | ICD-10-CM | POA: Diagnosis present

## 2015-01-18 DIAGNOSIS — D696 Thrombocytopenia, unspecified: Secondary | ICD-10-CM | POA: Diagnosis present

## 2015-01-18 DIAGNOSIS — R7989 Other specified abnormal findings of blood chemistry: Secondary | ICD-10-CM | POA: Diagnosis not present

## 2015-01-18 DIAGNOSIS — R1310 Dysphagia, unspecified: Secondary | ICD-10-CM

## 2015-01-18 DIAGNOSIS — Z7901 Long term (current) use of anticoagulants: Secondary | ICD-10-CM | POA: Diagnosis not present

## 2015-01-18 DIAGNOSIS — Z888 Allergy status to other drugs, medicaments and biological substances status: Secondary | ICD-10-CM | POA: Diagnosis not present

## 2015-01-18 DIAGNOSIS — R4701 Aphasia: Secondary | ICD-10-CM | POA: Diagnosis not present

## 2015-01-18 DIAGNOSIS — J9811 Atelectasis: Secondary | ICD-10-CM | POA: Diagnosis not present

## 2015-01-18 DIAGNOSIS — R945 Abnormal results of liver function studies: Secondary | ICD-10-CM

## 2015-01-18 DIAGNOSIS — Z79899 Other long term (current) drug therapy: Secondary | ICD-10-CM | POA: Diagnosis not present

## 2015-01-18 DIAGNOSIS — K8020 Calculus of gallbladder without cholecystitis without obstruction: Secondary | ICD-10-CM | POA: Diagnosis not present

## 2015-01-18 DIAGNOSIS — I509 Heart failure, unspecified: Secondary | ICD-10-CM | POA: Diagnosis not present

## 2015-01-18 DIAGNOSIS — E860 Dehydration: Secondary | ICD-10-CM | POA: Diagnosis not present

## 2015-01-18 DIAGNOSIS — R0602 Shortness of breath: Secondary | ICD-10-CM | POA: Diagnosis not present

## 2015-01-18 DIAGNOSIS — D699 Hemorrhagic condition, unspecified: Secondary | ICD-10-CM | POA: Diagnosis not present

## 2015-01-18 DIAGNOSIS — Z8673 Personal history of transient ischemic attack (TIA), and cerebral infarction without residual deficits: Secondary | ICD-10-CM | POA: Diagnosis not present

## 2015-01-18 HISTORY — DX: Personal history of other medical treatment: Z92.89

## 2015-01-18 HISTORY — DX: Type 2 diabetes mellitus with diabetic neuropathic arthropathy: E11.610

## 2015-01-18 HISTORY — DX: Encephalopathy, unspecified: G93.40

## 2015-01-18 HISTORY — DX: Thrombocytopenia, unspecified: D69.6

## 2015-01-18 LAB — CBC WITH DIFFERENTIAL/PLATELET
BASOS ABS: 0 10*3/uL (ref 0.0–0.1)
BASOS PCT: 1 % (ref 0–1)
BASOS PCT: 0 % (ref 0–1)
Basophils Absolute: 0 10*3/uL (ref 0.0–0.1)
EOS ABS: 0.1 10*3/uL (ref 0.0–0.7)
Eosinophils Absolute: 0 10*3/uL (ref 0.0–0.7)
Eosinophils Relative: 1 % (ref 0–5)
Eosinophils Relative: 1 % (ref 0–5)
HCT: 11.5 % — ABNORMAL LOW (ref 36.0–46.0)
HEMATOCRIT: 22.4 % — AB (ref 36.0–46.0)
HEMOGLOBIN: 3.5 g/dL — AB (ref 12.0–15.0)
HEMOGLOBIN: 7.1 g/dL — AB (ref 12.0–15.0)
LYMPHS ABS: 2.1 10*3/uL (ref 0.7–4.0)
LYMPHS PCT: 24 % (ref 12–46)
Lymphocytes Relative: 26 % (ref 12–46)
Lymphs Abs: 1 10*3/uL (ref 0.7–4.0)
MCH: 26.3 pg (ref 26.0–34.0)
MCH: 27.3 pg (ref 26.0–34.0)
MCHC: 30.4 g/dL (ref 30.0–36.0)
MCHC: 31.7 g/dL (ref 30.0–36.0)
MCV: 86.5 fL (ref 78.0–100.0)
MCV: 86.2 fL (ref 78.0–100.0)
MONO ABS: 1.9 10*3/uL — AB (ref 0.1–1.0)
MONOS PCT: 23 % — AB (ref 3–12)
Monocytes Absolute: 1.1 10*3/uL — ABNORMAL HIGH (ref 0.1–1.0)
Monocytes Relative: 25 % — ABNORMAL HIGH (ref 3–12)
NEUTROS ABS: 2.1 10*3/uL (ref 1.7–7.7)
NEUTROS PCT: 49 % (ref 43–77)
Neutro Abs: 4.1 10*3/uL (ref 1.7–7.7)
Neutrophils Relative %: 50 % (ref 43–77)
PLATELETS: 20 10*3/uL — AB (ref 150–400)
Platelets: 40 10*3/uL — ABNORMAL LOW (ref 150–400)
RBC: 1.33 MIL/uL — AB (ref 3.87–5.11)
RBC: 2.6 MIL/uL — ABNORMAL LOW (ref 3.87–5.11)
RDW: 23.7 % — ABNORMAL HIGH (ref 11.5–15.5)
RDW: 23.7 % — AB (ref 11.5–15.5)
Smear Review: DECREASED
WBC: 4.3 10*3/uL (ref 4.0–10.5)
WBC: 8.3 10*3/uL (ref 4.0–10.5)

## 2015-01-18 LAB — URINALYSIS, ROUTINE W REFLEX MICROSCOPIC
GLUCOSE, UA: NEGATIVE mg/dL
Hgb urine dipstick: NEGATIVE
KETONES UR: 15 mg/dL — AB
LEUKOCYTES UA: NEGATIVE
Nitrite: NEGATIVE
PROTEIN: 30 mg/dL — AB
Specific Gravity, Urine: >1.03 — ABNORMAL HIGH (ref 1.005–1.030)
UROBILINOGEN UA: 1 mg/dL (ref 0.0–1.0)
pH: 5.5 (ref 5.0–8.0)

## 2015-01-18 LAB — GLUCOSE, CAPILLARY: Glucose-Capillary: 107 mg/dL — ABNORMAL HIGH (ref 65–99)

## 2015-01-18 LAB — PROTIME-INR
INR: >10 (ref 0.00–1.49)
INR: 5.65 (ref 0.00–1.49)
PROTHROMBIN TIME: 49 s — AB (ref 11.6–15.2)

## 2015-01-18 LAB — COMPREHENSIVE METABOLIC PANEL
ALBUMIN: 2.4 g/dL — AB (ref 3.5–5.0)
ALK PHOS: 58 U/L (ref 38–126)
ALT: 13 U/L — ABNORMAL LOW (ref 14–54)
AST: 44 U/L — AB (ref 15–41)
Anion gap: 4 — ABNORMAL LOW (ref 5–15)
BILIRUBIN TOTAL: 2.4 mg/dL — AB (ref 0.3–1.2)
BUN: 20 mg/dL (ref 6–20)
CALCIUM: 10.5 mg/dL — AB (ref 8.9–10.3)
CO2: 25 mmol/L (ref 22–32)
CREATININE: 0.6 mg/dL (ref 0.44–1.00)
Chloride: 113 mmol/L — ABNORMAL HIGH (ref 101–111)
GFR calc Af Amer: >60 mL/min (ref >60)
GLUCOSE: 121 mg/dL — AB (ref 65–99)
POTASSIUM: 3.4 mmol/L — AB (ref 3.5–5.1)
Sodium: 142 mmol/L (ref 135–145)
TOTAL PROTEIN: 4.8 g/dL — AB (ref 6.5–8.1)

## 2015-01-18 LAB — I-STAT CHEM 8, ED
BUN: 18 mg/dL (ref 6–20)
CALCIUM ION: 1.66 mmol/L — AB (ref 1.13–1.30)
CREATININE: 0.6 mg/dL (ref 0.44–1.00)
Chloride: 109 mmol/L (ref 101–111)
GLUCOSE: 119 mg/dL — AB (ref 65–99)
HCT: 23 % — ABNORMAL LOW (ref 36.0–46.0)
HEMOGLOBIN: 7.8 g/dL — AB (ref 12.0–15.0)
Potassium: 3.4 mmol/L — ABNORMAL LOW (ref 3.5–5.1)
Sodium: 143 mmol/L (ref 135–145)
TCO2: 23 mmol/L (ref 0–100)

## 2015-01-18 LAB — I-STAT CG4 LACTIC ACID, ED
LACTIC ACID, VENOUS: 2.13 mmol/L — AB (ref 0.5–2.0)
Lactic Acid, Venous: 1.6 mmol/L (ref 0.5–2.0)

## 2015-01-18 LAB — URINE MICROSCOPIC-ADD ON

## 2015-01-18 LAB — SAMPLE TO BLOOD BANK

## 2015-01-18 LAB — I-STAT TROPONIN, ED: TROPONIN I, POC: 0 ng/mL (ref 0.00–0.08)

## 2015-01-18 MED ORDER — METOPROLOL TARTRATE 25 MG PO TABS: 12.5000 mg | ORAL_TABLET | Freq: Two times a day (BID) | ORAL | Status: DC

## 2015-01-18 MED ORDER — ONDANSETRON HCL 4 MG/2ML IJ SOLN: 4.0000 mg | Freq: Four times a day (QID) | INTRAMUSCULAR | Status: DC | PRN

## 2015-01-18 MED ORDER — ACETAMINOPHEN 650 MG RE SUPP
650.0000 mg | Freq: Four times a day (QID) | RECTAL | Status: DC | PRN
  Administered 2015-01-19 – 2015-01-22 (×5): 650 mg via RECTAL
  Filled 2015-01-18 (×6): qty 1

## 2015-01-18 MED ORDER — VANCOMYCIN HCL IN DEXTROSE 1-5 GM/200ML-% IV SOLN
INTRAVENOUS | Status: AC
  Filled 2015-01-18: qty 200

## 2015-01-18 MED ORDER — INSULIN ASPART 100 UNIT/ML ~~LOC~~ SOLN
0.0000 [IU] | Freq: Three times a day (TID) | SUBCUTANEOUS | Status: DC
  Administered 2015-01-19: 1 [IU] via SUBCUTANEOUS

## 2015-01-18 MED ORDER — PIPERACILLIN-TAZOBACTAM 3.375 G IVPB 30 MIN
3.3750 g | Freq: Once | INTRAVENOUS | Status: AC
Start: 2015-01-18 — End: 2015-01-18
  Administered 2015-01-18: 3.375 g via INTRAVENOUS
  Filled 2015-01-18: qty 50

## 2015-01-18 MED ORDER — VANCOMYCIN HCL IN DEXTROSE 1-5 GM/200ML-% IV SOLN
1000.0000 mg | Freq: Once | INTRAVENOUS | Status: AC
  Administered 2015-01-18: 1000 mg via INTRAVENOUS
  Filled 2015-01-18: qty 200

## 2015-01-18 MED ORDER — MONTELUKAST SODIUM 10 MG PO TABS: 10.0000 mg | ORAL_TABLET | Freq: Every evening | ORAL | Status: DC

## 2015-01-18 MED ORDER — PIPERACILLIN-TAZOBACTAM 3.375 G IVPB
INTRAVENOUS | Status: AC
  Filled 2015-01-18: qty 50

## 2015-01-18 MED ORDER — SODIUM CHLORIDE 0.9 % IV BOLUS (SEPSIS)
1000.0000 mL | INTRAVENOUS | Status: AC
  Administered 2015-01-18 (×3): 1000 mL via INTRAVENOUS

## 2015-01-18 MED ORDER — POTASSIUM CHLORIDE 20 MEQ/15ML (10%) PO SOLN: 40.0000 meq | Freq: Once | ORAL | Status: DC

## 2015-01-18 MED ORDER — VANCOMYCIN HCL IN DEXTROSE 1-5 GM/200ML-% IV SOLN
1000.0000 mg | Freq: Once | INTRAVENOUS | Status: AC
  Administered 2015-01-19: 1000 mg via INTRAVENOUS

## 2015-01-18 MED ORDER — PIPERACILLIN-TAZOBACTAM 3.375 G IVPB
3.3750 g | Freq: Three times a day (TID) | INTRAVENOUS | Status: DC
  Administered 2015-01-19: 3.375 g via INTRAVENOUS
  Filled 2015-01-18 (×5): qty 50

## 2015-01-18 MED ORDER — ACETAMINOPHEN 650 MG RE SUPP
650.0000 mg | Freq: Once | RECTAL | Status: AC
  Administered 2015-01-18: 650 mg via RECTAL
  Filled 2015-01-18: qty 1

## 2015-01-18 MED ORDER — DEXTROSE-NACL 5-0.9 % IV SOLN
INTRAVENOUS | Status: DC
  Administered 2015-01-18: 23:00:00 via INTRAVENOUS
  Filled 2015-01-18 (×2): qty 1000

## 2015-01-18 MED ORDER — ACETAMINOPHEN 325 MG PO TABS: 650.0000 mg | ORAL_TABLET | Freq: Four times a day (QID) | ORAL | Status: DC | PRN

## 2015-01-18 MED ORDER — ONDANSETRON HCL 4 MG PO TABS: 4.0000 mg | ORAL_TABLET | Freq: Four times a day (QID) | ORAL | Status: DC | PRN

## 2015-01-18 MED ORDER — GABAPENTIN 100 MG PO CAPS: 100.0000 mg | ORAL_CAPSULE | Freq: Every day | ORAL | Status: DC

## 2015-01-18 MED ORDER — VANCOMYCIN HCL IN DEXTROSE 1-5 GM/200ML-% IV SOLN
1000.0000 mg | Freq: Two times a day (BID) | INTRAVENOUS | Status: DC
  Filled 2015-01-18 (×3): qty 200

## 2015-01-18 NOTE — Progress Notes (Signed)
ANTICOAGULATION CONSULT NOTE - Initial Consult  Pharmacy Consult for Warfarin Indication: stroke  Allergies  Allergen Reactions  . Ultram [Tramadol] Shortness Of Breath    Patient Measurements: Height: 5\' 6"  (167.6 cm) Weight: 250 lb (113.399 kg) IBW/kg (Calculated) : 59.3   Vital Signs: Temp: 99.6 F (37.6 C) (08/29 2130) Temp Source: Rectal (08/29 1724) BP: 114/69 mmHg (08/29 2130) Pulse Rate: 102 (08/29 2130)  Labs:  Recent Labs  01/18/15 1750 01/18/15 1945 01/18/15 1949  HGB 3.5* 7.1* 7.8*  HCT 11.5* 22.4* 23.0*  PLT 20* 40*  --   LABPROT >90.0* 49.0*  --   INR >10.00* 5.65*  --   CREATININE  --  0.60 0.60    Estimated Creatinine Clearance: 80 mL/min (by C-G formula based on Cr of 0.6).   Medical History: Past Medical History  Diagnosis Date  . CHF (congestive heart failure)   . Diabetes mellitus without complication   . COPD (chronic obstructive pulmonary disease)   . Hypertension   . Asthma   . Shortness of breath   . Anxiety   . Pneumonia   . Gout   . 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 Anticoagulant long-term use   . Hx of echocardiogram 12/2014    EF 65-70%  . 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 Thrombocytopenia   . 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 Charcot foot due to diabetes mellitus     Medications:   (Not in a hospital admission) Home meds reviewed, warfarin currently on Hold.    Assessment: Okay for Protocol , INR elevated, currently on "hold".   Goal of Therapy:  INR 2-3    Plan:  No warfarin today  F/U labs F/U [facility] plan  [name] 01/18/2015,9:59 PM

## 2015-01-18 NOTE — ED Provider Notes (Signed)
CSN: [id number]     Arrival date & time 01/18/15  1714 History   First MD Initiated Contact with Patient 01/18/15 1730     Chief Complaint  Patient presents with  . Fever     Patient is a 74 y.o. female presenting with fever. The history is provided by the EMS personnel and a caregiver. The history is limited by the condition of the patient (Hx dementia).  Fever Pt was seen at 1730. Per EMS and Home Health RN: Pt sent to the ED for fever "101.6" at home today. Also states pt "hasn't eating or drank anything" in the past 2 days. Pt was d/c from the hospital 3 days ago for dx dehydration. 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   Past Medical History  Diagnosis Date  . CHF (congestive heart failure)   . Diabetes mellitus without complication   . COPD (chronic obstructive pulmonary disease)   . Hypertension   . Asthma   . Shortness of breath   . Anxiety   . Pneumonia   . Gout   . 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 Anticoagulant long-term use   . Hx of echocardiogram 12/2014    EF 65-70%  . 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 Thrombocytopenia   . 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 Charcot foot due to diabetes mellitus    Past Surgical History  Procedure Laterality Date  . Tubal ligation    . Colonoscopy  04/04/2002    VZC:HYIFOYDX hemorrhoids, otherwise, normal rectum/Scattered left sided diverticula/Somewhat adenomatous appearing ileocecal valve which is probably normal but this area was biopsied. The remainder of the colonic mucosa appeared   normal  . Trigger finger surgery      twice  . Colonoscopy with esophagogastroduodenoscopy (egd) N/A 02/20/2013    Procedure: COLONOSCOPY WITH ESOPHAGOGASTRODUODENOSCOPY (EGD);  Surgeon: Daneil Dolin, MD;  Location: [facility];  Service: Endoscopy;  Laterality: N/A;  . Breast surgery Left 2005    lumpectomy and removed lymph nodes    Family History  Problem Relation Age of Onset  . Colon cancer Neg Hx   . Liver disease Neg Hx   . Heart disease Mother   . Hypertension Mother   . Cancer Father     lung  . Cancer Brother     prostate  . Diabetes Brother   . Diabetes Sister   . Cancer Sister     breast cancer  . Hypertension Sister   . Diabetes Sister   . Hypertension Sister   . Diabetes Sister   . Hypertension Sister   . Hypertension Sister   . Cancer Brother   . Diabetes Brother   . Diabetes Brother    Social History  Substance Use Topics  . Smoking status: Never Smoker   . Smokeless tobacco: Never Used  . Alcohol Use: No    Review of Systems  Unable to perform ROS: Dementia  Constitutional: Positive for fever.      Allergies  Ultram  Home Medications   Prior to Admission medications   Medication Sig Start Date End Date Taking? Authorizing Provider  atorvastatin (LIPITOR) 40 MG tablet TAKE ONE (1) TABLET EACH DAY 11/30/14   [name], MD  feeding supplement, ENSURE ENLIVE, (ENSURE ENLIVE) LIQD Take 237 mLs by mouth 2 (two) times daily between meals. 01/15/15  Geradine Girt, DO  gabapentin (NEURONTIN) 100 MG capsule Take 1 capsule (100 mg total) by mouth at bedtime. 01/15/15   Geradine Girt, DO  metoprolol tartrate (LOPRESSOR) 25 MG tablet TAKE 1/2 TABLET TWICE DAILY 11/30/14   [name], MD  montelukast (SINGULAIR) 10 MG tablet TAKE 1 TABLET DAILY IN THE EVENING 11/30/14   [name], MD   BP 135/77 mmHg  Pulse 112  Temp(Src) 99.9 F (37.7 C) (Rectal)  Resp 32  Wt 250 lb (113.399 kg)  SpO2 97%   Patient Vitals for the past 24 hrs:  BP Temp Temp src Pulse Resp SpO2 Weight  01/18/15 2045 115/70 mmHg 99.6 F (37.6 C) - 100 (!) 32 100 % -  01/18/15 2030 - 99.6 F (37.6 C) - 102 26 98 % -  01/18/15 2015 124/67 mmHg 99.8 F (37.7 C) - 96 (!) 31 99 % -  01/18/15 2000 129/72 mmHg 99.6 F (37.6 C) - 101 (!) 31 97 % -  01/18/15 1945 120/60 mmHg 99.9 F (37.7 C) - -  (!) 33 - -  01/18/15 1930 111/63 mmHg 99.9 F (37.7 C) - - 22 - -  01/18/15 1915 121/61 mmHg 100 F (37.8 C) - 106 21 99 % -  01/18/15 1900 115/80 mmHg 100.1 F (37.8 C) - - 21 - -  01/18/15 1845 135/77 mmHg 99.9 F (37.7 C) - - (!) 32 - -  01/18/15 1830 127/76 mmHg 97.8 F (36.6 C) - - (!) 36 - -  01/18/15 1829 - 97.6 F (36.4 C) - - - - -  01/18/15 1817 111/71 mmHg - - - 22 - -  01/18/15 1755 - - - - - - 250 lb (113.399 kg)  01/18/15 1730 109/61 mmHg - - 112 (!) 29 97 % -  01/18/15 1724 124/63 mmHg 99.1 F (37.3 C) Rectal 115 (!) 41 92 % -      Physical Exam  1735: Physical examination:  Nursing notes reviewed; Vital signs and O2 SAT reviewed;  Constitutional: Well developed, Well nourished, In no acute distress; Head:  Normocephalic, atraumatic; Eyes: EOMI, PERRL, No scleral icterus; ENMT: Mouth and pharynx normal, Mucous membranes dry; Neck: Supple, Full range of motion, No lymphadenopathy; Cardiovascular: Tachycardic rate and rhythm, No gallop; Respiratory: Breath sounds clear & equal bilaterally, No wheezes.  Speaking full sentences with ease, Normal respiratory effort/excursion; Chest: Nontender, Movement normal; Abdomen: Soft, Nontender, Nondistended, Normal bowel sounds; Genitourinary: No CVA tenderness; Extremities: Pulses normal, No tenderness, +1 pedal edema bilat without calf asymmetry.; Neuro: Awake, alert, eyes open, moans. Non-verbal per baseline. Right sided weakness per baseline.; Skin: Color normal, Warm, Dry.   ED Course  Procedures (including critical care time) Labs Review   Imaging Review  I have personally reviewed and evaluated these images and lab results as part of my medical decision-making.   EKG Interpretation None      MDM  MDM Reviewed: previous chart, nursing note and vitals Reviewed previous: labs and ECG Interpretation: labs, ECG and x-ray Total time providing critical care: 30-74 minutes. This excludes time spent performing separately  reportable procedures and services. Consults: admitting MD     CRITICAL CARE Performed by: Alfonzo Feller Total critical care time: 35 Critical care time was exclusive of separately billable procedures and treating other patients. Critical care was necessary to treat or prevent imminent or life-threatening deterioration. Critical care was time spent personally by me on the following activities: development of treatment plan with patient and/or  surrogate as well as nursing, discussions with consultants, evaluation of patient's response to treatment, examination of patient, obtaining history from patient or surrogate, ordering and performing treatments and interventions, ordering and review of laboratory studies, ordering and review of radiographic studies, pulse oximetry and re-evaluation of patient's condition.  Results for orders placed or performed during the hospital encounter of 01/18/15  CBC WITH DIFFERENTIAL  Result Value Ref Range   WBC 4.3 4.0 - 10.5 K/uL   RBC 1.33 (L) 3.87 - 5.11 MIL/uL   Hemoglobin 3.5 (LL) 12.0 - 15.0 g/dL   HCT 11.5 (L) 36.0 - 46.0 %   MCV 86.5 78.0 - 100.0 fL   MCH 26.3 26.0 - 34.0 pg   MCHC 30.4 30.0 - 36.0 g/dL   RDW 23.7 (H) 11.5 - 15.5 %   Platelets 20 (LL) 150 - 400 K/uL   Neutrophils Relative % 49 43 - 77 %   Neutro Abs 2.1 1.7 - 7.7 K/uL   Lymphocytes Relative 24 12 - 46 %   Lymphs Abs 1.0 0.7 - 4.0 K/uL   Monocytes Relative 25 (H) 3 - 12 %   Monocytes Absolute 1.1 (H) 0.1 - 1.0 K/uL   Eosinophils Relative 1 0 - 5 %   Eosinophils Absolute 0.0 0.0 - 0.7 K/uL   Basophils Relative 1 0 - 1 %   Basophils Absolute 0.0 0.0 - 0.1 K/uL   RBC Morphology POLYCHROMASIA PRESENT    Smear Review SPECIMEN CHECKED FOR CLOTS   Urinalysis, Routine w reflex microscopic (not at [facility])  Result Value Ref Range   Color, Urine AMBER (A) YELLOW   APPearance HAZY (A) CLEAR   Specific Gravity, Urine >1.030 (H) 1.005 - 1.030   pH 5.5 5.0 - 8.0   Glucose, UA  NEGATIVE NEGATIVE mg/dL   Hgb urine dipstick NEGATIVE NEGATIVE   Bilirubin Urine MODERATE (A) NEGATIVE   Ketones, ur 15 (A) NEGATIVE mg/dL   Protein, ur 30 (A) NEGATIVE mg/dL   Urobilinogen, UA 1.0 0.0 - 1.0 mg/dL   Nitrite NEGATIVE NEGATIVE   Leukocytes, UA NEGATIVE NEGATIVE  Protime-INR  Result Value Ref Range   Prothrombin Time >90.0 (H) 11.6 - 15.2 seconds   INR >10.00 (HH) 0.00 - 1.49  CBC with Differential/Platelet  Result Value Ref Range   WBC 8.3 4.0 - 10.5 K/uL   RBC 2.60 (L) 3.87 - 5.11 MIL/uL   Hemoglobin 7.1 (L) 12.0 - 15.0 g/dL   HCT 22.4 (L) 36.0 - 46.0 %   MCV 86.2 78.0 - 100.0 fL   MCH 27.3 26.0 - 34.0 pg   MCHC 31.7 30.0 - 36.0 g/dL   RDW 23.7 (H) 11.5 - 15.5 %   Platelets 40 (L) 150 - 400 K/uL   Neutrophils Relative % 50 43 - 77 %   Neutro Abs 4.1 1.7 - 7.7 K/uL   Lymphocytes Relative 26 12 - 46 %   Lymphs Abs 2.1 0.7 - 4.0 K/uL   Monocytes Relative 23 (H) 3 - 12 %   Monocytes Absolute 1.9 (H) 0.1 - 1.0 K/uL   Eosinophils Relative 1 0 - 5 %   Eosinophils Absolute 0.1 0.0 - 0.7 K/uL   Basophils Relative 0 0 - 1 %   Basophils Absolute 0.0 0.0 - 0.1 K/uL   WBC Morphology ATYPICAL LYMPHOCYTES    RBC Morphology TARGET CELLS    Smear Review PLATELETS APPEAR DECREASED   Comprehensive metabolic panel  Result Value Ref Range   Sodium 142 135 - 145  mmol/L   Potassium 3.4 (L) 3.5 - 5.1 mmol/L   Chloride 113 (H) 101 - 111 mmol/L   CO2 25 22 - 32 mmol/L   Glucose, Bld 121 (H) 65 - 99 mg/dL   BUN 20 6 - 20 mg/dL   Creatinine, Ser 0.60 0.44 - 1.00 mg/dL   Calcium 10.5 (H) 8.9 - 10.3 mg/dL   Total Protein 4.8 (L) 6.5 - 8.1 g/dL   Albumin 2.4 (L) 3.5 - 5.0 g/dL   AST 44 (H) 15 - 41 U/L   ALT 13 (L) 14 - 54 U/L   Alkaline Phosphatase 58 38 - 126 U/L   Total Bilirubin 2.4 (H) 0.3 - 1.2 mg/dL   GFR calc non Af Amer >60 >60 mL/min   GFR calc Af Amer >60 >60 mL/min   Anion gap 4 (L) 5 - 15  Protime-INR  Result Value Ref Range   Prothrombin Time 49.0 (H) 11.6 -  15.2 seconds   INR 5.65 (HH) 0.00 - 1.49  Urine microscopic-add on  Result Value Ref Range   Squamous Epithelial / LPF MANY (A) RARE   WBC, UA 3-6 <3 WBC/hpf   RBC / HPF 0-2 <3 RBC/hpf   Bacteria, UA FEW (A) RARE   Casts GRANULAR CAST (A) NEGATIVE   Urine-Other YEAST   I-Stat CG4 Lactic Acid, ED  (not at  [facility])  Result Value Ref Range   Lactic Acid, Venous 2.13 (HH) 0.5 - 2.0 mmol/L   Comment NOTIFIED PHYSICIAN   I-stat troponin, ED  Result Value Ref Range   Troponin i, poc 0.00 0.00 - 0.08 ng/mL   Comment 3          I-Stat CG4 Lactic Acid, ED  Result Value Ref Range   Lactic Acid, Venous 1.60 0.5 - 2.0 mmol/L  I-stat Chem 8, ED  Result Value Ref Range   Sodium 143 135 - 145 mmol/L   Potassium 3.4 (L) 3.5 - 5.1 mmol/L   Chloride 109 101 - 111 mmol/L   BUN 18 6 - 20 mg/dL   Creatinine, Ser 0.60 0.44 - 1.00 mg/dL   Glucose, Bld 119 (H) 65 - 99 mg/dL   Calcium, Ion 1.66 (H) 1.13 - 1.30 mmol/L   TCO2 23 0 - 100 mmol/L   Hemoglobin 7.8 (L) 12.0 - 15.0 g/dL   HCT 23.0 (L) 36.0 - 46.0 %  Sample to Blood Bank  Result Value Ref Range   Blood Bank Specimen SAMPLE AVAILABLE FOR TESTING    Sample Expiration 01/21/2015    Dg Chest Port 1 View 01/18/2015   CLINICAL DATA:  Low grade fever and shortness of breath, acute onset earlier today. Code sepsis. Current history of diabetes, hypertension, CHF, asthma and COPD. Personal history of left breast cancer.  EXAM: PORTABLE CHEST - 1 VIEW  COMPARISON:  01/11/2015 and earlier.  FINDINGS: Suboptimal inspiration accounts for crowded bronchovascular markings diffusely and atelectasis in the bases, left greater than right, and accentuates the cardiac silhouette. Taking this into account, cardiac silhouette mildly enlarged, unchanged. Thoracic aorta tortuous and atherosclerotic, unchanged. Hilar and mediastinal contours otherwise unremarkable. Lungs otherwise clear. No confluent airspace consolidation. No visible pleural effusions. Surgical clips in  the right axilla from prior node dissection.  IMPRESSION: Suboptimal inspiration accounts for bibasilar atelectasis, left greater than right. No acute cardiopulmonary disease otherwise.   Electronically Signed   By: Evangeline Dakin M.D.   On: 01/18/2015 18:11     1730:  Code Sepsis called after pt evaluation.  Labs, CXR, UA/UC, EKG ordered per protocol, as well as IVF bolus. Will also place temp foley.   1855:  Labs drawn just proximal to IVF NS infusion; likely diluted. Will re-draw. APAP given for temp 100.1.    2055:  Repeat labs obtained. Lactic acid trending downward after IVF. INR elevated, will need to be held as inpt. H/H mildly decreased from baseline; T&S ordered. Dx and testing d/w pt's family.  Questions answered.  Verb understanding, agreeable to admit.  T/C to [facility] Dr. Darrick Meigs, case discussed, including:  HPI, pertinent PM/SHx, VS/PE, dx testing, ED course and treatment:  Agreeable to admit, requests he will come to the ED for evaluation.   Francine Graven, DO 01/22/15 2358

## 2015-01-18 NOTE — ED Notes (Signed)
CRITICAL VALUE ALERT  Critical value received:   HGB 7.1 Platlets 40,000 INR 5.65  Date of notification:  01/18/15  Time of notification:  2027  Critical value read back: yes   Nurse who received alert:  Charlies Silvers  MD notified (1st page):  Thurnell Garbe

## 2015-01-18 NOTE — Telephone Encounter (Signed)
Patient is unable to travel by private vehicle and home health is seeing her routinely.  Advised that she doesn't have to come in for hospital follow up at this time and that home health can report her INR.

## 2015-01-18 NOTE — ED Notes (Signed)
Lab called to report critical value hemoglobin 3.6, and platelets 20,000.  Results reported to Dr. Thurnell Garbe, suspicion of diluted blood due to Iv fluids running through other IV site during blood draw.  Lab notified to redraw blood, labs reordered.

## 2015-01-18 NOTE — Progress Notes (Signed)
ANTIBIOTIC CONSULT NOTE - FOLLOW UP  Pharmacy Consult for Vancomycin and Zosyn  Indication: rule out sepsis  Allergies  Allergen Reactions  . Ultram [Tramadol] Shortness Of Breath    Patient Measurements: Height: 5\' 6"  (167.6 cm) Weight: 250 lb (113.399 kg) IBW/kg (Calculated) : 59.3   Vital Signs: Temp: 99.6 F (37.6 C) (08/29 2130) Temp Source: Rectal (08/29 1724) BP: 114/69 mmHg (08/29 2130) Pulse Rate: 102 (08/29 2130) Intake/Output from previous day:   Intake/Output from this shift:    Labs:  Recent Labs  01/18/15 1750 01/18/15 1945 01/18/15 1949  WBC 4.3 8.3  --   HGB 3.5* 7.1* 7.8*  PLT 20* 40*  --   CREATININE  --  0.60 0.60   Estimated Creatinine Clearance: 80 mL/min (by C-G formula based on Cr of 0.6). No results for input(s): VANCOTROUGH, VANCOPEAK, VANCORANDOM, GENTTROUGH, GENTPEAK, GENTRANDOM, TOBRATROUGH, TOBRAPEAK, TOBRARND, AMIKACINPEAK, AMIKACINTROU, AMIKACIN in the last 72 hours.   Microbiology: Recent Results (from the past 720 hour(s))  C difficile quick scan w PCR reflex     Status: None   Collection Time: 01/01/15  3:06 PM  Result Value Ref Range Status   C Diff antigen NEGATIVE NEGATIVE Final   C Diff toxin NEGATIVE NEGATIVE Final   C Diff interpretation Negative for toxigenic C. difficile  Final  MRSA PCR Screening     Status: None   Collection Time: 01/02/15  9:30 PM  Result Value Ref Range Status   MRSA by PCR NEGATIVE NEGATIVE Final    Comment:        The GeneXpert MRSA Assay (FDA approved for NASAL specimens only), is one component of a comprehensive MRSA colonization surveillance program. It is not intended to diagnose MRSA infection nor to guide or monitor treatment for MRSA infections.   Culture, blood (x 2)     Status: None   Collection Time: 01/04/15  7:28 AM  Result Value Ref Range Status   Specimen Description BLOOD LEFT HAND  Final   Special Requests BOTTLES DRAWN AEROBIC AND ANAEROBIC 5CC  Final   Culture NO  GROWTH 5 DAYS  Final   Report Status 01/09/2015 FINAL  Final  Culture, blood (x 2)     Status: None   Collection Time: 01/04/15  7:30 AM  Result Value Ref Range Status   Specimen Description BLOOD LEFT WRIST  Final   Special Requests BOTTLES DRAWN AEROBIC AND ANAEROBIC 5CC  Final   Culture NO GROWTH 5 DAYS  Final   Report Status 01/09/2015 FINAL  Final  Urine culture     Status: None   Collection Time: 01/04/15  3:15 PM  Result Value Ref Range Status   Specimen Description URINE, CATHETERIZED  Final   Special Requests NONE  Final   Culture   Final    NO GROWTH 2 DAYS Performed at [facility]    Report Status 01/06/2015 FINAL  Final  Rapid strep screen (not at [facility])     Status: None   Collection Time: 01/06/15  3:20 PM  Result Value Ref Range Status   Streptococcus, Group A Screen (Direct) NEGATIVE NEGATIVE Final    Comment: (NOTE) A Rapid Antigen test may result negative if the antigen level in the sample is below the detection level of this test. The FDA has not cleared this test as a stand-alone test therefore the rapid antigen negative result has reflexed to a Group A Strep culture.   Culture, Group A Strep     Status:  None   Collection Time: 01/06/15  3:20 PM  Result Value Ref Range Status   Strep A Culture Negative  Final    Comment: (NOTE) Performed At: [facility] [place], [place] [id number] [name] MD GY:[id number]   Urine culture     Status: None   Collection Time: 01/11/15  1:41 PM  Result Value Ref Range Status   Specimen Description URINE, CATHETERIZED  Final   Special Requests NONE  Final   Culture   Final    NO GROWTH 2 DAYS Performed at [facility]    Report Status 01/13/2015 FINAL  Final  Urine culture     Status: None   Collection Time: 01/11/15  7:00 PM  Result Value Ref Range Status   Specimen Description URINE, RANDOM  Final   Special Requests NONE  Final   Culture CANCELLED BY LAB DUPLICATE  REQUEST  Final   Report Status 01/13/2015 FINAL  Final    Anti-infectives    Start     Dose/Rate Route Frequency Ordered Stop   01/18/15 1745  piperacillin-tazobactam (ZOSYN) IVPB 3.375 g     3.375 g 100 mL/hr over 30 Minutes Intravenous  Once 01/18/15 1733 01/18/15 1844   01/18/15 1745  vancomycin (VANCOCIN) IVPB 1000 mg/200 mL premix     1,000 mg 200 mL/hr over 60 Minutes Intravenous  Once 01/18/15 1733 01/18/15 1938     Assessment: Okay for Protocol, received initial doses in ED. Obesity/Normalized CrCl Vancomycin dosing protocol will be initiated with an estimated normalized CrCl = 95 ml/min.   Goal of Therapy:  Vancomycin trough level 15-20 mcg/ml  Plan:  Additional 1gm IV Vancomycin this PM for 2gm load, then 1gm IV every 12 hours. Zosyn 3.375gm IV every 8 hours. Follow-up micro data, labs, vitals.  Measure antibiotic drug levels at steady state Follow up culture results  [name] 01/18/2015,9:52 PM

## 2015-01-18 NOTE — ED Notes (Signed)
Called pbt to request blood culture draws.

## 2015-01-18 NOTE — Progress Notes (Signed)
Pharmacy Note:  Initial antibiotics for Vancomycin and Zosyn ordered by EDP for Sepsis.  Estimated Creatinine Clearance: 61.8 mL/min (by C-G formula based on Cr of 0.96).   Allergies  Allergen Reactions  . Ultram [Tramadol] Shortness Of Breath    Filed Vitals:   01/18/15 1724  BP: 124/63  Pulse: 115  Temp: 99.1 F (37.3 C)  Resp: 41    Anti-infectives    Start     Dose/Rate Route Frequency Ordered Stop   01/18/15 1745  piperacillin-tazobactam (ZOSYN) IVPB 3.375 g     3.375 g 100 mL/hr over 30 Minutes Intravenous  Once 01/18/15 1733     01/18/15 1745  vancomycin (VANCOCIN) IVPB 1000 mg/200 mL premix     1,000 mg 200 mL/hr over 60 Minutes Intravenous  Once 01/18/15 1733       Plan: Initial doses of Vancomycin 1gm and Zosyn 3.375gm X 1 ordered. F/U admission orders for further dosing if therapy continued.  [name], [facility] 01/18/2015 5:39 PM

## 2015-01-18 NOTE — ED Notes (Signed)
Pt placed on 2L 02, respirations rapid.

## 2015-01-18 NOTE — ED Notes (Signed)
Per EMS: Pt reports home health nurse 101.6 fever, no intake or fluid for last 2 days. Hx right sided stroke.  Pt nonverbal.  112/68 96%, 111 blood sugar

## 2015-01-18 NOTE — H&P (Addendum)
PCP:   [name], MD   Chief Complaint:  Failure to thrive  HPI: 74 yr old female who    has a past medical history of CHF (congestive heart failure); Diabetes mellitus without complication; COPD (chronic obstructive pulmonary disease); Hypertension; Asthma; Shortness of breath; Anxiety; Pneumonia; Gout; Cancer; Anticoagulant long-term use; echocardiogram (12/2014); Stroke (2013); Thrombocytopenia; Encephalopathy (8/22-8/26/2016 admission); and Charcot foot due to diabetes mellitus. Today was brought to the ED for not eating and drinking for past few days. Patient was recently discharged from the hospital, after she was admitted to the hospital for altered mental status. At that time neurology was  consulted and felt  like dementia with delirium secondary to alteration of sleep/wake cycle and change of location. At that time palliative care was consulted but no discussion took place as all family members were not available. Today patient was again brought to the hospital by daughters as patient was not eating and drinking. Patient is nonverbal and unable to provide any significant history. There is no history of chest pain, no shortness of breath no nausea vomiting or diarrhea. In the ED patient was found to have mild elevation of lactic acid 2.13, with fever temp of 101 and empirically started on vancomycin and Zosyn for possible sepsis   Allergies:   Allergies  Allergen Reactions  . Ultram [Tramadol] Shortness Of Breath      Past Medical History  Diagnosis Date  . CHF (congestive heart failure)   . Diabetes mellitus without complication   . COPD (chronic obstructive pulmonary disease)   . Hypertension   . Asthma   . Shortness of breath   . Anxiety   . Pneumonia   . Gout   . 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 Anticoagulant long-term use   . Hx of echocardiogram 12/2014    EF 65-70%  . 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  Thrombocytopenia   . 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 Charcot foot due to diabetes mellitus     Past Surgical History  Procedure Laterality Date  . Tubal ligation    . Colonoscopy  04/04/2002    PYK:DXIPJASN hemorrhoids, otherwise, normal rectum/Scattered left sided diverticula/Somewhat adenomatous appearing ileocecal valve which is probably normal but this area was biopsied. The remainder of the colonic mucosa appeared   normal  . Trigger finger surgery      twice  . Colonoscopy with esophagogastroduodenoscopy (egd) N/A 02/20/2013    Procedure: COLONOSCOPY WITH ESOPHAGOGASTRODUODENOSCOPY (EGD);  Surgeon: Daneil Dolin, MD;  Location: [facility];  Service: Endoscopy;  Laterality: N/A;  . Breast surgery Left 2005    lumpectomy and removed lymph nodes    Prior to Admission medications   Medication Sig Start Date End Date Taking? Authorizing Provider  gabapentin (NEURONTIN) 100 MG capsule Take 1 capsule (100 mg total) by mouth at bedtime. 01/15/15  Yes Geradine Girt, DO  metoprolol tartrate (LOPRESSOR) 25 MG tablet TAKE 1/2 TABLET TWICE DAILY 11/30/14  Yes [name], MD  montelukast (SINGULAIR) 10 MG tablet TAKE 1 TABLET DAILY IN THE EVENING 11/30/14  Yes [name], MD  atorvastatin (LIPITOR) 40 MG tablet TAKE ONE (1) TABLET EACH DAY Patient not taking: Reported on 01/18/2015 11/30/14   [name], MD  feeding supplement, ENSURE ENLIVE, (ENSURE ENLIVE) LIQD Take 237 mLs by mouth 2 (two) times  daily between meals. 01/15/15   Geradine Girt, DO    Social History:  reports that she has never smoked. She has never used smokeless tobacco. She reports that she does not drink alcohol or use illicit drugs.  Family History  Problem Relation Age of Onset  . Colon cancer Neg Hx   . Liver disease Neg Hx   . Heart disease Mother   . Hypertension Mother   . Cancer Father     lung  . Cancer Brother     prostate  . Diabetes  Brother   . Diabetes Sister   . Cancer Sister     breast cancer  . Hypertension Sister   . Diabetes Sister   . Hypertension Sister   . Diabetes Sister   . Hypertension Sister   . Hypertension Sister   . Cancer Brother   . Diabetes Brother   . Diabetes Brother     Danley Danker Weights   01/18/15 1755  Weight: 113.399 kg (250 lb)      Review of Systems:  Unobtainable as patient is nonverbal   Physical Exam: Blood pressure 100/59, pulse 100, temperature 99.5 F (37.5 C), temperature source Rectal, resp. rate 22, weight 113.399 kg (250 lb), SpO2 100 %. Constitutional:   Patient appears in no acute distress, unable to cooperate with exam due to dementia. Head: Normocephalic and atraumatic Mouth: Mucus membranes moist Neck: Supple, No Thyromegaly Cardiovascular: RRR, S1 normal, S2 normal Pulmonary/Chest: CTAB, no wheezes, rales, or rhonchi Abdominal: Soft. Non-tender, non-distended, bowel sounds are normal, no masses, organomegaly, or guarding present.  Neurological: A&O x3, Strength is normal and symmetric bilaterally, cranial nerve II-XII are grossly intact, no focal motor deficit, sensory intact to light touch bilaterally.  Extremities : No Cyanosis, Clubbing or Edema  Labs on Admission:  Basic Metabolic Panel:  Recent Labs Lab 01/13/15 0549 01/18/15 1945 01/18/15 1949  NA 140 142 143  K 3.5 3.4* 3.4*  CL 105 113* 109  CO2 24 25  --   GLUCOSE 108* 121* 119*  BUN 27* 20 18  CREATININE 0.96 0.60 0.60  CALCIUM 11.0* 10.5*  --    Liver Function Tests:  Recent Labs Lab 01/18/15 1945  AST 44*  ALT 13*  ALKPHOS 58  BILITOT 2.4*  PROT 4.8*  ALBUMIN 2.4*   No results for input(s): LIPASE, AMYLASE in the last 168 hours.  Recent Labs Lab 01/12/15 1145  AMMONIA 31   CBC:  Recent Labs Lab 01/18/15 1750 01/18/15 1945 01/18/15 1949  WBC 4.3 8.3  --   NEUTROABS 2.1 4.1  --   HGB 3.5* 7.1* 7.8*  HCT 11.5* 22.4* 23.0*  MCV 86.5 86.2  --   PLT 20* 40*  --      CBG:  Recent Labs Lab 01/14/15 1624 01/14/15 2055 01/15/15 0633 01/15/15 1157 01/15/15 1619  GLUCAP 111* 106* 93 102* 91    Radiological Exams on Admission: Dg Chest Port 1 View  01/18/2015   CLINICAL DATA:  Low grade fever and shortness of breath, acute onset earlier today. Code sepsis. Current history of diabetes, hypertension, CHF, asthma and COPD. Personal history of left breast cancer.  EXAM: PORTABLE CHEST - 1 VIEW  COMPARISON:  01/11/2015 and earlier.  FINDINGS: Suboptimal inspiration accounts for crowded bronchovascular markings diffusely and atelectasis in the bases, left greater than right, and accentuates the cardiac silhouette. Taking this into account, cardiac silhouette mildly enlarged, unchanged. Thoracic aorta tortuous and atherosclerotic, unchanged. Hilar and mediastinal contours otherwise unremarkable.  Lungs otherwise clear. No confluent airspace consolidation. No visible pleural effusions. Surgical clips in the right axilla from prior node dissection.  IMPRESSION: Suboptimal inspiration accounts for bibasilar atelectasis, left greater than right. No acute cardiopulmonary disease otherwise.   Electronically Signed   By: Evangeline Dakin M.D.   On: 01/18/2015 18:11    EKG: Independently reviewed. Sinus tachycardia   Assessment/Plan Active Problems:   History of CVA (cerebrovascular accident)   HTN (hypertension)   DM II (diabetes mellitus, type II), controlled   HLD (hyperlipidemia)   Warfarin-induced coagulopathy   Failure to thrive in adult  Failure to thrive Patient being admitted for failure to thrive, has not been eating and drinking for past 3 weeks. No clear source of infection, empirically started on vancomycin and Zosyn Follow the culture results If no infection suspected can discontinue antibiotic Patient will benefit from palliative care consultation for long-term goals of care including long-term nutrition with G-tube versus comfort  measures  Warfarin-induced coagulopathy Patient's INR is still significantly elevated 5.65, will hold the Coumadin at this time No indication for vitamin K as patient is not actively bleeding at this time. Will check INR in a.m.  Diabetes mellitus As this and patient is not eating and drinking, will keep her nothing by mouth We'll start D5normal saline at 100 mL per hour Sliding scale insulin with NovoLog  Fever ? Cause, has mild elevation of lactic acid Empirically started on vancomycin and Zosyn Follow culture results   Code status: Full code  Family discussion: Admission, patients condition and plan of care including tests being ordered have been discussed with patient's daughters at bedside* who indicate understanding and agree with the plan and Code Status.   Time Spent on Admission: 60 min  [facility] Pager: [phone] 01/18/2015, 9:16 PM  If 7PM-7AM, please contact night-coverage  [email or web address]  Password [email or web address]

## 2015-01-19 ENCOUNTER — Other Ambulatory Visit: Payer: Self-pay | Admitting: *Deleted

## 2015-01-19 ENCOUNTER — Ambulatory Visit: Payer: Medicare Other | Admitting: Family Medicine

## 2015-01-19 LAB — CBC
HCT: 23.9 % — ABNORMAL LOW (ref 36.0–46.0)
Hemoglobin: 7.5 g/dL — ABNORMAL LOW (ref 12.0–15.0)
MCH: 27.3 pg (ref 26.0–34.0)
MCHC: 31.4 g/dL (ref 30.0–36.0)
MCV: 86.9 fL (ref 78.0–100.0)
PLATELETS: 37 10*3/uL — AB (ref 150–400)
RBC: 2.75 MIL/uL — ABNORMAL LOW (ref 3.87–5.11)
RDW: 23.9 % — ABNORMAL HIGH (ref 11.5–15.5)
WBC: 10.6 10*3/uL — ABNORMAL HIGH (ref 4.0–10.5)

## 2015-01-19 LAB — GLUCOSE, CAPILLARY
GLUCOSE-CAPILLARY: 110 mg/dL — AB (ref 65–99)
GLUCOSE-CAPILLARY: 129 mg/dL — AB (ref 65–99)
GLUCOSE-CAPILLARY: 118 mg/dL — AB (ref 65–99)
GLUCOSE-CAPILLARY: 119 mg/dL — AB (ref 65–99)

## 2015-01-19 LAB — COMPREHENSIVE METABOLIC PANEL
ALT: 15 U/L (ref 14–54)
ANION GAP: 6 (ref 5–15)
AST: 52 U/L — ABNORMAL HIGH (ref 15–41)
Albumin: 2.4 g/dL — ABNORMAL LOW (ref 3.5–5.0)
Alkaline Phosphatase: 67 U/L (ref 38–126)
BUN: 18 mg/dL (ref 6–20)
CALCIUM: 10.9 mg/dL — AB (ref 8.9–10.3)
CHLORIDE: 112 mmol/L — AB (ref 101–111)
CO2: 25 mmol/L (ref 22–32)
Creatinine, Ser: 0.59 mg/dL (ref 0.44–1.00)
Glucose, Bld: 114 mg/dL — ABNORMAL HIGH (ref 65–99)
Potassium: 3.4 mmol/L — ABNORMAL LOW (ref 3.5–5.1)
SODIUM: 143 mmol/L (ref 135–145)
Total Bilirubin: 2.4 mg/dL — ABNORMAL HIGH (ref 0.3–1.2)
Total Protein: 5.1 g/dL — ABNORMAL LOW (ref 6.5–8.1)

## 2015-01-19 LAB — PROTIME-INR
INR: 5.87 — AB (ref 0.00–1.49)
PROTHROMBIN TIME: 50.7 s — AB (ref 11.6–15.2)

## 2015-01-19 MED ORDER — WARFARIN - PHARMACIST DOSING INPATIENT: Status: DC

## 2015-01-19 MED ORDER — DEXTROSE-NACL 5-0.9 % IV SOLN
INTRAVENOUS | Status: DC
  Administered 2015-01-19 (×2): via INTRAVENOUS

## 2015-01-19 MED ORDER — HYDROCODONE-ACETAMINOPHEN 7.5-325 MG PO TABS
ORAL_TABLET | ORAL | Status: DC
Start: 2015-01-19 — End: 2015-01-22

## 2015-01-19 NOTE — Progress Notes (Signed)
[facility] PROGRESS NOTE  Brittany Clarke JQZ:[id number] DOB: 1940-12-04 DOA: 01/18/2015 PCP: [name], MD  Assessment/Plan: FTT/Acute on Chronic Encephalopathy -No clear etiology. -Given age and multiple admissions for same, agree she would benefit from a palliative care consultation to further clarify goals of care.  Fever -Etiology unclear. -Was started on broad spectrum abx in ED; will discontinue for now, -Cx data is pending.  Warfarin-Induced Coagulopathy -INR is 5.87, coumadin remains on hold. -Her indication for coumadin is a prior CVA. Will discuss with family cessation of coumadin indefinitely as she can probably be on an antiplatelet agent, frequently has supratherapeutic INRs and her age and current frailty and encephalopathy would predispose her to falls.  DM II -Well controlled. -Continue current management.  Anemia/Thrombocytopenia -Has had low platelets prior 2 hospitalizations. -Concern for MDS. -Do not believe she is stable enough for bone marrow biopsy given all her conditions. -Await palliative care discussion.   Code Status: Full Code for now Family Communication: No family members present and unable to reach via phone. Left message for daughter Benjamine Mola on phone number on file. Disposition Plan: To be determined   Consultants:  Palliative care   Antibiotics:  None   Subjective: In bed, confused, moaning, unable to clearly answer any questions,  Objective: Filed Vitals:   01/18/15 2145 01/18/15 2200 01/18/15 2240 01/19/15 0500  BP: 121/61 134/121 116/60 142/86  Pulse:  105  115  Temp: 99.6 F (37.6 C) 99.7 F (37.6 C) 98.4 F (36.9 C) 99 F (37.2 C)  TempSrc:   Oral Axillary  Resp: '25 26 30 29  ' Height:      Weight:   99.5 kg (219 lb 5.7 oz)   SpO2:  99% 96% 99%    Intake/Output Summary (Last 24 hours) at 01/19/15 1115 Last data filed at 01/19/15 0800  Gross per 24 hour  Intake     50 ml  Output    275 ml    Net   -225 ml   Filed Weights   01/18/15 1755 01/18/15 2240  Weight: 113.399 kg (250 lb) 99.5 kg (219 lb 5.7 oz)    Exam:   General:  Awake, not oriented  Cardiovascular: RRR  Respiratory: CTA B  Abdomen: obese/S/NT/ND  Extremities: trace bilateral edema   Neurologic:  nonfocal  Data Reviewed: Basic Metabolic Panel:  Recent Labs Lab 01/13/15 0549 01/18/15 1945 01/18/15 1949 01/19/15 0545  NA 140 142 143 143  K 3.5 3.4* 3.4* 3.4*  CL 105 113* 109 112*  CO2 24 25  --  25  GLUCOSE 108* 121* 119* 114*  BUN 27* '20 18 18  ' CREATININE 0.96 0.60 0.60 0.59  CALCIUM 11.0* 10.5*  --  10.9*   Liver Function Tests:  Recent Labs Lab 01/18/15 1945 01/19/15 0545  AST 44* 52*  ALT 13* 15  ALKPHOS 58 67  BILITOT 2.4* 2.4*  PROT 4.8* 5.1*  ALBUMIN 2.4* 2.4*   No results for input(s): LIPASE, AMYLASE in the last 168 hours.  Recent Labs Lab 01/12/15 1145  AMMONIA 31   CBC:  Recent Labs Lab 01/18/15 1750 01/18/15 1945 01/18/15 1949 01/19/15 0545  WBC 4.3 8.3  --  10.6*  NEUTROABS 2.1 4.1  --   --   HGB 3.5* 7.1* 7.8* 7.5*  HCT 11.5* 22.4* 23.0* 23.9*  MCV 86.5 86.2  --  86.9  PLT 20* 40*  --  37*   Cardiac Enzymes: No results for input(s): CKTOTAL, CKMB, CKMBINDEX, TROPONINI in  the last 168 hours. BNP (last 3 results) No results for input(s): BNP in the last 8760 hours.  ProBNP (last 3 results) No results for input(s): PROBNP in the last 8760 hours.  CBG:  Recent Labs Lab 01/15/15 0633 01/15/15 1157 01/15/15 1619 01/18/15 2249 01/19/15 0740  GLUCAP 93 102* 91 107* 110*    Recent Results (from the past 240 hour(s))  Urine culture     Status: None   Collection Time: 01/11/15  1:41 PM  Result Value Ref Range Status   Specimen Description URINE, CATHETERIZED  Final   Special Requests NONE  Final   Culture   Final    NO GROWTH 2 DAYS Performed at [facility]    Report Status 01/13/2015 FINAL  Final  Urine culture     Status: None    Collection Time: 01/11/15  7:00 PM  Result Value Ref Range Status   Specimen Description URINE, RANDOM  Final   Special Requests NONE  Final   Culture CANCELLED BY LAB DUPLICATE REQUEST  Final   Report Status 01/13/2015 FINAL  Final  Blood Culture (routine x 2)     Status: None (Preliminary result)   Collection Time: 01/18/15  6:00 PM  Result Value Ref Range Status   Specimen Description BLOOD LEFT HAND DRAWN BY RN  Final   Special Requests BOTTLES DRAWN AEROBIC ONLY 4CC  Final   Culture NO GROWTH < 24 HOURS  Final   Report Status PENDING  Incomplete  Blood Culture (routine x 2)     Status: None (Preliminary result)   Collection Time: 01/18/15  6:10 PM  Result Value Ref Range Status   Specimen Description BLOOD LEFT ANTECUBITAL  Final   Special Requests BOTTLES DRAWN AEROBIC ONLY 6CC  Final   Culture NO GROWTH < 24 HOURS  Final   Report Status PENDING  Incomplete     Studies: Dg Chest Port 1 View  01/18/2015   CLINICAL DATA:  Low grade fever and shortness of breath, acute onset earlier today. Code sepsis. Current history of diabetes, hypertension, CHF, asthma and COPD. Personal history of left breast cancer.  EXAM: PORTABLE CHEST - 1 VIEW  COMPARISON:  01/11/2015 and earlier.  FINDINGS: Suboptimal inspiration accounts for crowded bronchovascular markings diffusely and atelectasis in the bases, left greater than right, and accentuates the cardiac silhouette. Taking this into account, cardiac silhouette mildly enlarged, unchanged. Thoracic aorta tortuous and atherosclerotic, unchanged. Hilar and mediastinal contours otherwise unremarkable. Lungs otherwise clear. No confluent airspace consolidation. No visible pleural effusions. Surgical clips in the right axilla from prior node dissection.  IMPRESSION: Suboptimal inspiration accounts for bibasilar atelectasis, left greater than right. No acute cardiopulmonary disease otherwise.   Electronically Signed   By: Evangeline Dakin M.D.   On:  01/18/2015 18:11    Scheduled Meds: . gabapentin  100 mg Oral QHS  . insulin aspart  0-9 Units Subcutaneous TID WC  . metoprolol tartrate  12.5 mg Oral BID  . montelukast  10 mg Oral QPM  . potassium chloride  40 mEq Oral Once  . Warfarin - Pharmacist Dosing Inpatient   Does not apply Q24H   Continuous Infusions: . dextrose 5 % and 0.9% NaCl 100 mL/hr at 01/19/15 9449    Active Problems:   History of CVA (cerebrovascular accident)   HTN (hypertension)   DM II (diabetes mellitus, type II), controlled   HLD (hyperlipidemia)   Warfarin-induced coagulopathy   Failure to thrive in adult    Time  spent: 25 minutes. Greater than 50% of this time was spent in direct contact with the patient coordinating care.    Lelon Frohlich  [facility] Pager [phone]  If 7PM-7AM, please contact night-coverage at [email or web address], password [facility] 01/19/2015, 11:15 AM  LOS: 1 day

## 2015-01-19 NOTE — Progress Notes (Signed)
CRITICAL VALUE ALERT  Critical value received:  INR 5.87  Date of notification:  01/19/2015  Time of notification:  0726  Critical value read back YES  Nurse who received alert:  George Hugh  MD notified (1st page):  Dr. Jerilee Hoh  Time of first page:  [phone]

## 2015-01-19 NOTE — Care Management Note (Signed)
Case Management Note  Patient Details  Name: Raelin Pixler MRN: [id number] Date of Birth: 27-Jul-1940  Subjective/Objective:                  Pt admitted from home with FTT, fever. Pt lives with her daughter and daughter in the past has refused to place in SNF. Pt has been active with [facility] in the past.   Action/Plan: CM left VM for pts daughter, Benjamine Mola, to discuss discharge plans. Will continue to try to contact pts daughter by phone or person.  Expected Discharge Date:                  Expected Discharge Plan:  Home w Hospice Care  In-House Referral:  Hospice / Palliative Care  Discharge planning Services  CM Consult  Post Acute Care Choice:    Choice offered to:     DME Arranged:    DME Agency:     HH Arranged:    HH Agency:     Status of Service:  In process, will continue to follow  Medicare Important Message Given:    Date Medicare IM Given:    Medicare IM give by:    Date Additional Medicare IM Given:    Additional Medicare Important Message give by:     If discussed at [place] of Stay Meetings, dates discussed:    Additional Comments:  Joylene Draft, RN 01/19/2015, 1:22 PM

## 2015-01-19 NOTE — Telephone Encounter (Signed)
[facility] 

## 2015-01-19 NOTE — Telephone Encounter (Signed)
Home health report would be okay

## 2015-01-19 NOTE — Progress Notes (Signed)
ANTICOAGULATION CONSULT NOTE - follow up  Pharmacy Consult for Warfarin Indication: stroke  Allergies  Allergen Reactions  . Ultram [Tramadol] Shortness Of Breath   Patient Measurements: Height: 5\' 6"  (167.6 cm) Weight: 219 lb 5.7 oz (99.5 kg) IBW/kg (Calculated) : 59.3  Vital Signs: Temp: 99 F (37.2 C) (08/30 0500) Temp Source: Axillary (08/30 0500) BP: 142/86 mmHg (08/30 0500) Pulse Rate: 115 (08/30 0500)  Labs:  Recent Labs  01/18/15 1750 01/18/15 1945 01/18/15 1949 01/19/15 0545  HGB 3.5* 7.1* 7.8* 7.5*  HCT 11.5* 22.4* 23.0* 23.9*  PLT 20* 40*  --  37*  LABPROT >90.0* 49.0*  --  50.7*  INR >10.00* 5.65*  --  5.87*  CREATININE  --  0.60 0.60 0.59   Estimated Creatinine Clearance: 74.5 mL/min (by C-G formula based on Cr of 0.59).  Medical History: Past Medical History  Diagnosis Date  . CHF (congestive heart failure)   . Diabetes mellitus without complication   . COPD (chronic obstructive pulmonary disease)   . Hypertension   . Asthma   . Shortness of breath   . Anxiety   . Pneumonia   . Gout   . 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 Anticoagulant long-term use   . Hx of echocardiogram 12/2014    EF 65-70%  . 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 Thrombocytopenia   . 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 Charcot foot due to diabetes mellitus    Medications:  Prescriptions prior to admission  Medication Sig Dispense Refill Last Dose  . gabapentin (NEURONTIN) 100 MG capsule Take 1 capsule (100 mg total) by mouth at bedtime. 30 capsule 0 Past Week at Unknown time  . metoprolol tartrate (LOPRESSOR) 25 MG tablet TAKE 1/2 TABLET TWICE DAILY 30 tablet 2 Past Week at Unknown time  . montelukast (SINGULAIR) 10 MG tablet TAKE 1 TABLET DAILY IN THE EVENING 30 tablet 1 Past Week at Unknown time  . atorvastatin (LIPITOR) 40 MG tablet TAKE ONE (1) TABLET EACH DAY (Patient not taking:  Reported on 01/18/2015) 30 tablet 0 01/10/2015 at Unknown time  . feeding supplement, ENSURE ENLIVE, (ENSURE ENLIVE) LIQD Take 237 mLs by mouth 2 (two) times daily between meals. 237 mL 12    Assessment: Okay for Protocol , INR elevated, currently on "hold".  H/H low, thrombocytopenia noted.   Goal of Therapy:  INR 2-3  Plan:  No warfarin today  F/U labs, INR daily F/U [facility] plan  [place] Crane, Ebony Yorio A 01/19/2015,8:28 AM

## 2015-01-20 ENCOUNTER — Inpatient Hospital Stay (HOSPITAL_COMMUNITY): Payer: Medicare Other

## 2015-01-20 DIAGNOSIS — R945 Abnormal results of liver function studies: Secondary | ICD-10-CM

## 2015-01-20 DIAGNOSIS — R1310 Dysphagia, unspecified: Secondary | ICD-10-CM

## 2015-01-20 DIAGNOSIS — T45515A Adverse effect of anticoagulants, initial encounter: Secondary | ICD-10-CM

## 2015-01-20 DIAGNOSIS — R509 Fever, unspecified: Secondary | ICD-10-CM

## 2015-01-20 DIAGNOSIS — D696 Thrombocytopenia, unspecified: Secondary | ICD-10-CM

## 2015-01-20 DIAGNOSIS — D699 Hemorrhagic condition, unspecified: Secondary | ICD-10-CM

## 2015-01-20 DIAGNOSIS — R4182 Altered mental status, unspecified: Secondary | ICD-10-CM | POA: Insufficient documentation

## 2015-01-20 DIAGNOSIS — R7989 Other specified abnormal findings of blood chemistry: Secondary | ICD-10-CM

## 2015-01-20 DIAGNOSIS — R627 Adult failure to thrive: Secondary | ICD-10-CM

## 2015-01-20 DIAGNOSIS — R531 Weakness: Secondary | ICD-10-CM

## 2015-01-20 DIAGNOSIS — G9340 Encephalopathy, unspecified: Secondary | ICD-10-CM

## 2015-01-20 DIAGNOSIS — Z515 Encounter for palliative care: Secondary | ICD-10-CM

## 2015-01-20 LAB — GLUCOSE, CAPILLARY
GLUCOSE-CAPILLARY: 120 mg/dL — AB (ref 65–99)
GLUCOSE-CAPILLARY: 95 mg/dL (ref 65–99)
GLUCOSE-CAPILLARY: 89 mg/dL (ref 65–99)
Glucose-Capillary: 118 mg/dL — ABNORMAL HIGH (ref 65–99)

## 2015-01-20 LAB — CBC
HCT: 22.1 % — ABNORMAL LOW (ref 36.0–46.0)
HEMOGLOBIN: 6.9 g/dL — AB (ref 12.0–15.0)
MCH: 27.8 pg (ref 26.0–34.0)
MCHC: 31.2 g/dL (ref 30.0–36.0)
MCV: 89.1 fL (ref 78.0–100.0)
PLATELETS: 29 10*3/uL — AB (ref 150–400)
RBC: 2.48 MIL/uL — AB (ref 3.87–5.11)
RDW: 24.3 % — ABNORMAL HIGH (ref 11.5–15.5)
WBC: 7.7 10*3/uL (ref 4.0–10.5)

## 2015-01-20 LAB — SEDIMENTATION RATE: Sed Rate: 17 mm/hr (ref 0–22)

## 2015-01-20 LAB — PROTIME-INR
INR: 11.3 — AB (ref 0.00–1.49)
PROTHROMBIN TIME: 83 s — AB (ref 11.6–15.2)

## 2015-01-20 LAB — RETICULOCYTES
RBC.: 2.65 MIL/uL — ABNORMAL LOW (ref 3.87–5.11)
RETIC CT PCT: 5.5 % — AB (ref 0.4–3.1)
Retic Count, Absolute: 145.8 10*3/uL (ref 19.0–186.0)

## 2015-01-20 LAB — URINE CULTURE: Culture: NO GROWTH

## 2015-01-20 LAB — PREPARE RBC (CROSSMATCH)

## 2015-01-20 LAB — LACTATE DEHYDROGENASE: LDH: 355 U/L — AB (ref 98–192)

## 2015-01-20 LAB — HEMOGLOBIN A1C
Hgb A1c MFr Bld: <4 % — ABNORMAL LOW (ref 4.8–5.6)
Mean Plasma Glucose: <68 mg/dL

## 2015-01-20 LAB — ABO/RH: ABO/RH(D): O NEG

## 2015-01-20 MED ORDER — PIPERACILLIN-TAZOBACTAM 3.375 G IVPB
3.3750 g | Freq: Three times a day (TID) | INTRAVENOUS | Status: DC
  Administered 2015-01-20 – 2015-01-21 (×3): 3.375 g via INTRAVENOUS
  Filled 2015-01-20 (×6): qty 50

## 2015-01-20 MED ORDER — SODIUM CHLORIDE 0.9 % IV SOLN
90.0000 mg | Freq: Once | INTRAVENOUS | Status: AC
  Administered 2015-01-21: 90 mg via INTRAVENOUS
  Filled 2015-01-20: qty 10

## 2015-01-20 MED ORDER — INSULIN ASPART 100 UNIT/ML ~~LOC~~ SOLN: 0.0000 [IU] | Freq: Four times a day (QID) | SUBCUTANEOUS | Status: DC

## 2015-01-20 MED ORDER — VITAMIN K1 10 MG/ML IJ SOLN
5.0000 mg | Freq: Once | INTRAMUSCULAR | Status: AC
  Administered 2015-01-20: 5 mg via SUBCUTANEOUS
  Filled 2015-01-20: qty 1

## 2015-01-20 MED ORDER — SODIUM CHLORIDE 0.9 % IV SOLN
INTRAVENOUS | Status: DC
  Administered 2015-01-20: 17:00:00 via INTRAVENOUS

## 2015-01-20 MED ORDER — SODIUM CHLORIDE 0.9 % IV SOLN
Freq: Once | INTRAVENOUS | Status: AC
  Administered 2015-01-20: 19:00:00 via INTRAVENOUS

## 2015-01-20 MED ORDER — MORPHINE SULFATE (CONCENTRATE) 10 MG/0.5ML PO SOLN
2.5000 mg | ORAL | Status: DC | PRN
  Administered 2015-01-20 (×2): 2.6 mg via ORAL
  Filled 2015-01-20 (×2): qty 0.5

## 2015-01-20 MED ORDER — SENNOSIDES-DOCUSATE SODIUM 8.6-50 MG PO TABS: 2.0000 | ORAL_TABLET | Freq: Two times a day (BID) | ORAL | Status: DC

## 2015-01-20 MED ORDER — BISACODYL 10 MG RE SUPP: 10.0000 mg | Freq: Every day | RECTAL | Status: DC | PRN

## 2015-01-20 NOTE — Progress Notes (Signed)
ANTICOAGULATION CONSULT NOTE - follow up  Pharmacy Consult for Warfarin Indication: stroke  Allergies  Allergen Reactions  . Ultram [Tramadol] Shortness Of Breath   Patient Measurements: Height: 5\' 6"  (167.6 cm) Weight: 219 lb 5.7 oz (99.5 kg) IBW/kg (Calculated) : 59.3  Vital Signs: Temp: 98.9 F (37.2 C) (08/31 0552) Temp Source: Axillary (08/31 0552) BP: 127/61 mmHg (08/31 0552) Pulse Rate: 107 (08/31 0552)  Labs:  Recent Labs  01/18/15 1945 01/18/15 1949 01/19/15 0545 01/20/15 0630  HGB 7.1* 7.8* 7.5* 6.9*  HCT 22.4* 23.0* 23.9* 22.1*  PLT 40*  --  37* 29*  LABPROT 49.0*  --  50.7* 83.0*  INR 5.65*  --  5.87* 11.30*  CREATININE 0.60 0.60 0.59  --    Estimated Creatinine Clearance: 74.5 mL/min (by C-G formula based on Cr of 0.59).  Medical History: Past Medical History  Diagnosis Date  . CHF (congestive heart failure)   . Diabetes mellitus without complication   . COPD (chronic obstructive pulmonary disease)   . Hypertension   . Asthma   . Shortness of breath   . Anxiety   . Pneumonia   . Gout   . 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 Anticoagulant long-term use   . Hx of echocardiogram 12/2014    EF 65-70%  . 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 Thrombocytopenia   . 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 Charcot foot due to diabetes mellitus    Medications:  Prescriptions prior to admission  Medication Sig Dispense Refill Last Dose  . atorvastatin (LIPITOR) 40 MG tablet TAKE ONE (1) TABLET EACH DAY 30 tablet 0 Past Week at Unknown time  . gabapentin (NEURONTIN) 100 MG capsule Take 1 capsule (100 mg total) by mouth at bedtime. 30 capsule 0 Past Week at Unknown time  . metoprolol tartrate (LOPRESSOR) 25 MG tablet TAKE 1/2 TABLET TWICE DAILY 30 tablet 2 Past Week at Unknown time  . montelukast (SINGULAIR) 10 MG tablet TAKE 1 TABLET DAILY IN THE EVENING 30 tablet 1 Past  Week at Unknown time   Assessment: Okay for Protocol , INR elevated, currently on "hold".   H/H low, thrombocytopenia noted.  D/W Dr Sarajane Jews, he ordered Vitamin K 5mg  SQ x 1 due to elevated INR.  Goal of Therapy:  INR 2-3  Plan:  No warfarin today  F/U labs, INR daily F/U [facility] plan  Hart Robinsons A 01/20/2015,12:58 PM

## 2015-01-20 NOTE — Progress Notes (Signed)
unable to give 1400 zosyn dose due to loss of IV access.  IV access re-obtained.  Per Nicki Reaper, pharmacist, ok to give 1400 dose now, running at at rate of 161ml/hr and run over 30 min.  Nicki Reaper will retime the next dose.

## 2015-01-20 NOTE — Telephone Encounter (Signed)
Order protime as soon as home health is able to do

## 2015-01-20 NOTE — Progress Notes (Addendum)
PROGRESS NOTE  Brittany Clarke WUJ:[id number] DOB: 1940/11/10 DOA: 01/18/2015 PCP: [name], MD  Summary: 69 yow PMH stroke, maintained on warfarin, brought to ED for third time in less than 3 weeks; presented with FTT, no oral intake for several days. Found to have fever, code sepsis was called, treated with broad abx. Admitted for FTT, no clear source of infection. Other issues included high INR secondary to warfarin.    Previously admitted 8/12-8/20 for AMS, AKI, fever. AMS resolved, thought secondary to dehydration and AKI, also considered opiate effect and depression. Fever without a source with negative evaluation including BC, UC, CXR, LE venous doppler u/s, 2-d echocardiogram.    Admitted 8/22-8/26 for unresponsive state/acute encephalopathy, dehydration; family demanded transfer to [facility] but [place] declined and patient was transferred to [facility], seen by neurology. ABG, ammonia, MRI brain unrevealing. Neurology conclusion dementia with delirum secondary secondary to alteration of sleep/wake cycle and change of location. Eventually improved, SNF recommended but family declined and pt went home with [facility]. Discharge meds included neurontin. No BZD or narcotics.   Assessment/Plan: 1. Fever, possible sepsis on admission with elevated lactic acid 2.13 >> WNL with fluids. Initial workup was unrevealing (CXR, U/a) and abx were discontinued 8/30. Remains afebrile. Cultures pending.  2. FTT/recurrent acute encephalopathy. Etiology unclear, third admission in 3 weeks. Previously pain medication considered, but not on any since last discharge. Both recurrent episodes occurred in context of FTT with 72 hours of discharge. CT negative 8/12 and 8/22. MRI brain 8/23--no acute process. Recent EEG non-specific, evaluated by neurology on last admission.  3. Supratherapeutic INR on admission, warfarin-induced coagulopathy (dx: stroke),  INR >10 on admission. Suspect related to  antibiotics. 4. Anemia/thrombocytopenia. Concern for MDS. Per GI consult 8/14: "Patient evaluated for dysphagia and hematochezia 2014. Colonoscopy demonstrated prominent diverticulosis with some suspicion for diverticular etiology but no other findings. EGD for dysphagia revealed H. 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 Anemia panel revealed a total iron of 31, TIBC 265, normal vitamin B12. TSH WNL. 5. Hypercalcemia, modest. Possibly related to dehydration, check ionized calcium. Does have a history of breast cancer. 6. Elevated T bili, AST; mild generalized abd pain, some RUQ pain, plan as below. Seen since 8/12 admission. Significance unclear. 7. Dysphagia. Continue NPO. Per ST 8/26: No overt s/s aspiration or difficulty with P.O. Intake of thin liquids and feel that patient is appropriate to be upgraded from nectar thick liquids to thin liquids. Patient is still not ready to have puree solids added to meal, and will need to continue to trial this with SLP, secondary to mod-severe oral dysphagia and delays in transit and swallow initiation of purees. 8. DM type 2 with peripheral neuropathy, Charcot foot. CBG stable. Well-controlled. 9. COPD, stable. 10. S/p right breast cancer 11. S/p CVA 2013 with right sided weakness on warfarin as an outpatient. 12. Grade 1 diastolic CHF 13. Chronic constipation 14. Depression? In chart 8/22 Dr. Wilson Singer documented family report that pt told daughter she was tired of living. 15. Enlarged bilateral level II cervical lymph nodes seen on MRI brain, unclear significance.   Appears ill, prognosis guarded. Afebrile and VSS. Abd exam benign and non-focal. Abd u/s nonspecific, doubt acute cholecystitis. Survival in doubt, prognosis appears poor, suspect developing MDS type process as primary process. Hypercalcemia likely dehydration rather than related  to MDS. Given age, comorbidities and current illness would recommend DNR and conservative measures.  She has had extensive evaluation over the last 3 weeks. Appreciate PMT. Will d/w with multiple family members for [place] and treatment options.  Will start abx and monitor, consider further evaluation of gallbladder based on final GOC. Continue IVF  IM vitamin K, trend INR; no evidence of bleeding  Trend CBC, consult hematology, transfuse PRBC if family gives consent  CMP in AM, check ionized calcium  Consult ST  ADDENDUM Discussed with 2 daughters at bedside, [name] and Levada Dy. We reviewed previous hospitalizations, all previous testing as well as testing this admission. We discussed A/P in detail as above and discussed guarded prognosis. They expressed understanding and understand their mother may not survive.  Code Status: Full DVT prophylaxis: SCDs Family Communication: Sharyn Lull, niece bedside, interested in palliative care/hospice but not POA (no POA in family). 4 children.  Disposition Plan: Pending goals of care.  Murray Hodgkins, MD  [facility]  Pager [phone] If 7PM-7AM, please contact night-coverage at [email or web address], password [facility] 01/20/2015, 6:58 AM  LOS: 2 days   Consultants:  PMT  Procedures:    Antibiotics:  Vancomycin 8/29>>8/30  HPI/Subjective: SOB, nausea but no witnessed vomiting.   Niece reports that the patient had a rough night. She seemed to be agitated and have pain, nonspecific.  Objective: Filed Vitals:   01/19/15 1224 01/19/15 2211 01/20/15 0240 01/20/15 0552  BP: 138/84 101/42 102/48 127/61  Pulse: 106 94 103 107  Temp: 99.1 F (37.3 C) 99.3 F (37.4 C) 98.5 F (36.9 C) 98.9 F (37.2 C)  TempSrc: Axillary Axillary Axillary Axillary  Resp: 24 21 15 17   Height:      Weight:      SpO2: 98% 96% 100% 100%    Intake/Output Summary (Last 24 hours) at 01/20/15 0658 Last data filed at 01/20/15 0500  Gross per 24 hour  Intake    945  ml  Output    450 ml  Net    495 ml     Filed Weights   01/18/15 1755 01/18/15 2240  Weight: 113.399 kg (250 lb) 99.5 kg (219 lb 5.7 oz)    Exam:  Afebrile General: Appears ill but not toxic. Lying in bed calm. Eyes: PERRL, normal lids, irises & conjunctiva ENT: grossly normal hearing, lips. White patches on the tongue. Opens mouth to command.  Neck: no LAD, masses or thyromegaly Cardiovascular: RRR, no m/r/g. 2+ BLE edema Telemetry: SR with brief run of SVT Respiratory: CTAB. Mild increased respiratory effort. No nasal flaring or retractions noted. No w/r/r. Abdomen: soft, mild generalized tenderness to palpation.  Skin: no rash or induration noted. Bruising in LLE. No petechiae noted  Musculoskeletal: Moves LUE and LLE to command. RUE and RLE flaccid Psychiatric: Difficult to access speech and mood. Oriented to self only. Follows simple commands Foley catheter noted   New data reviewed:  INR >10 >> 5.87 >> 11.3  Hgb (baseline 10) 7.1 >> 7.5 >> 6.9  Plts (first low 12/11/2014) 20 >> 40 >> 37 >>29  Normal WBC  Abd U/S Abundant sludge within the gallbladder. Small calculi. Areas of focal wall thickening may reflect areas of sludge adherent to the gallbladder wall or inflammation. No Murphy sign.2. Mild hepatic steatosis  Pertinent data :  Lactic acid 2.13 >. 1.6  EEG 8/23 previous admission Interpretation: This EEG study is abnormal with moderately severe generalized continuous nonspecific slowing of cerebral activity. Triphasic sharp waves are most often seen with moderate to severe metabolic encephalopathy, particularly with hepatic and renal failure, but can be  seen with other metabolic conditions as well. No evidence of epileptic activity was recorded.  CXR 8/29 IMPRESSION: Suboptimal inspiration accounts for bibasilar atelectasis, left greater than right. No acute cardiopulmonary disease otherwise.  Pending data:  BC  UC  Scheduled Meds: . gabapentin   100 mg Oral QHS  . insulin aspart  0-9 Units Subcutaneous TID WC  . metoprolol tartrate  12.5 mg Oral BID  . montelukast  10 mg Oral QPM  . potassium chloride  40 mEq Oral Once  . Warfarin - Pharmacist Dosing Inpatient   Does not apply Q24H   Continuous Infusions: . dextrose 5 % and 0.9% NaCl 100 mL/hr at 01/19/15 1502    Principal Problem:   Failure to thrive in adult Active Problems:   History of CVA (cerebrovascular accident)   Anemia   DM II (diabetes mellitus, type II), controlled   History of breast cancer   Charcot foot due to diabetes mellitus   Thrombocytopenia   Acute encephalopathy   Warfarin-induced coagulopathy   Fever   Hypercalcemia   Elevated LFTs   Dysphagia   Time spent 45 minutes, >50 counseling and coordination of care  .Sandi Raveling Leonie Green, acting as scribe, recorded this note contemporaneously in the presence of Dr. Melene Plan. Sarajane Jews, M.D. on 01/20/2015 .  I have reviewed the above documentation for accuracy and completeness, and I agree with the above. Murray Hodgkins, MD

## 2015-01-20 NOTE — Progress Notes (Addendum)
Daily Progress Note   Patient Name: Brittany Clarke       Date: 01/20/2015 DOB: 23-Dec-1940  Age: 74 y.o. MRN#: [id number] Attending Physician: Samuella Cota, MD Primary Care Physician: [name], MD Admit Date: 01/18/2015  Reason for Consultation/Follow-up: Establishing goals of care and Psychosocial/spiritual support  Subjective: Mrs. Rawlinson smiles at me when I greet her today. She tries to communicate but has difficulty speaking clearly.  Her niece, Brittany Clarke states she feels that Mrs. Galer is in pain and that she moaned and moved all night.  Daughter, Brittany Clarke enters and we begin our discussion.  CM, Christinia Gully begins by asking about feeding tube placement.  We discuss the associated risks,  such as surgical placement, aspiration pne, and body image disturbance;  also that Mrs. Briddell is not strong enough (abnormal labs) at this time for PEG.  We talk about Mrs. Schiele's low Hbg and platelets, her left weakness, and difficulty talking/swallowing.  Brittany Clarke states that 1 month ago, Mrs. Charlie was walking, but not cooking, and went to [facility] for PT/rehab, but agrees this did not help her mobility or self care.  We talk about options after hospitalization, such as SNF as resident with Medicaid, Home with [place] vs Hospice, and Hospice Home.  When Hospice home is mentioned Mrs. Million turns her head away.  Brittany Clarke states her goal is to take Mrs. Branca to her home.  I share the importance of having help and support (home health vs Hospice) and that Mrs. Druckenmiller will need 24/7 caregivers.  Brittany Clarke states they have been paying for caregivers at night.  I share my concerns about Mrs. Sheer's limited ability to help turn now d/t left weakness.  We discuss the increased likelihood of PNE, UTI, skin breakdown (already noted on bl heels).  I share the chronic illness trajectory with Brittany Clarke and [employer].  We also talk about code status and the reality of chest compressions, shocking, and intubation.   I share my concerns that if Mrs. Sowell were to be so ill as to need this that she would not likely have a good outcome.  We discussed the concept of "allow a natural death".  Brittany Clarke is unable to make this decision alone, therefore Mrs. Wiehe remains a full code.  Brittany Clarke is tearful during our discussion and we encourage her that medicine is doing all we can for Mrs. Francisco at this time.  Brittany Clarke talks about needing to have this discussion with her sisters, Brittany Clarke (LPN), and Brittany Clarke ( Margarita Mail), and her brother, Brittany Clarke.  There is another brother but he is out of state.   I ask Brittany Clarke what worries her most, she states "that my mother will need help that I cant give her".   We discuss pain management and Brittany Clarke agrees to the use of liquid Morphine to help with pain.  We set up a family conference 9/1 at 3:30pm.    Length of Stay: 2 days  Current Medications: Scheduled Meds:  . insulin aspart  0-9 Units Subcutaneous Q6H  . montelukast  10 mg Oral QPM  . Warfarin - Pharmacist Dosing Inpatient   Does not apply Q24H    Continuous Infusions: . dextrose 5 % and 0.9% NaCl 100 mL/hr at 01/19/15 1502    PRN Meds: acetaminophen **OR** acetaminophen, ondansetron **OR** ondansetron (ZOFRAN) IV  Palliative Performance Scale: 20%     Vital Signs: BP 127/61 mmHg  Pulse 107  Temp(Src) 98.9 F (37.2 C) (Axillary)  Resp 17  Ht  5\' 6"  (1.676 m)  Wt 99.5 kg (219 lb 5.7 oz)  BMI 35.42 kg/m2  SpO2 100% SpO2: SpO2: 100 % O2 Device: O2 Device: Not Delivered O2 Flow Rate: O2 Flow Rate (L/min): 2 L/min  Intake/output summary:  Intake/Output Summary (Last 24 hours) at 01/20/15 1222 Last data filed at 01/20/15 0500  Gross per 24 hour  Intake    945 ml  Output    450 ml  Net    495 ml   LBM:   Baseline Weight: Weight: 113.399 kg (250 lb) Most recent weight: Weight: 99.5 kg (219 lb 5.7 oz)  Physical Exam: Constitutional:  Elderly, frail, obese.  Appears ill.   Resp: Even, but labored.    GI:  Abd soft, obese, tender  Extremities:  Right upper grip 0/5; left upper grip 2/5; left lower leg 2/5; right lower leg 2/5               Additional Data Reviewed: Recent Labs     01/18/15  1949  01/19/15  0545  01/20/15  0630  WBC   --   10.6*  7.7  HGB  7.8*  7.5*  6.9*  PLT   --   37*  29*  NA  143  143   --   BUN  18  18   --   CREATININE  0.60  0.59   --      Problem List:  Patient Active Problem List   Diagnosis Date Noted  . Failure to thrive in adult 01/18/2015  . UTI (lower urinary tract infection) 01/11/2015  . AKI (acute kidney injury) 01/08/2015  . Hypomagnesemia 01/06/2015  . Elevated INR 01/06/2015  . Warfarin-induced coagulopathy 01/06/2015  . Acute encephalopathy 01/04/2015  . FUO (fever of unknown origin) 01/04/2015  . Blood in stool   . Altered mental status 01/01/2015  . Dehydration 01/01/2015  . Thrombocytopenia 12/28/2014  . Chronic anticoagulation 12/15/2014  . Osteopenia 12/02/2014  . Unspecified vitamin D deficiency 07/30/2013  . Right hemiplegia 06/25/2013  . Muscle weakness (generalized) 06/25/2013  . Need for prophylactic vaccination and inoculation against influenza 04/16/2013  . 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 Esophageal dysphagia 02/11/2013  . Long term current use of anticoagulant therapy 01/24/2013  . Rectal bleeding 01/24/2013  . Chronic constipation 12/02/2012  . Pedal edema 12/02/2012  . Otalgia of left ear 12/02/2012  . History of rectal bleeding 12/02/2012  . HLD (hyperlipidemia) 12/02/2012  . Dysuria 12/02/2012  . Pain in joint, ankle and foot 09/10/2012  . PNA (pneumonia) 06/10/2012  . Hypokalemia 06/10/2012  . CHF (congestive heart failure) 06/10/2012  . Morbid obesity 06/10/2012  . History of CVA (cerebrovascular accident) 06/10/2012  . Anemia 06/10/2012  . DDD (degenerative disc disease), lumbar 06/10/2012  . Allergic rhinitis 06/10/2012  . Gout 06/10/2012  . HTN (hypertension) 06/10/2012   . DM II (diabetes mellitus, type II), controlled 06/10/2012  . Constipation 06/10/2012  . Urinary incontinence 06/10/2012  . History of breast cancer 06/10/2012  . COPD (chronic obstructive pulmonary disease) 06/10/2012     Palliative Care Assessment & Plan    Code Status:  Full code   We discussed the ideas of "allow a natural death", and Hospice both in patient and a home.   Goals of Care:  No decision regarding goal at this time.  Today's meeting with Brittany Clarke, scheduled family meeting 8/31 at 3:30 pm. Explained the importance of Majority decision.   Symptom Management:  Morphine 2.5  mg PO/SL Q 4 hours PRN.   Palliative Prophylaxis:  Senna-S 2 tabs PO BID.   Dulcolax suppository PR QD if unable to take PO.   Psycho-social/Spiritual:  Desire for further Chaplaincy support:  Not discussed today.    Prognosis: < 6 months Discharge Planning: Undecided at this time, but daughter, Brittany Clarke, states she wants to take Mrs. Barnsdall home.   We discussed the importance of Hospice for support.   Care plan was discussed with CM, SW, and Dr. Sarajane Jews.   Thank you for allowing the Palliative Medicine Team to assist in the care of this patient.   Time In:  1115 Time Out: 1215 Total time 60 minutes Prolonged Time Billed  no     Greater than 50%  of this time was spent counseling and coordinating care related to the above assessment and plan.   Drue Novel, NP  01/20/2015, 12:22 PM  Please contact Palliative Medicine Team phone at [phone] for questions and concerns.

## 2015-01-20 NOTE — Care Management Note (Signed)
Case Management Note  Patient Details  Name: Brittany Clarke MRN: [id number] Date of Birth: 12/15/1940  Subjective/Objective:                    Action/Plan:   Expected Discharge Date:                  Expected Discharge Plan:  Home w Hospice Care  In-House Referral:  Hospice / Palliative Care  Discharge planning Services  CM Consult  Post Acute Care Choice:    Choice offered to:     DME Arranged:    DME Agency:     HH Arranged:    HH Agency:     Status of Service:  In process, will continue to follow  Medicare Important Message Given:    Date Medicare IM Given:    Medicare IM give by:    Date Additional Medicare IM Given:    Additional Medicare Important Message give by:     If discussed at Long Length of Stay Meetings, dates discussed:    Additional Comments: CM met with pts daughter, Patricia, and Palliative Care NP, Tosha, at pts bedside to discuss goals of care, peg tubes, DNR. Patricia to discuss with the 2 sisters and 2 brothers and family conference will be held on 01/21/15 at 3:30. Will continue to follow for discharge planning needs. Shaindel Sweeten Crowder, RN 01/20/2015, 12:51 PM  

## 2015-01-20 NOTE — Progress Notes (Signed)
ANTIBIOTIC CONSULT NOTE   Pharmacy Consult for Zosyn  Indication: intra-abdominal infection  Allergies  Allergen Reactions  . Ultram [Tramadol] Shortness Of Breath   Patient Measurements: Height: 5\' 6"  (167.6 cm) Weight: 219 lb 5.7 oz (99.5 kg) IBW/kg (Calculated) : 59.3  Vital Signs: Temp: 98.9 F (37.2 C) (08/31 0552) Temp Source: Axillary (08/31 0552) BP: 127/61 mmHg (08/31 0552) Pulse Rate: 107 (08/31 0552) Intake/Output from previous day: 08/30 0701 - 08/31 0700 In: 945 [I.V.:945] Out: 450 [Urine:450] Intake/Output from this shift:    Labs:  Recent Labs  01/18/15 1945 01/18/15 1949 01/19/15 0545 01/20/15 0630  WBC 8.3  --  10.6* 7.7  HGB 7.1* 7.8* 7.5* 6.9*  PLT 40*  --  37* 29*  CREATININE 0.60 0.60 0.59  --    Estimated Creatinine Clearance: 74.5 mL/min (by C-G formula based on Cr of 0.59). No results for input(s): VANCOTROUGH, VANCOPEAK, VANCORANDOM, GENTTROUGH, GENTPEAK, GENTRANDOM, TOBRATROUGH, TOBRAPEAK, TOBRARND, AMIKACINPEAK, AMIKACINTROU, AMIKACIN in the last 72 hours.   Microbiology: Recent Results (from the past 720 hour(s))  C difficile quick scan w PCR reflex     Status: None   Collection Time: 01/01/15  3:06 PM  Result Value Ref Range Status   C Diff antigen NEGATIVE NEGATIVE Final   C Diff toxin NEGATIVE NEGATIVE Final   C Diff interpretation Negative for toxigenic C. difficile  Final  MRSA PCR Screening     Status: None   Collection Time: 01/02/15  9:30 PM  Result Value Ref Range Status   MRSA by PCR NEGATIVE NEGATIVE Final    Comment:        The GeneXpert MRSA Assay (FDA approved for NASAL specimens only), is one component of a comprehensive MRSA colonization surveillance program. It is not intended to diagnose MRSA infection nor to guide or monitor treatment for MRSA infections.   Culture, blood (x 2)     Status: None   Collection Time: 01/04/15  7:28 AM  Result Value Ref Range Status   Specimen Description BLOOD LEFT HAND   Final   Special Requests BOTTLES DRAWN AEROBIC AND ANAEROBIC 5CC  Final   Culture NO GROWTH 5 DAYS  Final   Report Status 01/09/2015 FINAL  Final  Culture, blood (x 2)     Status: None   Collection Time: 01/04/15  7:30 AM  Result Value Ref Range Status   Specimen Description BLOOD LEFT WRIST  Final   Special Requests BOTTLES DRAWN AEROBIC AND ANAEROBIC 5CC  Final   Culture NO GROWTH 5 DAYS  Final   Report Status 01/09/2015 FINAL  Final  Urine culture     Status: None   Collection Time: 01/04/15  3:15 PM  Result Value Ref Range Status   Specimen Description URINE, CATHETERIZED  Final   Special Requests NONE  Final   Culture   Final    NO GROWTH 2 DAYS Performed at [facility]    Report Status 01/06/2015 FINAL  Final  Rapid strep screen (not at [facility])     Status: None   Collection Time: 01/06/15  3:20 PM  Result Value Ref Range Status   Streptococcus, Group A Screen (Direct) NEGATIVE NEGATIVE Final    Comment: (NOTE) A Rapid Antigen test may result negative if the antigen level in the sample is below the detection level of this test. The FDA has not cleared this test as a stand-alone test therefore the rapid antigen negative result has reflexed to a Group A Strep culture.  Culture, Group A Strep     Status: None   Collection Time: 01/06/15  3:20 PM  Result Value Ref Range Status   Strep A Culture Negative  Final    Comment: (NOTE) Performed At: [facility] [place], [place] [id number] [name] MD AS:[id number]   Urine culture     Status: None   Collection Time: 01/11/15  1:41 PM  Result Value Ref Range Status   Specimen Description URINE, CATHETERIZED  Final   Special Requests NONE  Final   Culture   Final    NO GROWTH 2 DAYS Performed at [facility]    Report Status 01/13/2015 FINAL  Final  Urine culture     Status: None   Collection Time: 01/11/15  7:00 PM  Result Value Ref Range Status   Specimen Description URINE,  RANDOM  Final   Special Requests NONE  Final   Culture CANCELLED BY LAB DUPLICATE REQUEST  Final   Report Status 01/13/2015 FINAL  Final  Blood Culture (routine x 2)     Status: None (Preliminary result)   Collection Time: 01/18/15  6:00 PM  Result Value Ref Range Status   Specimen Description BLOOD LEFT HAND DRAWN BY RN  Final   Special Requests BOTTLES DRAWN AEROBIC ONLY 4CC  Final   Culture NO GROWTH 2 DAYS  Final   Report Status PENDING  Incomplete  Blood Culture (routine x 2)     Status: None (Preliminary result)   Collection Time: 01/18/15  6:10 PM  Result Value Ref Range Status   Specimen Description BLOOD LEFT ANTECUBITAL  Final   Special Requests BOTTLES DRAWN AEROBIC ONLY 6CC  Final   Culture NO GROWTH 2 DAYS  Final   Report Status PENDING  Incomplete  Urine culture     Status: None   Collection Time: 01/18/15  6:56 PM  Result Value Ref Range Status   Specimen Description URINE, CATHETERIZED  Final   Special Requests NONE  Final   Culture   Final    NO GROWTH 1 DAY Performed at [facility]    Report Status 01/20/2015 FINAL  Final    Anti-infectives    Start     Dose/Rate Route Frequency Ordered Stop   01/20/15 1400  piperacillin-tazobactam (ZOSYN) IVPB 3.375 g     3.375 g 12.5 mL/hr over 240 Minutes Intravenous Every 8 hours 01/20/15 1256     01/19/15 1000  vancomycin (VANCOCIN) IVPB 1000 mg/200 mL premix  Status:  Discontinued     1,000 mg 200 mL/hr over 60 Minutes Intravenous Every 12 hours 01/18/15 2159 01/19/15 1013   01/19/15 0200  piperacillin-tazobactam (ZOSYN) IVPB 3.375 g  Status:  Discontinued     3.375 g 12.5 mL/hr over 240 Minutes Intravenous Every 8 hours 01/18/15 2159 01/19/15 1013   01/18/15 2200  vancomycin (VANCOCIN) IVPB 1000 mg/200 mL premix     1,000 mg 200 mL/hr over 60 Minutes Intravenous  Once 01/18/15 2159 01/19/15 0103   01/18/15 1745  piperacillin-tazobactam (ZOSYN) IVPB 3.375 g     3.375 g 100 mL/hr over 30 Minutes Intravenous   Once 01/18/15 1733 01/18/15 1844   01/18/15 1745  vancomycin (VANCOCIN) IVPB 1000 mg/200 mL premix     1,000 mg 200 mL/hr over 60 Minutes Intravenous  Once 01/18/15 1733 01/18/15 1938     Assessment: 74 yo female with obesity.  Renal fxn OK.  Anemia noted.  Asked to initiate Zosyn for intra-abdominal infection.  Pt was previously on Vanc and Zosyn for suspected sepsis.    Goal of Therapy:  Eradicate infection.  Plan:  Zosyn 3.375gm IV every 8 hours. Follow-up micro data, labs, vitals.   [place] Crane, Shykeem Resurreccion A 01/20/2015,1:00 PM

## 2015-01-20 NOTE — Evaluation (Signed)
Clinical/Bedside Swallow Evaluation Patient Details  Name: Brittany Clarke MRN: [id number] Date of Birth: 1941/04/09  Today's Date: 01/20/2015 Time: SLP Start Time (ACUTE ONLY): 1316 SLP Stop Time (ACUTE ONLY): 1400 SLP Time Calculation (min) (ACUTE ONLY): 44 min  Past Medical History:  Past Medical History  Diagnosis Date  . CHF (congestive heart failure)   . Diabetes mellitus without complication   . COPD (chronic obstructive pulmonary disease)   . Hypertension   . Asthma   . Shortness of breath   . Anxiety   . Pneumonia   . Gout   . 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 Anticoagulant long-term use   . Hx of echocardiogram 12/2014    EF 65-70%  . 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 Thrombocytopenia   . 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 Charcot foot due to diabetes mellitus    Past Surgical History:  Past Surgical History  Procedure Laterality Date  . Tubal ligation    . Colonoscopy  04/04/2002    HTD:SKAJGOTL hemorrhoids, otherwise, normal rectum/Scattered left sided diverticula/Somewhat adenomatous appearing ileocecal valve which is probably normal but this area was biopsied. The remainder of the colonic mucosa appeared   normal  . Trigger finger surgery      twice  . Colonoscopy with esophagogastroduodenoscopy (egd) N/A 02/20/2013    Procedure: COLONOSCOPY WITH ESOPHAGOGASTRODUODENOSCOPY (EGD);  Surgeon: Daneil Dolin, MD;  Location: [facility];  Service: Endoscopy;  Laterality: N/A;  . Breast surgery Left 2005    lumpectomy and removed lymph nodes   HPI:  Brittany Clarke is a 74 yo woman who has had multiple hospitalizations in the last several weeks for FTT. She was discharged from [facility] on Friday on a full, thin liquid diet (but also sent home with thickener) and readmitted yesterday to [facility]. She met with palliative care team at [facility] and again here today at [facility]. She  has a past medical history of CHF (congestive heart failure); Diabetes mellitus without complication; COPD (chronic obstructive pulmonary disease); Hypertension; Asthma; Shortness of breath; Anxiety; Pneumonia; Gout; Cancer; Anticoagulant long-term use; echocardiogram (12/2014); Stroke (2013); Thrombocytopenia; Encephalopathy (8/22-8/26/2016 admission); and Charcot foot due to diabetes mellitus. Extensively worked up last admission. Neurology consulted and feels likely dementia with delirium secondary to alteration of sleep/wake cycle and change of location.Most recent chest x-ray showed: Suboptimal inspiration accounts for bibasilar atelectasis, left greater than right. No acute cardiopulmonary disease otherwise. SLP asked to evaluate swallow.   Assessment / Plan / Recommendation Clinical Impression  Pt alert and verbalizes unintelligibly. She needed mod/max encouragement for po trials this date. Daughter reports that she was discharged home from [facility] and only "tasted" foods over the weekend. She indicated that she choked on water because she was holding it in her mouth at home. Oral motor examination could not be completed due to decreased cognition in patient. She presents with primary oral phase cognitive based dysphagia in setting of dementia characterized by prolonged oral transit and oral holding of bolus. Pt swallowed ~30 ml water and 2 small bites of sherbet over 30 minutes. Pt assessed both with and without a straw. Family advised to refrain from use of straw during previous hospitalization, however I would encourage controlled use when presented by caregiver (pinch straw and/or remove in timely fashion). She did better with suck, swallow sequence when using a straw this  date. Swallow precautions cognitive based dysphagia information relayed to daughter who verbalized understanding. SLP explained that po trials/water should be offered but not forced upon patient when she is alert and upright to  reduce risks of aspiration. Daughter understood that "we could not make" her mother eat/swallow. It is doubtful that pt will accept po medications at this time- would likely hold in mouth and eventually swallow or expectorate. Recommend NPO with allowance for sips of water and/or ice chips when presented by trained caregiver when pt is alert and upright. Encourage oral care. Above to Dr. Sarajane Jews and pt's daughter who is in agreement. SLP will follow.     Aspiration Risk  Moderate    Diet Recommendation NPO;Free water protocol after oral care   Medication Administration: Crushed with puree (unlikely to accept meds at this time, but could attempt if MD wishes)    Other  Recommendations Oral Care Recommendations: Oral care QID   Follow Up Recommendations       Frequency and Duration min 2x/week  1 week   Pertinent Vitals/Pain VSS    SLP Swallow Goals  SLP to follow for goals of care   Swallow Study Prior Functional Status       General Date of Onset: 01/18/15 Other Pertinent Information: Brittany Clarke is a 75 yo woman who has had multiple hospitalizations in the last several weeks for FTT. She was discharged from [facility] on Friday on a full, thin liquid diet (but also sent home with thickener) and readmitted yesterday to [facility]. She met with palliative care team at [facility] and again here today at [facility]. She has a past medical history of CHF (congestive heart failure); Diabetes mellitus without complication; COPD (chronic obstructive pulmonary disease); Hypertension; Asthma; Shortness of breath; Anxiety; Pneumonia; Gout; Cancer; Anticoagulant long-term use; echocardiogram (12/2014); Stroke (2013); Thrombocytopenia; Encephalopathy (8/22-8/26/2016 admission); and Charcot foot due to diabetes mellitus. Extensively worked up last admission. Neurology consulted and feels likely dementia with delirium secondary to alteration of sleep/wake cycle and change of location.Most recent chest x-ray  showed: Suboptimal inspiration accounts for bibasilar atelectasis, left greater than right. No acute cardiopulmonary disease otherwise. SLP asked to evaluate swallow. Type of Study: Bedside swallow evaluation Previous Swallow Assessment: previous admission at [facility] 01/14/2015 NTL Diet Prior to this Study: NPO Temperature Spikes Noted: No (febrile upon admission, but now afebrile) Respiratory Status: Supplemental O2 delivered via (comment) History of Recent Intubation: No Behavior/Cognition: Cooperative;Alert;Requires cueing Oral Cavity - Dentition: Edentulous Self-Feeding Abilities: Total assist Patient Positioning: Upright in bed Baseline Vocal Quality: Normal Volitional Cough: Cognitively unable to elicit Volitional Swallow: Unable to elicit    Oral/Motor/Sensory Function Overall Oral Motor/Sensory Function: Other (comment) (but unwilling/unable to follow directions for OME; decreased cognition) Labial ROM: Within Functional Limits   Ice Chips Ice chips: Impaired Presentation: Spoon Oral Phase Impairments: Poor awareness of bolus Oral Phase Functional Implications: Prolonged oral transit;Oral holding Pharyngeal Phase Impairments: Suspected delayed Swallow   Thin Liquid Thin Liquid: Impaired Presentation: Cup;Straw (hand over hand presentation) Oral Phase Impairments: Reduced lingual movement/coordination Oral Phase Functional Implications: Prolonged oral transit;Oral holding Pharyngeal  Phase Impairments: Suspected delayed Swallow    Nectar Thick Nectar Thick Liquid: Impaired Presentation: Spoon Oral Phase Impairments: Reduced lingual movement/coordination;Poor awareness of bolus Oral phase functional implications: Prolonged oral transit;Oral holding Pharyngeal Phase Impairments: Suspected delayed Swallow   Honey Thick Honey Thick Liquid: Not tested   Puree Puree: Not tested   Solid   Thank you,  Genene Churn, [place]  Solid: Not tested        PORTER,DABNEY 01/20/2015,2:47 PM

## 2015-01-20 NOTE — Telephone Encounter (Signed)
Pt is back in hospital, advanced not with her unless they get new order when she returns home. Hospital states, Hospice appropriate, but no decisions yet

## 2015-01-20 NOTE — Telephone Encounter (Signed)
Left message for Brittany Clarke, advanced nurse to call me back

## 2015-01-20 NOTE — Progress Notes (Signed)
CRITICAL VALUE ALERT  Critical value received:  HBG 6.9, Platelet 29, INR 11.3  Date of notification:  01/20/15  Time of notification:  0740  Critical value read back:Yes.    Nurse who received alert:  Geanie Berlin  MD notified (1st page):  Dr. Sarajane Jews  Time of first page:  0745  MD notified (2nd page):  Time of second page:  Responding MD:  Dr. Sarajane Jews  Time MD responded:  [phone]  Dr. Sarajane Jews responded that he received critical lab values and will review patient's chart.  No new orders received at this time.  Will continue to monitor patient.

## 2015-01-20 NOTE — Consult Note (Addendum)
[facility] Consultation Oncology  Name: Brittany Clarke      MRN: [id number]    Location: Y650/P546-56  Date: 01/20/2015 Time:4:49 PM   REFERRING PHYSICIAN:  Murray Hodgkins, MD  REASON FOR CONSULT:  Thrombocytopenia and anemia   DIAGNOSIS:  Thrombocytopenia dating back to July 2016 and normocytic, normochromic anemia dating back to at least Jan 2014, below baseline July/August 2016.  HISTORY OF PRESENT ILLNESS:  Brittany Clarke is a 74 year old woman with a past medical history significant for CHF, morbid obesity, H/O CVA, DDD, HTN, DM II, H/O breast cancer, COPD, hyperlipidemia, right hemiplegia, chronic anticoagulation, and failure to thrive who reported to the [facility] ED on  01/18/2015 with fever of unknown origin and failure to thrive who was noted to have anemia and thrombocytopenia during hospitalization resulting in hematology consultation.  I personally reviewed and went over laboratory results with the patient.  The results are noted within this dictation.  I personally reviewed and went over radiographic studies with the patient.  The results are noted within this dictation.    Chart reviewed.  Of note: this current hospitalization marks the patient's 3rd hospitalization in 3 weeks.  Family was at the bedside, 2 great nieces, 1 daughter, and 1 sister.  I reviewed the patient's case in great detail regarding this current hospitalization.  I reviewed her lab work and past lab work associated with her anemia and thrombocytopenia.  Thus far, work-up has been unimpressive.  Family at the bedside this evening are very realistic and reasonable.  They believe the patient should be a DNR and move on with Hospice, but there are 1-2 children who disagree (they were not present this evening).  The patient's daughter (who is a Marine scientist) will work with her siblings and try to help them understand the patient's situation.  Here's the story:  The patient had a CVA 3 years ago.  She has been  on anticoagulation since, with Coumadin.  As a result of the CVA, she has right hemiplegia.  For 3 years and up until late this summer, the patient was a maximum assist of 1 at home requiring assistance with feeding, gait, and transfer.  She was continent of bowel and bladder.  And then she went to [facility] for rehabilitation (PT) for 20+ days.  Near the end of her stay, she was noted to be not doing well at the [facility] resulting in the 1st of her 3 hospitalizations of late.  Since then, she has continued to decline.  Family denies any new complaints.  They deny weight loss since Christmas 2015.  They denies any changes to their knowledge.  The patient is unable to participate in conversation.  She does have a history of breast cancer, on the right.  It was treated with lumpectomy, followed by radiation, followed by 5 years of endocrine therapy.  Her breast cancer diagnosis was 13 + years ago, the family reports.  Since her debility, she has not had mammograms.  I reviewed the issues at hand.  I explained the role of the bone marrow in the production of blood products, namely WBC, RBC, and platelets.  The bone marrow requires ingredients to make cells, particularly RBCs.  If these ingredients are lacking, we can develop an anemia.  There are some circumstances in which reactively, thrombocytopenia occurs.  Additional, cytopenis can occur reactively to infections, medication, chronic ailments, liver disease, etc.    I relayed my role in her care in the  hospital.  I discussed options regarding her work-up, which depends on the patient/family's goals of care.  There is clearly a divide.   History is unobtainable from patient.  PAST MEDICAL HISTORY:   Past Medical History  Diagnosis Date  . CHF (congestive heart failure)   . Diabetes mellitus without complication   . COPD (chronic obstructive pulmonary disease)   . Hypertension   . Asthma   . Shortness of breath   . Anxiety   .  Pneumonia   . Gout   . 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 Anticoagulant long-term use   . Hx of echocardiogram 12/2014    EF 65-70%  . 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 Thrombocytopenia   . 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 Charcot foot due to diabetes mellitus     ALLERGIES: Allergies  Allergen Reactions  . Ultram [Tramadol] Shortness Of Breath      MEDICATIONS: I have reviewed the patient's current medications.     PAST SURGICAL HISTORY Past Surgical History  Procedure Laterality Date  . Tubal ligation    . Colonoscopy  04/04/2002    RMR:Internal hemorrhoids, otherwise, normal rectum/Scattered left sided diverticula/Somewhat adenomatous appearing ileocecal valve which is probably normal but this area was biopsied. The remainder of the colonic mucosa appeared   normal  . Trigger finger surgery      twice  . Colonoscopy with esophagogastroduodenoscopy (egd) N/A 02/20/2013    Procedure: COLONOSCOPY WITH ESOPHAGOGASTRODUODENOSCOPY (EGD);  Surgeon: Robert M Rourk, MD;  Location: [facility];  Service: Endoscopy;  Laterality: N/A;  . Breast surgery Left 2005    lumpectomy and removed lymph nodes    FAMILY HISTORY: Family History  Problem Relation Age of Onset  . Colon cancer Neg Hx   . Liver disease Neg Hx   . Heart disease Mother   . Hypertension Mother   . Cancer Father     lung  . Cancer Brother     prostate  . Diabetes Brother   . Diabetes Sister   . Cancer Sister     breast cancer  . Hypertension Sister   . Diabetes Sister   . Hypertension Sister   . Diabetes Sister   . Hypertension Sister   . Hypertension Sister   . Cancer Brother   . Diabetes Brother   . Diabetes Brother     SOCIAL HISTORY:  reports that she has never smoked. She has never used smokeless tobacco. She reports that she does not drink alcohol or use illicit drugs.  PERFORMANCE STATUS: The  patient's performance status is 4 - Bedbound  PHYSICAL EXAM: Most Recent Vital Signs: Blood pressure 127/61, pulse 107, temperature 98.9 F (37.2 C), temperature source Axillary, resp. rate 17, height 5' 6" (1.676 m), weight 219 lb 5.7 oz (99.5 kg), SpO2 100 %. General appearance: appears stated age, fatigued, mild distress, morbidly obese, slowed mentation and uncooperative Head: Normocephalic, without obvious abnormality, atraumatic Neck: no adenopathy, supple, symmetrical, trachea midline and adenopathy exam hindered due to patient positioning Lungs: anteriorly clear Breasts: left breast without any masses or lesions, or nipple changes.  Right breast with a healed lumpectomy scar without any nipple/areolar changes, frank masses or lesion, but with noted soft and mobile fibroglandular tissues and thickened skin at lumpectomy site. Heart: regular rate and rhythm Abdomen: soft, non-tender; bowel sounds normal; no masses,  no   organomegaly Extremities: no edema, redness or tenderness in the calves or thighs and bilateral 1 + pitting edema in lower extremitites Skin: Skin color, texture, turgor normal. No rashes or lesions Lymph nodes: No supraclavicular adenopathy, adenopathy exam hindered by patient positioning. Neurologic: Mental status: alertness: obtunded  LABORATORY DATA:  Results for orders placed or performed during the hospital encounter of 01/18/15 (from the past 48 hour(s))  I-Stat CG4 Lactic Acid, ED  (not at  [facility])     Status: Abnormal   Collection Time: 01/18/15  5:50 PM  Result Value Ref Range   Lactic Acid, Venous 2.13 (HH) 0.5 - 2.0 mmol/L   Comment NOTIFIED PHYSICIAN   CBC WITH DIFFERENTIAL     Status: Abnormal   Collection Time: 01/18/15  5:50 PM  Result Value Ref Range   WBC 4.3 4.0 - 10.5 K/uL   RBC 1.33 (L) 3.87 - 5.11 MIL/uL   Hemoglobin 3.5 (LL) 12.0 - 15.0 g/dL    Comment: RESULT REPEATED AND VERIFIED CRITICAL RESULT CALLED TO, READ BACK BY AND VERIFIED  WITH: TITLLEY,M AT 1950 ON [id number] BY ISLEY,B    HCT 11.5 (L) 36.0 - 46.0 %   MCV 86.5 78.0 - 100.0 fL   MCH 26.3 26.0 - 34.0 pg   MCHC 30.4 30.0 - 36.0 g/dL   RDW 23.7 (H) 11.5 - 15.5 %   Platelets 20 (LL) 150 - 400 K/uL    Comment: RESULT REPEATED AND VERIFIED CRITICAL RESULT CALLED TO, READ BACK BY AND VERIFIED WITH: TITLLEY,M AT 1950 ON 8/2982016 BY ISLEY,B    Neutrophils Relative % 49 43 - 77 %   Neutro Abs 2.1 1.7 - 7.7 K/uL   Lymphocytes Relative 24 12 - 46 %   Lymphs Abs 1.0 0.7 - 4.0 K/uL   Monocytes Relative 25 (H) 3 - 12 %   Monocytes Absolute 1.1 (H) 0.1 - 1.0 K/uL   Eosinophils Relative 1 0 - 5 %   Eosinophils Absolute 0.0 0.0 - 0.7 K/uL   Basophils Relative 1 0 - 1 %   Basophils Absolute 0.0 0.0 - 0.1 K/uL   RBC Morphology POLYCHROMASIA PRESENT    Smear Review SPECIMEN CHECKED FOR CLOTS     Comment: PLATELET COUNT CONFIRMED BY SMEAR LARGE PLATELETS PRESENT   Protime-INR     Status: Abnormal   Collection Time: 01/18/15  5:50 PM  Result Value Ref Range   Prothrombin Time >90.0 (H) 11.6 - 15.2 seconds    Comment: REPEATED TO VERIFY QUESTIONABLE RESULTS, RECOMMEND RECOLLECT TO VERIFY    INR >10.00 (HH) 0.00 - 1.49    Comment: REPEATED TO VERIFY QUESTIONABLE RESULTS, RECOMMEND RECOLLECT TO VERIFY RN NOTIFIED PRIOR TO MY ARRIVAL WITH REPEAT ORDERS AND NEW DRAW PLACED FOR ALL TESTS FORSYTH K AT [place] ON [id number]   Blood Culture (routine x 2)     Status: None (Preliminary result)   Collection Time: 01/18/15  6:00 PM  Result Value Ref Range   Specimen Description BLOOD LEFT HAND DRAWN BY RN    Special Requests BOTTLES DRAWN AEROBIC ONLY 4CC    Culture NO GROWTH 2 DAYS    Report Status PENDING   Blood Culture (routine x 2)     Status: None (Preliminary result)   Collection Time: 01/18/15  6:10 PM  Result Value Ref Range   Specimen Description BLOOD LEFT ANTECUBITAL    Special Requests BOTTLES DRAWN AEROBIC ONLY 6CC    Culture NO GROWTH 2 DAYS    Report Status  PENDING  Urinalysis, Routine w reflex microscopic (not at [facility])     Status: Abnormal   Collection Time: 01/18/15  6:56 PM  Result Value Ref Range   Color, Urine AMBER (A) YELLOW    Comment: BIOCHEMICALS MAY BE AFFECTED BY COLOR   APPearance HAZY (A) CLEAR   Specific Gravity, Urine >1.030 (H) 1.005 - 1.030   pH 5.5 5.0 - 8.0   Glucose, UA NEGATIVE NEGATIVE mg/dL   Hgb urine dipstick NEGATIVE NEGATIVE   Bilirubin Urine MODERATE (A) NEGATIVE   Ketones, ur 15 (A) NEGATIVE mg/dL   Protein, ur 30 (A) NEGATIVE mg/dL   Urobilinogen, UA 1.0 0.0 - 1.0 mg/dL   Nitrite NEGATIVE NEGATIVE   Leukocytes, UA NEGATIVE NEGATIVE  Urine culture     Status: None   Collection Time: 01/18/15  6:56 PM  Result Value Ref Range   Specimen Description URINE, CATHETERIZED    Special Requests NONE    Culture      NO GROWTH 1 DAY Performed at [facility]    Report Status 01/20/2015 FINAL   Urine microscopic-add on     Status: Abnormal   Collection Time: 01/18/15  6:56 PM  Result Value Ref Range   Squamous Epithelial / LPF MANY (A) RARE   WBC, UA 3-6 <3 WBC/hpf   RBC / HPF 0-2 <3 RBC/hpf   Bacteria, UA FEW (A) RARE   Casts GRANULAR CAST (A) NEGATIVE   Urine-Other YEAST   Sample to Blood Bank     Status: None   Collection Time: 01/18/15  7:45 PM  Result Value Ref Range   Blood Bank Specimen SAMPLE AVAILABLE FOR TESTING    Sample Expiration 01/21/2015   CBC with Differential/Platelet     Status: Abnormal   Collection Time: 01/18/15  7:45 PM  Result Value Ref Range   WBC 8.3 4.0 - 10.5 K/uL   RBC 2.60 (L) 3.87 - 5.11 MIL/uL   Hemoglobin 7.1 (L) 12.0 - 15.0 g/dL    Comment: DELTA CHECK NOTED REDRAW    HCT 22.4 (L) 36.0 - 46.0 %   MCV 86.2 78.0 - 100.0 fL   MCH 27.3 26.0 - 34.0 pg   MCHC 31.7 30.0 - 36.0 g/dL   RDW 23.7 (H) 11.5 - 15.5 %   Platelets 40 (L) 150 - 400 K/uL    Comment: REPEATED TO VERIFY SPECIMEN CHECKED FOR CLOTS    Neutrophils Relative % 50 43 - 77 %   Neutro Abs 4.1  1.7 - 7.7 K/uL   Lymphocytes Relative 26 12 - 46 %   Lymphs Abs 2.1 0.7 - 4.0 K/uL   Monocytes Relative 23 (H) 3 - 12 %   Monocytes Absolute 1.9 (H) 0.1 - 1.0 K/uL   Eosinophils Relative 1 0 - 5 %   Eosinophils Absolute 0.1 0.0 - 0.7 K/uL   Basophils Relative 0 0 - 1 %   Basophils Absolute 0.0 0.0 - 0.1 K/uL   WBC Morphology ATYPICAL LYMPHOCYTES    RBC Morphology TARGET CELLS     Comment: ELLIPTOCYTES STOMATOCYTES BASOPHILIC STIPPLING POLYCHROMASIA PRESENT    Smear Review PLATELETS APPEAR DECREASED   Comprehensive metabolic panel     Status: Abnormal   Collection Time: 01/18/15  7:45 PM  Result Value Ref Range   Sodium 142 135 - 145 mmol/L   Potassium 3.4 (L) 3.5 - 5.1 mmol/L   Chloride 113 (H) 101 - 111 mmol/L   CO2 25 22 - 32 mmol/L   Glucose, Bld 121 (  H) 65 - 99 mg/dL   BUN 20 6 - 20 mg/dL   Creatinine, Ser 0.60 0.44 - 1.00 mg/dL   Calcium 10.5 (H) 8.9 - 10.3 mg/dL   Total Protein 4.8 (L) 6.5 - 8.1 g/dL   Albumin 2.4 (L) 3.5 - 5.0 g/dL   AST 44 (H) 15 - 41 U/L   ALT 13 (L) 14 - 54 U/L   Alkaline Phosphatase 58 38 - 126 U/L   Total Bilirubin 2.4 (H) 0.3 - 1.2 mg/dL   GFR calc non Af Amer >60 >60 mL/min   GFR calc Af Amer >60 >60 mL/min    Comment: (NOTE) The eGFR has been calculated using the CKD EPI equation. This calculation has not been validated in all clinical situations. eGFR's persistently <60 mL/min signify possible Chronic Kidney Disease.    Anion gap 4 (L) 5 - 15  Protime-INR     Status: Abnormal   Collection Time: 01/18/15  7:45 PM  Result Value Ref Range   Prothrombin Time 49.0 (H) 11.6 - 15.2 seconds   INR 5.65 (HH) 0.00 - 1.49    Comment: REPEATED TO VERIFY CRITICAL RESULT CALLED TO, READ BACK BY AND VERIFIED WITH: VICK C AT 2025 ON [id number] BY FORSYTH K   Hemoglobin A1c     Status: Abnormal   Collection Time: 01/18/15  7:45 PM  Result Value Ref Range   Hgb A1c MFr Bld <4.0 (L) 4.8 - 5.6 %    Comment: (NOTE) **Verified by repeat analysis**          Pre-diabetes: 5.7 - 6.4         Diabetes: >6.4         Glycemic control for adults with diabetes: <7.0    Mean Plasma Glucose <68 mg/dL    Comment: (NOTE) Performed At: [facility] [address] [place], [place] [id number] [name] MD QZ:[id number]   I-stat troponin, ED     Status: None   Collection Time: 01/18/15  7:48 PM  Result Value Ref Range   Troponin i, poc 0.00 0.00 - 0.08 ng/mL   Comment 3            Comment: Due to the release kinetics of cTnI, a negative result within the first hours of the onset of symptoms does not rule out myocardial infarction with certainty. If myocardial infarction is still suspected, repeat the test at appropriate intervals.   I-stat Chem 8, ED     Status: Abnormal   Collection Time: 01/18/15  7:49 PM  Result Value Ref Range   Sodium 143 135 - 145 mmol/L   Potassium 3.4 (L) 3.5 - 5.1 mmol/L   Chloride 109 101 - 111 mmol/L   BUN 18 6 - 20 mg/dL   Creatinine, Ser 0.60 0.44 - 1.00 mg/dL   Glucose, Bld 119 (H) 65 - 99 mg/dL   Calcium, Ion 1.66 (H) 1.13 - 1.30 mmol/L   TCO2 23 0 - 100 mmol/L   Hemoglobin 7.8 (L) 12.0 - 15.0 g/dL   HCT 23.0 (L) 36.0 - 46.0 %  I-Stat CG4 Lactic Acid, ED     Status: None   Collection Time: 01/18/15  7:59 PM  Result Value Ref Range   Lactic Acid, Venous 1.60 0.5 - 2.0 mmol/L  Glucose, capillary     Status: Abnormal   Collection Time: 01/18/15 10:49 PM  Result Value Ref Range   Glucose-Capillary 107 (H) 65 - 99 mg/dL  CBC     Status:  Abnormal   Collection Time: 01/19/15  5:45 AM  Result Value Ref Range   WBC 10.6 (H) 4.0 - 10.5 K/uL   RBC 2.75 (L) 3.87 - 5.11 MIL/uL   Hemoglobin 7.5 (L) 12.0 - 15.0 g/dL   HCT 23.9 (L) 36.0 - 46.0 %   MCV 86.9 78.0 - 100.0 fL   MCH 27.3 26.0 - 34.0 pg   MCHC 31.4 30.0 - 36.0 g/dL   RDW 23.9 (H) 11.5 - 15.5 %   Platelets 37 (L) 150 - 400 K/uL    Comment: SPECIMEN CHECKED FOR CLOTS PLATELET COUNT CONFIRMED BY SMEAR   Comprehensive metabolic panel      Status: Abnormal   Collection Time: 01/19/15  5:45 AM  Result Value Ref Range   Sodium 143 135 - 145 mmol/L   Potassium 3.4 (L) 3.5 - 5.1 mmol/L   Chloride 112 (H) 101 - 111 mmol/L   CO2 25 22 - 32 mmol/L   Glucose, Bld 114 (H) 65 - 99 mg/dL   BUN 18 6 - 20 mg/dL   Creatinine, Ser 0.59 0.44 - 1.00 mg/dL   Calcium 10.9 (H) 8.9 - 10.3 mg/dL   Total Protein 5.1 (L) 6.5 - 8.1 g/dL   Albumin 2.4 (L) 3.5 - 5.0 g/dL   AST 52 (H) 15 - 41 U/L   ALT 15 14 - 54 U/L   Alkaline Phosphatase 67 38 - 126 U/L   Total Bilirubin 2.4 (H) 0.3 - 1.2 mg/dL   GFR calc non Af Amer >60 >60 mL/min   GFR calc Af Amer >60 >60 mL/min    Comment: (NOTE) The eGFR has been calculated using the CKD EPI equation. This calculation has not been validated in all clinical situations. eGFR's persistently <60 mL/min signify possible Chronic Kidney Disease.    Anion gap 6 5 - 15  Protime-INR     Status: Abnormal   Collection Time: 01/19/15  5:45 AM  Result Value Ref Range   Prothrombin Time 50.7 (H) 11.6 - 15.2 seconds   INR 5.87 (HH) 0.00 - 1.49    Comment: RESULT REPEATED AND VERIFIED CRITICAL RESULT CALLED TO, READ BACK BY AND VERIFIED WITH: EXT 4526 NURSE S MCKINNEY AT [phone] [id number] BY E AGUNDIZ   Glucose, capillary     Status: Abnormal   Collection Time: 01/19/15  7:40 AM  Result Value Ref Range   Glucose-Capillary 110 (H) 65 - 99 mg/dL   Comment 1 Notify RN    Comment 2 Document in Chart   Glucose, capillary     Status: Abnormal   Collection Time: 01/19/15 11:09 AM  Result Value Ref Range   Glucose-Capillary 129 (H) 65 - 99 mg/dL   Comment 1 Notify RN    Comment 2 Document in Chart   Glucose, capillary     Status: Abnormal   Collection Time: 01/19/15  4:39 PM  Result Value Ref Range   Glucose-Capillary 118 (H) 65 - 99 mg/dL   Comment 1 Notify RN    Comment 2 Document in Chart   Glucose, capillary     Status: Abnormal   Collection Time: 01/19/15 10:10 PM  Result Value Ref Range   Glucose-Capillary  119 (H) 65 - 99 mg/dL   Comment 1 Notify RN    Comment 2 Document in Chart   Protime-INR     Status: Abnormal   Collection Time: 01/20/15  6:30 AM  Result Value Ref Range   Prothrombin Time 83.0 (H) 11.6 - 15.2 seconds  INR 11.30 (HH) 0.00 - 1.49    Comment: RESULT REPEATED AND VERIFIED CRITICAL RESULT CALLED TO, READ BACK BY AND VERIFIED WITH: NURSE Pamala Duffel _0  [id number] BY E AGUNDIZ X.[id number]   CBC     Status: Abnormal   Collection Time: 01/20/15  6:30 AM  Result Value Ref Range   WBC 7.7 4.0 - 10.5 K/uL   RBC 2.48 (L) 3.87 - 5.11 MIL/uL   Hemoglobin 6.9 (LL) 12.0 - 15.0 g/dL    Comment: RESULT REPEATED AND VERIFIED CRITICAL RESULT CALLED TO, READ BACK BY AND VERIFIED WITH: NURSE M FERGUSON _1  [id number] BY E AGUNDIZ X.4526    HCT 22.1 (L) 36.0 - 46.0 %   MCV 89.1 78.0 - 100.0 fL   MCH 27.8 26.0 - 34.0 pg   MCHC 31.2 30.0 - 36.0 g/dL   RDW 24.3 (H) 11.5 - 15.5 %   Platelets 29 (LL) 150 - 400 K/uL    Comment: RESULT REPEATED AND VERIFIED CRITICAL RESULT CALLED TO, READ BACK BY AND VERIFIED WITH: NURSE Pamala Duffel _2  [id number] BY E AGUNDIZ X.[id number]   Glucose, capillary     Status: Abnormal   Collection Time: 01/20/15  7:47 AM  Result Value Ref Range   Glucose-Capillary 120 (H) 65 - 99 mg/dL  Glucose, capillary     Status: Abnormal   Collection Time: 01/20/15 11:17 AM  Result Value Ref Range   Glucose-Capillary 118 (H) 65 - 99 mg/dL   Comment 1 Notify RN   Reticulocytes     Status: Abnormal   Collection Time: 01/20/15  3:58 PM  Result Value Ref Range   Retic Ct Pct 5.5 (H) 0.4 - 3.1 %   RBC. 2.65 (L) 3.87 - 5.11 MIL/uL   Retic Count, Manual 145.8 19.0 - 186.0 K/uL  Glucose, capillary     Status: None   Collection Time: 01/20/15  4:08 PM  Result Value Ref Range   Glucose-Capillary 95 65 - 99 mg/dL   Comment 1 Notify RN    Comment 2 Document in Chart       RADIOGRAPHY: Dg Chest Port 1 View  01/18/2015   CLINICAL DATA:  Low grade fever and shortness of breath,  acute onset earlier today. Code sepsis. Current history of diabetes, hypertension, CHF, asthma and COPD. Personal history of left breast cancer.  EXAM: PORTABLE CHEST - 1 VIEW  COMPARISON:  01/11/2015 and earlier.  FINDINGS: Suboptimal inspiration accounts for crowded bronchovascular markings diffusely and atelectasis in the bases, left greater than right, and accentuates the cardiac silhouette. Taking this into account, cardiac silhouette mildly enlarged, unchanged. Thoracic aorta tortuous and atherosclerotic, unchanged. Hilar and mediastinal contours otherwise unremarkable. Lungs otherwise clear. No confluent airspace consolidation. No visible pleural effusions. Surgical clips in the right axilla from prior node dissection.  IMPRESSION: Suboptimal inspiration accounts for bibasilar atelectasis, left greater than right. No acute cardiopulmonary disease otherwise.   Electronically Signed   By: Evangeline Dakin M.D.   On: 01/18/2015 18:11   Dg Abd Portable 1v  01/20/2015   CLINICAL DATA:  Abdominal pain.  Elevated LFTs.  EXAM: PORTABLE ABDOMEN - 1 VIEW  COMPARISON:  12/17/2014  FINDINGS: Bowel gas pattern is nonobstructed. Tortuous versus aneurysmal abdominal aorta. No evidence for organomegaly. Temperature probe in place overlying the region of the rectum. Degenerative changes are seen in the spine.  IMPRESSION: Nonobstructive bowel gas pattern.   Electronically Signed   By: Nolon Nations M.D.   On: 01/20/2015 14:31   US Abdomen Limited  Ruq  01/20/2015   CLINICAL DATA:  Elevated liver function tests  EXAM: US ABDOMEN LIMITED - RIGHT UPPER QUADRANT  COMPARISON:  None.  FINDINGS: Gallbladder:  Large volume of sludge. Numerous small gallstones. The largest appears to measure about 4 mm. Gallbladder wall thickness measures primarily 2 mm throughout most of the gallbladder but there is a portion of the gallbladder wall but measures about 5 mm.No sonographic Murphy sign.  Common bile duct:  Diameter: 4.5 mm   Liver:  Mildly increased echogenicity diffusely with no focal abnormalities. No biliary dilatation.  IMPRESSION: 1. Abundant sludge within the gallbladder. Small calculi. Areas of focal wall thickening may reflect areas of sludge adherent to the gallbladder wall or inflammation. No Murphy sign. 2. Mild hepatic steatosis.   Electronically Signed   By: Skipper Cliche M.D.   On: 01/20/2015 10:22       PATHOLOGY:  None   ASSESSMENT:  1. Normocytic, normochromic anemia, etiology unclear at this time.  Last EGD and colonoscopy in 2014.  Positive stool card on 12/02/2014 for blood. 2. Thrombocytopenia, below baseline.  Etiology unclear at this moment. 3. Hypercalcemia, thought to be secondary to dehydration, dating back to her previous hospitalization. 4. H/O breast cancer, dx > 13 years ago and treated with left lumpectomy, radiation therapy, and 5 years of endocrine therapy. 5. Failure to thrive 6. H/O CVA with right hemiplegia, on chronic Vitamin K antagonist with significantly supratherapeutic INR at the beginning of current hospitalization requiring Vit K SQ.  Patient Active Problem List   Diagnosis Date Noted  . Fever 01/20/2015  . Hypercalcemia 01/20/2015  . Elevated LFTs 01/20/2015  . Dysphagia 01/20/2015  . Palliative care encounter 01/20/2015  . Weakness generalized 01/20/2015  . Altered mental status   . Failure to thrive in adult 01/18/2015  . UTI (lower urinary tract infection) 01/11/2015  . AKI (acute kidney injury) 01/08/2015  . Hypomagnesemia 01/06/2015  . Elevated INR 01/06/2015  . Warfarin-induced coagulopathy 01/06/2015  . Acute encephalopathy 01/04/2015  . FUO (fever of unknown origin) 01/04/2015  . Blood in stool   . Dehydration 01/01/2015  . Thrombocytopenia 12/28/2014  . Chronic anticoagulation 12/15/2014  . Osteopenia 12/02/2014  . Unspecified vitamin D deficiency 07/30/2013  . Right hemiplegia 06/25/2013  . Muscle weakness (generalized) 06/25/2013  . Need for  prophylactic vaccination and inoculation against influenza 04/16/2013  . 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 Esophageal dysphagia 02/11/2013  . Rectal bleeding 01/24/2013  . Chronic constipation 12/02/2012  . Pedal edema 12/02/2012  . Otalgia of left ear 12/02/2012  . History of rectal bleeding 12/02/2012  . HLD (hyperlipidemia) 12/02/2012  . Dysuria 12/02/2012  . Pain in joint, ankle and foot 09/10/2012  . PNA (pneumonia) 06/10/2012  . CHF (congestive heart failure) 06/10/2012  . Morbid obesity 06/10/2012  . History of CVA (cerebrovascular accident) 06/10/2012  . Anemia 06/10/2012  . DDD (degenerative disc disease), lumbar 06/10/2012  . Gout 06/10/2012  . HTN (hypertension) 06/10/2012  . DM II (diabetes mellitus, type II), controlled 06/10/2012  . Urinary incontinence 06/10/2012  . History of breast cancer 06/10/2012  . COPD (chronic obstructive pulmonary disease) 06/10/2012    PLAN:  I have ordered a host of lab work, specifically for anemia and thrombocytopenia work-up including: pathology smear review, SPEP + IFE, B2M, LDH, ESR, CRP, anemia panel, Retic count, Haptoglobin, Epo level, HIV antibody, and hepatitis panel.  Her persistent hypercalcemia, despite hydration is of concern to me, particularly since she has a  history of breast cancer.  Hypercalcemia actually pre-dates this hospitalization and is noted in lab work from previous hospital stay.  This is new as of 01/11/2015.  I do not see any imaging studies at this time to evaluate this finding from a malignancy standpoint.  Her corrected calcium today is 12.2.  Therefore, I will order Pamidronate 90 mg today.  We can repeat later this week or beginning of next week depending on response.  I suspect some of her mental status change may be related to hypercalcemia.    Differential of anemia and thrombocytopenia is large.  Primary bone marrow disorder is certainly a consideration and would require bone marrow  aspiration and biopsy to confirm/rule out.    I had a frank conversation with the family today who were present.  I recommended a DNR, and those present agreed.  There is 1-2 offspring that disagree with this.  I discussed future investigation options.  The family present today want some easy investigative studies performed, without putting the patient through invasive procedures at this time.  I discussed the progression of my work-up which will include the aforementioned lab work.  Depending on lab results, consideration to CT CAP to evaluate for malignancy (due to hypercalcemia, history of breast cancer, and failure to thrive) and/or bone marrow aspiration and biopsy (to evaluate for primary bone marrow disorder) can be pursued.  HOWEVER, we typically perform tests because we will pursue the results and treat if abnormality is noted.  The reason I would pursue CT imaging is to evaluate for malignancy.  If malignancy was discovered, systemic treatment would be indicated, and myself and the family agree that she is unlikely to be a candidate for systemic chemotherapy.  Additionally, a bone marrow aspiration and biopsy would be performed to look for a primary bone marrow disorder.  If one is found, treatment would also include systemic therapy and she is not a candidate for this intervention either.  Family present in the room this evening agree.  We discussed code status.  She remains a full code, but hopefully that will change in the near future.  There is 1-2 siblings who are unrealistic and unreasonable.  Family is going to work on changing that.  Hospice was discussed and all family members present were in agreement to that intervention, BUT there still remains unrealistic family members as mentioned above.  There is no POA and the patient is unable to make her own medical decisions at this time.    From a hematology perspective, we will review lab results above and depending on result, we will make  further recommendations.  Causes of cytopenia include, but not limited to, GI bleed, antibiotics, elevated INR, primary bone marrow disorder, vitamin deficiency, malignancy, and more.  With any luck, labs drawn today will help guide future decision making.  Hopefully with pamidronate, the patient's mental status improves as hypercalcemia resolves and maybe she can help guide her own medical decisions at that time.  I am concerned about her persistent hypercalcemia and the possibility of malignancy and/or primary bone marrow disorder.  Either of which, she is not likely a candidate for treatment given her significant co-morbidities.   At any time, hospice would be appropriate.  Other recommendations include maintaining a Hgb of 8.5 g/dL or greater with PRBCs without special preparation requirements.  Also, maintain a platelet count of 20,000 or greater (or for any active bleeding) with pheresed platelets, again without any special preparation requirements.  It sounds like   the patient's home situation is less than ideal.  She lives with family members who are unrealistic and unequipped to care for the patient at home.  They do have an aide that helps at home, but it is clear that the aide is causing some grief among family members.  Going home again is not in the patient's best interest in my opinion.   All questions were answered. The patient knows to call the clinic with any problems, questions or concerns. We can certainly see the patient much sooner if necessary.  Patient and plan discussed with Dr. Ancil Linsey and she is in agreement with the aforementioned.   Doy Mince 01/20/2015 6:26 PM   Patient's chart is reviewed. She is a hospice candidate. The causes of her pancytopenia are multifactorial. In addition for adequate evaluation she would also need a BMBX and I do not feel that is appropriate for her given her comorbidities. The cause of her hypercalcemia is not completely clear.  Agree with pamidronate, but again I do not feel strongly about an aggressive w/u given her comorbidities. Will await further family input before additional recommendations are made. Donald Pore MD

## 2015-01-20 NOTE — Progress Notes (Signed)
Patient's niece stated that patient has not taken in anything PO.  Dr. Sarajane Jews notified, requested metoprolol be IV instead of PO.

## 2015-01-20 NOTE — Telephone Encounter (Signed)
Pt back in hospital.

## 2015-01-21 DIAGNOSIS — R1310 Dysphagia, unspecified: Secondary | ICD-10-CM

## 2015-01-21 DIAGNOSIS — Z515 Encounter for palliative care: Secondary | ICD-10-CM

## 2015-01-21 DIAGNOSIS — Z7189 Other specified counseling: Secondary | ICD-10-CM | POA: Insufficient documentation

## 2015-01-21 DIAGNOSIS — D649 Anemia, unspecified: Secondary | ICD-10-CM

## 2015-01-21 DIAGNOSIS — E119 Type 2 diabetes mellitus without complications: Secondary | ICD-10-CM

## 2015-01-21 LAB — BILIRUBIN, DIRECT: Bilirubin, Direct: 1 mg/dL — ABNORMAL HIGH (ref 0.1–0.5)

## 2015-01-21 LAB — CBC
HEMATOCRIT: 24.7 % — AB (ref 36.0–46.0)
HEMOGLOBIN: 7.8 g/dL — AB (ref 12.0–15.0)
MCH: 28.3 pg (ref 26.0–34.0)
MCHC: 31.6 g/dL (ref 30.0–36.0)
MCV: 89.5 fL (ref 78.0–100.0)
Platelets: 19 10*3/uL — CL (ref 150–400)
RBC: 2.76 MIL/uL — AB (ref 3.87–5.11)
RDW: 22.7 % — ABNORMAL HIGH (ref 11.5–15.5)
WBC: 8.6 10*3/uL (ref 4.0–10.5)

## 2015-01-21 LAB — COMPREHENSIVE METABOLIC PANEL
ALBUMIN: 2.2 g/dL — AB (ref 3.5–5.0)
ALK PHOS: 76 U/L (ref 38–126)
ALT: 19 U/L (ref 14–54)
AST: 67 U/L — AB (ref 15–41)
Anion gap: 6 (ref 5–15)
BILIRUBIN TOTAL: 2.3 mg/dL — AB (ref 0.3–1.2)
BUN: 16 mg/dL (ref 6–20)
CALCIUM: 11 mg/dL — AB (ref 8.9–10.3)
CO2: 25 mmol/L (ref 22–32)
CREATININE: 0.69 mg/dL (ref 0.44–1.00)
Chloride: 119 mmol/L — ABNORMAL HIGH (ref 101–111)
GFR calc Af Amer: >60 mL/min (ref >60)
GLUCOSE: 108 mg/dL — AB (ref 65–99)
POTASSIUM: 3.4 mmol/L — AB (ref 3.5–5.1)
Sodium: 150 mmol/L — ABNORMAL HIGH (ref 135–145)
TOTAL PROTEIN: 4.8 g/dL — AB (ref 6.5–8.1)

## 2015-01-21 LAB — PROTIME-INR
INR: 2.19 — AB (ref 0.00–1.49)
Prothrombin Time: 24.1 seconds — ABNORMAL HIGH (ref 11.6–15.2)

## 2015-01-21 LAB — PROTEIN ELECTROPHORESIS, SERUM
A/G Ratio: 0.9 (ref 0.7–1.7)
ALBUMIN ELP: 2.2 g/dL — AB (ref 2.9–4.4)
ALPHA-1-GLOBULIN: 0.3 g/dL (ref 0.0–0.4)
Alpha-2-Globulin: 0.5 g/dL (ref 0.4–1.0)
BETA GLOBULIN: 0.9 g/dL (ref 0.7–1.3)
GAMMA GLOBULIN: 0.8 g/dL (ref 0.4–1.8)
Globulin, Total: 2.5 g/dL (ref 2.2–3.9)
TOTAL PROTEIN ELP: 4.7 g/dL — AB (ref 6.0–8.5)

## 2015-01-21 LAB — IMMUNOFIXATION ELECTROPHORESIS
IGM, SERUM: 51 mg/dL (ref 26–217)
IgA: 303 mg/dL (ref 64–422)
IgG (Immunoglobin G), Serum: 833 mg/dL (ref 700–1600)
TOTAL PROTEIN ELP: 4.8 g/dL — AB (ref 6.0–8.5)

## 2015-01-21 LAB — FERRITIN: Ferritin: 4378 ng/mL — ABNORMAL HIGH (ref 11–307)

## 2015-01-21 LAB — GLUCOSE, CAPILLARY
GLUCOSE-CAPILLARY: 96 mg/dL (ref 65–99)
Glucose-Capillary: 101 mg/dL — ABNORMAL HIGH (ref 65–99)

## 2015-01-21 LAB — FOLATE: FOLATE: 7.1 ng/mL (ref >5.9)

## 2015-01-21 LAB — IRON AND TIBC
IRON: 27 ug/dL — AB (ref 28–170)
Saturation Ratios: 14 % (ref 10.4–31.8)
TIBC: 196 ug/dL — ABNORMAL LOW (ref 250–450)
UIBC: 169 ug/dL

## 2015-01-21 LAB — C-REACTIVE PROTEIN: CRP: 19.1 mg/dL — ABNORMAL HIGH (ref <1.0)

## 2015-01-21 LAB — VITAMIN B12: VITAMIN B 12: 559 pg/mL (ref 180–914)

## 2015-01-21 MED ORDER — MORPHINE SULFATE 10 MG/5ML PO SOLN: 2.5000 mg | ORAL | Status: DC | PRN

## 2015-01-21 MED ORDER — MORPHINE SULFATE (CONCENTRATE) 10 MG/0.5ML PO SOLN
2.5000 mg | ORAL | Status: DC | PRN
  Administered 2015-01-21 – 2015-01-22 (×4): 2.6 mg via ORAL
  Filled 2015-01-21 (×4): qty 0.5

## 2015-01-21 MED ORDER — MORPHINE SULFATE (PF) 2 MG/ML IV SOLN
2.0000 mg | INTRAVENOUS | Status: DC | PRN
  Administered 2015-01-21: 2 mg via INTRAVENOUS
  Filled 2015-01-21: qty 1

## 2015-01-21 MED ORDER — DEXTROSE-NACL 5-0.45 % IV SOLN
INTRAVENOUS | Status: DC
  Administered 2015-01-21: 14:00:00 via INTRAVENOUS

## 2015-01-21 MED ORDER — MORPHINE SULFATE (PF) 2 MG/ML IV SOLN
2.0000 mg | Freq: Once | INTRAVENOUS | Status: AC
  Administered 2015-01-21: 2 mg via INTRAVENOUS
  Filled 2015-01-21: qty 1

## 2015-01-21 MED ORDER — PAMIDRONATE DISODIUM 30 MG IV SOLR
INTRAVENOUS | Status: AC
  Filled 2015-01-21: qty 90

## 2015-01-21 MED ORDER — MORPHINE SULFATE (CONCENTRATE) 10 MG /0.5 ML PO SOLN
2.5000 mg | ORAL | Status: DC
  Administered 2015-01-21 – 2015-01-22 (×5): 2.6 mg via ORAL
  Filled 2015-01-21 (×5): qty 0.5

## 2015-01-21 NOTE — Progress Notes (Addendum)
PROGRESS NOTE  Brittany Clarke VFI:[id number] DOB: [birth year] DOA: 01/18/2015 PCP: [name], MD  Summary: 61 yow PMH stroke, maintained on warfarin, brought to ED for third time in less than 3 weeks; presented with FTT, no oral intake for several days. Found to have fever, code sepsis was called, treated with broad abx. Admitted for FTT, no clear source of infection. Other issues included high INR secondary to warfarin.    Previously admitted 8/12-8/20 for AMS, AKI, fever. AMS resolved, thought secondary to dehydration and AKI, also considered opiate effect and depression. Fever without a source with negative evaluation including BC, UC, CXR, LE venous doppler u/s, 2-d echocardiogram.    Admitted 8/22-8/26 for unresponsive state/acute encephalopathy, dehydration; family demanded transfer to [facility] but [facility] declined and patient was transferred to [facility], seen by neurology. ABG, ammonia, MRI brain unrevealing. Neurology conclusion dementia with delirum secondary secondary to alteration of sleep/wake cycle and change of location. Eventually improved, SNF recommended but family declined and pt went home with [facility]. Discharge meds included neurontin. No BZD or narcotics.   Assessment/Plan: 1. Fever, possible sepsis on admission with elevated lactic acid 2.13 >> WNL with fluids  Initial workup was unrevealing (CXR, U/a)  Low grade temp overrnight. Urine cultures and blood cultures show no growth to date. Doubt gallbladder pathology. May be atelectasis. 2. FTT/recurrent acute encephalopathy. Appears worse, suspect secondary to acute issues, possibly hypercalcemia. Third admission in two weeks.  Etiology unclear. Previously pain medication considered, but not on any since last discharge. Both recurrent episodes occurred in context of FTT with 72 hours of discharge. CT negative 8/12 and 8/22. MRI brain 8/23--no acute process. Recent EEG non-specific, evaluated by neurology on last admission.   3. Supratherapeutic INR on admission, warfarin-induced coagulopathy (dx: stroke),  INR >10 on admission. Now therapeutic s/p Vitamin K.  4. Anemia/thrombocytopenia. Concern for MDS. Anemia improved s/p transfusion thrombocytopenia has worsened.  Heme positive stool last admission, GI recommended no workup, treate hemorrhoids with Anusol. Anemia panel revealed a total iron of 31, TIBC 265, normal vitamin B12. TSH WNL. 5. Hypercalcemia. Worrisome for malignancy s/p pamidronate. Does have a history of breast cancer. 6. Elevated T bili, AST. No significant change. [place] with gallbladder sludge without evidence of infection, exam benign, significance unclear.  7. Dysphagia. Continue NPO.  8. DM type 2 with peripheral neuropathy, Charcot foot. Remains stable. 9. COPD, stable. 10. S/p right breast cancer 11. S/p CVA 2013 with right sided weakness on warfarin as an outpatient. 12. Grade 1 diastolic CHF 13. Depression? In chart 8/22 Dr. Wilson Singer documented family report that pt told daughter she was tired of living. 14. Enlarged bilateral level II cervical lymph nodes seen on MRI brain, unclear significance.   Appears very ill, worse today, prognosis guarded, survival to discharge in doubt. Suspect underlying malignancy resulting in anemia, thrombocytopenia and hypercalcemia. LFTs have been stable >2 weeks, exam is nonfocal and u/s RUQ nonspecific. Will ask for surgical opinion but doubt gallbladder pathology.   Discussed in detail with daughter [name] the medical options and poor prognosis as of now. She has decided to wait until the family is together later today for family discussion of medical decisions. For now she specifically requests hold on further transfusion, which is reasonable pending [place] discussion.  Will continue IV abx and IVF  Follow up with family today concerning palliative care discussion   Hold off on transfusion for now.  CBC and CMP in the morning.   ADDENDUM Care discussed  earlier today  with surgery Dr. Arnoldo Morale, recommended against intervention/surgery.  Discussed with Kirby Crigler and Quinn Axe this afternoon. I agree with DNR, comfort care and hospice home. Visited with multiple family members this evening including Lucy who agrees with hospice. Will continue to support.  Code Status: full DVT prophylaxis: SCDs Family Communication: Daughter [name] at bedside. There is no POA at the moment.  Disposition Plan: Pending goals of care.  Murray Hodgkins, MD  [facility]  Pager [phone] If 7PM-7AM, please contact night-coverage at [email or web address], password [facility] 01/21/2015, 10:26 AM  LOS: 3 days   Consultants:  PMT  Procedures:  Transfused 1 unit PRBC  Antibiotics:  Vancomycin 8/29>>8/30  HPI/Subjective Daughter [name] reports that she is not doing well, she was in pain last night and was unable to sleep until she was given pain medications.  She is still unable to communicate with the family.   Objective: Filed Vitals:   01/20/15 1846 01/20/15 1901 01/20/15 2200 01/21/15 0642  BP: 100/71 107/56 107/31 126/72  Pulse: 75 115 94 121  Temp: 99.8 F (37.7 C) 99.5 F (37.5 C) 100.2 F (37.9 C) 100.8 F (38.2 C)  TempSrc: Oral Axillary Axillary Axillary  Resp: 20 20 22 24   Height:      Weight:      SpO2: 100% 100% 100% 100%    Intake/Output Summary (Last 24 hours) at 01/21/15 1026 Last data filed at 01/21/15 0700  Gross per 24 hour  Intake    200 ml  Output    450 ml  Net   -250 ml     Filed Weights   01/18/15 1755 01/18/15 2240  Weight: 113.399 kg (250 lb) 99.5 kg (219 lb 5.7 oz)    Exam:  Febrile max temp 100.8 General: Appears ill, alerts immediately to voice and makes eye contact but does not speak or follow commands. Eyes: limited, exam unremarkable  SNK:[id number], exam unremarkable .  Cardiovascular: tachycardic regular rhythm. No m/r/g. 2+ BLE edema Telemetry: ST  Respiratory: CTAB. Mild increased respiratory  effort.No w/r/r. Abdomen: skin unremarkable, nd, no appreciable tenderness with palpation. Skin:  Bruising LLE and LUE. No petechiae noted  Musculoskeletal: Moves LUE and LLE but not to command.  Neurological: Limited, difficult to access  Foley catheter noted. Urine unremarkable  New data reviewed:  Sodium 150  Kidney function preserved  T.bili without significant change.2.3.  AST slightly high.  AP WNL  CBC Hgb improved s/p transfusion, 7.8.  Platelet slightly worse, 19.  INR 2.19  Pertinent data :  Lactic acid 2.13 >. 1.6  EEG 8/23 previous admission Interpretation: This EEG study is abnormal with moderately severe generalized continuous nonspecific slowing of cerebral activity. Triphasic sharp waves are most often seen with moderate to severe metabolic encephalopathy, particularly with hepatic and renal failure, but can be seen with other metabolic conditions as well. No evidence of epileptic activity was recorded.  CXR 8/29 IMPRESSION: Suboptimal inspiration accounts for bibasilar atelectasis, left greater than right. No acute cardiopulmonary disease otherwise.  Pending data:  BC  UC  Scheduled Meds: . insulin aspart  0-9 Units Subcutaneous Q6H  . montelukast  10 mg Oral QPM  . pamidronate  90 mg Intravenous Once  . piperacillin-tazobactam (ZOSYN)  IV  3.375 g Intravenous Q8H  . senna-docusate  2 tablet Oral BID  . Warfarin - Pharmacist Dosing Inpatient   Does not apply Q24H   Continuous Infusions: . dextrose 5 % and 0.9% NaCl 100 mL/hr at 01/19/15 1502    Principal Problem:  Thrombocytopenia Active Problems:   History of CVA (cerebrovascular accident)   Anemia   DM II (diabetes mellitus, type II), controlled   History of breast cancer   Charcot foot due to diabetes mellitus   Acute encephalopathy   Warfarin-induced coagulopathy   Failure to thrive in adult   Fever   Hypercalcemia   Elevated LFTs   Dysphagia   Time spent 35 minutes, >50%  counseling and coordination of care  .Sandi Raveling Leonie Green, acting as scribe, recorded this note contemporaneously in the presence of Dr. Melene Plan. Sarajane Jews, M.D. on 01/21/2015 .  I have reviewed the above documentation for accuracy and completeness, and I agree with the above. Murray Hodgkins, MD

## 2015-01-21 NOTE — Clinical Social Work Note (Signed)
Per palliative care, family requesting Hospice Home. CSW briefly discussed with children, but agreed to complete assessment tomorrow as family is overwhelmed/emotional right now. Family requested that Levada Dy be contacted by CSW in AM. Agreed to send referral to [facility] this afternoon. Support provided.   Benay Pike, [place]

## 2015-01-21 NOTE — Progress Notes (Signed)
CRITICAL VALUE ALERT  Critical value received:  Platelets  Date of notification:  01/21/15  Time of notification:  3557  Critical value read back:Yes.    Nurse who received alert:  Jackelyn Hoehn, RN  MD notified (1st page): Dr Sarajane Jews \  Time of first page: 0800  Responding MD:  Dr Sarajane Jews    Time MD responded: [phone]

## 2015-01-21 NOTE — Progress Notes (Signed)
Daily Progress Note   Patient Name: Brittany Clarke       Date: 01/21/2015 DOB: 1940/11/28  Age: 74 y.o. MRN#: [id number] Attending Physician: Samuella Cota, MD Primary Care Physician: [name], MD Admit Date: 01/18/2015  Reason for Consultation/Follow-up: Establishing goals of care and Psychosocial/spiritual support  Subjective: Brittany Clarke is resting quietly in bed.  Her family is at her bedside.  Daughters, Gennette Pac, and Lorre Nick, and her son, Marcello Moores.  Also present are her 2 sisters, and 2 grandsons. We talk about Brittany Clarke low blood counts and that this is probably related to bone marrow dysfunction.  We talk about testing and how it may be painful, but also that if we find cancer that Brittany Clarke is not strong enough for any treatments.  Family all nod in agreement.  I tell them that I wish I had better news, but that the medical team doesn't have more treatment options.  We discuss the realities of taking Brittany Clarke back to her home, how much effort and support this would demand.  We also talk about Hospice Home.  During our conversation Lorre Nick becomes emotional and has to leave the room.  Sister, Inez Catalina, states she feels the best option is Hospice home and Levada Dy agrees.  I ask Marcello Moores what are his thoughts and he states that he will care for her either place she goes.  Both Brittany Clarke sisters state this is the decision of the children, but that they support hospice home and will be there with their sister.  Gilmore Laroche is very tearful during this time, but she also agrees to the hospice home.  We discuss stopping antibiotics and IV fluids, and only doing what makes Brittany Clarke comfortable.  Levada Dy talks about how Mrs. Eddleman stopped talking to them yesterday after they allowed her to be stuck multiple times for an IV. Family is very tearful and gather around Brittany Clarke bed.     Interval Events: Brittany Clarke platelets have continued to drop.  She has developed a fever today.     Length of Stay: 3 days  Current Medications: Scheduled Meds:  . insulin aspart  0-9 Units Subcutaneous Q6H  . montelukast  10 mg Oral QPM  . piperacillin-tazobactam (ZOSYN)  IV  3.375 g Intravenous Q8H  . senna-docusate  2 tablet Oral BID  . Warfarin - Pharmacist Dosing Inpatient   Does not apply Q24H    Continuous Infusions: . dextrose 5 % and 0.45% NaCl 100 mL/hr at 01/21/15 1359    PRN Meds: acetaminophen **OR** acetaminophen, bisacodyl, morphine injection, ondansetron **OR** ondansetron (ZOFRAN) IV  Palliative Performance Scale: 20%     Vital Signs: BP 118/50 mmHg  Pulse 107  Temp(Src) 100.6 F (38.1 C) (Axillary)  Resp 24  Ht 5\' 6"  (1.676 m)  Wt 99.5 kg (219 lb 5.7 oz)  BMI 35.42 kg/m2  SpO2 99% SpO2: SpO2: 99 % O2 Device: O2 Device: Nasal Cannula O2 Flow Rate: O2 Flow Rate (L/min): 2.5 L/min  Intake/output summary:  Intake/Output Summary (Last 24 hours) at 01/21/15 1516 Last data filed at 01/21/15 0700  Gross per 24 hour  Intake    200 ml  Output    450 ml  Net   -250 ml   LBM:   Baseline Weight: Weight: 113.399 kg (250 lb) Most recent weight: Weight: 99.5 kg (219 lb 5.7 oz)  Physical Exam: Constitutional: Elderly, frail, obese. Appears ill, drowsy.  Resp: Even, but labored.  GI: Abd soft, obese, tender  Additional Data Reviewed: Recent Labs     01/19/15  0545  01/20/15  0630  01/21/15  0619  WBC  10.6*  7.7  8.6  HGB  7.5*  6.9*  7.8*  PLT  37*  29*  19*  NA  143   --   150*  BUN  18   --   16  CREATININE  0.59   --   0.69     Problem List:  Patient Active Problem List   Diagnosis Date Noted  . DNR (do not resuscitate) discussion   . Fever 01/20/2015  . Hypercalcemia 01/20/2015  . Elevated LFTs 01/20/2015  . Dysphagia 01/20/2015  . Palliative care encounter 01/20/2015  . Weakness generalized 01/20/2015  . Altered mental status   . Failure to thrive in adult 01/18/2015  . UTI (lower urinary tract  infection) 01/11/2015  . AKI (acute kidney injury) 01/08/2015  . Hypomagnesemia 01/06/2015  . Elevated INR 01/06/2015  . Warfarin-induced coagulopathy 01/06/2015  . Acute encephalopathy 01/04/2015  . FUO (fever of unknown origin) 01/04/2015  . Blood in stool   . Dehydration 01/01/2015  . Thrombocytopenia 12/28/2014  . Chronic anticoagulation 12/15/2014  . Osteopenia 12/02/2014  . Unspecified vitamin D deficiency 07/30/2013  . Right hemiplegia 06/25/2013  . Muscle weakness (generalized) 06/25/2013  . Need for prophylactic vaccination and inoculation against influenza 04/16/2013  . 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 Esophageal dysphagia 02/11/2013  . Rectal bleeding 01/24/2013  . Chronic constipation 12/02/2012  . Pedal edema 12/02/2012  . Otalgia of left ear 12/02/2012  . History of rectal bleeding 12/02/2012  . HLD (hyperlipidemia) 12/02/2012  . Dysuria 12/02/2012  . Pain in joint, ankle and foot 09/10/2012  . PNA (pneumonia) 06/10/2012  . CHF (congestive heart failure) 06/10/2012  . Morbid obesity 06/10/2012  . History of CVA (cerebrovascular accident) 06/10/2012  . Anemia 06/10/2012  . DDD (degenerative disc disease), lumbar 06/10/2012  . Gout 06/10/2012  . HTN (hypertension) 06/10/2012  . DM II (diabetes mellitus, type II), controlled 06/10/2012  . Urinary incontinence 06/10/2012  . History of breast cancer 06/10/2012  . COPD (chronic obstructive pulmonary disease) 06/10/2012     Palliative Care Assessment & Plan    Code Status:  DNR  Goals of Care:  Hospice Home/ Comfort care.   Symptom Management:  Morphine 2.5 mg PO/SL Q 4 hours scheduled, and morphine 2.5 mg PO/SL Q 2 hours PRN.  Palliative Prophylaxis:  Dulcolax suppository PR QD if unable to take PO.  Psycho-social/Spiritual:  Desire for further Chaplaincy support:  no, not at this time.    Prognosis: Hours - Days Discharge Planning: Hospice facility   Care plan was  discussed with nursing staff, SW, CM and Dr. Sarajane Jews.   Thank you for allowing the Palliative Medicine Team to assist in the care of this patient.   Time In:  1530 Time Out: 1630 Total Time  60 minutes  Prolonged Time Billed  no     Greater than 50%  of this time was spent counseling and coordinating care related to the above assessment and plan.   Drue Novel, NP  01/21/2015, 3:16 PM  Please contact Palliative Medicine Team phone at [phone] for questions and concerns.

## 2015-01-22 ENCOUNTER — Encounter: Payer: Self-pay | Admitting: Internal Medicine

## 2015-01-22 DIAGNOSIS — Z8673 Personal history of transient ischemic attack (TIA), and cerebral infarction without residual deficits: Secondary | ICD-10-CM

## 2015-01-22 DIAGNOSIS — D469 Myelodysplastic syndrome, unspecified: Principal | ICD-10-CM

## 2015-01-22 DIAGNOSIS — Z853 Personal history of malignant neoplasm of breast: Secondary | ICD-10-CM

## 2015-01-22 LAB — ERYTHROPOIETIN: ERYTHROPOIETIN: 22.9 m[IU]/mL — AB (ref 2.6–18.5)

## 2015-01-22 LAB — HEPATITIS PANEL, ACUTE
HCV Ab: <0.1 s/co ratio (ref 0.0–0.9)
HEP B C IGM: NEGATIVE
HEP B S AG: NEGATIVE
Hep A IgM: NEGATIVE

## 2015-01-22 LAB — CALCIUM, IONIZED: Calcium, Ionized, Serum: 6.9 mg/dL — ABNORMAL HIGH (ref 4.5–5.6)

## 2015-01-22 LAB — BETA 2 MICROGLOBULIN, SERUM: BETA 2 MICROGLOBULIN: 7.3 mg/L — AB (ref 0.6–2.4)

## 2015-01-22 LAB — HIV ANTIBODY (ROUTINE TESTING W REFLEX): HIV SCREEN 4TH GENERATION: NONREACTIVE

## 2015-01-22 LAB — HAPTOGLOBIN: Haptoglobin: 46 mg/dL (ref 34–200)

## 2015-01-22 MED ORDER — MORPHINE SULFATE (CONCENTRATE) 10 MG /0.5 ML PO SOLN: 2.5000 mg | ORAL | Status: DC

## 2015-01-22 MED ORDER — LORAZEPAM 0.5 MG PO TABS: 0.5000 mg | ORAL_TABLET | Freq: Four times a day (QID) | ORAL | Status: AC | PRN

## 2015-01-22 MED ORDER — MORPHINE SULFATE (CONCENTRATE) 10 MG/0.5ML PO SOLN: 2.5000 mg | ORAL | Status: AC | PRN

## 2015-01-22 MED ORDER — MORPHINE SULFATE (CONCENTRATE) 10 MG/0.5ML PO SOLN: 2.5000 mg | ORAL | Status: DC | PRN

## 2015-01-22 MED ORDER — MORPHINE SULFATE (CONCENTRATE) 10 MG /0.5 ML PO SOLN: 2.5000 mg | ORAL | Status: AC

## 2015-01-22 NOTE — Care Management Important Message (Signed)
Important Message  Patient Details  Name: Brittany Clarke MRN: [id number] Date of Birth: [birth year]   Medicare Important Message Given:  Yes-second notification given    Sherald Barge, RN 01/22/2015, 2:02 PM

## 2015-01-22 NOTE — Clinical Social Work Note (Signed)
Pt accepted at [facility] and bed is available today. Levada Dy agreeable and will complete paperwork. D/C summary faxed. Pt to transfer via [facility].   Benay Pike, [place]

## 2015-01-22 NOTE — Discharge Summary (Signed)
Physician Discharge Summary  Brittany Clarke MVE:[id number] DOB: 1940-09-05 DOA: 01/18/2015  PCP: [name], MD  Admit date: 01/18/2015 Discharge date: 01/22/2015  Recommendations for Outpatient Follow-up:  1. Hospice care   Discharge Diagnoses:  1. Suspected MDS 2. Anemia 3. Thrombocytopenia 4. Hypercalcemia 5. FTT/ recurrent acute encephalopathy suspect malignancy, hypercalcemia.  6. Fever, possible sepsis on admission (doubted) 7. Supratherapeutic INR 8. Elevated T bili, AST 9. Dysphagia 10. DM Type 2 11. S/p right breast cancer 12. S/p CVA   Discharge Condition: stable, but terminal, prognosis less than 1 week Disposition: Residential hospice  Diet recommendation: Regular   Filed Weights   01/18/15 1755 01/18/15 2240  Weight: 113.399 kg (250 lb) 99.5 kg (219 lb 5.7 oz)    History of present illness:  23 yow PMH stroke, maintained on warfarin, brought to ED for third time in less than 3 weeks; presented with FTT, no oral intake for several days. Found to have fever, code sepsis was called, treated with broad abx. Admitted for FTT, no clear source of infection. Other issues included high INR secondary to warfarin.   Hospital Course:  Admitted, but in absence of infection, antibiotics were stopped. Workup for infection was negative. Remained encephalopathic, unable to eat, poorly responsive. Anemia and thrombocytopenia worsened, was transfused 1 U PRBC and seen by hematology in consult. Also noted hypercalcemia, taken together with anemia/thrombocytopenia as well as h/o breast cancer, concerning for MDS, primary bone marrow process/malignancy. Given prognosis and poor functional response and general state, hematology did not recommend invasive workup, and hospice was primary recommendation. Hypercalcemia treated with pamidronate without effect on mental status. LFTs static and likely respresent underlying illness. Workup was negative for acute process. Discussed with  general surgery, who recommended no further evaluation or intervention, not thought to be contributory.   Remained NPO due to dysphagia. Despite pamidronate, transfusion PRBC and INR improved to therapeutic s/p Vitamin K condition did not improve and prognosis was felt to be termianl Of her family she has four children with no POA. Palliative medicine consulted. Discussed with the children in detail the issues of the current hospitalization and recent past admission. After discussing amongst themselves and counseling with palliative care team, they decided to pursue hospice with full comfort care and understand she is dying.   Individual issues as below:   1. Anemia/thrombocytopenia. Concern for MDS/primary bone marrow process. Anemia improved s/p transfusion but thrombocytopenia continued to trend down. Heme positive stool last admission, GI recommended no workup, treate hemorrhoids with Anusol. Anemia panel revealed a total iron of 31, TIBC 265, normal vitamin B12. TSH WNL. 2. Hypercalcemia. Worrisome for malignancy. S/p pamidronate. Does have a history of breast cancer. 3. FTT/recurrent acute encephalopathy. Continues to decline. Suspect underlying malignancy, hypercalcemia as driving force. Previously pain medication considered, but not on any since last discharge. Both recurrent episodes occurred in context of FTT with 72 hours of discharge. CT negative 8/12 and 8/22. MRI brain 8/23--no acute process. Recent EEG non-specific, evaluated by neurology on last admission.  4. Fever, possible sepsis on admission with elevated lactic acid 2.13 >> WNL with fluids. Urine culture no growth, final. Blood cultures show no growth to date. Further evaluation included LFTs and RUQ u/s, AXR. Discussed with general surgery, nonspecific findings, no intervention suggested, thought non-contributory. No evidence of infection.  5. Elevated T bili, AST. No significant change. [place] with gallbladder sludge without evidence  of infection, exam benign. 6. Supratherapeutic INR on admission, warfarin-induced coagulopathy (dx: stroke), INR >10 on  admission. Therapeutic s/p Vitamin K.  7. Dysphagia. Continue NPO.  8. DM type 2 with peripheral neuropathy, Charcot foot. Remains stable. 9. S/p right breast cancer 10. S/p CVA 2013 with right sided weakness on warfarin as an outpatient.  Consultants:  PMT  Hematology   General surgery telephone conversation  Procedures:  Transfused 1 unit PRBC  Antibiotics:  Vancomycin 8/29>>8/30  Discharge Instructions   Current Discharge Medication List    START taking these medications   Details  LORazepam (ATIVAN) 0.5 MG tablet Place 1 tablet (0.5 mg total) under the tongue every 6 (six) hours as needed for anxiety. Qty: 30 tablet, Refills: 0    !! Morphine Sulfate (MORPHINE CONCENTRATE) 10 mg / 0.5 ml concentrated solution Place 0.13 mLs (2.6 mg total) under the tongue every 4 (four) hours. Qty: 30 mL, Refills: 0    !! Morphine Sulfate (MORPHINE CONCENTRATE) 10 MG/0.5ML SOLN concentrated solution Place 0.13 mLs (2.6 mg total) under the tongue every 2 (two) hours as needed for severe pain. Qty: 15 mL, Refills: 0     !! - Potential duplicate medications found. Please discuss with provider.    STOP taking these medications     atorvastatin (LIPITOR) 40 MG tablet      gabapentin (NEURONTIN) 100 MG capsule      metoprolol tartrate (LOPRESSOR) 25 MG tablet      montelukast (SINGULAIR) 10 MG tablet      HYDROcodone-acetaminophen (NORCO) 7.5-325 MG per tablet        Allergies  Allergen Reactions  . Ultram [Tramadol] Shortness Of Breath    The results of significant diagnostics from this hospitalization (including imaging, microbiology, ancillary and laboratory) are listed below for reference.    Significant Diagnostic Studies:  Dg Chest Port 1 View  01/18/2015   CLINICAL DATA:  Low grade fever and shortness of breath, acute onset earlier today.  Code sepsis. Current history of diabetes, hypertension, CHF, asthma and COPD. Personal history of left breast cancer.  EXAM: PORTABLE CHEST - 1 VIEW  COMPARISON:  01/11/2015 and earlier.  FINDINGS: Suboptimal inspiration accounts for crowded bronchovascular markings diffusely and atelectasis in the bases, left greater than right, and accentuates the cardiac silhouette. Taking this into account, cardiac silhouette mildly enlarged, unchanged. Thoracic aorta tortuous and atherosclerotic, unchanged. Hilar and mediastinal contours otherwise unremarkable. Lungs otherwise clear. No confluent airspace consolidation. No visible pleural effusions. Surgical clips in the right axilla from prior node dissection.  IMPRESSION: Suboptimal inspiration accounts for bibasilar atelectasis, left greater than right. No acute cardiopulmonary disease otherwise.   Electronically Signed   By: Evangeline Dakin M.D.   On: 01/18/2015 18:11    Dg Abd Portable 1v  01/20/2015   CLINICAL DATA:  Abdominal pain.  Elevated LFTs.  EXAM: PORTABLE ABDOMEN - 1 VIEW  COMPARISON:  12/17/2014  FINDINGS: Bowel gas pattern is nonobstructed. Tortuous versus aneurysmal abdominal aorta. No evidence for organomegaly. Temperature probe in place overlying the region of the rectum. Degenerative changes are seen in the spine.  IMPRESSION: Nonobstructive bowel gas pattern.   Electronically Signed   By: Nolon Nations M.D.   On: 01/20/2015 14:31   US Abdomen Limited Ruq  01/20/2015   CLINICAL DATA:  Elevated liver function tests  EXAM: US ABDOMEN LIMITED - RIGHT UPPER QUADRANT  COMPARISON:  None.  FINDINGS: Gallbladder:  Large volume of sludge. Numerous small gallstones. The largest appears to measure about 4 mm. Gallbladder wall thickness measures primarily 2 mm throughout most of the gallbladder but there  is a portion of the gallbladder wall but measures about 5 mm.No sonographic Murphy sign.  Common bile duct:  Diameter: 4.5 mm  Liver:  Mildly increased  echogenicity diffusely with no focal abnormalities. No biliary dilatation.  IMPRESSION: 1. Abundant sludge within the gallbladder. Small calculi. Areas of focal wall thickening may reflect areas of sludge adherent to the gallbladder wall or inflammation. No Murphy sign. 2. Mild hepatic steatosis.   Electronically Signed   By: Skipper Cliche M.D.   On: 01/20/2015 10:22    Microbiology: Recent Results (from the past 240 hour(s))  Blood Culture (routine x 2)     Status: None (Preliminary result)   Collection Time: 01/18/15  6:00 PM  Result Value Ref Range Status   Specimen Description BLOOD LEFT HAND DRAWN BY RN  Final   Special Requests BOTTLES DRAWN AEROBIC ONLY 4CC  Final   Culture NO GROWTH 4 DAYS  Final   Report Status PENDING  Incomplete  Blood Culture (routine x 2)     Status: None (Preliminary result)   Collection Time: 01/18/15  6:10 PM  Result Value Ref Range Status   Specimen Description BLOOD LEFT ANTECUBITAL  Final   Special Requests BOTTLES DRAWN AEROBIC ONLY 6CC  Final   Culture NO GROWTH 4 DAYS  Final   Report Status PENDING  Incomplete  Urine culture     Status: None   Collection Time: 01/18/15  6:56 PM  Result Value Ref Range Status   Specimen Description URINE, CATHETERIZED  Final   Special Requests NONE  Final   Culture   Final    NO GROWTH 1 DAY Performed at [facility]    Report Status 01/20/2015 FINAL  Final     Labs: Basic Metabolic Panel:  Recent Labs Lab 01/18/15 1945 01/18/15 1949 01/19/15 0545 01/21/15 0619  NA 142 143 143 150*  K 3.4* 3.4* 3.4* 3.4*  CL 113* 109 112* 119*  CO2 25  --  25 25  GLUCOSE 121* 119* 114* 108*  BUN 20 18 18 16   CREATININE 0.60 0.60 0.59 0.69  CALCIUM 10.5*  --  10.9* 11.0*   Liver Function Tests:  Recent Labs Lab 01/18/15 1945 01/19/15 0545 01/21/15 0619  AST 44* 52* 67*  ALT 13* 15 19  ALKPHOS 58 67 76  BILITOT 2.4* 2.4* 2.3*  PROT 4.8* 5.1* 4.8*  ALBUMIN 2.4* 2.4* 2.2*  CBC:  Recent  Labs Lab 01/18/15 1750 01/18/15 1945 01/18/15 1949 01/19/15 0545 01/20/15 0630 01/21/15 0619  WBC 4.3 8.3  --  10.6* 7.7 8.6  NEUTROABS 2.1 4.1  --   --   --   --   HGB 3.5* 7.1* 7.8* 7.5* 6.9* 7.8*  HCT 11.5* 22.4* 23.0* 23.9* 22.1* 24.7*  MCV 86.5 86.2  --  86.9 89.1 89.5  PLT 20* 40*  --  37* 29* 19*   CBG:  Recent Labs Lab 01/20/15 1117 01/20/15 1608 01/20/15 2207 01/21/15 0249 01/21/15 1153  GLUCAP 118* 95 89 96 101*    Principal Problem:   MDS (myelodysplastic syndrome) Active Problems:   History of CVA (cerebrovascular accident)   Anemia   DM II (diabetes mellitus, type II), controlled   History of breast cancer   Charcot foot due to diabetes mellitus   Thrombocytopenia   Acute encephalopathy   Warfarin-induced coagulopathy   Failure to thrive in adult   Fever   Hypercalcemia   Elevated LFTs   Dysphagia   DNR (do not resuscitate) discussion  Time coordinating discharge: 30 minutes   Signed:  Murray Hodgkins, MD [facility] 01/22/2015, 7:10 AM   I, Laban Emperor. Leonie Green, acting as scribe, recorded this note contemporaneously in the presence of Dr. Melene Plan. Sarajane Jews, M.D. on 01/22/2015 .  I have reviewed the above documentation for accuracy and completeness, and I agree with the above. Murray Hodgkins, MD

## 2015-01-22 NOTE — Progress Notes (Signed)
Oneita Kras discharged to [facility] per MD order.  Report called to receiving nurse, Nat Math, RN at [phone].     Medication List    STOP taking these medications        atorvastatin 40 MG tablet  Commonly known as:  LIPITOR     gabapentin 100 MG capsule  Commonly known as:  NEURONTIN     metoprolol tartrate 25 MG tablet  Commonly known as:  LOPRESSOR     montelukast 10 MG tablet  Commonly known as:  SINGULAIR      TAKE these medications        LORazepam 0.5 MG tablet  Commonly known as:  ATIVAN  Place 1 tablet (0.5 mg total) under the tongue every 6 (six) hours as needed for anxiety.     morphine CONCENTRATE 10 mg / 0.5 ml concentrated solution  Place 0.13 mLs (2.6 mg total) under the tongue every 4 (four) hours.     morphine CONCENTRATE 10 MG/0.5ML Soln concentrated solution  Place 0.13 mLs (2.6 mg total) under the tongue every 2 (two) hours as needed for severe pain.        Patients skin is clean, dry and intact, no evidence of skin break down. IV site and Foley catheter remained inserted per Hospice Home request. Patient transported on a stretcher by non emergent EMS,  no distress noted upon discharge.  Polly Cobia 01/22/2015 3:37 PM

## 2015-01-22 NOTE — Clinical Social Work Note (Signed)
Clinical Social Work Assessment  Patient Details  Name: Brittany Clarke MRN: [id number] Date of Birth: 01/09/1941  Date of referral:  01/22/15               Reason for consult:  End of Life/Hospice                Permission sought to share information with:  Family Supports Permission granted to share information::  Yes, Verbal Permission Granted  Name::     Brittany Clarke::     Relationship::  daughter  Contact Information:     Housing/Transportation Living arrangements for the past 2 months:  Brittany Clarke of Information:  Adult Children Patient Interpreter Needed:  None Criminal Activity/Legal Involvement Pertinent to Current Situation/Hospitalization:  No - Comment as needed Significant Relationships:  Adult Children Lives with:  Adult Children Do you feel safe going back to the place where you live?  No Need for family participation in patient care:  Yes (Comment)  Care giving concerns:  Pt requires total care. End of life.    Social Worker assessment / plan:  CSW met with pt's daughter, Brittany Clarke at bedside. Family designated Brittany Clarke as contact person yesterday. Brittany Clarke reports pt had been at [facility] for 20 days to see if therapy helped. She indicates that pt did not improve much and they took her home from hospital last week. Family has been staying around the clock with pt at home. Pt has continued to decline this hospital stay and conversation started regarding goals of care. Yesterday, they met with palliative care and agreed to DNR/comfort care and requested Hospice Home. 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 Hospice to meet with Brittany Clarke this morning.   Employment status:  Retired Nurse, adult PT Recommendations:  Not assessed at this time Information / Referral to community resources:  Other (Comment Required) (Hospice)  Patient/Family's Response to care:   Brittany Clarke indicates family is more accepting this morning and agree to [facility].   Patient/Family's Understanding of and Emotional Response to Diagnosis, Current Treatment, and Prognosis:  Pt's family had meeting with palliative care yesterday. Brittany Clarke states that she and her brother were anticipating this more as they have been primary caregivers. The discussion was more difficult for their two siblings and they were very emotional yesterday. Support provided.   Emotional Assessment Appearance:  Appears older than stated age Attitude/Demeanor/Rapport:  Unable to Assess Affect (typically observed):  Unable to Assess Orientation:    Alcohol / Substance use:  Not Applicable Psych involvement (Current and /or in the community):  No (Comment)  Discharge Needs  Concerns to be addressed:  Grief and Loss Concerns Readmission within the last 30 days:  Yes Current discharge risk:  Terminally ill Barriers to Discharge:  No Barriers Identified   Salome Arnt, [place] 01/22/2015, 9:12 AM [phone]

## 2015-01-22 NOTE — Progress Notes (Signed)
PROGRESS NOTE  Brittany Clarke YPP:[id number] DOB: [birth year] DOA: 01/18/2015 PCP: [name], MD  Summary: 43 yow PMH stroke, maintained on warfarin, brought to ED for third time in less than 3 weeks; presented with FTT, no oral intake for several days. Found to have fever, code sepsis was called, treated with broad abx. Admitted for FTT, no clear source of infection. Other issues included high INR secondary to warfarin.    Previously admitted 8/12-8/20 for AMS, AKI, fever. AMS resolved, thought secondary to dehydration and AKI, also considered opiate effect and depression. Fever without a source with negative evaluation including BC, UC, CXR, LE venous doppler u/s, 2-d echocardiogram.    Admitted 8/22-8/26 for unresponsive state/acute encephalopathy, dehydration; family demanded transfer to [facility] but [place] [facility] declined and patient was transferred to [facility], seen by neurology. ABG, ammonia, MRI brain unrevealing. Neurology conclusion dementia with delirum secondary secondary to alteration of sleep/wake cycle and change of location. Eventually improved, SNF recommended but family declined and pt went [facility] with [facility]. Discharge meds included neurontin. No BZD or narcotics.   Assessment/Plan: 1. Anemia/thrombocytopenia. Concern for MDS/primary bone marrow process. Anemia improved s/p transfusion but thrombocytopenia continued to trend down.  Heme positive stool last admission, GI recommended no workup, treate hemorrhoids with Anusol. Anemia panel revealed a total iron of 31, TIBC 265, normal vitamin B12. TSH WNL. 2. Hypercalcemia. Worrisome for malignancy. S/p pamidronate. Does have a history of breast cancer. 3. FTT/recurrent acute encephalopathy. Continues to decline. Suspect underlying malignancy, hypercalcemia as driving force. Previously pain medication considered, but not on any since last discharge. Both recurrent episodes occurred in context of FTT with 72 hours of discharge. CT negative 8/12  and 8/22. MRI brain 8/23--no acute process. Recent EEG non-specific, evaluated by neurology on last admission.  4. Fever, possible sepsis on admission with elevated lactic acid 2.13 >> WNL with fluids. Urine culture no growth, final. Blood cultures show no growth to date. Further evaluation included LFTs and RUQ u/s, AXR. Discussed with general surgery, nonspecific findings, no intervention suggested, thought non-contributory.  5. Elevated T bili, AST. No significant change. [place] with gallbladder sludge without evidence of infection, exam benign, significance unclear.  6. Supratherapeutic INR on admission, warfarin-induced coagulopathy (dx: stroke),  INR >10 on admission. Therapeutic s/p Vitamin K.  7. Dysphagia. Continue NPO.  8. DM type 2 with peripheral neuropathy, Charcot foot. Remains stable. 9. S/p right breast cancer 10. S/p CVA 2013 with right sided weakness on warfarin as an outpatient.   Discussed with PMT and hematology yesterday, agree with full comfort care, DNR, hospice. Discussed with family last night and with Brittany Clarke (daughter) this AM who agrees.  Slowly worsening, suspect prognosis less than one week. Discussed with Brittany Clarke at bedside, transfer to hospice facility today.   Code Status: DNR DVT prophylaxis: SCDs Family Communication: Daughter Brittany Clarke at bedside. There is no POA at the moment.  Disposition Plan: Per family discharge to residential hospice  Murray Hodgkins, MD  [facility]  Pager [phone] If 7PM-7AM, please contact night-coverage at [email or web address], password [email or web address] 01/22/2015, 7:00 AM  LOS: 4 days   Consultants:  PMT  Hematology   General surgery telephone conversation  Procedures:  Transfused 1 unit PRBC  Antibiotics:  Vancomycin 8/29>>8/30  HPI/Subjective Daughter reports that she was restless last night, pain was difficult to control. Not responsive or interactive with family. Brittany Clarke and patient's sister at bedside.  Objective: Filed  Vitals:   01/20/15 1901 01/20/15 2200 01/21/15 0642 01/21/15 1506  BP: 107/56 107/31  126/72 118/50  Pulse: 115 94 121 107  Temp: 99.5 F (37.5 C) 100.2 F (37.9 C) 100.8 F (38.2 C) 100.6 F (38.1 C)  TempSrc: Axillary Axillary Axillary Axillary  Resp: 20 22 24 24   Height:      Weight:      SpO2: 100% 100% 100% 99%    Intake/Output Summary (Last 24 hours) at 01/22/15 0700 Last data filed at 01/22/15 0538  Gross per 24 hour  Intake      0 ml  Output    750 ml  Net   -750 ml     Filed Weights   01/18/15 1755 01/18/15 2240  Weight: 113.399 kg (250 lb) 99.5 kg (219 lb 5.7 oz)    Exam:  Febrile max temp 100.6 General: Appears ill, lying in bed Cardiovascular: tachycardic regular rhythm. No m/r/g. 2+ BLE edema Respiratory: CTAB. Increased respiratory effort.No w/r/r. Psychiatric: Does not respond to gentle touch or voice Foley catheter noted.    New data reviewed:  No new data  Pertinent data :  Lactic acid 2.13 >. 1.6  EEG 8/23 previous admission Interpretation: This EEG study is abnormal with moderately severe generalized continuous nonspecific slowing of cerebral activity. Triphasic sharp waves are most often seen with moderate to severe metabolic encephalopathy, particularly with hepatic and renal failure, but can be seen with other metabolic conditions as well. No evidence of epileptic activity was recorded.  CXR 8/29 IMPRESSION: Suboptimal inspiration accounts for bibasilar atelectasis, left greater than right. No acute cardiopulmonary disease otherwise.  Pending data:  BC  UC  Scheduled Meds: . morphine CONCENTRATE  2.6 mg Oral Q4H   Continuous Infusions:    Principal Problem:   Thrombocytopenia Active Problems:   History of CVA (cerebrovascular accident)   Anemia   DM II (diabetes mellitus, type II), controlled   History of breast cancer   Charcot foot due to diabetes mellitus   Acute encephalopathy   Warfarin-induced coagulopathy    Failure to thrive in adult   Fever   Hypercalcemia   Elevated LFTs   Dysphagia   DNR (do not resuscitate) discussion    .Sandi Raveling Leonie Green, acting as scribe, recorded this note contemporaneously in the presence of Dr. Melene Plan. Sarajane Jews, M.D. on 01/22/2015 .  I have reviewed the above documentation for accuracy and completeness, and I agree with the above. Murray Hodgkins, MD

## 2015-01-22 NOTE — Care Management Note (Signed)
Case Management Note  Patient Details  Name: Brittany Clarke MRN: [id number] Date of Birth: 04/03/1941  Expected Discharge Date:                  Expected Discharge Plan:  [place]  In-House Referral:  Hospice / Palliative Care, Clinical Social Work  Discharge planning Services  CM Consult  Post Acute Care Choice:  Hospice Choice offered to:  Adult Children  DME Arranged:    DME Agency:     HH Arranged:    HH Agency:     Status of Service:  Completed, signed off  Medicare Important Message Given:  Yes-second notification given Date Medicare IM Given:    Medicare IM give by:    Date Additional Medicare IM Given:    Additional Medicare Important Message give by:     If discussed at [place] of Stay Meetings, dates discussed:    Additional Comments: Pt discharging today to [facility]. CSW has arranged for placement. [facility] has been notified of DC plan. No CM needs.  Sherald Barge, RN 01/22/2015, 2:03 PM

## 2015-01-23 ENCOUNTER — Encounter: Payer: Self-pay | Admitting: Internal Medicine

## 2015-01-23 DIAGNOSIS — R5383 Other fatigue: Secondary | ICD-10-CM | POA: Insufficient documentation

## 2015-01-23 DIAGNOSIS — N289 Disorder of kidney and ureter, unspecified: Secondary | ICD-10-CM | POA: Insufficient documentation

## 2015-01-23 LAB — CULTURE, BLOOD (ROUTINE X 2)
CULTURE: NO GROWTH
Culture: NO GROWTH

## 2015-01-24 LAB — TYPE AND SCREEN
ABO/RH(D): O NEG
Antibody Screen: NEGATIVE
Unit division: 0
Unit division: 0

## 2015-02-04 ENCOUNTER — Encounter: Payer: Self-pay | Admitting: Pharmacist

## 2015-02-20 NOTE — Progress Notes (Signed)
Patient ID: Brittany Clarke, female   DOB: 1940/07/28, 74 y.o.   MRN: [id number]       the date is 01/01/2015  This is an acute visit.  Level care skilled.  Facility [facility].  Chief complaint-acute visit follow-up supratherapeutic INR with-anticoagulation management on Coumadin with history CVA--follow-up anemia thrombocytopenia--lethargy-renal insufficiency-.  History of present illness.  Patient is a pleasant 74 year old female recently admitted here from home apparently secondary to weakness.  She also has a history of anemia apparently with a history of an occult positive stool-this was evaluated by her primary care provider before admission here .  Iron studies showed iron level XLIV total iron-binding capacity 300 B12 was 348 folate was 14 ferritin was elevated at 473.   Hemoglobin appears to run around the 10 range she also has thrombo cytopenia with platelets ranging from 93 up 237,000 recently it appears.- were actually 75,000 on the lab done earlier this week--it has risen up somewhat to 87,000 on lab done today her hemoglobin is relatively stable at 9.2  We have been monitoring her INR it was significantly elevated at 7.12 back on August 8 and actually rose the next day up to 7.99 she did receive vitamin K and  it came down to 1.52--her Coumadin was restarted 2 days ago at 3 mg a day and INR is 2.19 today.  Metabolic panel does show increased renal insufficiency however with the creatinine 1.69 BUN of 57 which has risen fairly significantly from earlier this week when creatinine was 0.5-.  According to her family the last day or 2 she has not been herself has been more lethargic apparently not eating and drinking as well and this appears to be the case today when I saw her she was responsive and alert but did not seem to be herself somewhat somnolent slower to respond  Do note she is also has a history of CHF with an ejection fraction 40-45 percent and grade 1 diastolic  dysfunction-she is on Lasix every other day.  . Her vital signs appear to be stable but family is quite concerned about her lethargic status     .    Family medical social history has been reviewed per primary care provider note on 09/22/2014.  Medications have been reviewed per MAR.  They include.  Allopurinol 300 mg daily.  Atorvastatin 40 mg daily.  Baclofen 5 mg 3 times a day.  Claritin 10 mg daily.  Cozaar 25 mg daily.  Vicodin 7.5/325 milligrams twice a day when necessary.  Lasix 20 mg every other day.  Glucophage 5 mg twice a day.  Metoprolol 12.5 mg twice a day.  Neurontin 100 mg twice a day and 400 mg daily at bedtime.  Singulair  10 mg daily.  Review of systems. This is limited secondary to lethargy-however she is not complaining of any chest pain or shortness of breath-does not complain of fever or chills.      .  Physical exam.  Temperature 97.9 pulse 62 respirations 18 blood pressure 111/58  In general this is an obese white female in no distress--she is responsive however appears to be quite lethargic and gives short responses to answers and then appears to want to fall sleep.    Her skin is warm and dryt I do not any note any new increased bruising.  Eyes sclera and conjunctiva are clear pupils appear reactive to light visual acuity appears grossly intact  Chest is clear to auscultation there is no labored breathing.  Heart is regular rate and rhythm without murmur gallop or rub appears to have baseline lower extremity edema venous stasis changes bilaterally.  Her abdomen is obese soft nontender with slightly hypoactive bowel sounds this could be due to obesity.  Muscle skeletal has right-sided weakness with some contracture of her right hand this apparently is baseline left extremity strength appears intact she is able to move certainly her right extremities but again has weakness.  Neurologic as stated above her cranial nerves are  intact speech is clear is able to move all extremities however is lethargic.  Psych she is lethargic but arousable is able to speak but is not talking much  .Labs  01/01/2015.  Sodium 134 potassium 3.9 BUN 57 creatinine 1.69.  Bilirubin 1.3 albumin 3.1 otherwise liver function tests within normal limits.  INR 2.19.  WBC 6 hemoglobin 9.2 platelets 87,000.  Marland Kitchen  12/30/2014.  INR 1.5 to.  12/29/2014 7.99.  12/28/2014 7.12  Labs onAugust eighth 2016.  INR 7.12. 12/28/2014  hemoglobin of 9.9 platelets of 75,000 white count of 5.3.  Sodium 138 potassium 4.2 BUN 27 creatinine 0.82-albumin 3.2 otherwise liver function tests grossly within normal limits bilirubin is minimally elevated at 1.3  .  12/16/2014.  WBC 5.3 hemoglobin 10.6 platelets 126.   12/15/2014.  Sodium 141 potassium 3.9 BUN 11 creatinine 0.55.    INR 3.86  12/11/2014.  WBC 5.0 hemoglobin 10.3 platelets 107.  Total iron-binding capacity 300--iron 44--ferritin 473--B12 348--folate 14--  Assessment and plan   1 lethargy somnolence-this is a new presentation from when I saw her earlier this week-family states this has been going on for the last day or so-apparently not eating and drinking very well this appears to be a change in status-I note her creatinine has gone up as well from her baseline-her vital signs are stable-family is quite concerned however and would like her sent to the ER for evaluation and will do this-I suspect she has a element of acute renal insufficiency here- .   2-history CVA on chronic anticoagulation with Coumadin  INR after being supratherapeutic is now in normal range-this appears to be quite variable one would consider possibly stopping Coumadin at some point it's been quite difficult to manage .     #3-anemia--r thrombocytopenia-this was discussed with Dr. Frances Furbish is some suspicion there may be an element of myelodysplastic syndrome-Platelts now at 87,000  appears to have stabilized hemoglobin is 9.2 which is at the lower end of her recent baseline   Of note I did reevaluate patient per serial exams before EMS arrived she remained stable although somnolent she was arousable    [id number]  .   Marland Kitchen

## 2015-02-20 DEATH — deceased

## 2015-07-09 ENCOUNTER — Encounter (HOSPITAL_COMMUNITY): Payer: Self-pay

## 2016-01-23 IMAGING — CT CT HEAD W/O CM
1 of 2 series · 15 of 30 positions shown, 19 images · non-contrast
Comparison: 08/01/2014

CLINICAL DATA: Altered mental status, unconsciousness, recent UTI.

EXAM:
CT HEAD WITHOUT CONTRAST
TECHNIQUE: Contiguous axial images were obtained from the base of the skull
through the vertex without intravenous contrast.

[Series 2: headtrauma 4.8 h37s · axial · 0.43mm/px · z∈[+63,+218]mm · 15 of 36 slices shown, 19 images]
[im 2/36  brain]
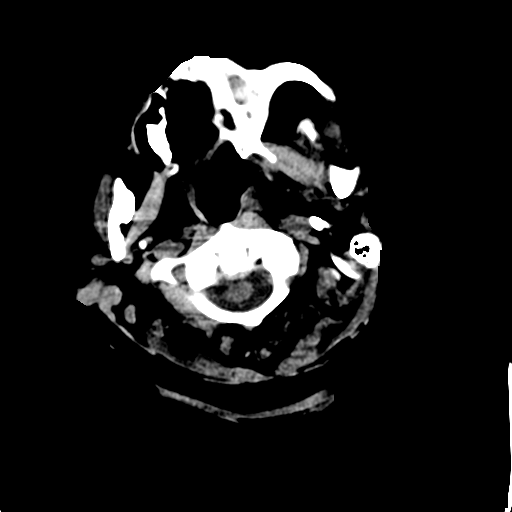
[im 2/36  bone]
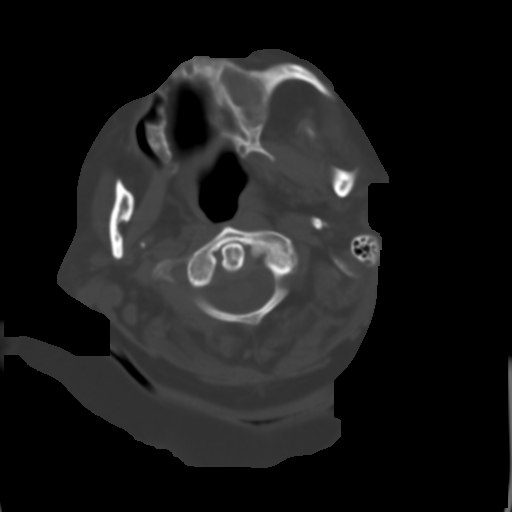
[im 4/36  brain]
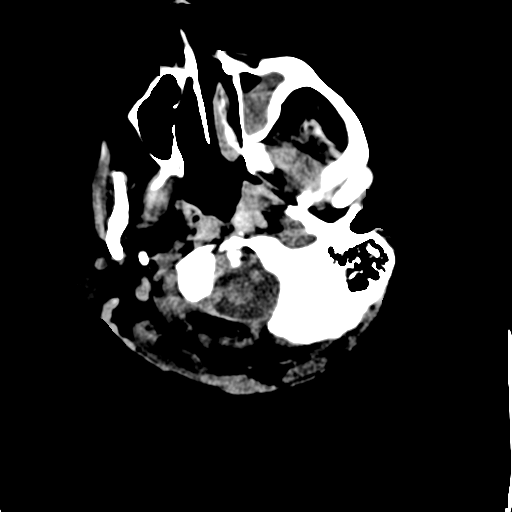
[im 8/36  brain]
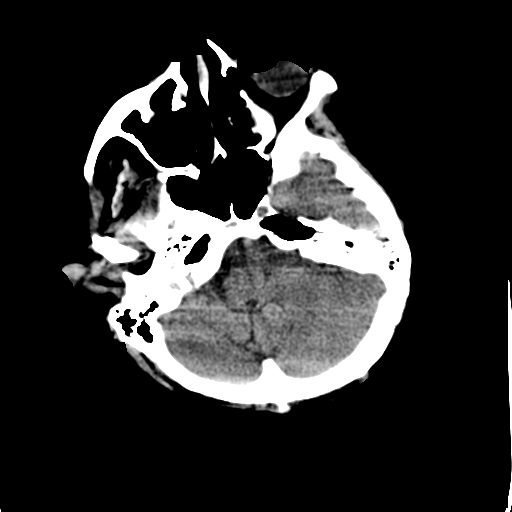
[im 10/36  brain]
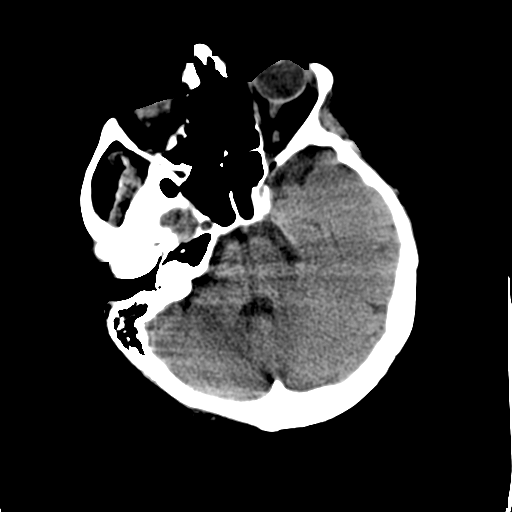
[im 12/36  brain]
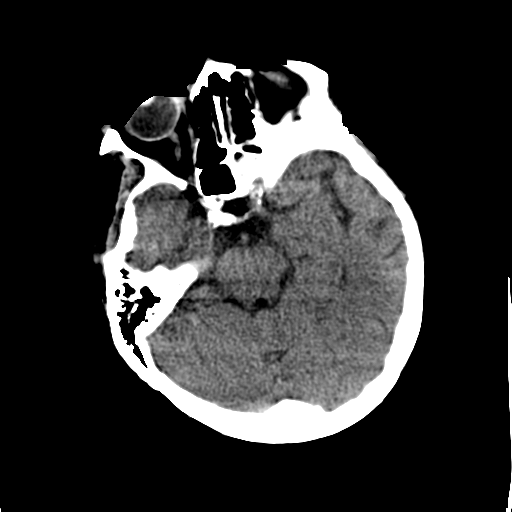
[im 12/36  bone]
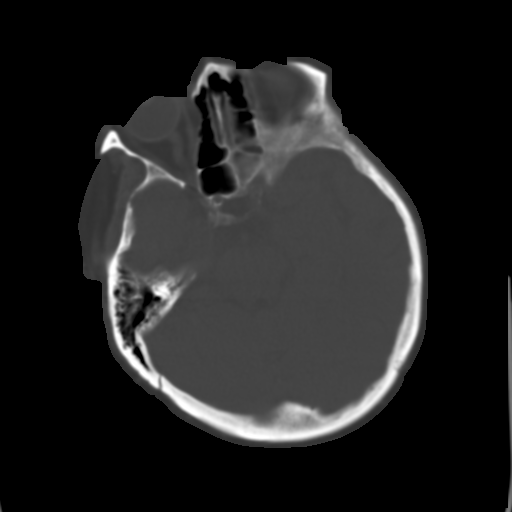
[im 13/36  brain]
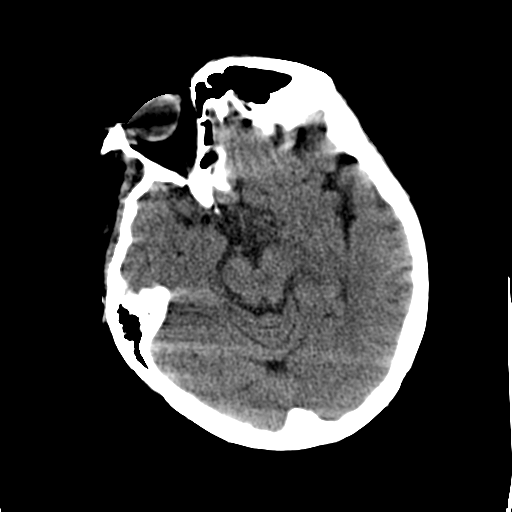
[im 15/36  brain]
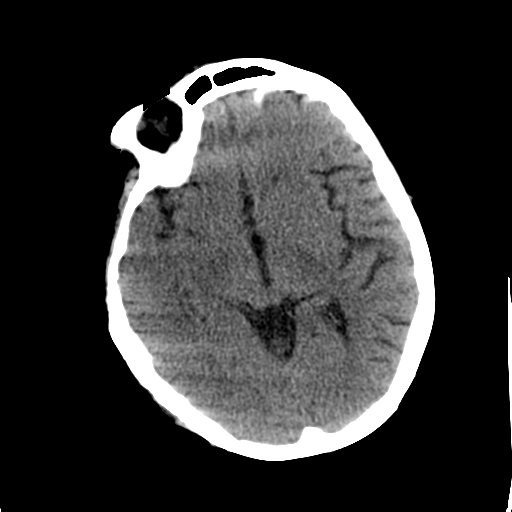
[im 19/36  brain]
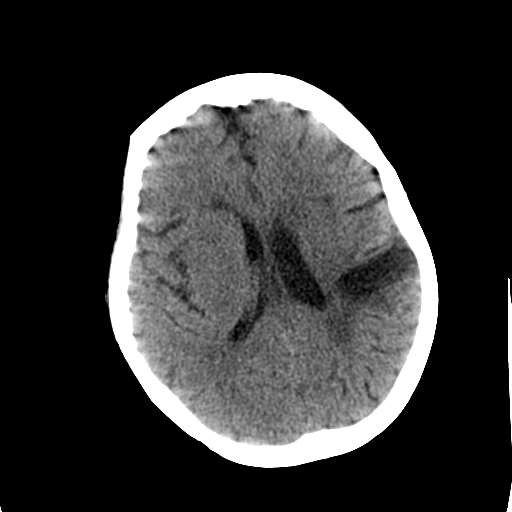
[im 21/36  brain]
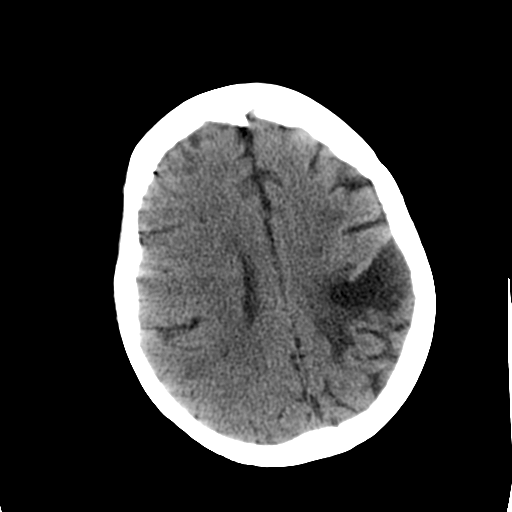
[im 21/36  bone]
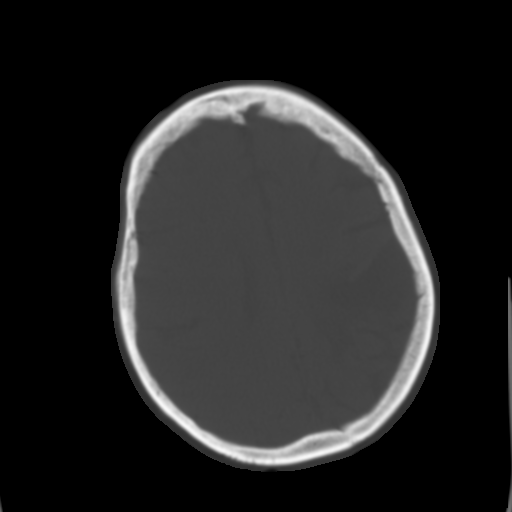
[im 23/36  brain]
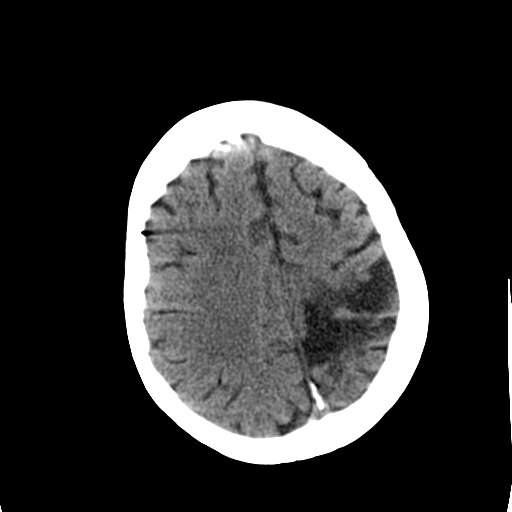
[im 24/36  brain]
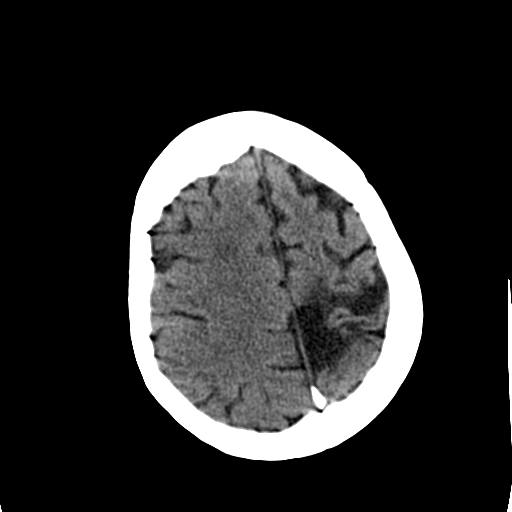
[im 26/36  brain]
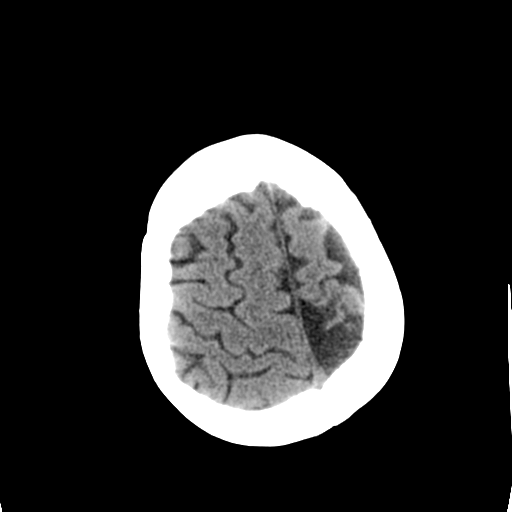
[im 30/36  brain]
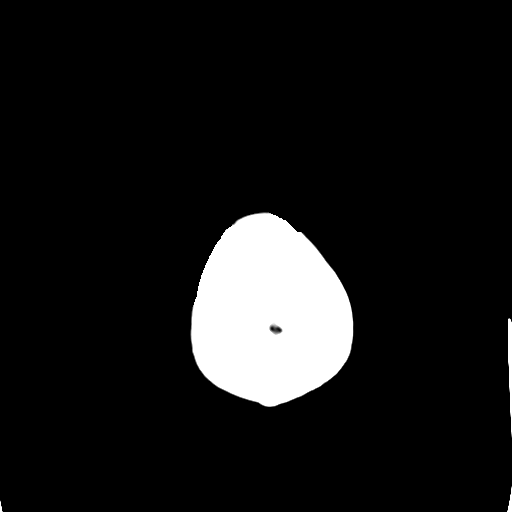
[im 30/36  bone]
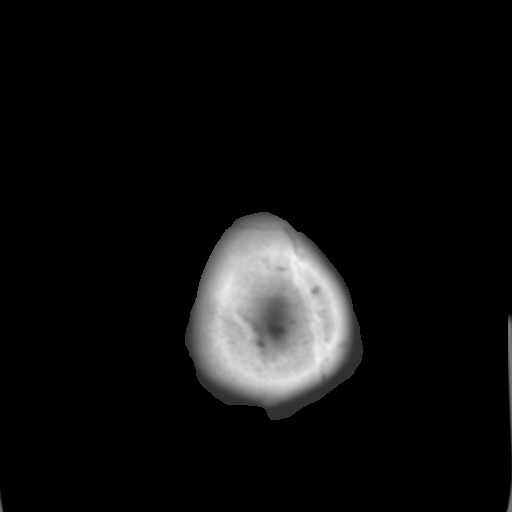
[im 32/36  brain]
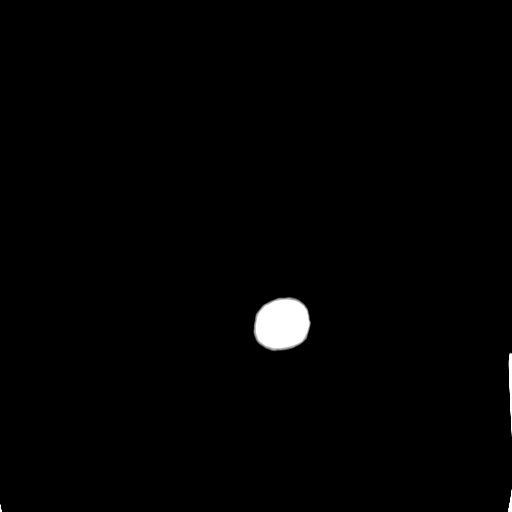
[im 34/36  brain]
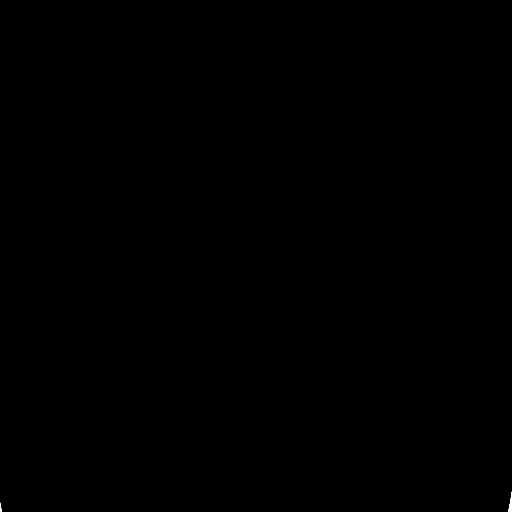

[15 of 30 positions shown; findings below may reference images not displayed]

FINDINGS: No mass effect or midline shift. No evidence of acute intracranial
hemorrhage, or infarction. No abnormal extra-axial fluid
collections. Basal cisterns are preserved. There are stable
extensive changes of encephalomalacia in the left frontoparietal
region, compatible with prior MCA distribution infarct. An area of
hypoattenuation posterior to the area of encephalomalacia in the
left parietal lobe is also stable, and may represent a more subacute
component. Periventricular small vessel white matter changes are
also stable. There has been a probable remote infarct in the right
basal ganglia.

No depressed skull fractures. Left maxillary sinus opacification is
again noted. The remainder of the paranasal sinuses and mastoid air
cells are normally aerated.
IMPRESSION: Extensive but stable changes in the left frontoparietal region,
likely from chronic left MCA infarct. A more subacute appearing
component posteriorly is also stable.

Probable right basal ganglia chronic infarct.

No evidence of acute intracranial hemorrhage or infarction.

Persistent opacification of the left maxillary sinus.
